# Patient Record
Sex: Female | Born: 1940
Health system: Southern US, Community
[De-identification: ages and names within clinical notes are randomized; demographics above are authoritative.]

## PROBLEM LIST (undated history)

## (undated) ENCOUNTER — Ambulatory Visit (HOSPITAL_COMMUNITY): Admission: EM | Payer: Medicare Other | Source: Home / Self Care

## (undated) DIAGNOSIS — I519 Heart disease, unspecified: Secondary | ICD-10-CM

## (undated) DIAGNOSIS — K219 Gastro-esophageal reflux disease without esophagitis: Secondary | ICD-10-CM

## (undated) DIAGNOSIS — E7800 Pure hypercholesterolemia, unspecified: Secondary | ICD-10-CM

## (undated) DIAGNOSIS — I7090 Unspecified atherosclerosis: Secondary | ICD-10-CM

## (undated) DIAGNOSIS — M1990 Unspecified osteoarthritis, unspecified site: Secondary | ICD-10-CM

## (undated) DIAGNOSIS — I10 Essential (primary) hypertension: Secondary | ICD-10-CM

## (undated) DIAGNOSIS — R748 Abnormal levels of other serum enzymes: Secondary | ICD-10-CM

## (undated) DIAGNOSIS — I4891 Unspecified atrial fibrillation: Secondary | ICD-10-CM

## (undated) DIAGNOSIS — F419 Anxiety disorder, unspecified: Secondary | ICD-10-CM

## (undated) DIAGNOSIS — I499 Cardiac arrhythmia, unspecified: Secondary | ICD-10-CM

## (undated) DIAGNOSIS — I219 Acute myocardial infarction, unspecified: Secondary | ICD-10-CM

## (undated) DIAGNOSIS — I2510 Atherosclerotic heart disease of native coronary artery without angina pectoris: Secondary | ICD-10-CM

## (undated) HISTORY — PX: CARDIAC SURGERY: SHX584

## (undated) HISTORY — DX: Unspecified osteoarthritis, unspecified site: M19.90

## (undated) HISTORY — PX: BACK SURGERY: SHX140

## (undated) HISTORY — DX: Anxiety disorder, unspecified: F41.9

## (undated) HISTORY — PX: ANGIOPLASTY: SHX39

## (undated) HISTORY — PX: NECK SURGERY: SHX720

## (undated) HISTORY — DX: Heart disease, unspecified: I51.9

## (undated) HISTORY — DX: Unspecified atherosclerosis: I70.90

---
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
## 1981-12-06 HISTORY — PX: ABDOMINAL HYSTERECTOMY: SHX81

## 1987-12-07 DIAGNOSIS — I209 Angina pectoris, unspecified: Secondary | ICD-10-CM

## 1987-12-07 HISTORY — DX: Angina pectoris, unspecified: I20.9

## 1998-04-01 ENCOUNTER — Ambulatory Visit (HOSPITAL_COMMUNITY): Admission: RE | Admit: 1998-04-01 | Discharge: 1998-04-01 | Payer: Self-pay | Admitting: Internal Medicine

## 1998-10-31 ENCOUNTER — Emergency Department (HOSPITAL_COMMUNITY): Admission: EM | Admit: 1998-10-31 | Discharge: 1998-10-31 | Payer: Self-pay | Admitting: Emergency Medicine

## 1998-10-31 ENCOUNTER — Encounter: Payer: Self-pay | Admitting: Emergency Medicine

## 1999-11-12 ENCOUNTER — Encounter: Payer: Self-pay | Admitting: Internal Medicine

## 1999-11-12 ENCOUNTER — Ambulatory Visit (HOSPITAL_COMMUNITY): Admission: RE | Admit: 1999-11-12 | Discharge: 1999-11-12 | Payer: Self-pay | Admitting: Internal Medicine

## 2001-04-04 ENCOUNTER — Encounter: Payer: Self-pay | Admitting: Obstetrics and Gynecology

## 2001-04-04 ENCOUNTER — Encounter: Admission: RE | Admit: 2001-04-04 | Discharge: 2001-04-04 | Payer: Self-pay | Admitting: Obstetrics and Gynecology

## 2001-07-10 ENCOUNTER — Ambulatory Visit (HOSPITAL_COMMUNITY): Admission: EM | Admit: 2001-07-10 | Discharge: 2001-07-10 | Payer: Self-pay | Admitting: Emergency Medicine

## 2001-07-10 ENCOUNTER — Encounter: Payer: Self-pay | Admitting: Emergency Medicine

## 2002-04-05 ENCOUNTER — Encounter: Admission: RE | Admit: 2002-04-05 | Discharge: 2002-04-05 | Payer: Self-pay | Admitting: Obstetrics and Gynecology

## 2002-04-05 ENCOUNTER — Encounter: Payer: Self-pay | Admitting: Obstetrics and Gynecology

## 2002-08-07 ENCOUNTER — Other Ambulatory Visit: Admission: RE | Admit: 2002-08-07 | Discharge: 2002-08-07 | Payer: Self-pay | Admitting: Obstetrics and Gynecology

## 2003-08-22 ENCOUNTER — Other Ambulatory Visit: Admission: RE | Admit: 2003-08-22 | Discharge: 2003-08-22 | Payer: Self-pay | Admitting: Obstetrics and Gynecology

## 2003-09-30 ENCOUNTER — Encounter: Admission: RE | Admit: 2003-09-30 | Discharge: 2003-09-30 | Payer: Self-pay | Admitting: Obstetrics and Gynecology

## 2003-09-30 ENCOUNTER — Encounter: Payer: Self-pay | Admitting: Obstetrics and Gynecology

## 2004-09-01 ENCOUNTER — Other Ambulatory Visit: Admission: RE | Admit: 2004-09-01 | Discharge: 2004-09-01 | Payer: Self-pay | Admitting: Obstetrics and Gynecology

## 2005-09-03 ENCOUNTER — Other Ambulatory Visit: Admission: RE | Admit: 2005-09-03 | Discharge: 2005-09-03 | Payer: Self-pay | Admitting: Obstetrics and Gynecology

## 2005-10-25 ENCOUNTER — Encounter: Admission: RE | Admit: 2005-10-25 | Discharge: 2005-10-25 | Payer: Self-pay | Admitting: Obstetrics and Gynecology

## 2006-09-29 ENCOUNTER — Ambulatory Visit (HOSPITAL_COMMUNITY): Admission: RE | Admit: 2006-09-29 | Discharge: 2006-09-29 | Payer: Self-pay | Admitting: Internal Medicine

## 2006-11-07 ENCOUNTER — Encounter: Admission: RE | Admit: 2006-11-07 | Discharge: 2006-11-07 | Payer: Self-pay | Admitting: Internal Medicine

## 2007-10-27 ENCOUNTER — Ambulatory Visit: Payer: Self-pay | Admitting: Psychology

## 2007-11-09 ENCOUNTER — Encounter: Admission: RE | Admit: 2007-11-09 | Discharge: 2007-11-09 | Payer: Self-pay | Admitting: Internal Medicine

## 2007-11-15 ENCOUNTER — Ambulatory Visit: Payer: Self-pay | Admitting: Psychology

## 2008-02-20 ENCOUNTER — Emergency Department (HOSPITAL_COMMUNITY): Admission: EM | Admit: 2008-02-20 | Discharge: 2008-02-20 | Payer: Self-pay | Admitting: Family Medicine

## 2008-09-30 ENCOUNTER — Ambulatory Visit: Payer: Self-pay | Admitting: Obstetrics and Gynecology

## 2008-09-30 ENCOUNTER — Other Ambulatory Visit: Admission: RE | Admit: 2008-09-30 | Discharge: 2008-09-30 | Payer: Self-pay | Admitting: Obstetrics and Gynecology

## 2008-09-30 ENCOUNTER — Encounter: Payer: Self-pay | Admitting: Obstetrics and Gynecology

## 2008-11-26 ENCOUNTER — Encounter: Admission: RE | Admit: 2008-11-26 | Discharge: 2008-11-26 | Payer: Self-pay | Admitting: Obstetrics and Gynecology

## 2009-10-13 ENCOUNTER — Ambulatory Visit: Payer: Self-pay | Admitting: Obstetrics and Gynecology

## 2009-11-21 DIAGNOSIS — K219 Gastro-esophageal reflux disease without esophagitis: Secondary | ICD-10-CM | POA: Insufficient documentation

## 2009-11-21 DIAGNOSIS — F430 Acute stress reaction: Secondary | ICD-10-CM | POA: Insufficient documentation

## 2009-11-24 DIAGNOSIS — G4700 Insomnia, unspecified: Secondary | ICD-10-CM | POA: Insufficient documentation

## 2010-12-27 ENCOUNTER — Encounter: Payer: Self-pay | Admitting: Internal Medicine

## 2011-04-23 NOTE — H&P (Signed)
[place]. [facility]  Patient:    Erica Frank, Erica Frank                         MRN: [id number] Adm. Date:  [id number] Attending:  Osvaldo Human CC:         Erica Frank., M.D.   History and Physical  CHIEF COMPLAINT: Erica Frank is a 70 year old female, with a history of coronary artery disease and hyperlipidemia.  She presents to the emergency room with an episode of chest pain.  HISTORY OF PRESENT ILLNESS: The patient has a history of coronary artery disease, status post PTCA approximately 12 years ago.  She has done fairly well over the years.  This evening around 10 p.m. she developed an episode of chest pain which was relieved with sublingual nitroglycerin.  She awoke again at 1 a.m. and had recurrent chest pain.  This time it took two nitroglycerin and produced only partial relief.  She presented to the emergency room for further evaluation.  The patient ate some hot peppers tonight, otherwise her diet has been unremarkable.  Here in the ER the patient was started on IV heparin and nitroglycerin with fairly good relief of her symptoms.  She denies any PND, orthopnea, or pleuritic chest pain.  CURRENT MEDICATIONS:  1. Lipitor 10 mg q.d.  2. Aspirin q.d.  3. Nitroglycerin as needed.  4. Estrace q.d.  5. Prilosec q.d.  ALLERGIES: No known drug allergies.  PAST MEDICAL HISTORY:  1. Coronary artery disease.  2. Hypercholesterolemia.  3. History of neck surgery due to degenerative joint disease.  4. History of gastroesophageal reflux.  5. Hiatal hernia.  SOCIAL HISTORY: The patient is a nonsmoker.  She drinks alcohol only rarely.  FAMILY HISTORY: Noncontributory.  PHYSICAL EXAMINATION:  GENERAL: She is a middle-aged female in no acute distress.  She is alert and oriented x 3 and her mood and affect at normal.  VITAL SIGNS: Blood pressure 131/77, heart rate 88.  HEENT/NECK: No JVD, 2+ carotids without bruits.  No  thyromegaly.  LUNGS: Clear to auscultation.  HEART: Regular rate, S1 and S2.  No gallops.  No chest wall tenderness.  ABDOMEN: Benign.  Good bowel sounds.  Nontender.  No hepatosplenomegaly.  She is only very minimally tender in her upper epigastrium.  EXTREMITIES: No calf tenderness.  No swelling.  No clubbing, cyanosis, or edema.  NEUROLOGIC: Cranial nerves 2-12 intact.  Motor and sensory function intact.  LABORATORY DATA: EKG reveals normal sinus rhythm, some T wave flattening in lead aVF; otherwise EKG normal.  Cardiac enzymes, CPK 145, MB 2.2.  Troponin less than 0.01.  IMPRESSION: Erica Frank presents with some rather atypical episodes of chest pain.  Her initial episode was relieved with sublingual nitroglycerin.  She currently is stable and does not have any acute electrocardiogram changes.  PLAN: We will admit her for observation.  We will continue the IV nitroglycerin and IV heparin.  We will collect cardiac enzymes.  Dr. Donnie Aho will see her in the morning.  All of her other medical problems are stable. DD:  07/10/01 TD:  07/10/01 Job: [id number]

## 2011-04-23 NOTE — Cardiovascular Report (Signed)
[place]. [facility]  Patient:    Erica Frank, Erica Frank                         MRN: [id number] Proc. Date: 07/10/01 Adm. Date:  [id number] Disc. Date: [id number] Attending:  Norman Clay CC:         Marinus Maw, M.D.   Cardiac Catheterization  HISTORY:  The patient is a 70 year old female with a history of known coronary disease, who presents with a chest pain syndrome consistent with unstable angina.  COMMENTS ABOUT PROCEDURE:  The patient tolerated the procedure well without complications and had good hemostasis and pedal pulses following the procedure.  Please see the attached catheterization log for the remainder of the details.  HEMODYNAMIC DATA:  Aorta post contrast 148/74, LV post contrast 148/13.  ANGIOGRAPHIC DATA:  LEFT VENTRICULOGRAM:  The left ventriculogram performed in the 30 degree RAO projection.  There was some catheter-induced ventricular ectopy and ventricular tachycardia.  The estimated EF was around 50% but was difficult. Coronary arteries arise and distribute normally.  Calcification is noted in the proximal left coronary artery.  The left main coronary artery is normal.  The left anterior descending is calcified proximally.  There is a mild 20-30% proximal stenosis but no severe focal obstructive stenoses are noted. The left anterior descending is a large vessel.  Circumflex coronary artery supplies a large intermediate or first marginal branch and a second marginal branch which is free of disease.  The right coronary artery has an enlarged posterior descending artery and a smaller continuation branch supplying two posterolateral branches which is free of disease.  IMPRESSION: 1. Mild coronary artery disease predominately involving the proximal    left anterior descending. 2. Normal left ventricular function. DD:  07/10/01 TD:  07/11/01 Job: [id number]

## 2011-09-11 ENCOUNTER — Emergency Department (HOSPITAL_COMMUNITY): Payer: Medicare Other

## 2011-09-11 ENCOUNTER — Emergency Department (HOSPITAL_COMMUNITY)
Admission: EM | Admit: 2011-09-11 | Discharge: 2011-09-11 | Disposition: A | Discharge status: Home / Self Care | Payer: Medicare Other | Attending: Emergency Medicine | Admitting: Emergency Medicine

## 2011-09-11 DIAGNOSIS — I2510 Atherosclerotic heart disease of native coronary artery without angina pectoris: Secondary | ICD-10-CM | POA: Insufficient documentation

## 2011-09-11 DIAGNOSIS — R0602 Shortness of breath: Secondary | ICD-10-CM | POA: Insufficient documentation

## 2011-09-11 DIAGNOSIS — R5383 Other fatigue: Secondary | ICD-10-CM | POA: Insufficient documentation

## 2011-09-11 DIAGNOSIS — R42 Dizziness and giddiness: Secondary | ICD-10-CM | POA: Insufficient documentation

## 2011-09-11 DIAGNOSIS — R5381 Other malaise: Secondary | ICD-10-CM | POA: Insufficient documentation

## 2011-09-11 DIAGNOSIS — I498 Other specified cardiac arrhythmias: Secondary | ICD-10-CM | POA: Insufficient documentation

## 2011-09-11 DIAGNOSIS — E860 Dehydration: Secondary | ICD-10-CM | POA: Insufficient documentation

## 2011-09-11 LAB — COMPREHENSIVE METABOLIC PANEL
ALT: 18 U/L (ref 0–35)
AST: 19 U/L (ref 0–37)
Albumin: 3.3 g/dL — ABNORMAL LOW (ref 3.5–5.2)
Alkaline Phosphatase: 52 U/L (ref 39–117)
Potassium: 3.9 mEq/L (ref 3.5–5.1)
Sodium: 139 mEq/L (ref 135–145)
Total Protein: 6.1 g/dL (ref 6.0–8.3)

## 2011-09-11 LAB — DIFFERENTIAL
Basophils Absolute: 0.1 10*3/uL (ref 0.0–0.1)
Lymphocytes Relative: 41 % (ref 12–46)
Monocytes Absolute: 0.4 10*3/uL (ref 0.1–1.0)
Neutro Abs: 3 10*3/uL (ref 1.7–7.7)

## 2011-09-11 LAB — PRO B NATRIURETIC PEPTIDE: Pro B Natriuretic peptide (BNP): 49.3 pg/mL (ref 0–125)

## 2011-09-11 LAB — CBC
HCT: 38.5 % (ref 36.0–46.0)
Hemoglobin: 13.2 g/dL (ref 12.0–15.0)
WBC: 6.3 10*3/uL (ref 4.0–10.5)

## 2011-09-11 LAB — POCT I-STAT TROPONIN I: Troponin i, poc: 0 ng/mL (ref 0.00–0.08)

## 2011-09-11 LAB — URINALYSIS, ROUTINE W REFLEX MICROSCOPIC
Glucose, UA: NEGATIVE mg/dL
Hgb urine dipstick: NEGATIVE
Specific Gravity, Urine: 1.01 (ref 1.005–1.030)
Urobilinogen, UA: 0.2 mg/dL (ref 0.0–1.0)

## 2011-09-11 LAB — CK TOTAL AND CKMB (NOT AT ARMC)
CK, MB: 1.7 ng/mL (ref 0.3–4.0)
Relative Index: INVALID (ref 0.0–2.5)

## 2011-09-13 LAB — GLUCOSE, CAPILLARY: Glucose-Capillary: 157 mg/dL — ABNORMAL HIGH (ref 70–99)

## 2013-09-13 ENCOUNTER — Other Ambulatory Visit: Payer: Self-pay

## 2013-09-13 DIAGNOSIS — Z1231 Encounter for screening mammogram for malignant neoplasm of breast: Secondary | ICD-10-CM

## 2013-10-10 ENCOUNTER — Ambulatory Visit
Admission: RE | Admit: 2013-10-10 | Discharge: 2013-10-10 | Disposition: A | Discharge status: Home / Self Care | Payer: Medicare Other | Source: Ambulatory Visit

## 2013-10-10 DIAGNOSIS — Z1231 Encounter for screening mammogram for malignant neoplasm of breast: Secondary | ICD-10-CM

## 2014-03-08 ENCOUNTER — Emergency Department (INDEPENDENT_AMBULATORY_CARE_PROVIDER_SITE_OTHER)
Admission: EM | Admit: 2014-03-08 | Discharge: 2014-03-08 | Disposition: A | Discharge status: Other Acute Inpatient Hospital | Payer: Medicare Other | Source: Home / Self Care

## 2014-03-08 ENCOUNTER — Emergency Department (HOSPITAL_COMMUNITY)
Admission: EM | Admit: 2014-03-08 | Discharge: 2014-03-08 | Disposition: A | Discharge status: Home / Self Care | Payer: Medicare Other | Attending: Emergency Medicine | Admitting: Emergency Medicine

## 2014-03-08 ENCOUNTER — Encounter (HOSPITAL_COMMUNITY): Payer: Self-pay | Admitting: Emergency Medicine

## 2014-03-08 DIAGNOSIS — I498 Other specified cardiac arrhythmias: Secondary | ICD-10-CM

## 2014-03-08 DIAGNOSIS — R5383 Other fatigue: Secondary | ICD-10-CM

## 2014-03-08 DIAGNOSIS — Z79899 Other long term (current) drug therapy: Secondary | ICD-10-CM | POA: Insufficient documentation

## 2014-03-08 DIAGNOSIS — M6281 Muscle weakness (generalized): Secondary | ICD-10-CM | POA: Insufficient documentation

## 2014-03-08 DIAGNOSIS — R55 Syncope and collapse: Secondary | ICD-10-CM

## 2014-03-08 DIAGNOSIS — Z7982 Long term (current) use of aspirin: Secondary | ICD-10-CM | POA: Insufficient documentation

## 2014-03-08 DIAGNOSIS — R531 Weakness: Secondary | ICD-10-CM

## 2014-03-08 DIAGNOSIS — R5381 Other malaise: Secondary | ICD-10-CM

## 2014-03-08 DIAGNOSIS — R001 Bradycardia, unspecified: Secondary | ICD-10-CM

## 2014-03-08 LAB — CBC WITH DIFFERENTIAL/PLATELET
BASOS ABS: 0.1 10*3/uL (ref 0.0–0.1)
Basophils Relative: 1 % (ref 0–1)
EOS ABS: 0.2 10*3/uL (ref 0.0–0.7)
Eosinophils Relative: 2 % (ref 0–5)
HCT: 41.9 % (ref 36.0–46.0)
Hemoglobin: 14.5 g/dL (ref 12.0–15.0)
LYMPHS ABS: 1.6 10*3/uL (ref 0.7–4.0)
Lymphocytes Relative: 25 % (ref 12–46)
MCH: 32.2 pg (ref 26.0–34.0)
MCHC: 34.6 g/dL (ref 30.0–36.0)
MCV: 93.1 fL (ref 78.0–100.0)
Monocytes Absolute: 0.7 10*3/uL (ref 0.1–1.0)
Monocytes Relative: 11 % (ref 3–12)
Neutro Abs: 4 10*3/uL (ref 1.7–7.7)
Neutrophils Relative %: 61 % (ref 43–77)
PLATELETS: 245 10*3/uL (ref 150–400)
RBC: 4.5 MIL/uL (ref 3.87–5.11)
RDW: 14.5 % (ref 11.5–15.5)
WBC: 6.6 10*3/uL (ref 4.0–10.5)

## 2014-03-08 LAB — BASIC METABOLIC PANEL
BUN: 20 mg/dL (ref 6–23)
CO2: 25 mEq/L (ref 19–32)
Calcium: 9.6 mg/dL (ref 8.4–10.5)
Chloride: 103 mEq/L (ref 96–112)
Creatinine, Ser: 0.89 mg/dL (ref 0.50–1.10)
GFR, EST AFRICAN AMERICAN: 73 mL/min — AB (ref >90)
GFR, EST NON AFRICAN AMERICAN: 63 mL/min — AB (ref >90)
GLUCOSE: 93 mg/dL (ref 70–99)
POTASSIUM: 4.6 meq/L (ref 3.7–5.3)
Sodium: 141 mEq/L (ref 137–147)

## 2014-03-08 LAB — URINALYSIS, ROUTINE W REFLEX MICROSCOPIC
Bilirubin Urine: NEGATIVE
Glucose, UA: NEGATIVE mg/dL
HGB URINE DIPSTICK: NEGATIVE
Ketones, ur: NEGATIVE mg/dL
Leukocytes, UA: NEGATIVE
NITRITE: NEGATIVE
Protein, ur: NEGATIVE mg/dL
Specific Gravity, Urine: 1.014 (ref 1.005–1.030)
UROBILINOGEN UA: 0.2 mg/dL (ref 0.0–1.0)
pH: 5 (ref 5.0–8.0)

## 2014-03-08 MED ORDER — SODIUM CHLORIDE 0.9 % IV BOLUS (SEPSIS)
500.0000 mL | Freq: Once | INTRAVENOUS | Status: AC
Start: 2014-03-08 — End: 2014-03-08
  Administered 2014-03-08: 500 mL via INTRAVENOUS

## 2014-03-08 MED ORDER — SODIUM CHLORIDE 0.9 % IV SOLN
INTRAVENOUS | Status: DC
Start: 2014-03-08 — End: 2014-03-08

## 2014-03-08 NOTE — Discharge Instructions (Signed)
There was no clear cause, for your weakness. Get plenty of rest, drink a lot of fluids, and be careful when you stand up.     Weakness Weakness is a lack of strength. It may be felt all over the body (generalized) or in one specific part of the body (focal). Some causes of weakness can be serious. You may need further medical evaluation, especially if you are elderly or you have a history of immunosuppression (such as chemotherapy or HIV), kidney disease, heart disease, or diabetes. CAUSES  Weakness can be caused by many different things, including:  Infection.  Physical exhaustion.  Internal bleeding or other blood loss that results in a lack of red blood cells (anemia).  Dehydration. This cause is more common in elderly people.  Side effects or electrolyte abnormalities from medicines, such as pain medicines or sedatives.  Emotional distress, anxiety, or depression.  Circulation problems, especially severe peripheral arterial disease.  Heart disease, such as rapid atrial fibrillation, bradycardia, or heart failure.  Nervous system disorders, such as Guillain-Barr syndrome, multiple sclerosis, or stroke. DIAGNOSIS  To find the cause of your weakness, your caregiver will take your history and perform a physical exam. Lab tests or X-rays may also be ordered, if needed. TREATMENT  Treatment of weakness depends on the cause of your symptoms and can vary greatly. HOME CARE INSTRUCTIONS   Rest as needed.  Eat a well-balanced diet.  Try to get some exercise every day.  Only take over-the-counter or prescription medicines as directed by your caregiver. SEEK MEDICAL CARE IF:   Your weakness seems to be getting worse or spreads to other parts of your body.  You develop new aches or pains. SEEK IMMEDIATE MEDICAL CARE IF:   You cannot perform your normal daily activities, such as getting dressed and feeding yourself.  You cannot walk up and down stairs, or you feel exhausted  when you do so.  You have shortness of breath or chest pain.  You have difficulty moving parts of your body.  You have weakness in only one area of the body or on only one side of the body.  You have a fever.  You have trouble speaking or swallowing.  You cannot control your bladder or bowel movements.  You have black or bloody vomit or stools. MAKE SURE YOU:  Understand these instructions.  Will watch your condition.  Will get help right away if you are not doing well or get worse. Document Released: 11/22/2005 Document Revised: 05/23/2012 Document Reviewed: 01/21/2012 [facility] Patient Information 2014 [place], [place].

## 2014-03-08 NOTE — ED Notes (Addendum)
Pt c/o weakness that has been going on for about a week and this morning around 0900 stated that she felt like she was going to pass out. Stated that she was at [facility] a couple of years ago for dehydration and felt similar to what she did this morning. Denies any pain, or sudden change in vision.

## 2014-03-08 NOTE — ED Provider Notes (Signed)
CSN: [id number]     Arrival date & time 03/08/14  0940 History   First MD Initiated Contact with Patient 03/08/14 1024     Chief Complaint  Patient presents with  . Weakness   (Consider location/radiation/quality/duration/timing/severity/associated sxs/prior Treatment) HPI Comments: 73 year old female presents to the urgent care with a complaint of progressive weakness over the past week. She states that she feels that her legs are weak and are going to give out on her. She now feels that the weakness is ascending upper body. This morning she felt as though she were going to pass out. That lasted for several minutes however spontaneously resolved. She has had no chest discomfort or dyspnea since the last week's episode. Denies shortness of breath, nausea, vomiting, diaphoresis.   Past Medical History  Diagnosis Date  . Heart attack    Past Surgical History  Procedure Laterality Date  . Angioplasty     History reviewed. No pertinent family history. History  Substance Use Topics  . Smoking status: Never Smoker   . Smokeless tobacco: Not on file  . Alcohol Use: No   OB History   Grav Para Term Preterm Abortions TAB SAB Ect Mult Living                 Review of Systems  Constitutional: Positive for activity change and fatigue. Negative for fever.  HENT: Negative.   Eyes: Positive for visual disturbance.  Respiratory: Negative.   Cardiovascular: Negative for chest pain, palpitations and leg swelling.  Gastrointestinal: Negative.   Genitourinary: Negative.   Skin: Negative.   Neurological: Positive for weakness and light-headedness.  Psychiatric/Behavioral: The patient is nervous/anxious.     Allergies  Review of patient's allergies indicates no known allergies.  Home Medications   Current Outpatient Rx  Name  Route  Sig  Dispense  Refill  . aspirin 81 MG tablet   Oral   Take 81 mg by mouth daily.         Marland Kitchen. ezetimibe-simvastatin (VYTORIN) 10-20 MG per tablet    Oral   Take 1 tablet by mouth daily.          BP 174/89  Temp(Src) 97.9 F (36.6 C) (Oral)  Resp 16  SpO2 97% Physical Exam  Nursing note and vitals reviewed. Constitutional: She is oriented to person, place, and time. She appears well-developed and well-nourished. No distress.  St feels generally well. Appears well, no distress, relaxed posturing, no current sx's while supine. Nl speech and content.  Eyes: Conjunctivae and EOM are normal. Pupils are equal, round, and reactive to light.  Neck: Normal range of motion. Neck supple.  Cardiovascular: Normal heart sounds.   Bradycardia at 55  Pulmonary/Chest: Effort normal and breath sounds normal. No respiratory distress. She has no wheezes. She has no rales.  Abdominal: Soft. There is no tenderness.  Musculoskeletal: Normal range of motion. She exhibits no edema and no tenderness.  No calf tenderness, pain or swelling. Nl color and warmth.  Pedal pulses 2+.  Lymphadenopathy:    She has no cervical adenopathy.  Neurological: She is alert and oriented to person, place, and time.  Skin: Skin is warm and dry. She is not diaphoretic.  Psychiatric: She has a normal mood and affect.    ED Course  Procedures (including critical care time) Labs Review Labs Reviewed - No data to display Imaging Review No results found. EKG: sinus brady 55. T wave inversion V3, unchanged form 09/11/11. No ectopy.   MDM  1. General weakness   2. Near syncope   3. Sinus bradycardia     Transfer for evaluation of progressive weakness and feelings of impending syncope over the past week, worse today. No chest pain today.  Had a short episode of chest pain 1 week ago while working in garden, none since   Hayden Rasmussen, NP 03/08/14 1048  Hayden Rasmussen, NP 03/08/14 1049

## 2014-03-08 NOTE — ED Provider Notes (Signed)
Medical screening examination/treatment/procedure(s) were performed by resident physician or non-physician practitioner and as supervising physician I was immediately available for consultation/collaboration.   Kashius Dominic DOUGLAS MD.   Hollyn Stucky D Mirta Mally, MD 03/08/14 1059 

## 2014-03-08 NOTE — ED Notes (Signed)
C/o she has not felt well x 1 week, "weak". Denies pain at present, but had brief episode of pain in chest last week while in garden, but resolved w/o medication. Reported prior hist of angioplasty

## 2014-03-08 NOTE — ED Provider Notes (Signed)
CSN: [id number]     Arrival date & time 03/08/14  1059 History   First MD Initiated Contact with Patient 03/08/14 1132     Chief Complaint  Patient presents with  . Weakness     (Consider location/radiation/quality/duration/timing/severity/associated sxs/prior Treatment) Patient is a 73 y.o. female presenting with weakness. The history is provided by the patient.  Weakness   She presents for evaluation of a weak feeling in both legs. Symptoms started one week ago and are persistent. She notices the weakness after she is standing for a few minutes, when, her legs began to "buckle." Today, the symptom worsened and was accompanied by a sensation of dizziness. Because of this she came to the urgent care for assessment. After getting an EKG there, that was reassuring, she was transferred here for further evaluation. The patient denies headache, fever, chills, nausea, vomiting, dysuria, urinary frequency, constipation, stool, incontinence, or urinary incontinence. She does not have or abdominal pain. She's taking her usual medications. There are no other known modifying factors.  History reviewed. No pertinent past medical history. Past Surgical History  Procedure Laterality Date  . Angioplasty    . Abdominal hysterectomy    . Back surgery     No family history on file. History  Substance Use Topics  . Smoking status: Never Smoker   . Smokeless tobacco: Not on file  . Alcohol Use: Yes     Comment: "glass of wine every now and then"   OB History   Grav Para Term Preterm Abortions TAB SAB Ect Mult Living                 Review of Systems  Neurological: Positive for weakness.  All other systems reviewed and are negative.      Allergies  Review of patient's allergies indicates no known allergies.  Home Medications   Current Outpatient Rx  Name  Route  Sig  Dispense  Refill  . aspirin EC 325 MG tablet   Oral   Take 162.5 mg by mouth at bedtime.         Marland Kitchen.  ezetimibe-simvastatin (VYTORIN) 10-20 MG per tablet   Oral   Take 1 tablet by mouth.          . Naproxen Sodium (ALEVE PO)   Oral   Take 2 tablets by mouth daily as needed (pain).          BP 163/75  Pulse 50  Temp(Src) 97.4 F (36.3 C) (Oral)  Resp 18  Ht 5\' 2"  (1.575 m)  Wt 145 lb (65.772 kg)  BMI 26.51 kg/m2  SpO2 100% Physical Exam  Nursing note and vitals reviewed. Constitutional: She is oriented to person, place, and time. She appears well-developed.  Elderly, vigorous  HENT:  Head: Normocephalic and atraumatic.  Eyes: Conjunctivae and EOM are normal. Pupils are equal, round, and reactive to light.  Neck: Normal range of motion and phonation normal. Neck supple.  Cardiovascular: Normal rate, regular rhythm and intact distal pulses.   Pulmonary/Chest: Effort normal and breath sounds normal. She exhibits no tenderness.  Abdominal: Soft. She exhibits no distension. There is no tenderness. There is no guarding.  Musculoskeletal: Normal range of motion. She exhibits no edema and no tenderness.  No calf tenderness. No asymmetry.  Neurological: She is alert and oriented to person, place, and time. She exhibits normal muscle tone.  Skin: Skin is warm and dry.  Psychiatric: She has a normal mood and affect. Her behavior is normal. Judgment and  thought content normal.    ED Course  Procedures (including critical care time)  Medications  0.9 %  sodium chloride infusion ( Intravenous Not Given 03/08/14 1453)  sodium chloride 0.9 % bolus 500 mL (0 mLs Intravenous Stopped 03/08/14 1453)    Patient Vitals for the past 24 hrs:  BP Temp Temp src Pulse Resp SpO2 Height Weight  03/08/14 1454 163/75 mmHg - - - 18 100 % - -  03/08/14 1430 155/80 mmHg - - 50 17 100 % - -  03/08/14 1400 143/85 mmHg - - 54 25 97 % - -  03/08/14 1347 157/76 mmHg - - 57 - - - -  03/08/14 1345 151/77 mmHg - - 50 - - - -  03/08/14 1343 144/73 mmHg - - 51 - - - -  03/08/14 1330 160/77 mmHg - - 54 22 100  % - -  03/08/14 1300 144/74 mmHg - - 46 17 99 % - -  03/08/14 1246 146/86 mmHg 97.4 F (36.3 C) Oral 50 18 98 % - -  03/08/14 1230 135/80 mmHg - - 56 18 97 % - -  03/08/14 1228 - - - 60 - - - -  03/08/14 1200 146/78 mmHg - - 50 16 97 % - -  03/08/14 1130 149/76 mmHg - - 50 16 96 % - -  03/08/14 1111 151/87 mmHg 97.5 F (36.4 C) Oral 52 18 99 % 5\' 2"  (1.575 m) 145 lb (65.772 kg)    3:02 PM Reevaluation with update and discussion. After initial assessment and treatment, an updated evaluation reveals she is comfortable, she did not get weak or dizzy when standing. She has tolerated oral fluids.Mancel Bale L   Labs Review Labs Reviewed  BASIC METABOLIC PANEL - Abnormal; Notable for the following:    GFR calc non Af Amer 63 (*)    GFR calc Af Amer 73 (*)    All other components within normal limits  URINE CULTURE  CBC WITH DIFFERENTIAL  URINALYSIS, ROUTINE W REFLEX MICROSCOPIC   Imaging Review No results found.   EKG Interpretation None      MDM   Final diagnoses:  Weakness    Nonspecific weakness, with negative ED evaluation. Dpubt metabolic instability, serious bacterial infection or impending vascular collapse.  Nursing Notes Reviewed/ Care Coordinated Applicable Imaging Reviewed Interpretation of Laboratory Data incorporated into ED treatment  The patient appears reasonably screened and/or stabilized for discharge and I doubt any other medical condition or other [facility] requiring further screening, evaluation, or treatment in the ED at this time prior to discharge.  Plan: Home Medications- usual; Home Treatments- rest, increase oral intake ; return here if the recommended treatment, does not improve the symptoms; Recommended follow up- PCP 1 week    Flint Melter, MD 03/08/14 1504

## 2014-03-09 LAB — URINE CULTURE
Colony Count: NO GROWTH
Culture: NO GROWTH

## 2014-12-02 ENCOUNTER — Other Ambulatory Visit: Payer: Self-pay

## 2014-12-02 DIAGNOSIS — Z1231 Encounter for screening mammogram for malignant neoplasm of breast: Secondary | ICD-10-CM

## 2014-12-12 ENCOUNTER — Ambulatory Visit: Payer: Medicare Other

## 2014-12-31 ENCOUNTER — Ambulatory Visit
Admission: RE | Admit: 2014-12-31 | Discharge: 2014-12-31 | Disposition: A | Discharge status: Home / Self Care | Payer: Medicare Other | Source: Ambulatory Visit

## 2014-12-31 DIAGNOSIS — Z1231 Encounter for screening mammogram for malignant neoplasm of breast: Secondary | ICD-10-CM

## 2015-01-14 ENCOUNTER — Encounter: Payer: Self-pay | Admitting: Gynecology

## 2015-01-14 ENCOUNTER — Ambulatory Visit (INDEPENDENT_AMBULATORY_CARE_PROVIDER_SITE_OTHER): Payer: Medicare Other | Admitting: Gynecology

## 2015-01-14 VITALS — BP 120/78 | Ht 62.0 in | Wt 152.0 lb

## 2015-01-14 DIAGNOSIS — N318 Other neuromuscular dysfunction of bladder: Secondary | ICD-10-CM

## 2015-01-14 DIAGNOSIS — N952 Postmenopausal atrophic vaginitis: Secondary | ICD-10-CM

## 2015-01-14 DIAGNOSIS — Z01419 Encounter for gynecological examination (general) (routine) without abnormal findings: Secondary | ICD-10-CM

## 2015-01-14 DIAGNOSIS — Z780 Asymptomatic menopausal state: Secondary | ICD-10-CM

## 2015-01-14 DIAGNOSIS — N3281 Overactive bladder: Secondary | ICD-10-CM

## 2015-01-14 NOTE — Progress Notes (Signed)
Erica Frank [birth year] [id number]        74 y.o.  G1P1 for breast and pelvic exam. Has not been in the office for a number of years. Former patient of Dr. Eda Paschal. Several issues noted below.  Past medical history,surgical history, problem list, medications, allergies, family history and social history were all reviewed and documented as reviewed in the EPIC chart.  ROS:  Performed with pertinent positives and negatives included in the history, assessment and plan.   Additional significant findings :  none   Exam: [name] Vitals:   01/14/15 1506  BP: 120/78  Height: 5\' 2"  (1.575 m)  Weight: 152 lb (68.947 kg)   General appearance:  Normal affect, orientation and appearance. Skin: Grossly normal HEENT: Without gross lesions.  No cervical or supraclavicular adenopathy. Thyroid normal.  Lungs:  Clear without wheezing, rales or rhonchi Cardiac: RR, without RMG Abdominal:  Soft, nontender, without masses, guarding, rebound, organomegaly or hernia Breasts:  Examined lying and sitting without masses, retractions, discharge or axillary adenopathy. Pelvic:  Ext/BUS/vagina with generalized atrophic changes.  Adnexa  Without masses or tenderness    Anus and perineum  Normal   Rectovaginal  Normal sphincter tone without palpated masses or tenderness.    Assessment/Plan:  74 y.o. G1P1 female for breast and pelvic exam.   1. Postmenopausal/atrophic genital changes. Status post TAH Burch procedure for endometriosis and urinary incontinence.  Without significant hot flushes, night sweats, vaginal dryness or dyspareunia. Continue to monitor report any issues. 2. Detrusor instability. Patient has a multiple year history of detrusor instability with urgency type symptoms. No significant incontinence. No significant stress symptoms. Options to include behavior modification as well as education options reviewed. Side effect profile with the medications discussed. Patient's going to try  OTC Oxytrol patch. Will call if she wants to move onto oral medications. 3. DEXA 2006 normal. Check DEXA now patient will schedule. Increased calcium vitamin D reviewed. 4. Mammography 12/2014. Continue with annual mammography. SBE monthly reviewed. 5. Pap smear 2009. No Pap smear done today. No history of abnormal Pap smears. Status post hysterectomy for benign indications. We both agreed to stop screening per current screening guidelines and she is comfortable with this. 6. Colonoscopy within 10 years. Planned repeat interval 10 years per her history. 7. Health maintenance. No routine blood work done as she reports this done at her primary physician's office. Follow up in one year, sooner as needed.     Dara Lords MD, 3:36 PM 01/14/2015

## 2015-01-14 NOTE — Patient Instructions (Signed)
Try the over the counter Oxytrol patch for your bladder.  You may obtain a copy of any labs that were done today by logging onto MyChart as outlined in the instructions provided with your AVS (after visit summary). The office will not call with normal lab results but certainly if there are any significant abnormalities then we will contact you.   Health Maintenance, Female A healthy lifestyle and preventative care can promote health and wellness.  Maintain regular health, dental, and eye exams.  Eat a healthy diet. Foods like vegetables, fruits, whole grains, low-fat dairy products, and lean protein foods contain the nutrients you need without too many calories. Decrease your intake of foods high in solid fats, added sugars, and salt. Get information about a proper diet from your caregiver, if necessary.  Regular physical exercise is one of the most important things you can do for your health. Most adults should get at least 150 minutes of moderate-intensity exercise (any activity that increases your heart rate and causes you to sweat) each week. In addition, most adults need muscle-strengthening exercises on 2 or more days a week.   Maintain a healthy weight. The body mass index (BMI) is a screening tool to identify possible weight problems. It provides an estimate of body fat based on height and weight. Your caregiver can help determine your BMI, and can help you achieve or maintain a healthy weight. For adults 20 years and older:  A BMI below 18.5 is considered underweight.  A BMI of 18.5 to 24.9 is normal.  A BMI of 25 to 29.9 is considered overweight.  A BMI of 30 and above is considered obese.  Maintain normal blood lipids and cholesterol by exercising and minimizing your intake of saturated fat. Eat a balanced diet with plenty of fruits and vegetables. Blood tests for lipids and cholesterol should begin at age 69 and be repeated every 5 years. If your lipid or cholesterol levels are  high, you are over 50, or you are a high risk for heart disease, you may need your cholesterol levels checked more frequently.Ongoing high lipid and cholesterol levels should be treated with medicines if diet and exercise are not effective.  If you smoke, find out from your caregiver how to quit. If you do not use tobacco, do not start.  Lung cancer screening is recommended for adults aged 86 80 years who are at high risk for developing lung cancer because of a history of smoking. Yearly low-dose computed tomography (CT) is recommended for people who have at least a 30-pack-year history of smoking and are a current smoker or have quit within the past 15 years. A pack year of smoking is smoking an average of 1 pack of cigarettes a day for 1 year (for example: 1 pack a day for 30 years or 2 packs a day for 15 years). Yearly screening should continue until the smoker has stopped smoking for at least 15 years. Yearly screening should also be stopped for people who develop a health problem that would prevent them from having lung cancer treatment.  If you are pregnant, do not drink alcohol. If you are breastfeeding, be very cautious about drinking alcohol. If you are not pregnant and choose to drink alcohol, do not exceed 1 drink per day. One drink is considered to be 12 ounces (355 mL) of beer, 5 ounces (148 mL) of wine, or 1.5 ounces (44 mL) of liquor.  Avoid use of street drugs. Do not share needles with anyone.  Ask for help if you need support or instructions about stopping the use of drugs.  High blood pressure causes heart disease and increases the risk of stroke. Blood pressure should be checked at least every 1 to 2 years. Ongoing high blood pressure should be treated with medicines, if weight loss and exercise are not effective.  If you are 88 to 74 years old, ask your caregiver if you should take aspirin to prevent strokes.  Diabetes screening involves taking a blood sample to check your fasting  blood sugar level. This should be done once every 3 years, after age 77, if you are within normal weight and without risk factors for diabetes. Testing should be considered at a younger age or be carried out more frequently if you are overweight and have at least 1 risk factor for diabetes.  Breast cancer screening is essential preventative care for women. You should practice "breast self-awareness." This means understanding the normal appearance and feel of your breasts and may include breast self-examination. Any changes detected, no matter how small, should be reported to a caregiver. Women in their 26s and 30s should have a clinical breast exam (CBE) by a caregiver as part of a regular health exam every 1 to 3 years. After age 32, women should have a CBE every year. Starting at age 34, women should consider having a mammogram (breast X-ray) every year. Women who have a family history of breast cancer should talk to their caregiver about genetic screening. Women at a high risk of breast cancer should talk to their caregiver about having an MRI and a mammogram every year.  Breast cancer gene (BRCA)-related cancer risk assessment is recommended for women who have family members with BRCA-related cancers. BRCA-related cancers include breast, ovarian, tubal, and peritoneal cancers. Having family members with these cancers may be associated with an increased risk for harmful changes (mutations) in the breast cancer genes BRCA1 and BRCA2. Results of the assessment will determine the need for genetic counseling and BRCA1 and BRCA2 testing.  The Pap test is a screening test for cervical cancer. Women should have a Pap test starting at age 28. Between ages 48 and 67, Pap tests should be repeated every 2 years. Beginning at age 20, you should have a Pap test every 3 years as long as the past 3 Pap tests have been normal. If you had a hysterectomy for a problem that was not cancer or a condition that could lead to  cancer, then you no longer need Pap tests. If you are between ages 55 and 63, and you have had normal Pap tests going back 10 years, you no longer need Pap tests. If you have had past treatment for cervical cancer or a condition that could lead to cancer, you need Pap tests and screening for cancer for at least 20 years after your treatment. If Pap tests have been discontinued, risk factors (such as a new sexual partner) need to be reassessed to determine if screening should be resumed. Some women have medical problems that increase the chance of getting cervical cancer. In these cases, your caregiver may recommend more frequent screening and Pap tests.  The human papillomavirus (HPV) test is an additional test that may be used for cervical cancer screening. The HPV test looks for the virus that can cause the cell changes on the cervix. The cells collected during the Pap test can be tested for HPV. The HPV test could be used to screen women aged 28 years  and older, and should be used in women of any age who have unclear Pap test results. After the age of 85, women should have HPV testing at the same frequency as a Pap test.  Colorectal cancer can be detected and often prevented. Most routine colorectal cancer screening begins at the age of 61 and continues through age 55. However, your caregiver may recommend screening at an earlier age if you have risk factors for colon cancer. On a yearly basis, your caregiver may provide home test kits to check for hidden blood in the stool. Use of a small camera at the end of a tube, to directly examine the colon (sigmoidoscopy or colonoscopy), can detect the earliest forms of colorectal cancer. Talk to your caregiver about this at age 56, when routine screening begins. Direct examination of the colon should be repeated every 5 to 10 years through age 20, unless early forms of pre-cancerous polyps or small growths are found.  Hepatitis C blood testing is recommended for  all people born from [birth year] through 1965 and any individual with known risks for hepatitis C.  Practice safe sex. Use condoms and avoid high-risk sexual practices to reduce the spread of sexually transmitted infections (STIs). Sexually active women aged 68 and younger should be checked for Chlamydia, which is a common sexually transmitted infection. Older women with new or multiple partners should also be tested for Chlamydia. Testing for other STIs is recommended if you are sexually active and at increased risk.  Osteoporosis is a disease in which the bones lose minerals and strength with aging. This can result in serious bone fractures. The risk of osteoporosis can be identified using a bone density scan. Women ages 42 and over and women at risk for fractures or osteoporosis should discuss screening with their caregivers. Ask your caregiver whether you should be taking a calcium supplement or vitamin D to reduce the rate of osteoporosis.  Menopause can be associated with physical symptoms and risks. Hormone replacement therapy is available to decrease symptoms and risks. You should talk to your caregiver about whether hormone replacement therapy is right for you.  Use sunscreen. Apply sunscreen liberally and repeatedly throughout the day. You should seek shade when your shadow is shorter than you. Protect yourself by wearing long sleeves, pants, a wide-brimmed hat, and sunglasses year round, whenever you are outdoors.  Notify your caregiver of new moles or changes in moles, especially if there is a change in shape or color. Also notify your caregiver if a mole is larger than the size of a pencil eraser.  Stay current with your immunizations. Document Released: 06/07/2011 Document Revised: 03/19/2013 Document Reviewed: 06/07/2011 [facility] Patient Information 2014 [place].

## 2015-01-15 LAB — URINALYSIS W MICROSCOPIC + REFLEX CULTURE
Bacteria, UA: NONE SEEN
Bilirubin Urine: NEGATIVE
Casts: NONE SEEN
Crystals: NONE SEEN
GLUCOSE, UA: NEGATIVE mg/dL
HGB URINE DIPSTICK: NEGATIVE
Ketones, ur: NEGATIVE mg/dL
Leukocytes, UA: NEGATIVE
Nitrite: NEGATIVE
Protein, ur: NEGATIVE mg/dL
SPECIFIC GRAVITY, URINE: 1.007 (ref 1.005–1.030)
SQUAMOUS EPITHELIAL / LPF: NONE SEEN
Urobilinogen, UA: 0.2 mg/dL (ref 0.0–1.0)
pH: 6 (ref 5.0–8.0)

## 2015-01-24 ENCOUNTER — Emergency Department (HOSPITAL_COMMUNITY)
Admission: EM | Admit: 2015-01-24 | Discharge: 2015-01-24 | Disposition: A | Discharge status: Home / Self Care | Payer: Medicare Other | Attending: Emergency Medicine | Admitting: Emergency Medicine

## 2015-01-24 ENCOUNTER — Encounter (HOSPITAL_COMMUNITY): Payer: Self-pay | Admitting: Emergency Medicine

## 2015-01-24 DIAGNOSIS — Z7982 Long term (current) use of aspirin: Secondary | ICD-10-CM | POA: Diagnosis not present

## 2015-01-24 DIAGNOSIS — R55 Syncope and collapse: Secondary | ICD-10-CM | POA: Insufficient documentation

## 2015-01-24 DIAGNOSIS — R42 Dizziness and giddiness: Secondary | ICD-10-CM | POA: Diagnosis not present

## 2015-01-24 DIAGNOSIS — I2510 Atherosclerotic heart disease of native coronary artery without angina pectoris: Secondary | ICD-10-CM | POA: Diagnosis not present

## 2015-01-24 DIAGNOSIS — R002 Palpitations: Secondary | ICD-10-CM | POA: Diagnosis not present

## 2015-01-24 DIAGNOSIS — Z9889 Other specified postprocedural states: Secondary | ICD-10-CM | POA: Diagnosis not present

## 2015-01-24 DIAGNOSIS — Z79899 Other long term (current) drug therapy: Secondary | ICD-10-CM | POA: Insufficient documentation

## 2015-01-24 DIAGNOSIS — R531 Weakness: Secondary | ICD-10-CM | POA: Diagnosis present

## 2015-01-24 DIAGNOSIS — R000 Tachycardia, unspecified: Secondary | ICD-10-CM

## 2015-01-24 LAB — COMPREHENSIVE METABOLIC PANEL
ALT: 32 U/L (ref 0–35)
AST: 29 U/L (ref 0–37)
Albumin: 4 g/dL (ref 3.5–5.2)
Alkaline Phosphatase: 60 U/L (ref 39–117)
Anion gap: 9 (ref 5–15)
BILIRUBIN TOTAL: 0.8 mg/dL (ref 0.3–1.2)
BUN: 19 mg/dL (ref 6–23)
CALCIUM: 9.4 mg/dL (ref 8.4–10.5)
CHLORIDE: 105 mmol/L (ref 96–112)
CO2: 24 mmol/L (ref 19–32)
CREATININE: 1.07 mg/dL (ref 0.50–1.10)
GFR calc Af Amer: 58 mL/min — ABNORMAL LOW (ref >90)
GFR, EST NON AFRICAN AMERICAN: 50 mL/min — AB (ref >90)
Glucose, Bld: 107 mg/dL — ABNORMAL HIGH (ref 70–99)
Potassium: 3.7 mmol/L (ref 3.5–5.1)
Sodium: 138 mmol/L (ref 135–145)
Total Protein: 6.7 g/dL (ref 6.0–8.3)

## 2015-01-24 LAB — I-STAT TROPONIN, ED
Troponin i, poc: 0 ng/mL (ref 0.00–0.08)
Troponin i, poc: 0 ng/mL (ref 0.00–0.08)

## 2015-01-24 LAB — CBC WITH DIFFERENTIAL/PLATELET
BASOS PCT: 0 % (ref 0–1)
Basophils Absolute: 0 10*3/uL (ref 0.0–0.1)
EOS ABS: 0.3 10*3/uL (ref 0.0–0.7)
Eosinophils Relative: 4 % (ref 0–5)
HCT: 43.3 % (ref 36.0–46.0)
Hemoglobin: 14.8 g/dL (ref 12.0–15.0)
Lymphocytes Relative: 51 % — ABNORMAL HIGH (ref 12–46)
Lymphs Abs: 3.9 10*3/uL (ref 0.7–4.0)
MCH: 31.1 pg (ref 26.0–34.0)
MCHC: 34.2 g/dL (ref 30.0–36.0)
MCV: 91 fL (ref 78.0–100.0)
MONO ABS: 0.6 10*3/uL (ref 0.1–1.0)
Monocytes Relative: 8 % (ref 3–12)
Neutro Abs: 2.8 10*3/uL (ref 1.7–7.7)
Neutrophils Relative %: 37 % — ABNORMAL LOW (ref 43–77)
Platelets: 231 10*3/uL (ref 150–400)
RBC: 4.76 MIL/uL (ref 3.87–5.11)
RDW: 13.8 % (ref 11.5–15.5)
WBC: 7.6 10*3/uL (ref 4.0–10.5)

## 2015-01-24 LAB — URINALYSIS, ROUTINE W REFLEX MICROSCOPIC
Bilirubin Urine: NEGATIVE
GLUCOSE, UA: NEGATIVE mg/dL
Hgb urine dipstick: NEGATIVE
KETONES UR: NEGATIVE mg/dL
Nitrite: NEGATIVE
PH: 5.5 (ref 5.0–8.0)
Protein, ur: NEGATIVE mg/dL
Specific Gravity, Urine: 1.012 (ref 1.005–1.030)
Urobilinogen, UA: 0.2 mg/dL (ref 0.0–1.0)

## 2015-01-24 LAB — URINE MICROSCOPIC-ADD ON

## 2015-01-24 NOTE — ED Provider Notes (Signed)
CSN: [id number]     Arrival date & time 01/24/15  0302 History   First MD Initiated Contact with Patient 01/24/15 [phone]     Chief Complaint  Patient presents with  . Near Syncope  . Weakness     (Consider location/radiation/quality/duration/timing/severity/associated sxs/prior Treatment) HPI   This is a 74 year old female with a past medical history of known coronary artery disease, status post angioplasty 2 with a strong family history of myocardial infarction who presents emergency Department with chief complaint of presyncope. The patient states that she awoke from sleep this morning around 4 AM. She states noticed severe pain in her occipital region, which she described as feeling like "I was hit in the back of my head." The patient got up to use the bathroom and immediately felt as if she was going to pass out. She states that the sensation continued throughout the entire time she used the restroom. She returned instead to a recliner to see if she could shake the sensation, however . Her presyncopal symptoms continued to get worse. She felt racing in her heart and had associated nausea. Patient called out to her husband to bring her here to the emergency department. She denies chest pain, shortness of breath, vomiting or diaphoresis.  Denies fevers, chills, myalgias, arthralgias. Denies DOE,  chest tightness or pressure, radiation to left arm, jaw or back. Denies dysuria, flank pain, suprapubic pain, frequency, urgency, or hematuria. Denies headaches,weakness, visual disturbances, dystaxia , disequilibrium. Denies abdominal pain, nausea, vomiting, diarrhea or constipation.   Past Medical History  Diagnosis Date  . Heart disease    Past Surgical History  Procedure Laterality Date  . Angioplasty    . Back surgery    . Neck surgery    . Abdominal hysterectomy  1983    for endometriosis with Burch   Family History  Problem Relation Age of Onset  . Uterine cancer Mother   .  Hypertension Brother   . Heart disease Brother    History  Substance Use Topics  . Smoking status: Never Smoker   . Smokeless tobacco: Not on file  . Alcohol Use: 0.0 oz/week    0 Standard drinks or equivalent per week     Comment: "glass of wine every now and then"   OB History    Gravida Para Term Preterm AB TAB SAB Ectopic Multiple Living   Review of Systems  Ten systems reviewed and are negative for acute change, except as noted in the HPI.    Allergies  Review of patient's allergies indicates no known allergies.  Home Medications   Prior to Admission medications   Medication Sig Start Date End Date Taking? Authorizing Provider  aspirin EC 81 MG tablet Take 81 mg by mouth daily.   Yes Historical Provider, MD  ezetimibe-simvastatin (VYTORIN) 10-40 MG per tablet Take 1 tablet by mouth daily.   Yes Historical Provider, MD  Naproxen Sodium (ALEVE PO) Take 2 tablets by mouth daily as needed (pain).   Yes Historical Provider, MD  ezetimibe-simvastatin (VYTORIN) 10-20 MG per tablet Take 1 tablet by mouth.     Historical Provider, MD   BP 125/70 mmHg  Pulse 52  Temp(Src) 98.2 F (36.8 C) (Oral)  Resp 18  Ht  (1.575 m)  Wt 145 lb (65.772 kg)  BMI 26.51 kg/m2  SpO2 96% Physical Exam  Constitutional: She is oriented to person, place, and time. She  appears well-developed and well-nourished. No distress.  HENT:  Head: Normocephalic and atraumatic.  Eyes: Conjunctivae are normal. No scleral icterus.  Neck: Normal range of motion.  Cardiovascular: Normal rate, regular rhythm and normal heart sounds.  Exam reveals no gallop and no friction rub.   No murmur heard. Pulmonary/Chest: Effort normal and breath sounds normal. No respiratory distress.  Abdominal: Soft. Bowel sounds are normal. She exhibits no distension and no mass. There is no tenderness. There is no guarding.  Neurological: She is alert and oriented to person, place, and time.  Skin: Skin is  warm and dry. She is not diaphoretic.  Nursing note and vitals reviewed.   ED Course  Procedures (including critical care time) Labs Review Labs Reviewed  CBC WITH DIFFERENTIAL/PLATELET - Abnormal; Notable for the following:    Neutrophils Relative % 37 (*)    Lymphocytes Relative 51 (*)    All other components within normal limits  COMPREHENSIVE METABOLIC PANEL - Abnormal; Notable for the following:    Glucose, Bld 107 (*)    GFR calc non Af Amer 50 (*)    GFR calc Af Amer 58 (*)    All other components within normal limits  URINALYSIS, ROUTINE W REFLEX MICROSCOPIC    Imaging Review No results found.   EKG Interpretation   Date/Time:  Friday January 24 2015 03:08:37 EST Ventricular Rate:  82 PR Interval:  140 QRS Duration: 70 QT Interval:  374 QTC Calculation: 436 R Axis:   10 Text Interpretation:  Normal sinus rhythm Nonspecific ST and T wave  abnormality Abnormal ECG baseline artifact Confirmed by OTTER  MD, OLGA  ([id number]) on 01/24/2015 5:27:38 AM      MDM   Final diagnoses:  Lightheadedness  Racing heart beat    8:41 AM Patient with out continuation of her symptoms throughout her ED visit. She has an initial troponin which is negative, no leukocytosis. Slight elevation in her glucose is likely secondary to acute phase reaction due to stress. Her urine appears uninfected. I spoken with her cardiologist, Dr. Donnie Aho. We will repeat 1 troponin. If that is negative, she will follow-up at his office today to pick up a Holter monitor. I've explained the patient's lab results to her and discussed the plan of care. She is in agreement.   Second troponin negative.will discharge as planned   Arthor Captain, PA-C 01/25/15 2046  Arthor Captain, PA-C 01/25/15 2054  Juliet Rude. Rubin Payor, MD 01/28/15 [phone]

## 2015-01-24 NOTE — ED Notes (Signed)
Pt. reports near syncopal episode this morning with generalized weakness , neck pain and palpitations , denies chest pain / no SOB , mild nausea.

## 2015-01-24 NOTE — Discharge Instructions (Signed)
Go immediately to  Dr. York Spaniel office to pick up your Holter monitor.  Holter Monitoring A Holter monitor is a small device with electrodes (small sticky patches) that attach to your chest. It records the electrical activity of your heart and is worn continuously for 24-48 hours.  A HOLTER MONITOR IS USED TO  Detect heart problems such as:  Heart arrhythmia. Is an abnormal or irregular heartbeat. With some heart arrhythmias, you may not feel or know that you have an irregular heart rhythm.  Palpitations, such as feeling your heart racing or fluttering. It is possible to have heart palpitations and not have a heart arrhythmia.  A heart rhythm that is too slow or too fast.  If you have problems fainting, near fainting or feeling light-headed, a Holter monitor may be worn to see if your heart is the cause. HOLTER MONITOR PREPARATION   Electrodes will be attached to the skin on your chest.  If you have hair on your chest, small areas may have to be shaved. This is done to help the patches stick better and make the recording more accurate.  The electrodes are attached by wires to the Holter monitor. The Holter monitor clips to your clothing. You will wear the monitor at all times, even while exercising and sleeping. HOME CARE INSTRUCTIONS   Wear your monitor at all times.  The wires and the monitor must stay dry. Do not get the monitor wet.  Do not bathe, swim or use a hot tub with it on.  You may do a "sponge" bath while you have the monitor on.  Keep your skin clean, do not put body lotion or moisturizer on your chest.  It's possible that your skin under the electrodes could become irritated. To keep this from happening, you may put the electrodes in slightly different places on your chest.  Your caregiver will also ask you to keep a diary of your activities, such as walking or doing chores. Be sure to note what you are doing if you experience heart symptoms such as palpitations.  This will help your caregiver determine what might be contributing to your symptoms. The information stored in your monitor will be reviewed by your caregiver alongside your diary entries.  Make sure the monitor is safely clipped to your clothing or in a location close to your body that your caregiver recommends.  The monitor and electrodes are removed when the test is over. Return the monitor as directed.  Be sure to follow up with your caregiver and discuss your Holter monitor results. SEEK IMMEDIATE MEDICAL CARE IF:  You faint or feel lightheaded.  You have trouble breathing.  You get pain in your chest, upper arm or jaw.  You feel sick to your stomach and your skin is pale, cool, or damp.  You think something is wrong with the way your heart is beating. MAKE SURE YOU:   Understand these instructions.  Will watch your condition.  Will get help right away if you are not doing well or get worse. Document Released: 08/20/2004 Document Revised: 02/14/2012 Document Reviewed: 01/02/2009 [facility] Patient Information 2015 [place], [place]. This information is not intended to replace advice given to you by your health care provider. Make sure you discuss any questions you have with your health care provider.  Near-Syncope Near-syncope (commonly known as near fainting) is sudden weakness, dizziness, or feeling like you might pass out. During an episode of near-syncope, you may also develop pale skin, have tunnel vision, or  feel sick to your stomach (nauseous). Near-syncope may occur when getting up after sitting or while standing for a long time. It is caused by a sudden decrease in blood flow to the brain. This decrease can result from various causes or triggers, most of which are not serious. However, because near-syncope can sometimes be a sign of something serious, a medical evaluation is required. The specific cause is often not determined. HOME CARE INSTRUCTIONS  Monitor your condition  for any changes. The following actions may help to alleviate any discomfort you are experiencing:  Have someone stay with you until you feel stable.  Lie down right away and prop your feet up if you start feeling like you might faint. Breathe deeply and steadily. Wait until all the symptoms have passed. Most of these episodes last only a few minutes. You may feel tired for several hours.   Drink enough fluids to keep your urine clear or pale yellow.   If you are taking blood pressure or heart medicine, get up slowly when seated or lying down. Take several minutes to sit and then stand. This can reduce dizziness.  Follow up with your health care provider as directed. SEEK IMMEDIATE MEDICAL CARE IF:   You have a severe headache.   You have unusual pain in the chest, abdomen, or back.   You are bleeding from the mouth or rectum, or you have black or tarry stool.   You have an irregular or very fast heartbeat.   You have repeated fainting or have seizure-like jerking during an episode.   You faint when sitting or lying down.   You have confusion.   You have difficulty walking.   You have severe weakness.   You have vision problems.  MAKE SURE YOU:   Understand these instructions.  Will watch your condition.  Will get help right away if you are not doing well or get worse. Document Released: 11/22/2005 Document Revised: 11/27/2013 Document Reviewed: 04/27/2013 [facility] Patient Information 2015 [place], [place]. This information is not intended to replace advice given to you by your health care provider. Make sure you discuss any questions you have with your health care provider.

## 2015-05-28 DIAGNOSIS — M4120 Other idiopathic scoliosis, site unspecified: Secondary | ICD-10-CM | POA: Insufficient documentation

## 2015-05-28 DIAGNOSIS — M545 Low back pain, unspecified: Secondary | ICD-10-CM | POA: Insufficient documentation

## 2015-09-03 DIAGNOSIS — Z0000 Encounter for general adult medical examination without abnormal findings: Secondary | ICD-10-CM | POA: Insufficient documentation

## 2015-09-09 DIAGNOSIS — N3281 Overactive bladder: Secondary | ICD-10-CM | POA: Insufficient documentation

## 2015-11-27 ENCOUNTER — Other Ambulatory Visit: Payer: Self-pay

## 2015-11-27 DIAGNOSIS — Z1231 Encounter for screening mammogram for malignant neoplasm of breast: Secondary | ICD-10-CM

## 2016-01-05 ENCOUNTER — Ambulatory Visit
Admission: RE | Admit: 2016-01-05 | Discharge: 2016-01-05 | Disposition: A | Discharge status: Home / Self Care | Payer: Medicare Other | Source: Ambulatory Visit

## 2016-01-05 DIAGNOSIS — Z1231 Encounter for screening mammogram for malignant neoplasm of breast: Secondary | ICD-10-CM

## 2016-01-20 ENCOUNTER — Encounter: Payer: Self-pay | Admitting: Gynecology

## 2016-01-20 ENCOUNTER — Ambulatory Visit (INDEPENDENT_AMBULATORY_CARE_PROVIDER_SITE_OTHER): Payer: Medicare Other | Admitting: Gynecology

## 2016-01-20 VITALS — BP 120/76 | Ht 61.0 in | Wt 147.0 lb

## 2016-01-20 DIAGNOSIS — Z01419 Encounter for gynecological examination (general) (routine) without abnormal findings: Secondary | ICD-10-CM | POA: Diagnosis not present

## 2016-01-20 DIAGNOSIS — N3941 Urge incontinence: Secondary | ICD-10-CM | POA: Diagnosis not present

## 2016-01-20 DIAGNOSIS — N952 Postmenopausal atrophic vaginitis: Secondary | ICD-10-CM | POA: Diagnosis not present

## 2016-01-20 MED ORDER — MIRABEGRON ER 25 MG PO TB24: 25.0000 mg | ORAL_TABLET | Freq: Every day | ORAL | Status: DC

## 2016-01-20 NOTE — Patient Instructions (Signed)
Follow up for bone density as scheduled.  Try the Myrbetriq and let me know if you have any issues with this. Monitor blood pressure after starting this.  You may obtain a copy of any labs that were done today by logging onto MyChart as outlined in the instructions provided with your AVS (after visit summary). The office will not call with normal lab results but certainly if there are any significant abnormalities then we will contact you.   Health Maintenance Adopting a healthy lifestyle and getting preventive care can go a long way to promote health and wellness. Talk with your health care provider about what schedule of regular examinations is right for you. This is a good chance for you to check in with your provider about disease prevention and staying healthy. In between checkups, there are plenty of things you can do on your own. Experts have done a lot of research about which lifestyle changes and preventive measures are most likely to keep you healthy. Ask your health care provider for more information. WEIGHT AND DIET  Eat a healthy diet  Be sure to include plenty of vegetables, fruits, low-fat dairy products, and lean protein.  Do not eat a lot of foods high in solid fats, added sugars, or salt.  Get regular exercise. This is one of the most important things you can do for your health.  Most adults should exercise for at least 150 minutes each week. The exercise should increase your heart rate and make you sweat (moderate-intensity exercise).  Most adults should also do strengthening exercises at least twice a week. This is in addition to the moderate-intensity exercise.  Maintain a healthy weight  Body mass index (BMI) is a measurement that can be used to identify possible weight problems. It estimates body fat based on height and weight. Your health care provider can help determine your BMI and help you achieve or maintain a healthy weight.  For females 71 years of age and  older:   A BMI below 18.5 is considered underweight.  A BMI of 18.5 to 24.9 is normal.  A BMI of 25 to 29.9 is considered overweight.  A BMI of 30 and above is considered obese.  Watch levels of cholesterol and blood lipids  You should start having your blood tested for lipids and cholesterol at 75 years of age, then have this test every 5 years.  You may need to have your cholesterol levels checked more often if:  Your lipid or cholesterol levels are high.  You are older than 75 years of age.  You are at high risk for heart disease.  CANCER SCREENING   Lung Cancer  Lung cancer screening is recommended for adults 42-22 years old who are at high risk for lung cancer because of a history of smoking.  A yearly low-dose CT scan of the lungs is recommended for people who:  Currently smoke.  Have quit within the past 15 years.  Have at least a 30-pack-year history of smoking. A pack year is smoking an average of one pack of cigarettes a day for 1 year.  Yearly screening should continue until it has been 15 years since you quit.  Yearly screening should stop if you develop a health problem that would prevent you from having lung cancer treatment.  Breast Cancer  Practice breast self-awareness. This means understanding how your breasts normally appear and feel.  It also means doing regular breast self-exams. Let your health care provider know about any  changes, no matter how small.  If you are in your 20s or 30s, you should have a clinical breast exam (CBE) by a health care provider every 1-3 years as part of a regular health exam.  If you are 52 or older, have a CBE every year. Also consider having a breast X-ray (mammogram) every year.  If you have a family history of breast cancer, talk to your health care provider about genetic screening.  If you are at high risk for breast cancer, talk to your health care provider about having an MRI and a mammogram every  year.  Breast cancer gene (BRCA) assessment is recommended for women who have family members with BRCA-related cancers. BRCA-related cancers include:  Breast.  Ovarian.  Tubal.  Peritoneal cancers.  Results of the assessment will determine the need for genetic counseling and BRCA1 and BRCA2 testing. Cervical Cancer Routine pelvic examinations to screen for cervical cancer are no longer recommended for nonpregnant women who are considered low risk for cancer of the pelvic organs (ovaries, uterus, and vagina) and who do not have symptoms. A pelvic examination may be necessary if you have symptoms including those associated with pelvic infections. Ask your health care provider if a screening pelvic exam is right for you.   The Pap test is the screening test for cervical cancer for women who are considered at risk.  If you had a hysterectomy for a problem that was not cancer or a condition that could lead to cancer, then you no longer need Pap tests.  If you are older than 65 years, and you have had normal Pap tests for the past 10 years, you no longer need to have Pap tests.  If you have had past treatment for cervical cancer or a condition that could lead to cancer, you need Pap tests and screening for cancer for at least 20 years after your treatment.  If you no longer get a Pap test, assess your risk factors if they change (such as having a new sexual partner). This can affect whether you should start being screened again.  Some women have medical problems that increase their chance of getting cervical cancer. If this is the case for you, your health care provider may recommend more frequent screening and Pap tests.  The human papillomavirus (HPV) test is another test that may be used for cervical cancer screening. The HPV test looks for the virus that can cause cell changes in the cervix. The cells collected during the Pap test can be tested for HPV.  The HPV test can be used to screen  women 7 years of age and older. Getting tested for HPV can extend the interval between normal Pap tests from three to five years.  An HPV test also should be used to screen women of any age who have unclear Pap test results.  After 75 years of age, women should have HPV testing as often as Pap tests.  Colorectal Cancer  This type of cancer can be detected and often prevented.  Routine colorectal cancer screening usually begins at 75 years of age and continues through 75 years of age.  Your health care provider may recommend screening at an earlier age if you have risk factors for colon cancer.  Your health care provider may also recommend using home test kits to check for hidden blood in the stool.  A small camera at the end of a tube can be used to examine your colon directly (sigmoidoscopy or colonoscopy).  This is done to check for the earliest forms of colorectal cancer.  Routine screening usually begins at age 89.  Direct examination of the colon should be repeated every 5-10 years through 75 years of age. However, you may need to be screened more often if early forms of precancerous polyps or small growths are found. Skin Cancer  Check your skin from head to toe regularly.  Tell your health care provider about any new moles or changes in moles, especially if there is a change in a mole's shape or color.  Also tell your health care provider if you have a mole that is larger than the size of a pencil eraser.  Always use sunscreen. Apply sunscreen liberally and repeatedly throughout the day.  Protect yourself by wearing long sleeves, pants, a wide-brimmed hat, and sunglasses whenever you are outside. HEART DISEASE, DIABETES, AND HIGH BLOOD PRESSURE   Have your blood pressure checked at least every 1-2 years. High blood pressure causes heart disease and increases the risk of stroke.  If you are between 19 years and 33 years old, ask your health care provider if you should take  aspirin to prevent strokes.  Have regular diabetes screenings. This involves taking a blood sample to check your fasting blood sugar level.  If you are at a normal weight and have a low risk for diabetes, have this test once every three years after 75 years of age.  If you are overweight and have a high risk for diabetes, consider being tested at a younger age or more often. PREVENTING INFECTION  Hepatitis B  If you have a higher risk for hepatitis B, you should be screened for this virus. You are considered at high risk for hepatitis B if:  You were born in a country where hepatitis B is common. Ask your health care provider which countries are considered high risk.  Your parents were born in a high-risk country, and you have not been immunized against hepatitis B (hepatitis B vaccine).  You have HIV or AIDS.  You use needles to inject street drugs.  You live with someone who has hepatitis B.  You have had sex with someone who has hepatitis B.  You get hemodialysis treatment.  You take certain medicines for conditions, including cancer, organ transplantation, and autoimmune conditions. Hepatitis C  Blood testing is recommended for:  Everyone born from [birth year] through 1965.  Anyone with known risk factors for hepatitis C. Sexually transmitted infections (STIs)  You should be screened for sexually transmitted infections (STIs) including gonorrhea and chlamydia if:  You are sexually active and are younger than 75 years of age.  You are older than 75 years of age and your health care provider tells you that you are at risk for this type of infection.  Your sexual activity has changed since you were last screened and you are at an increased risk for chlamydia or gonorrhea. Ask your health care provider if you are at risk.  If you do not have HIV, but are at risk, it may be recommended that you take a prescription medicine daily to prevent HIV infection. This is called  pre-exposure prophylaxis (PrEP). You are considered at risk if:  You are sexually active and do not regularly use condoms or know the HIV status of your partner(s).  You take drugs by injection.  You are sexually active with a partner who has HIV. Talk with your health care provider about whether you are at high risk of  being infected with HIV. If you choose to begin PrEP, you should first be tested for HIV. You should then be tested every 3 months for as long as you are taking PrEP.  PREGNANCY   If you are premenopausal and you may become pregnant, ask your health care provider about preconception counseling.  If you may become pregnant, take 400 to 800 micrograms (mcg) of folic acid every day.  If you want to prevent pregnancy, talk to your health care provider about birth control (contraception). OSTEOPOROSIS AND MENOPAUSE   Osteoporosis is a disease in which the bones lose minerals and strength with aging. This can result in serious bone fractures. Your risk for osteoporosis can be identified using a bone density scan.  If you are 7 years of age or older, or if you are at risk for osteoporosis and fractures, ask your health care provider if you should be screened.  Ask your health care provider whether you should take a calcium or vitamin D supplement to lower your risk for osteoporosis.  Menopause may have certain physical symptoms and risks.  Hormone replacement therapy may reduce some of these symptoms and risks. Talk to your health care provider about whether hormone replacement therapy is right for you.  HOME CARE INSTRUCTIONS   Schedule regular health, dental, and eye exams.  Stay current with your immunizations.   Do not use any tobacco products including cigarettes, chewing tobacco, or electronic cigarettes.  If you are pregnant, do not drink alcohol.  If you are breastfeeding, limit how much and how often you drink alcohol.  Limit alcohol intake to no more than 1  drink per day for nonpregnant women. One drink equals 12 ounces of beer, 5 ounces of wine, or 1 ounces of hard liquor.  Do not use street drugs.  Do not share needles.  Ask your health care provider for help if you need support or information about quitting drugs.  Tell your health care provider if you often feel depressed.  Tell your health care provider if you have ever been abused or do not feel safe at home. Document Released: 06/07/2011 Document Revised: 04/08/2014 Document Reviewed: 10/24/2013 [facility] The Patient Information 2015 [place], [place]. This information is not intended to replace advice given to you by your health care provider. Make sure you discuss any questions you have with your health care provider.

## 2016-01-20 NOTE — Progress Notes (Signed)
Erica Frank [birth year] [id number]        75 y.o.  G1P1  for breast and pelvic exam. Several issues noted below  Past medical history,surgical history, problem list, medications, allergies, family history and social history were all reviewed and documented as reviewed in the EPIC chart.  ROS:  Performed with pertinent positives and negatives included in the history, assessment and plan.   Additional significant findings :  none   Exam: Kennon Portela assistant Filed Vitals:   01/20/16 1451  BP: 120/76  Height: 5\' 1"  (1.549 m)  Weight: 147 lb (66.679 kg)   General appearance:  Normal affect, orientation and appearance. Skin: Grossly normal HEENT: Without gross lesions.  No cervical or supraclavicular adenopathy. Thyroid normal.  Lungs:  Clear without wheezing, rales or rhonchi Cardiac: RR, without RMG Abdominal:  Soft, nontender, without masses, guarding, rebound, organomegaly or hernia Breasts:  Examined lying and sitting without masses, retractions, discharge or axillary adenopathy. Pelvic:  Ext/BUS/vagina with atrophic changes  Adnexa without masses or tenderness    Anus and perineum normal   Rectovaginal normal sphincter tone without palpated masses or tenderness.    Assessment/Plan:  75 y.o. G1P1 female for breast and pelvic exam.   1. Postmenopausal/atrophic genital changes. Status post TAH Burch procedure for endometriosis and urinary incontinence. Without significant hot flushes, night sweats, vaginal dryness. Report any issues. 2. Detrusor instability. Never started on the Oxytrol patch that we discussed last year. She brought an advertisement for Myrbetriq and wants to try this.  I reviewed the side effect profile with the medication. 25 mg tab 1 daily #30 with 12 refills. Need to check her blood pressure after starting encouraged and stressed.  She will call me if she has any issues with this.  Check urinalysis today. 3. Mammography 12/2015.  Continue with annual mammography  when due. SBE monthly reviewed. 4. DEXA 2006. Never followed up last year is recommended. Patient agrees to schedule this year and order placed. 5. Pap smear 2009. No Pap smear done today. No history of significant abnormal Pap smears. We both agreed to stop screening per current screening guidelines based on age and hysterectomy history. 6. Colonoscopy 5 years ago. Reported repeat interval 10 years. 7. Health maintenance. No routine blood work done as patient reports this done at her primary physician's office. Follow up in one year, sooner if any issues with her Myrbetriq.   Dara Lords MD, 3:29 PM 01/20/2016

## 2016-01-21 LAB — URINALYSIS W MICROSCOPIC + REFLEX CULTURE
Bacteria, UA: NONE SEEN [HPF]
Bilirubin Urine: NEGATIVE
CASTS: NONE SEEN [LPF]
CRYSTALS: NONE SEEN [HPF]
Glucose, UA: NEGATIVE
Hgb urine dipstick: NEGATIVE
Ketones, ur: NEGATIVE
Leukocytes, UA: NEGATIVE
NITRITE: NEGATIVE
Protein, ur: NEGATIVE
SPECIFIC GRAVITY, URINE: 1.026 (ref 1.001–1.035)
Yeast: NONE SEEN [HPF]
pH: 5.5 (ref 5.0–8.0)

## 2016-01-22 LAB — URINE CULTURE

## 2016-02-12 ENCOUNTER — Ambulatory Visit (INDEPENDENT_AMBULATORY_CARE_PROVIDER_SITE_OTHER): Payer: Medicare Other

## 2016-02-12 ENCOUNTER — Other Ambulatory Visit: Payer: Self-pay | Admitting: Gynecology

## 2016-02-12 DIAGNOSIS — Z780 Asymptomatic menopausal state: Secondary | ICD-10-CM

## 2016-02-12 DIAGNOSIS — Z01419 Encounter for gynecological examination (general) (routine) without abnormal findings: Secondary | ICD-10-CM

## 2016-03-01 DIAGNOSIS — I7090 Unspecified atherosclerosis: Secondary | ICD-10-CM | POA: Diagnosis not present

## 2016-03-01 DIAGNOSIS — E784 Other hyperlipidemia: Secondary | ICD-10-CM | POA: Diagnosis not present

## 2016-03-01 DIAGNOSIS — I2510 Atherosclerotic heart disease of native coronary artery without angina pectoris: Secondary | ICD-10-CM | POA: Diagnosis not present

## 2016-05-20 DIAGNOSIS — R030 Elevated blood-pressure reading, without diagnosis of hypertension: Secondary | ICD-10-CM | POA: Diagnosis not present

## 2016-05-20 DIAGNOSIS — Z6827 Body mass index (BMI) 27.0-27.9, adult: Secondary | ICD-10-CM | POA: Diagnosis not present

## 2016-05-20 DIAGNOSIS — M4120 Other idiopathic scoliosis, site unspecified: Secondary | ICD-10-CM | POA: Diagnosis not present

## 2016-06-23 DIAGNOSIS — H5203 Hypermetropia, bilateral: Secondary | ICD-10-CM | POA: Diagnosis not present

## 2016-08-06 ENCOUNTER — Emergency Department (HOSPITAL_COMMUNITY)
Admission: EM | Admit: 2016-08-06 | Discharge: 2016-08-07 | Disposition: A | Discharge status: Home / Self Care | Payer: Medicare Other | Attending: Emergency Medicine | Admitting: Emergency Medicine

## 2016-08-06 ENCOUNTER — Encounter: Payer: Self-pay | Admitting: Internal Medicine

## 2016-08-06 ENCOUNTER — Encounter (HOSPITAL_COMMUNITY): Payer: Self-pay | Admitting: Emergency Medicine

## 2016-08-06 ENCOUNTER — Emergency Department (HOSPITAL_COMMUNITY): Payer: Medicare Other

## 2016-08-06 ENCOUNTER — Telehealth: Payer: Self-pay | Admitting: Internal Medicine

## 2016-08-06 DIAGNOSIS — I2510 Atherosclerotic heart disease of native coronary artery without angina pectoris: Secondary | ICD-10-CM | POA: Insufficient documentation

## 2016-08-06 DIAGNOSIS — R002 Palpitations: Secondary | ICD-10-CM

## 2016-08-06 DIAGNOSIS — Z7982 Long term (current) use of aspirin: Secondary | ICD-10-CM | POA: Insufficient documentation

## 2016-08-06 DIAGNOSIS — Z79899 Other long term (current) drug therapy: Secondary | ICD-10-CM | POA: Insufficient documentation

## 2016-08-06 DIAGNOSIS — R0789 Other chest pain: Secondary | ICD-10-CM | POA: Diagnosis not present

## 2016-08-06 HISTORY — DX: Atherosclerotic heart disease of native coronary artery without angina pectoris: I25.10

## 2016-08-06 LAB — CBC
HCT: 44.4 % (ref 36.0–46.0)
HEMOGLOBIN: 14.7 g/dL (ref 12.0–15.0)
MCH: 30.9 pg (ref 26.0–34.0)
MCHC: 33.1 g/dL (ref 30.0–36.0)
MCV: 93.3 fL (ref 78.0–100.0)
Platelets: 281 10*3/uL (ref 150–400)
RBC: 4.76 MIL/uL (ref 3.87–5.11)
RDW: 13.5 % (ref 11.5–15.5)
WBC: 8.9 10*3/uL (ref 4.0–10.5)

## 2016-08-06 LAB — BASIC METABOLIC PANEL
ANION GAP: 6 (ref 5–15)
BUN: 27 mg/dL — ABNORMAL HIGH (ref 6–20)
CALCIUM: 9.1 mg/dL (ref 8.9–10.3)
CO2: 23 mmol/L (ref 22–32)
Chloride: 108 mmol/L (ref 101–111)
Creatinine, Ser: 0.88 mg/dL (ref 0.44–1.00)
GLUCOSE: 152 mg/dL — AB (ref 65–99)
Potassium: 3.4 mmol/L — ABNORMAL LOW (ref 3.5–5.1)
Sodium: 137 mmol/L (ref 135–145)

## 2016-08-06 LAB — I-STAT TROPONIN, ED: TROPONIN I, POC: 0 ng/mL (ref 0.00–0.08)

## 2016-08-06 NOTE — ED Provider Notes (Signed)
[facility]-EMERGENCY DEPT Provider Note   CSN: [id number] Arrival date & time: 08/06/16  2237  By signing my name below, I, Suzan Slick. Elon Spanner, attest that this documentation has been prepared under the direction and in the presence of Geoffery Lyons, MD.  Electronically Signed: Suzan Slick. Elon Spanner, ED Scribe. 08/07/16. 12:20 AM.    History   Chief Complaint Chief Complaint  Patient presents with  . Chest Pain  . Palpitations   The history is provided by the patient. No language interpreter was used.    HPI Comments: Erica Frank is a 75 y.o. female with a PMHx of CAD who presents to the Emergency Department complaining of intermittent, unchanged chest pain x 2-3 months; most recent episode this evening. Pain is described as pressure and "discomfort". She also reports intermittent rapid heart beat, fatigue, and sharp-like back pain. No aggravating or alleviating factors reported. No recent fever, chills, shortness of breath, or diaphoresis. PSHx includes angioplasty x 2 several years ago.  PCP: Hoyle Sauer, MD    Past Medical History:  Diagnosis Date  . Arthritis   . Coronary artery disease   . Heart disease     There are no active problems to display for this patient.   Past Surgical History:  Procedure Laterality Date  . ABDOMINAL HYSTERECTOMY  1983   for endometriosis with Burch  . ANGIOPLASTY    . BACK SURGERY    . NECK SURGERY      OB History    Gravida Para Term Preterm AB Living   1 1       1    SAB TAB Ectopic Multiple Live Births                   Home Medications    Prior to Admission medications   Medication Sig Start Date End Date Taking? Authorizing Provider  aspirin EC 81 MG tablet Take 81 mg by mouth daily.    Historical Provider, MD  BIOTIN PO Take by mouth.    Historical Provider, MD  ezetimibe-simvastatin (VYTORIN) 10-20 MG per tablet Take 1 tablet by mouth.     Historical Provider, MD  mirabegron ER (MYRBETRIQ) 25 MG TB24 tablet Take 1 tablet (25 mg  total) by mouth daily. 01/20/16   Dara Lords, MD  Naproxen Sodium (ALEVE PO) Take 2 tablets by mouth daily as needed (pain).    Historical Provider, MD    Family History Family History  Problem Relation Age of Onset  . Uterine cancer Mother   . Hypertension Brother   . Heart disease Brother     Social History Social History  Substance Use Topics  . Smoking status: Never Smoker  . Smokeless tobacco: Not on file  . Alcohol use 0.0 oz/week     Comment: "glass of wine every now and then"     Allergies   Review of patient's allergies indicates no known allergies.   Review of Systems Review of Systems  Constitutional: Positive for fatigue. Negative for chills.  Respiratory: Negative for shortness of breath.   Cardiovascular: Positive for chest pain and palpitations.  Gastrointestinal: Negative for nausea and vomiting.  Musculoskeletal: Positive for back pain.  All other systems reviewed and are negative.    Physical Exam Updated Vital Signs BP (!) 151/117   Pulse 108   Temp 97.9 F (36.6 C) (Oral)   Resp 16   SpO2 96%   Physical Exam  Constitutional: She is oriented to person, place, and time. She  appears well-developed and well-nourished. No distress.  HENT:  Head: Normocephalic and atraumatic.  Eyes: EOM are normal.  Neck: Normal range of motion.  Cardiovascular: Normal rate, regular rhythm and normal heart sounds.   Pulmonary/Chest: Effort normal and breath sounds normal.  Abdominal: Soft. She exhibits no distension. There is no tenderness.  Musculoskeletal: Normal range of motion.  Neurological: She is alert and oriented to person, place, and time.  Skin: Skin is warm and dry.  Psychiatric: She has a normal mood and affect. Judgment normal.  Nursing note and vitals reviewed.    ED Treatments / Results   DIAGNOSTIC STUDIES: Oxygen Saturation is 96% on RA, adequate by my interpretation.    COORDINATION OF CARE: 12:19 AM- Will order blood work,  CXR, and EKG. Discussed treatment plan with pt at bedside and pt agreed to plan.     Labs (all labs ordered are listed, but only abnormal results are displayed) Labs Reviewed  BASIC METABOLIC PANEL - Abnormal; Notable for the following:       Result Value   Potassium 3.4 (*)    Glucose, Bld 152 (*)    BUN 27 (*)    All other components within normal limits  CBC  I-STAT TROPOININ, ED    EKG  EKG Interpretation  Date/Time:  Friday August 06 2016 22:40:00 EDT Ventricular Rate:  108 PR Interval:    QRS Duration: 100 QT Interval:  350 QTC Calculation: 469 R Axis:   -7 Text Interpretation:  Accelerated Junctional rhythm Nonspecific ST abnormality Abnormal ECG Confirmed by Takiya Belmares  MD, Randall Colden ([id number]) on 08/06/2016 11:58:12 PM       Radiology Dg Chest 2 View  Result Date: 08/06/2016 CLINICAL DATA:  Acute onset of central chest discomfort and palpitations. Initial encounter. EXAM: CHEST  2 VIEW COMPARISON:  Chest radiograph performed 09/11/2011 FINDINGS: The lungs are well-aerated. Minimal bibasilar atelectasis or scarring is noted. There is no evidence of pleural effusion or pneumothorax. The heart is normal in size; the mediastinal contour is within normal limits. No acute osseous abnormalities are seen. IMPRESSION: Minimal bibasilar atelectasis or scarring noted. Lungs otherwise clear. Electronically Signed   By: Roanna Raider M.D.   On: 08/06/2016 23:18    Procedures Procedures (including critical care time)  Medications Ordered in ED Medications - No data to display   Initial Impression / Assessment and Plan / ED Course  I have reviewed the triage vital signs and the nursing notes.  Pertinent labs & imaging results that were available during my care of the patient were reviewed by me and considered in my medical decision making (see chart for details).  Clinical Course    Patient is a 75 year old female with history of coronary artery disease. She reports an angioplasty  in 1989 and a repeat in 1990. Since that time she's been doing well. She presents today with complaints of palpitations. She states that she developed a rapid heartbeat along with pressure in her chest. This lasted for several minutes, then subsided. Her physical examination is unremarkable, EKG is unchanged, and troponin 2 is negative. She has experienced no further symptoms while in the emergency department. I've discussed the disposition with her and we have decided to allow her to go home. She will touch base with her cardiologist once the weekend is over to discuss a follow-up appointment. She understands to return if her symptoms worsen or change.  Final Clinical Impressions(s) / ED Diagnoses   Final diagnoses:  None    New  Prescriptions New Prescriptions   No medications on file   I personally performed the services described in this documentation, which was scribed in my presence. The recorded information has been reviewed and is accurate.        Geoffery Lyonsouglas Kline Bulthuis, MD 08/07/16 [phone]

## 2016-08-06 NOTE — Telephone Encounter (Signed)
Patient called our after hours line regarding complaints of chest pain and a feeling that her heart was racing.  She reported that she has a history of heart disease with two prior angioplasties.  Informed patient that her chest pain cannot be evaluated over the phone and she will need to go to her local emergency department to be evaluated.  Patient expressed that she understand and her husband will drive her to the emergency department to be evaluated.

## 2016-08-06 NOTE — ED Triage Notes (Signed)
Pt. reports central chest " discomfort" with palpitations onset this evening , denies SOB , no emesis nor diaphoresis , her cardiologist is Dr. Donnie Aho , history of CAD/angioplasty .

## 2016-08-07 LAB — I-STAT TROPONIN, ED: TROPONIN I, POC: 0 ng/mL (ref 0.00–0.08)

## 2016-08-07 NOTE — ED Notes (Signed)
Patient left at this time with all belongings, refused wheelchair. 

## 2016-08-07 NOTE — Discharge Instructions (Signed)
Call your cardiologist Tuesday morning to arrange a follow-up appointment, and return to the ER if your symptoms worsen or change in the meantime.

## 2016-08-31 DIAGNOSIS — R002 Palpitations: Secondary | ICD-10-CM | POA: Diagnosis not present

## 2016-08-31 DIAGNOSIS — E784 Other hyperlipidemia: Secondary | ICD-10-CM | POA: Diagnosis not present

## 2016-08-31 DIAGNOSIS — I2510 Atherosclerotic heart disease of native coronary artery without angina pectoris: Secondary | ICD-10-CM | POA: Diagnosis not present

## 2016-08-31 DIAGNOSIS — I7090 Unspecified atherosclerosis: Secondary | ICD-10-CM | POA: Diagnosis not present

## 2016-09-06 DIAGNOSIS — M25571 Pain in right ankle and joints of right foot: Secondary | ICD-10-CM | POA: Diagnosis not present

## 2016-09-06 DIAGNOSIS — M1711 Unilateral primary osteoarthritis, right knee: Secondary | ICD-10-CM | POA: Diagnosis not present

## 2016-09-06 DIAGNOSIS — M25561 Pain in right knee: Secondary | ICD-10-CM | POA: Diagnosis not present

## 2016-09-06 DIAGNOSIS — M25552 Pain in left hip: Secondary | ICD-10-CM | POA: Diagnosis not present

## 2016-09-13 DIAGNOSIS — E784 Other hyperlipidemia: Secondary | ICD-10-CM | POA: Diagnosis not present

## 2016-09-13 DIAGNOSIS — R8299 Other abnormal findings in urine: Secondary | ICD-10-CM | POA: Diagnosis not present

## 2016-09-20 DIAGNOSIS — D126 Benign neoplasm of colon, unspecified: Secondary | ICD-10-CM | POA: Diagnosis not present

## 2016-09-20 DIAGNOSIS — Z0000 Encounter for general adult medical examination without abnormal findings: Secondary | ICD-10-CM | POA: Diagnosis not present

## 2016-09-20 DIAGNOSIS — G4700 Insomnia, unspecified: Secondary | ICD-10-CM | POA: Diagnosis not present

## 2016-09-20 DIAGNOSIS — E784 Other hyperlipidemia: Secondary | ICD-10-CM | POA: Diagnosis not present

## 2016-09-20 DIAGNOSIS — M5489 Other dorsalgia: Secondary | ICD-10-CM | POA: Diagnosis not present

## 2016-09-20 DIAGNOSIS — Z6827 Body mass index (BMI) 27.0-27.9, adult: Secondary | ICD-10-CM | POA: Diagnosis not present

## 2016-09-20 DIAGNOSIS — I2510 Atherosclerotic heart disease of native coronary artery without angina pectoris: Secondary | ICD-10-CM | POA: Diagnosis not present

## 2016-09-20 DIAGNOSIS — Z23 Encounter for immunization: Secondary | ICD-10-CM | POA: Diagnosis not present

## 2016-09-20 DIAGNOSIS — F39 Unspecified mood [affective] disorder: Secondary | ICD-10-CM | POA: Insufficient documentation

## 2016-09-20 DIAGNOSIS — N3281 Overactive bladder: Secondary | ICD-10-CM | POA: Diagnosis not present

## 2016-11-02 DIAGNOSIS — I10 Essential (primary) hypertension: Secondary | ICD-10-CM | POA: Diagnosis not present

## 2016-11-02 DIAGNOSIS — S60211A Contusion of right wrist, initial encounter: Secondary | ICD-10-CM | POA: Diagnosis not present

## 2016-11-02 DIAGNOSIS — Z6828 Body mass index (BMI) 28.0-28.9, adult: Secondary | ICD-10-CM | POA: Diagnosis not present

## 2016-11-02 DIAGNOSIS — M79641 Pain in right hand: Secondary | ICD-10-CM | POA: Diagnosis not present

## 2016-11-03 DIAGNOSIS — I10 Essential (primary) hypertension: Secondary | ICD-10-CM | POA: Diagnosis not present

## 2016-11-05 DIAGNOSIS — R002 Palpitations: Secondary | ICD-10-CM | POA: Diagnosis not present

## 2016-11-05 DIAGNOSIS — E784 Other hyperlipidemia: Secondary | ICD-10-CM | POA: Diagnosis not present

## 2016-11-05 DIAGNOSIS — I2510 Atherosclerotic heart disease of native coronary artery without angina pectoris: Secondary | ICD-10-CM | POA: Diagnosis not present

## 2016-11-05 DIAGNOSIS — I10 Essential (primary) hypertension: Secondary | ICD-10-CM | POA: Diagnosis not present

## 2016-11-05 DIAGNOSIS — I7090 Unspecified atherosclerosis: Secondary | ICD-10-CM | POA: Diagnosis not present

## 2016-12-09 DIAGNOSIS — K5730 Diverticulosis of large intestine without perforation or abscess without bleeding: Secondary | ICD-10-CM | POA: Diagnosis not present

## 2016-12-09 DIAGNOSIS — Z86010 Personal history of colonic polyps: Secondary | ICD-10-CM | POA: Diagnosis not present

## 2016-12-17 ENCOUNTER — Other Ambulatory Visit: Payer: Self-pay | Admitting: Internal Medicine

## 2016-12-17 DIAGNOSIS — Z1231 Encounter for screening mammogram for malignant neoplasm of breast: Secondary | ICD-10-CM

## 2016-12-20 DIAGNOSIS — E784 Other hyperlipidemia: Secondary | ICD-10-CM | POA: Diagnosis not present

## 2016-12-20 DIAGNOSIS — I2510 Atherosclerotic heart disease of native coronary artery without angina pectoris: Secondary | ICD-10-CM | POA: Diagnosis not present

## 2016-12-20 DIAGNOSIS — I7090 Unspecified atherosclerosis: Secondary | ICD-10-CM | POA: Diagnosis not present

## 2016-12-20 DIAGNOSIS — I10 Essential (primary) hypertension: Secondary | ICD-10-CM | POA: Diagnosis not present

## 2016-12-29 DIAGNOSIS — I10 Essential (primary) hypertension: Secondary | ICD-10-CM | POA: Diagnosis not present

## 2016-12-29 DIAGNOSIS — R030 Elevated blood-pressure reading, without diagnosis of hypertension: Secondary | ICD-10-CM | POA: Diagnosis not present

## 2016-12-29 DIAGNOSIS — R8299 Other abnormal findings in urine: Secondary | ICD-10-CM | POA: Diagnosis not present

## 2016-12-29 DIAGNOSIS — Z6828 Body mass index (BMI) 28.0-28.9, adult: Secondary | ICD-10-CM | POA: Diagnosis not present

## 2016-12-29 DIAGNOSIS — F39 Unspecified mood [affective] disorder: Secondary | ICD-10-CM | POA: Diagnosis not present

## 2016-12-29 DIAGNOSIS — G4700 Insomnia, unspecified: Secondary | ICD-10-CM | POA: Diagnosis not present

## 2017-01-10 ENCOUNTER — Inpatient Hospital Stay: Admission: RE | Admit: 2017-01-10 | Payer: Medicare Other | Source: Ambulatory Visit

## 2017-02-08 ENCOUNTER — Ambulatory Visit: Payer: Medicare Other

## 2017-03-01 ENCOUNTER — Ambulatory Visit
Admission: RE | Admit: 2017-03-01 | Discharge: 2017-03-01 | Disposition: A | Discharge status: Home / Self Care | Payer: Medicare Other | Source: Ambulatory Visit | Attending: Internal Medicine | Admitting: Internal Medicine

## 2017-03-01 DIAGNOSIS — Z1231 Encounter for screening mammogram for malignant neoplasm of breast: Secondary | ICD-10-CM

## 2017-03-21 DIAGNOSIS — E119 Type 2 diabetes mellitus without complications: Secondary | ICD-10-CM | POA: Diagnosis not present

## 2017-03-21 DIAGNOSIS — I2510 Atherosclerotic heart disease of native coronary artery without angina pectoris: Secondary | ICD-10-CM | POA: Diagnosis not present

## 2017-03-21 DIAGNOSIS — E668 Other obesity: Secondary | ICD-10-CM | POA: Diagnosis not present

## 2017-03-21 DIAGNOSIS — E784 Other hyperlipidemia: Secondary | ICD-10-CM | POA: Diagnosis not present

## 2017-03-31 ENCOUNTER — Other Ambulatory Visit: Payer: Self-pay | Admitting: Gynecology

## 2017-07-12 DIAGNOSIS — H5203 Hypermetropia, bilateral: Secondary | ICD-10-CM | POA: Diagnosis not present

## 2017-07-13 DIAGNOSIS — M4120 Other idiopathic scoliosis, site unspecified: Secondary | ICD-10-CM | POA: Diagnosis not present

## 2017-07-13 DIAGNOSIS — Z6827 Body mass index (BMI) 27.0-27.9, adult: Secondary | ICD-10-CM | POA: Diagnosis not present

## 2017-07-13 DIAGNOSIS — I10 Essential (primary) hypertension: Secondary | ICD-10-CM | POA: Diagnosis not present

## 2017-09-12 DIAGNOSIS — M1711 Unilateral primary osteoarthritis, right knee: Secondary | ICD-10-CM | POA: Diagnosis not present

## 2017-09-12 DIAGNOSIS — M1712 Unilateral primary osteoarthritis, left knee: Secondary | ICD-10-CM | POA: Diagnosis not present

## 2017-09-12 DIAGNOSIS — M170 Bilateral primary osteoarthritis of knee: Secondary | ICD-10-CM | POA: Diagnosis not present

## 2017-09-13 DIAGNOSIS — I10 Essential (primary) hypertension: Secondary | ICD-10-CM | POA: Diagnosis not present

## 2017-09-13 DIAGNOSIS — E668 Other obesity: Secondary | ICD-10-CM | POA: Diagnosis not present

## 2017-09-13 DIAGNOSIS — I7090 Unspecified atherosclerosis: Secondary | ICD-10-CM | POA: Diagnosis not present

## 2017-09-13 DIAGNOSIS — E7849 Other hyperlipidemia: Secondary | ICD-10-CM | POA: Diagnosis not present

## 2017-09-13 DIAGNOSIS — I2510 Atherosclerotic heart disease of native coronary artery without angina pectoris: Secondary | ICD-10-CM | POA: Diagnosis not present

## 2017-09-30 DIAGNOSIS — E7849 Other hyperlipidemia: Secondary | ICD-10-CM | POA: Diagnosis not present

## 2017-09-30 DIAGNOSIS — I10 Essential (primary) hypertension: Secondary | ICD-10-CM | POA: Diagnosis not present

## 2017-10-05 DIAGNOSIS — I2510 Atherosclerotic heart disease of native coronary artery without angina pectoris: Secondary | ICD-10-CM | POA: Diagnosis not present

## 2017-10-05 DIAGNOSIS — Z0000 Encounter for general adult medical examination without abnormal findings: Secondary | ICD-10-CM | POA: Diagnosis not present

## 2017-10-05 DIAGNOSIS — I10 Essential (primary) hypertension: Secondary | ICD-10-CM | POA: Diagnosis not present

## 2017-10-05 DIAGNOSIS — E7849 Other hyperlipidemia: Secondary | ICD-10-CM | POA: Diagnosis not present

## 2017-10-10 DIAGNOSIS — I2510 Atherosclerotic heart disease of native coronary artery without angina pectoris: Secondary | ICD-10-CM | POA: Diagnosis not present

## 2017-10-10 DIAGNOSIS — I10 Essential (primary) hypertension: Secondary | ICD-10-CM | POA: Diagnosis not present

## 2017-10-10 DIAGNOSIS — E7849 Other hyperlipidemia: Secondary | ICD-10-CM | POA: Diagnosis not present

## 2017-10-10 DIAGNOSIS — I7090 Unspecified atherosclerosis: Secondary | ICD-10-CM | POA: Diagnosis not present

## 2017-10-10 DIAGNOSIS — R079 Chest pain, unspecified: Secondary | ICD-10-CM | POA: Diagnosis not present

## 2017-10-10 DIAGNOSIS — Z1212 Encounter for screening for malignant neoplasm of rectum: Secondary | ICD-10-CM | POA: Diagnosis not present

## 2017-10-20 DIAGNOSIS — I2510 Atherosclerotic heart disease of native coronary artery without angina pectoris: Secondary | ICD-10-CM | POA: Diagnosis not present

## 2017-10-20 DIAGNOSIS — E7849 Other hyperlipidemia: Secondary | ICD-10-CM | POA: Diagnosis not present

## 2017-10-20 DIAGNOSIS — I10 Essential (primary) hypertension: Secondary | ICD-10-CM | POA: Diagnosis not present

## 2017-10-20 DIAGNOSIS — I7090 Unspecified atherosclerosis: Secondary | ICD-10-CM | POA: Diagnosis not present

## 2018-01-19 ENCOUNTER — Other Ambulatory Visit: Payer: Self-pay | Admitting: Internal Medicine

## 2018-01-19 DIAGNOSIS — Z1231 Encounter for screening mammogram for malignant neoplasm of breast: Secondary | ICD-10-CM

## 2018-02-01 DIAGNOSIS — M170 Bilateral primary osteoarthritis of knee: Secondary | ICD-10-CM | POA: Diagnosis not present

## 2018-02-01 DIAGNOSIS — M25561 Pain in right knee: Secondary | ICD-10-CM | POA: Diagnosis not present

## 2018-02-01 DIAGNOSIS — M1711 Unilateral primary osteoarthritis, right knee: Secondary | ICD-10-CM | POA: Diagnosis not present

## 2018-02-01 DIAGNOSIS — M25562 Pain in left knee: Secondary | ICD-10-CM | POA: Diagnosis not present

## 2018-02-02 ENCOUNTER — Telehealth: Payer: Self-pay | Admitting: Cardiology

## 2018-02-02 NOTE — Telephone Encounter (Signed)
Received page from patient.  She stated that her blood pressure is running in the 170s and she is feeling somewhat "jittery."  She denied any sensations of chest pain or shortness of breath.  She is taking her 10 mg of lisinopril this evening.  I advised her to call Dr. York Spaniel office in the morning if her blood pressure remains elevated to seek advice about possible up titration of lisinopril or addition of other agents.  The patient agreed to seek emergency department evaluation to experience chest pain or shortness of breath or any other concerning symptoms this evening.

## 2018-02-27 DIAGNOSIS — H2513 Age-related nuclear cataract, bilateral: Secondary | ICD-10-CM | POA: Diagnosis not present

## 2018-03-03 ENCOUNTER — Ambulatory Visit: Payer: Medicare Other

## 2018-03-14 DIAGNOSIS — I10 Essential (primary) hypertension: Secondary | ICD-10-CM | POA: Diagnosis not present

## 2018-03-14 DIAGNOSIS — I2510 Atherosclerotic heart disease of native coronary artery without angina pectoris: Secondary | ICD-10-CM | POA: Diagnosis not present

## 2018-03-14 DIAGNOSIS — I7090 Unspecified atherosclerosis: Secondary | ICD-10-CM | POA: Diagnosis not present

## 2018-03-14 DIAGNOSIS — E785 Hyperlipidemia, unspecified: Secondary | ICD-10-CM | POA: Diagnosis not present

## 2018-04-05 DIAGNOSIS — F39 Unspecified mood [affective] disorder: Secondary | ICD-10-CM | POA: Diagnosis not present

## 2018-04-05 DIAGNOSIS — I2510 Atherosclerotic heart disease of native coronary artery without angina pectoris: Secondary | ICD-10-CM | POA: Diagnosis not present

## 2018-04-05 DIAGNOSIS — E7849 Other hyperlipidemia: Secondary | ICD-10-CM | POA: Diagnosis not present

## 2018-04-05 DIAGNOSIS — I10 Essential (primary) hypertension: Secondary | ICD-10-CM | POA: Diagnosis not present

## 2018-04-06 DIAGNOSIS — H25811 Combined forms of age-related cataract, right eye: Secondary | ICD-10-CM | POA: Diagnosis not present

## 2018-04-06 DIAGNOSIS — H2511 Age-related nuclear cataract, right eye: Secondary | ICD-10-CM | POA: Diagnosis not present

## 2018-04-13 DIAGNOSIS — M25551 Pain in right hip: Secondary | ICD-10-CM | POA: Diagnosis not present

## 2018-04-20 DIAGNOSIS — H25812 Combined forms of age-related cataract, left eye: Secondary | ICD-10-CM | POA: Diagnosis not present

## 2018-04-20 DIAGNOSIS — H2512 Age-related nuclear cataract, left eye: Secondary | ICD-10-CM | POA: Diagnosis not present

## 2018-04-27 DIAGNOSIS — M4120 Other idiopathic scoliosis, site unspecified: Secondary | ICD-10-CM | POA: Diagnosis not present

## 2018-04-28 ENCOUNTER — Other Ambulatory Visit: Payer: Self-pay | Admitting: Internal Medicine

## 2018-04-28 DIAGNOSIS — R222 Localized swelling, mass and lump, trunk: Secondary | ICD-10-CM

## 2018-05-02 ENCOUNTER — Ambulatory Visit
Admission: RE | Admit: 2018-05-02 | Discharge: 2018-05-02 | Disposition: A | Discharge status: Home / Self Care | Payer: Medicare Other | Source: Ambulatory Visit | Attending: Internal Medicine | Admitting: Internal Medicine

## 2018-05-02 ENCOUNTER — Ambulatory Visit: Payer: Medicare Other

## 2018-05-02 DIAGNOSIS — R222 Localized swelling, mass and lump, trunk: Secondary | ICD-10-CM

## 2018-05-02 DIAGNOSIS — N6489 Other specified disorders of breast: Secondary | ICD-10-CM | POA: Diagnosis not present

## 2018-05-02 DIAGNOSIS — R928 Other abnormal and inconclusive findings on diagnostic imaging of breast: Secondary | ICD-10-CM | POA: Diagnosis not present

## 2018-05-05 ENCOUNTER — Ambulatory Visit: Payer: Medicare Other

## 2018-05-22 DIAGNOSIS — M5412 Radiculopathy, cervical region: Secondary | ICD-10-CM | POA: Diagnosis not present

## 2018-06-07 DIAGNOSIS — M1711 Unilateral primary osteoarthritis, right knee: Secondary | ICD-10-CM | POA: Diagnosis not present

## 2018-06-07 DIAGNOSIS — Z961 Presence of intraocular lens: Secondary | ICD-10-CM | POA: Diagnosis not present

## 2018-06-07 DIAGNOSIS — M25561 Pain in right knee: Secondary | ICD-10-CM | POA: Diagnosis not present

## 2018-09-26 ENCOUNTER — Other Ambulatory Visit: Payer: Self-pay | Admitting: Cardiology

## 2018-09-26 NOTE — Telephone Encounter (Signed)
° ° ° °  1. Which medications need to be refilled? (please list name of each medication and dose if known) lisinopril 20mg  tablet  2. Which pharmacy/location (including street and city if local pharmacy) is medication to be sent to? [facility] on [address]  3. Do they need a 30 day or 90 day supply? 90

## 2018-09-27 MED ORDER — LISINOPRIL 20 MG PO TABS: 20.0000 mg | ORAL_TABLET | Freq: Every day | ORAL | 0 refills | Status: DC

## 2018-09-27 NOTE — Addendum Note (Signed)
Addended by: Arville Care on: 09/27/2018 10:19 AM   Modules accepted: Orders

## 2018-09-27 NOTE — Telephone Encounter (Signed)
Refill on Lisinopril requested from Dr. Donnie Aho patient. Refill has been okayed by Dr. Bing Matter

## 2018-09-29 ENCOUNTER — Telehealth: Payer: Self-pay | Admitting: Cardiology

## 2018-09-29 DIAGNOSIS — Z23 Encounter for immunization: Secondary | ICD-10-CM | POA: Diagnosis not present

## 2018-09-29 MED ORDER — LISINOPRIL 10 MG PO TABS: 10.0000 mg | ORAL_TABLET | Freq: Every day | ORAL | 2 refills | Status: DC

## 2018-09-29 NOTE — Telephone Encounter (Signed)
° ° ° °  1. Which medications need to be refilled? (please list name of each medication and dose if known) Lisinopril 10mg  tabs  2. Which pharmacy/location (including street and city if local pharmacy) is medication to be sent to? [facility] on [address] [place]  3. Do they need a 30 day or 90 day supply? 90

## 2018-09-29 NOTE — Addendum Note (Signed)
Addended by: Lita Mains on: 09/29/2018 02:32 PM   Modules accepted: Orders

## 2018-09-29 NOTE — Telephone Encounter (Signed)
Confirmed with patient proper dosage of Lisinopril for 10 mg daily. Refilled this per Dr. Bing Matter.

## 2018-10-03 ENCOUNTER — Telehealth: Payer: Self-pay | Admitting: Cardiology

## 2018-10-03 NOTE — Telephone Encounter (Signed)
° ° ° °  1. Which medications need to be refilled? (please list name of each medication and dose if known) Ezetimibe/simvastatin 10-40mg   2. Which pharmacy/location (including street and city if local pharmacy) is medication to be sent to? [facility] on [address] [place]  3. Do they need a 30 day or 90 day supply? 90

## 2018-10-03 NOTE — Telephone Encounter (Signed)
Awaitng Dr. Krasowski's review. 

## 2018-10-04 MED ORDER — EZETIMIBE-SIMVASTATIN 10-40 MG PO TABS: 1.0000 | ORAL_TABLET | Freq: Every day | ORAL | 0 refills | Status: DC

## 2018-10-04 NOTE — Telephone Encounter (Signed)
Refilled Vytorin 10-40 mg daily refilled per Dr. Bing Matter.

## 2018-10-04 NOTE — Telephone Encounter (Signed)
Please fill once K reviews. 

## 2018-10-17 DIAGNOSIS — H53483 Generalized contraction of visual field, bilateral: Secondary | ICD-10-CM | POA: Diagnosis not present

## 2018-10-17 DIAGNOSIS — H02834 Dermatochalasis of left upper eyelid: Secondary | ICD-10-CM | POA: Diagnosis not present

## 2018-10-17 DIAGNOSIS — H02831 Dermatochalasis of right upper eyelid: Secondary | ICD-10-CM | POA: Diagnosis not present

## 2018-10-17 DIAGNOSIS — H02413 Mechanical ptosis of bilateral eyelids: Secondary | ICD-10-CM | POA: Diagnosis not present

## 2018-10-18 DIAGNOSIS — H53483 Generalized contraction of visual field, bilateral: Secondary | ICD-10-CM | POA: Diagnosis not present

## 2018-10-19 ENCOUNTER — Telehealth: Payer: Self-pay | Admitting: Cardiology

## 2018-10-19 NOTE — Telephone Encounter (Signed)
Patient was previously given 30 days per [facility], patient will need an appointment for further refills.

## 2018-10-19 NOTE — Telephone Encounter (Signed)
° ° °  1. Which medications need to be refilled? (please list name of each medication and dose if known) Vytorin 10mg -40mg  tablet 1QD  2. Which pharmacy/location (including street and city if local pharmacy) is medication to be sent to?[facility] [phone] on [address] [place]  3. Do they need a 30 day or 90 day supply? 90

## 2018-10-19 NOTE — Telephone Encounter (Signed)
LAM on home phone for patient to call back and schedule visit, one refill already sent in, must see dr to continue with refills.

## 2018-11-03 DIAGNOSIS — E7849 Other hyperlipidemia: Secondary | ICD-10-CM | POA: Diagnosis not present

## 2018-11-03 DIAGNOSIS — I10 Essential (primary) hypertension: Secondary | ICD-10-CM | POA: Diagnosis not present

## 2018-11-03 DIAGNOSIS — R82998 Other abnormal findings in urine: Secondary | ICD-10-CM | POA: Diagnosis not present

## 2018-11-09 DIAGNOSIS — I2510 Atherosclerotic heart disease of native coronary artery without angina pectoris: Secondary | ICD-10-CM | POA: Diagnosis not present

## 2018-11-09 DIAGNOSIS — I10 Essential (primary) hypertension: Secondary | ICD-10-CM | POA: Diagnosis not present

## 2018-11-09 DIAGNOSIS — Z0000 Encounter for general adult medical examination without abnormal findings: Secondary | ICD-10-CM | POA: Diagnosis not present

## 2018-11-09 DIAGNOSIS — E7849 Other hyperlipidemia: Secondary | ICD-10-CM | POA: Diagnosis not present

## 2018-11-17 DIAGNOSIS — D23112 Other benign neoplasm of skin of right lower eyelid, including canthus: Secondary | ICD-10-CM | POA: Diagnosis not present

## 2018-11-17 DIAGNOSIS — H02423 Myogenic ptosis of bilateral eyelids: Secondary | ICD-10-CM | POA: Diagnosis not present

## 2018-11-17 DIAGNOSIS — D485 Neoplasm of uncertain behavior of skin: Secondary | ICD-10-CM | POA: Diagnosis not present

## 2018-11-24 DIAGNOSIS — Z09 Encounter for follow-up examination after completed treatment for conditions other than malignant neoplasm: Secondary | ICD-10-CM | POA: Diagnosis not present

## 2018-12-01 ENCOUNTER — Other Ambulatory Visit: Payer: Self-pay | Admitting: Cardiology

## 2018-12-04 ENCOUNTER — Telehealth: Payer: Self-pay | Admitting: Cardiology

## 2018-12-04 MED ORDER — EZETIMIBE-SIMVASTATIN 10-40 MG PO TABS: 1.0000 | ORAL_TABLET | Freq: Every day | ORAL | 0 refills | Status: DC

## 2018-12-04 NOTE — Telephone Encounter (Signed)
Vytorin 10-40 mg tablet once daily  refilled per Dr. Bing Matter

## 2018-12-04 NOTE — Telephone Encounter (Signed)
Has been given to Dr. Bing Matter for her to review

## 2018-12-06 DIAGNOSIS — I219 Acute myocardial infarction, unspecified: Secondary | ICD-10-CM

## 2018-12-06 HISTORY — DX: Acute myocardial infarction, unspecified: I21.9

## 2018-12-20 ENCOUNTER — Ambulatory Visit (INDEPENDENT_AMBULATORY_CARE_PROVIDER_SITE_OTHER): Payer: Medicare Other | Admitting: Cardiology

## 2018-12-20 ENCOUNTER — Encounter: Payer: Self-pay | Admitting: Cardiology

## 2018-12-20 DIAGNOSIS — E785 Hyperlipidemia, unspecified: Secondary | ICD-10-CM | POA: Diagnosis not present

## 2018-12-20 DIAGNOSIS — I25709 Atherosclerosis of coronary artery bypass graft(s), unspecified, with unspecified angina pectoris: Secondary | ICD-10-CM

## 2018-12-20 DIAGNOSIS — I10 Essential (primary) hypertension: Secondary | ICD-10-CM

## 2018-12-20 DIAGNOSIS — I2510 Atherosclerotic heart disease of native coronary artery without angina pectoris: Secondary | ICD-10-CM

## 2018-12-20 DIAGNOSIS — I25110 Atherosclerotic heart disease of native coronary artery with unstable angina pectoris: Secondary | ICD-10-CM | POA: Insufficient documentation

## 2018-12-20 MED ORDER — EZETIMIBE-SIMVASTATIN 10-40 MG PO TABS: 1.0000 | ORAL_TABLET | Freq: Every day | ORAL | 1 refills | Status: DC

## 2018-12-20 MED ORDER — NITROGLYCERIN 0.4 MG SL SUBL: 0.4000 mg | SUBLINGUAL_TABLET | SUBLINGUAL | 11 refills | Status: DC | PRN

## 2018-12-20 MED ORDER — LISINOPRIL 10 MG PO TABS: 10.0000 mg | ORAL_TABLET | Freq: Every day | ORAL | 1 refills | Status: DC

## 2018-12-20 NOTE — Progress Notes (Signed)
Cardiology Office Note:    Date:  12/20/2018   ID:  Erica Frank, DOB 04-Mar-1941, MRN [id number]  PCP:  Chilton Greathouse, MD  Cardiologist:  Gypsy Balsam, MD    Referring MD: Chilton Greathouse, MD   Chief Complaint  Patient presents with  . Follow-up  Am doing well  History of Present Illness:    Erica Frank is a 78 y.o. female who is a patient of Dr. Donnie Aho she does have remote history of coronary artery disease with PCI today and stenting of LAD in 1989 and then 1990 since that time she is doing well she did have a last stress test done at the end of 2018 which was negative.  Overall she is doing well described to have some chest pain which is atypical not related to exercise she takes proton pump inhibitor which seems to be helping with the pain she did not try nitroglycerin she said when she had the pain pain is only mild and last for hours straight.  She is very active however does not exercise on a regular basis she used to swim a lot but now because of neck problems she had difficulty doing it.  She is getting ready to take a trip to [place].    Past Medical History:  Diagnosis Date  . Arthritis   . Coronary artery disease   . Heart disease     Past Surgical History:  Procedure Laterality Date  . ABDOMINAL HYSTERECTOMY  1983   for endometriosis with Burch  . ANGIOPLASTY    . BACK SURGERY    . NECK SURGERY      Current Medications: Current Meds  Medication Sig  . aspirin EC 81 MG tablet Take 81 mg by mouth daily.  Marland Kitchen BIOTIN PO Take 1 tablet by mouth daily.   Marland Kitchen ezetimibe-simvastatin (VYTORIN) 10-40 MG tablet Take 1 tablet by mouth daily.  Marland Kitchen lisinopril (PRINIVIL,ZESTRIL) 10 MG tablet Take 1 tablet (10 mg total) by mouth daily.  . Naproxen Sodium (ALEVE PO) Take 2 tablets by mouth daily as needed (pain).  . nitroGLYCERIN (NITROSTAT) 0.4 MG SL tablet Place 0.4 mg under the tongue every 5 (five) minutes as needed for chest pain.     Allergies:   Patient has no  known allergies.   Social History   Socioeconomic History  . Marital status: Married    Spouse name: Not on file  . Number of children: Not on file  . Years of education: Not on file  . Highest education level: Not on file  Occupational History  . Not on file  Social Needs  . Financial resource strain: Not on file  . Food insecurity:    Worry: Not on file    Inability: Not on file  . Transportation needs:    Medical: Not on file    Non-medical: Not on file  Tobacco Use  . Smoking status: Never Smoker  . Smokeless tobacco: Never Used  Substance and Sexual Activity  . Alcohol use: Yes    Alcohol/week: 0.0 standard drinks    Comment: "glass of wine every now and then"  . Drug use: No  . Sexual activity: Yes    Birth control/protection: Post-menopausal    Comment: 1st intercourse 4 yo-1 partners  Lifestyle  . Physical activity:    Days per week: Not on file    Minutes per session: Not on file  . Stress: Not on file  Relationships  . Social connections:  Talks on phone: Not on file    Gets together: Not on file    Attends religious service: Not on file    Active member of club or organization: Not on file    Attends meetings of clubs or organizations: Not on file    Relationship status: Not on file  Other Topics Concern  . Not on file  Social History Narrative  . Not on file     Family History: The patient's family history includes Breast cancer in her cousin; Heart disease in her brother; Hypertension in her brother; Uterine cancer in her mother. ROS:   Please see the history of present illness.    All 14 point review of systems negative except as described per history of present illness  EKGs/Labs/Other Studies Reviewed:      Recent Labs: No results found for requested labs within last 8760 hours.  Recent Lipid Panel No results found for: CHOL, TRIG, HDL, CHOLHDL, VLDL, LDLCALC, LDLDIRECT  Physical Exam:    VS:  BP 140/70   Pulse (!) 56   Ht 5'  2.5" (1.588 m)   Wt 145 lb 6.4 oz (66 kg)   SpO2 95%   BMI 26.17 kg/m     Wt Readings from Last 3 Encounters:  12/20/18 145 lb 6.4 oz (66 kg)  01/20/16 147 lb (66.7 kg)  01/24/15 145 lb (65.8 kg)     GEN:  Well nourished, well developed in no acute distress HEENT: Normal NECK: No JVD; No carotid bruits LYMPHATICS: No lymphadenopathy CARDIAC: RRR, no murmurs, no rubs, no gallops RESPIRATORY:  Clear to auscultation without rales, wheezing or rhonchi  ABDOMEN: Soft, non-tender, non-distended MUSCULOSKELETAL:  No edema; No deformity  SKIN: Warm and dry LOWER EXTREMITIES: no swelling NEUROLOGIC:  Alert and oriented x 3 PSYCHIATRIC:  Normal affect   ASSESSMENT:    1. Coronary artery disease involving coronary bypass graft of native heart with angina pectoris (HCC)   2. Dyslipidemia   3. Essential hypertension    PLAN:    In order of problems listed above:  1. Coronary artery disease stable does have some atypical chest pain offer her stress test she does not want to do it before her trip to [place] therefore she will call us after returning from a trip and then we will schedule him to have the test done. 2. Dyslipidemia will call primary care physician to get her fasting lipid profile. 3. Essential hypertension blood pressure well controlled continue present management. 4. Will do electrocardiogram I will give her a copy of her test.  See her back in 6 months sooner she have a problem   Medication Adjustments/Labs and Tests Ordered: Current medicines are reviewed at length with the patient today.  Concerns regarding medicines are outlined above.  No orders of the defined types were placed in this encounter.  Medication changes: No orders of the defined types were placed in this encounter.   Signed, Georgeanna Lea, MD, [facility] 12/20/2018 1:35 PM    [facility]

## 2018-12-20 NOTE — Patient Instructions (Signed)
Medication Instructions:  Your physician recommends that you continue on your current medications as directed. Please refer to the Current Medication list given to you today.  If you need a refill on your cardiac medications before your next appointment, please call your pharmacy.   Lab work: None.  If you have labs (blood work) drawn today and your tests are completely normal, you will receive your results only by: Marland Kitchen MyChart Message (if you have MyChart) OR . A paper copy in the mail If you have any lab test that is abnormal or we need to change your treatment, we will call you to review the results.  Testing/Procedures: None.   Follow-Up: At [facility], you and your health needs are our priority.  As part of our continuing mission to provide you with exceptional heart care, we have created designated Provider Care Teams.  These Care Teams include your primary Cardiologist (physician) and Advanced Practice Providers (APPs -  Physician Assistants and Nurse Practitioners) who all work together to provide you with the care you need, when you need it. You will need a follow up appointment in 6 months.  Please call our office 2 months in advance to schedule this appointment.  You may see W Viann Fish, MD or another member of our [facility] Provider Team in [place]: Norman Herrlich, MD . Belva Crome, MD  Any Other Special Instructions Will Be Listed Below (If Applicable).

## 2019-01-09 ENCOUNTER — Telehealth: Payer: Self-pay | Admitting: Cardiology

## 2019-01-09 NOTE — Telephone Encounter (Signed)
  Patient came from Dr Donnie Aho  and she saw Dr Bing Matter in [place].  Patient states that is too far for her to drive and her mother and brother see Dr [place] so she is requesting to switch to Dr [place].

## 2019-01-09 NOTE — Telephone Encounter (Signed)
Fine with me

## 2019-01-09 NOTE — Telephone Encounter (Signed)
OK by me  Peter [place] MD, FACC   

## 2019-01-25 ENCOUNTER — Emergency Department (HOSPITAL_COMMUNITY): Payer: Medicare Other

## 2019-01-25 ENCOUNTER — Encounter (HOSPITAL_COMMUNITY): Payer: Self-pay | Admitting: Emergency Medicine

## 2019-01-25 ENCOUNTER — Emergency Department (HOSPITAL_COMMUNITY)
Admission: EM | Admit: 2019-01-25 | Discharge: 2019-01-26 | Disposition: A | Discharge status: Home / Self Care | Payer: Medicare Other | Attending: Emergency Medicine | Admitting: Emergency Medicine

## 2019-01-25 DIAGNOSIS — Z7982 Long term (current) use of aspirin: Secondary | ICD-10-CM | POA: Insufficient documentation

## 2019-01-25 DIAGNOSIS — R002 Palpitations: Secondary | ICD-10-CM | POA: Diagnosis not present

## 2019-01-25 DIAGNOSIS — I10 Essential (primary) hypertension: Secondary | ICD-10-CM | POA: Diagnosis not present

## 2019-01-25 DIAGNOSIS — I480 Paroxysmal atrial fibrillation: Secondary | ICD-10-CM | POA: Diagnosis not present

## 2019-01-25 DIAGNOSIS — R52 Pain, unspecified: Secondary | ICD-10-CM | POA: Diagnosis not present

## 2019-01-25 DIAGNOSIS — Z79899 Other long term (current) drug therapy: Secondary | ICD-10-CM | POA: Diagnosis not present

## 2019-01-25 DIAGNOSIS — I4891 Unspecified atrial fibrillation: Secondary | ICD-10-CM | POA: Diagnosis not present

## 2019-01-25 DIAGNOSIS — R0902 Hypoxemia: Secondary | ICD-10-CM | POA: Diagnosis not present

## 2019-01-25 DIAGNOSIS — I499 Cardiac arrhythmia, unspecified: Secondary | ICD-10-CM | POA: Diagnosis not present

## 2019-01-25 HISTORY — DX: Unspecified atrial fibrillation: I48.91

## 2019-01-25 HISTORY — DX: Essential (primary) hypertension: I10

## 2019-01-25 HISTORY — DX: Pure hypercholesterolemia, unspecified: E78.00

## 2019-01-25 MED ORDER — PROPOFOL 10 MG/ML IV BOLUS
0.5000 mg/kg | Freq: Once | INTRAVENOUS | Status: AC
  Administered 2019-01-26: 29.5 mg via INTRAVENOUS
  Filled 2019-01-25: qty 20

## 2019-01-25 MED ORDER — APIXABAN 5 MG PO TABS
5.0000 mg | ORAL_TABLET | Freq: Two times a day (BID) | ORAL | Status: DC
  Administered 2019-01-26: 5 mg via ORAL
  Filled 2019-01-25: qty 1

## 2019-01-25 NOTE — ED Notes (Signed)
Patient transported to X-ray 

## 2019-01-25 NOTE — ED Notes (Signed)
BIB EMS from home. Pt reports chest palpitations and feeling of her heart racing that woke her up from sleep. HR initially 140's-180's with EMS, was given 5mg  Metoprolol. Developed aching in her jaw en route. Given 324 ASA. HR now down to 110's.

## 2019-01-25 NOTE — ED Provider Notes (Signed)
[facility] EMERGENCY DEPARTMENT Provider Note   CSN: [id number] Arrival date & time: 01/25/19  2308    History   Chief Complaint Chief Complaint  Patient presents with  . Atrial Fibrillation    HPI Erica Frank is a 78 y.o. female.     The history is provided by the patient.  Atrial Fibrillation   She has history of hypertension, hyperlipidemia, coronary artery disease and comes in because of an episode of palpitations.  She was in bed when she suddenly felt her heart racing.  When she went down steps, she noted that she was somewhat short of breath.  She denies chest pain, heaviness, tightness, pressure.  She denies nausea, vomiting, diaphoresis.  EMS arrived and noted she was in atrial fibrillation with rapid ventricular response and gave her metoprolol and heart rate has decreased.  She says that she had one other episode that she thinks was atrial fibrillation, but was not placed on any anticoagulants.    Past Medical History:  Diagnosis Date  . Arthritis   . Atrial fibrillation (HCC)   . Coronary artery disease   . Heart disease   . High cholesterol   . Hypertension     Patient Active Problem List   Diagnosis Date Noted  . Coronary artery disease involving coronary bypass graft of native heart with angina pectoris (HCC) 12/20/2018  . Dyslipidemia 12/20/2018  . Essential hypertension 12/20/2018    Past Surgical History:  Procedure Laterality Date  . ABDOMINAL HYSTERECTOMY  1983   for endometriosis with Burch  . ANGIOPLASTY    . ANGIOPLASTY    . BACK SURGERY    . CARDIAC SURGERY     Catheterization  . NECK SURGERY       OB History    Gravida  1   Para  1   Term      Preterm      AB      Living  1     SAB      TAB      Ectopic      Multiple      Live Births               Home Medications    Prior to Admission medications   Medication Sig Start Date End Date Taking? Authorizing Provider  aspirin EC 81 MG  tablet Take 81 mg by mouth daily.    [provider]  BIOTIN PO Take 1 tablet by mouth daily.     [provider]  ezetimibe-simvastatin (VYTORIN) 10-40 MG tablet Take 1 tablet by mouth daily. 12/20/18   Georgeanna Lea, MD  lisinopril (PRINIVIL,ZESTRIL) 10 MG tablet Take 1 tablet (10 mg total) by mouth daily. 12/20/18 03/20/19  Georgeanna Lea, MD  Naproxen Sodium (ALEVE PO) Take 2 tablets by mouth daily as needed (pain).    [provider]  nitroGLYCERIN (NITROSTAT) 0.4 MG SL tablet Place 1 tablet (0.4 mg total) under the tongue every 5 (five) minutes as needed for chest pain. 12/20/18   Georgeanna Lea, MD    Family History Family History  Problem Relation Age of Onset  . Uterine cancer Mother   . Hypertension Brother   . Heart disease Brother   . Breast cancer Cousin     Social History Social History   Tobacco Use  . Smoking status: Never Smoker  . Smokeless tobacco: Never Used  Substance Use Topics  . Alcohol use: Yes  Alcohol/week: 0.0 standard drinks    Comment: "glass of wine every now and then"  . Drug use: No     Allergies   Patient has no known allergies.   Review of Systems Review of Systems  All other systems reviewed and are negative.    Physical Exam Updated Vital Signs BP (!) 132/93 (BP Location: Right Arm)   Pulse (!) 115   Temp 98.2 F (36.8 C) (Oral)   Resp 16   Ht 5\' 2"  (1.575 m)   Wt 59 kg   SpO2 96%   BMI 23.78 kg/m   Physical Exam Vitals signs and nursing note reviewed.    78 year old female, resting comfortably and in no acute distress. Vital signs are significant for rapid heart rate and elevated diastolic blood pressure. Oxygen saturation is 96%, which is normal. Head is normocephalic and atraumatic. PERRLA, EOMI. Oropharynx is clear. Neck is nontender and supple without adenopathy or JVD. Back is nontender and there is no CVA tenderness. Lungs are clear without rales, wheezes, or  rhonchi. Chest is nontender. Heart is tachycardic and irregular without murmur. Abdomen is soft, flat, nontender without masses or hepatosplenomegaly and peristalsis is normoactive. Extremities have trace edema, full range of motion is present. Skin is warm and dry without rash. Neurologic: Mental status is normal, cranial nerves are intact, there are no motor or sensory deficits.  ED Treatments / Results  Labs (all labs ordered are listed, but only abnormal results are displayed) Labs Reviewed  BASIC METABOLIC PANEL - Abnormal; Notable for the following components:      Result Value   Glucose, Bld 129 (*)    GFR calc non Af Amer 59 (*)    Anion gap 4 (*)    All other components within normal limits  CBC  MAGNESIUM    EKG EKG Interpretation  Date/Time:  Thursday January 25 2019 23:15:02 EST Ventricular Rate:  107 PR Interval:    QRS Duration: 72 QT Interval:  349 QTC Calculation: 457 R Axis:   4 Text Interpretation:  Atrial fibrillation Abnormal R-wave progression, early transition Borderline repolarization abnormality Baseline wander in lead(s) I II aVR aVF When compared with ECG of 08/06/2016, Atrial fibrillation has replaced Accelerated Junctional rhythm Confirmed by Dione Booze ([id number]) on 01/25/2019 11:20:44 PM   EKG Interpretation  Date/Time:  Friday January 26 2019 00:36:50 EST Ventricular Rate:  51 PR Interval:    QRS Duration: 90 QT Interval:  424 QTC Calculation: 391 R Axis:   4 Text Interpretation:  Sinus rhythm Probable anterior infarct, age indeterminate Baseline wander in lead(s) V2 When compared with ECG of 01/25/2019, Sinus rhythm has replaced Atrial fibrillation Confirmed by Dione Booze ([id number]) on 01/26/2019 12:57:13 AM       Radiology Dg Chest 2 View  Result Date: 01/26/2019 CLINICAL DATA:  Palpitations EXAM: CHEST - 2 VIEW COMPARISON:  08/06/2016 FINDINGS: Borderline to mild cardiomegaly. No acute consolidation or effusion. No pneumothorax.  IMPRESSION: No active cardiopulmonary disease.  Borderline cardiomegaly Electronically Signed   By: Jasmine Pang M.D.   On: 01/26/2019 00:07    Procedures .Cardioversion Date/Time: 01/26/2019 12:35 AM Performed by: Dione Booze, MD Authorized by: Dione Booze, MD   Consent:    Consent obtained:  Verbal   Consent given by:  Patient   Risks discussed:  Cutaneous burn, induced arrhythmia and death   Alternatives discussed:  Alternative treatment Pre-procedure details:    Cardioversion basis:  Elective   Rhythm:  Atrial fibrillation  Electrode placement:  Anterior-posterior Patient sedated: Yes. Refer to sedation procedure documentation for details of sedation.  Attempt one:    Cardioversion mode:  Synchronous   Waveform:  Biphasic   Shock (Joules):  120   Shock outcome:  Conversion to normal sinus rhythm Post-procedure details:    Patient status:  Awake   Patient tolerance of procedure:  Tolerated well, no immediate complications .Sedation Date/Time: 01/26/2019 12:35 AM Performed by: Dione BoozeGlick, Sanaiyah Kirchhoff, MD Authorized by: Dione BoozeGlick, Zhanae Proffit, MD   Consent:    Consent obtained:  Verbal   Consent given by:  Patient   Risks discussed:  Allergic reaction, dysrhythmia, inadequate sedation, nausea, prolonged hypoxia resulting in organ damage, prolonged sedation necessitating reversal, respiratory compromise necessitating ventilatory assistance and intubation and vomiting   Alternatives discussed:  Analgesia without sedation, anxiolysis and regional anesthesia Universal protocol:    Procedure explained and questions answered to patient or proxy's satisfaction: yes     Relevant documents present and verified: yes     Test results available and properly labeled: yes     Imaging studies available: yes     Required blood products, implants, devices, and special equipment available: yes     Site/side marked: yes     Immediately prior to procedure a time out was called: yes     Patient identity  confirmation method:  Verbally with patient Indications:    Procedure performed:  Cardioversion   Procedure necessitating sedation performed by:  Physician performing sedation Pre-sedation assessment:    Time since last food or drink:  6 hours   ASA classification: class 2 - patient with mild systemic disease     Neck mobility: normal     Mouth opening:  3 or more finger widths   Thyromental distance:  4 finger widths   Mallampati score:  I - soft palate, uvula, fauces, pillars visible   Pre-sedation assessments completed and reviewed: airway patency, cardiovascular function, hydration status, mental status, nausea/vomiting, pain level, respiratory function and temperature     Pre-sedation assessment completed:  01/26/2019 12:15 AM Immediate pre-procedure details:    Reassessment: Patient reassessed immediately prior to procedure     Reviewed: vital signs, relevant labs/tests and NPO status     Verified: bag valve mask available, emergency equipment available, intubation equipment available, IV patency confirmed, oxygen available and suction available   Procedure details (see MAR for exact dosages):    Preoxygenation:  Nasal cannula   Sedation:  Propofol   Intra-procedure monitoring:  Blood pressure monitoring, cardiac monitor, continuous pulse oximetry, frequent LOC assessments, frequent vital sign checks and continuous capnometry   Intra-procedure events: none     Total Provider sedation time (minutes):  30 Post-procedure details:    Post-sedation assessment completed:  01/26/2019 12:45 AM   Attendance: Constant attendance by certified staff until patient recovered     Recovery: Patient returned to pre-procedure baseline     Post-sedation assessments completed and reviewed: airway patency, cardiovascular function, hydration status, mental status, nausea/vomiting, pain level, respiratory function and temperature     Patient is stable for discharge or admission: yes     Patient tolerance:   Tolerated well, no immediate complications   CRITICAL CARE Performed by: Dione Boozeavid Latressa Harries Total critical care time: 40 minutes Critical care time was exclusive of separately billable procedures and treating other patients. Critical care was necessary to treat or prevent imminent or life-threatening deterioration. Critical care was time spent personally by me on the following activities: development of treatment plan with patient  and/or surrogate as well as nursing, discussions with consultants, evaluation of patient's response to treatment, examination of patient, obtaining history from patient or surrogate, ordering and performing treatments and interventions, ordering and review of laboratory studies, ordering and review of radiographic studies, pulse oximetry and re-evaluation of patient's condition.  Medications Ordered in ED Medications - No data to display   Initial Impression / Assessment and Plan / ED Course  I have reviewed the triage vital signs and the nursing notes.  Pertinent labs & imaging results that were available during my care of the patient were reviewed by me and considered in my medical decision making (see chart for details).  Atrial fibrillation with rapid ventricular response.  ECG shows atrial fibrillation with rapid ventricular response.  Prior ECG had what appeared to be an accelerated junctional tachycardia, but could conceivably have been atrial flutter.  No clear history of atrial fibrillation in the hospital record, no prior ECGs clearly showing atrial fibrillation.  We will proceed with cardioversion and initiation of anticoagulation.  Of note, patient is reluctant to start anticoagulation because her husband had an intracranial bleed while on apixaban.  I have encouraged her to allow anticoagulation for at least the first 30 days and advised that she could then discuss continuation of anticoagulation with her cardiologist.  She was successfully cardioverted to sinus  rhythm.  She is observed in the ED and maintained a stable rhythm and was felt to be safe for discharge.  She is discharged with a prescription for apixaban, referred to the atrial fibrillation clinic for follow-up.  CHA2DS2/VAS Stroke Risk Points       >= 2 Points: High Risk  1 - 1.99 Points: Medium Risk  0 Points: Low Risk    Her score is 5 (2 points for age, 1 point each for hypertension, female sex, and coronary artery disease).:   Last Change: N/A     This score determines the patient's risk of having a stroke if the  patient has atrial fibrillation.      This score is not applicable to this patient. Components are not  calculated.      Final Clinical Impressions(s) / ED Diagnoses   Final diagnoses:  None    ED Discharge Orders    None       Dione Booze, MD 01/26/19 [phone]

## 2019-01-26 ENCOUNTER — Telehealth (HOSPITAL_COMMUNITY): Payer: Self-pay | Admitting: *Deleted

## 2019-01-26 DIAGNOSIS — R002 Palpitations: Secondary | ICD-10-CM | POA: Diagnosis not present

## 2019-01-26 LAB — BASIC METABOLIC PANEL
Anion gap: 4 — ABNORMAL LOW (ref 5–15)
BUN: 15 mg/dL (ref 8–23)
CO2: 28 mmol/L (ref 22–32)
Calcium: 9.2 mg/dL (ref 8.9–10.3)
Chloride: 110 mmol/L (ref 98–111)
Creatinine, Ser: 0.94 mg/dL (ref 0.44–1.00)
GFR calc Af Amer: >60 mL/min (ref >60)
GFR calc non Af Amer: 59 mL/min — ABNORMAL LOW (ref >60)
Glucose, Bld: 129 mg/dL — ABNORMAL HIGH (ref 70–99)
Potassium: 4.1 mmol/L (ref 3.5–5.1)
Sodium: 142 mmol/L (ref 135–145)

## 2019-01-26 LAB — CBC
HCT: 42.9 % (ref 36.0–46.0)
Hemoglobin: 14.1 g/dL (ref 12.0–15.0)
MCH: 31.1 pg (ref 26.0–34.0)
MCHC: 32.9 g/dL (ref 30.0–36.0)
MCV: 94.5 fL (ref 80.0–100.0)
Platelets: 294 10*3/uL (ref 150–400)
RBC: 4.54 MIL/uL (ref 3.87–5.11)
RDW: 13.3 % (ref 11.5–15.5)
WBC: 6.4 10*3/uL (ref 4.0–10.5)
nRBC: 0 % (ref 0.0–0.2)

## 2019-01-26 LAB — MAGNESIUM: Magnesium: 2.1 mg/dL (ref 1.7–2.4)

## 2019-01-26 MED ORDER — APIXABAN 5 MG PO TABS: 5.0000 mg | ORAL_TABLET | Freq: Two times a day (BID) | ORAL | 0 refills | Status: DC

## 2019-01-26 NOTE — ED Notes (Signed)
Patient verbalizes understanding of discharge instructions. Opportunity for questioning and answers were provided. Armband removed by staff, pt discharged from ED in wheelchair with daughter.   

## 2019-01-26 NOTE — Telephone Encounter (Signed)
LMOM for pt to call to schedule for follow up appt since dccv in ED

## 2019-01-26 NOTE — Progress Notes (Signed)
RT at bedside for cardioversion. Pt tolerated procedure, with ETCO2 (pre) 38cmH20, and 30cmH20 post procedure.

## 2019-01-29 DIAGNOSIS — I2510 Atherosclerotic heart disease of native coronary artery without angina pectoris: Secondary | ICD-10-CM | POA: Diagnosis not present

## 2019-01-29 DIAGNOSIS — E7849 Other hyperlipidemia: Secondary | ICD-10-CM | POA: Diagnosis not present

## 2019-01-29 DIAGNOSIS — I10 Essential (primary) hypertension: Secondary | ICD-10-CM | POA: Diagnosis not present

## 2019-01-29 DIAGNOSIS — I4891 Unspecified atrial fibrillation: Secondary | ICD-10-CM | POA: Diagnosis not present

## 2019-01-30 ENCOUNTER — Encounter (HOSPITAL_COMMUNITY): Payer: Self-pay | Admitting: Physician Assistant

## 2019-01-30 ENCOUNTER — Ambulatory Visit (HOSPITAL_COMMUNITY)
Admission: RE | Admit: 2019-01-30 | Discharge: 2019-01-30 | Disposition: A | Discharge status: Home / Self Care | Payer: Medicare Other | Source: Ambulatory Visit | Attending: Physician Assistant | Admitting: Physician Assistant

## 2019-01-30 VITALS — BP 92/56 | HR 60 | Ht 62.0 in | Wt 146.0 lb

## 2019-01-30 DIAGNOSIS — I2510 Atherosclerotic heart disease of native coronary artery without angina pectoris: Secondary | ICD-10-CM | POA: Diagnosis not present

## 2019-01-30 DIAGNOSIS — Z79899 Other long term (current) drug therapy: Secondary | ICD-10-CM | POA: Diagnosis not present

## 2019-01-30 DIAGNOSIS — I10 Essential (primary) hypertension: Secondary | ICD-10-CM | POA: Diagnosis not present

## 2019-01-30 DIAGNOSIS — Z7901 Long term (current) use of anticoagulants: Secondary | ICD-10-CM | POA: Insufficient documentation

## 2019-01-30 DIAGNOSIS — I480 Paroxysmal atrial fibrillation: Secondary | ICD-10-CM | POA: Diagnosis not present

## 2019-01-30 DIAGNOSIS — E7800 Pure hypercholesterolemia, unspecified: Secondary | ICD-10-CM | POA: Diagnosis not present

## 2019-01-30 DIAGNOSIS — E785 Hyperlipidemia, unspecified: Secondary | ICD-10-CM | POA: Diagnosis not present

## 2019-01-30 DIAGNOSIS — Z8249 Family history of ischemic heart disease and other diseases of the circulatory system: Secondary | ICD-10-CM | POA: Diagnosis not present

## 2019-01-30 LAB — TSH: TSH: 1.574 u[IU]/mL (ref 0.350–4.500)

## 2019-01-30 NOTE — Progress Notes (Signed)
Primary Care Physician: Chilton Greathouse, MD Primary Cardiologist: Dr Jacinto Halim Referring Physician: [facility]   Erica Frank is a 78 y.o. female with a history of paroxysmal atrial fibrillation, CAD, HTN, and HLD who presents for consultation in the [facility]. The patient was initially diagnosed with atrial fibrillation on 01/25/19 after presenting with symptoms of SOB and heart racing. There were no triggers that she could identify. She was taken by EMS to the ER where she was found to be in afib with RVR. She underwent successful DCCV. She has not had any further episodes. She is currently on Eliquis. She denies significant alcohol use or snoring. Of note, she has been taking her lisinopril BID and has had some dizziness.   Today, she denies symptoms of palpitations, chest pain, shortness of breath, orthopnea, PND, lower extremity edema, presyncope, syncope, snoring, daytime somnolence, bleeding, or neurologic sequela. The patient is tolerating medications without difficulties and is otherwise without complaint today.    Atrial Fibrillation Risk Factors:  she does not have symptoms or diagnosis of sleep apnea. she does not have a history of rheumatic fever. she does not have a history of alcohol use. The patient does not have a history of early familial atrial fibrillation or other arrhythmias.  she has a BMI of Body mass index is 26.7 kg/m.Marland Kitchen Filed Weights   01/30/19 1534  Weight: 66.2 kg    Family History  Problem Relation Age of Onset  . Uterine cancer Mother   . Hypertension Brother   . Heart disease Brother   . Breast cancer Cousin      Atrial Fibrillation Management history:  Previous antiarrhythmic drugs: none Previous cardioversions: 01/25/19 Previous ablations: none CHADS2VASC score: 5 (age, female, HTN, CAD) Anticoagulation history: Eliquis   Past Medical History:  Diagnosis Date  . Arthritis   . Atrial fibrillation (HCC)   .  Coronary artery disease   . Heart disease   . High cholesterol   . Hypertension    Past Surgical History:  Procedure Laterality Date  . ABDOMINAL HYSTERECTOMY  1983   for endometriosis with Burch  . ANGIOPLASTY    . ANGIOPLASTY    . BACK SURGERY    . CARDIAC SURGERY     Catheterization  . NECK SURGERY      Current Outpatient Medications  Medication Sig Dispense Refill  . apixaban (ELIQUIS) 5 MG TABS tablet Take 1 tablet (5 mg total) by mouth 2 (two) times daily. 60 tablet 0  . ezetimibe-simvastatin (VYTORIN) 10-40 MG tablet Take 1 tablet by mouth daily. 90 tablet 1  . lisinopril (PRINIVIL,ZESTRIL) 10 MG tablet Take 1 tablet (10 mg total) by mouth daily. (Patient taking differently: Take 10 mg by mouth 2 (two) times daily. ) 90 tablet 1  . nitroGLYCERIN (NITROSTAT) 0.4 MG SL tablet Place 1 tablet (0.4 mg total) under the tongue every 5 (five) minutes as needed for chest pain. (Patient not taking: Reported on 01/30/2019) 25 tablet 11   No current facility-administered medications for this encounter.     No Known Allergies  Social History   Socioeconomic History  . Marital status: Married    Spouse name: Not on file  . Number of children: Not on file  . Years of education: Not on file  . Highest education level: Not on file  Occupational History  . Not on file  Social Needs  . Financial resource strain: Not on file  . Food insecurity:  Worry: Not on file    Inability: Not on file  . Transportation needs:    Medical: Not on file    Non-medical: Not on file  Tobacco Use  . Smoking status: Never Smoker  . Smokeless tobacco: Never Used  Substance and Sexual Activity  . Alcohol use: Yes    Alcohol/week: 0.0 standard drinks    Comment: "glass of wine every now and then"  . Drug use: No  . Sexual activity: Yes    Birth control/protection: Post-menopausal    Comment: 1st intercourse 60 yo-1 partners  Lifestyle  . Physical activity:    Days per week: Not on file     Minutes per session: Not on file  . Stress: Not on file  Relationships  . Social connections:    Talks on phone: Not on file    Gets together: Not on file    Attends religious service: Not on file    Active member of club or organization: Not on file    Attends meetings of clubs or organizations: Not on file    Relationship status: Not on file  . Intimate partner violence:    Fear of current or ex partner: Not on file    Emotionally abused: Not on file    Physically abused: Not on file    Forced sexual activity: Not on file  Other Topics Concern  . Not on file  Social History Narrative  . Not on file     ROS- All systems are reviewed and negative except as per the HPI above.  Physical Exam: Vitals:   01/30/19 1534  BP: (!) 92/56  Pulse: 60  Weight: 66.2 kg  Height: 5\' 2"  (1.575 m)    GEN- The patient is well appearing elderly woman, alert and oriented x 3 today.   Head- normocephalic, atraumatic Eyes-  Sclera clear, conjunctiva pink Ears- hearing intact Oropharynx- clear Neck- supple  Lungs- Clear to ausculation bilaterally, normal work of breathing Heart- Regular rate and rhythm, no murmurs, rubs or gallops  GI- soft, NT, ND, + BS Extremities- no clubbing, cyanosis, or edema MS- no significant deformity or atrophy Skin- no rash or lesion Psych- euthymic mood, full affect Neuro- strength and sensation are intact  Wt Readings from Last 3 Encounters:  01/30/19 66.2 kg  01/25/19 59 kg  12/20/18 66 kg    EKG today demonstrates SR HR 60, ?old sept infarct, NST, PR 144, QRS 64, QTc 410  Epic records are reviewed at length today  Assessment and Plan:  1. Paroxysmal atrial fibrillation The patient has new onset paroxysmal atrial fibrillation.  S/p DCCV 01/25/19. Appropriately anticoagulated on Eliquis. Check echocardiogram Recent Bmet/mag at ER unremarkable.  Check TSH Continue Eliquis 5 mg BID  This patients CHA2DS2-VASc Score and unadjusted Ischemic Stroke  Rate (% per year) is equal to 7.2 % stroke rate/year from a score of 5  Above score calculated as 1 point each if present [CHF, HTN, DM, Vascular=MI/PAD/Aortic Plaque, Age if 65-74, or Female] Above score calculated as 2 points each if present [Age > 75, or Stroke/TIA/TE]  2. CAD Angioplasty of LAD in 1989 and 1990 10/2017 Lexiscan showed no ischemia. Gated EF 66%. No anginal symptoms.  3. HTN Patient appears hypotensive today. She has had some dizzy symptoms lately. Will decrease lisinopril to 10 mg daily.   Follow up with Dr Jacinto Halim as scheduled. Return to afib clinic in one month for labs.   Jorja Loa PA-C Afib Clinic [facility]  [address] [place], [place] [address] [phone] 01/30/2019 4:19 PM

## 2019-02-09 ENCOUNTER — Other Ambulatory Visit: Payer: Self-pay | Admitting: Cardiology

## 2019-02-15 ENCOUNTER — Ambulatory Visit: Payer: Medicare Other | Admitting: Cardiology

## 2019-02-15 ENCOUNTER — Encounter: Payer: Self-pay | Admitting: Cardiology

## 2019-02-15 ENCOUNTER — Other Ambulatory Visit: Payer: Self-pay

## 2019-02-15 VITALS — BP 113/66 | HR 57 | Ht 62.5 in | Wt 143.0 lb

## 2019-02-15 DIAGNOSIS — I480 Paroxysmal atrial fibrillation: Secondary | ICD-10-CM | POA: Insufficient documentation

## 2019-02-15 DIAGNOSIS — E782 Mixed hyperlipidemia: Secondary | ICD-10-CM | POA: Diagnosis not present

## 2019-02-15 DIAGNOSIS — I10 Essential (primary) hypertension: Secondary | ICD-10-CM | POA: Diagnosis not present

## 2019-02-15 DIAGNOSIS — I2510 Atherosclerotic heart disease of native coronary artery without angina pectoris: Secondary | ICD-10-CM | POA: Diagnosis not present

## 2019-02-15 DIAGNOSIS — R001 Bradycardia, unspecified: Secondary | ICD-10-CM

## 2019-02-15 NOTE — Progress Notes (Signed)
Patient referred by Avva, Ravisankar for A fib.  Subjective:   _0  ID: Erica Frank, female    DOB: Jun 24, 1941, 78 y.o.   MRN: [id number]  Chief Complaint  Patient presents with  . Atrial Fibrillation    NP Eval    HPI: Patient with hypertension, hyperlipidemia, and paroxysmal atrial fibrillation referred to [place] for management of A fib.   Patient reports that on 02/20 she had fallen asleep on the couch and was suddenly woken around 9:30 PM with heart racing. She had associated shortness of breath. She went upstairs and awoke her daughter who is a Marine scientist and she called EMS after she was unable to count her pulse as it was going so fast. Once in the ER she underwent DCCV x1 with 120J that converted to sinus rhythm. She is now on anticoagulation. She denies any previous history of A fib, but does state that she had an episode a few years ago that may have been A fib.  She denies any chest pain or shortness of breath. Does have some symptoms of GERD on occasion. Has not had any known reoccurrence of A fib.   She has hypertension that she states started a few years ago, hyperlipidemia that is controlled with statin and Zetia, no history of diabetes or thyroid disorder. She denies any chest pain or dyspnea on exertion. Has occasional tightness is epigastric region that she feels is related to GERD. No recent palpitations. No syncope. She does have occasional episodes of lightheadedness with bending over or looking downward.   Patient does not exercise, but is fairly active with maintaining her house. She was previously swimming until 1-2 years ago after her husband passed away.     Past Medical History:  Diagnosis Date  . Anxiety   . Arthritis   . Atherosclerosis   . Atrial fibrillation ([place])   . Coronary artery disease   . Heart disease   . High cholesterol   . Hypertension     Past Surgical History:  Procedure Laterality Date  . ABDOMINAL HYSTERECTOMY  1983   for  endometriosis with Burch  . ANGIOPLASTY    . ANGIOPLASTY    . BACK SURGERY    . CARDIAC SURGERY     Catheterization  . NECK SURGERY      Social History   Socioeconomic History  . Marital status: Married    Spouse name: Not on file  . Number of children: 1  . Years of education: Not on file  . Highest education level: Not on file  Occupational History  . Not on file  Social Needs  . Financial resource strain: Not on file  . Food insecurity:    Worry: Not on file    Inability: Not on file  . Transportation needs:    Medical: Not on file    Non-medical: Not on file  Tobacco Use  . Smoking status: Never Smoker  . Smokeless tobacco: Never Used  Substance and Sexual Activity  . Alcohol use: Yes    Comment: Occasional  . Drug use: No  . Sexual activity: Yes    Birth control/protection: Post-menopausal    Comment: 1st intercourse 58 yo-1 partners  Lifestyle  . Physical activity:    Days per week: Not on file    Minutes per session: Not on file  . Stress: Not on file  Relationships  . Social connections:    Talks on phone: Not on file    Gets together:  Not on file    Attends religious service: Not on file    Active member of club or organization: Not on file    Attends meetings of clubs or organizations: Not on file    Relationship status: Not on file  . Intimate partner violence:    Fear of current or ex partner: Not on file    Emotionally abused: Not on file    Physically abused: Not on file    Forced sexual activity: Not on file  Other Topics Concern  . Not on file  Social History Narrative  . Not on file    Current Outpatient Medications on File Prior to Visit  Medication Sig Dispense Refill  . apixaban (ELIQUIS) 5 MG TABS tablet Take 1 tablet (5 mg total) by mouth 2 (two) times daily. 60 tablet 0  . Calcium Carbonate-Vit D-Min (CALCIUM 1200 PO) Take 1 tablet by mouth daily.    Marland Kitchen co-enzyme Q-10 30 MG capsule Take 30 mg by mouth 3 (three) times daily.    .  Cyanocobalamin (VITAMIN B 12 PO) Take 1 tablet by mouth daily.    Marland Kitchen ezetimibe-simvastatin (VYTORIN) 10-40 MG tablet TAKE 1 TABLET BY MOUTH DAILY 90 tablet 1  . lisinopril (PRINIVIL,ZESTRIL) 10 MG tablet Take 1 tablet (10 mg total) by mouth daily. (Patient taking differently: Take 10 mg by mouth 2 (two) times daily. ) 90 tablet 1  . Multiple Vitamins-Minerals (MULTIVITAMIN ADULT PO) Take 1 tablet by mouth daily.    . nitroGLYCERIN (NITROSTAT) 0.4 MG SL tablet Place 1 tablet (0.4 mg total) under the tongue every 5 (five) minutes as needed for chest pain. 25 tablet 11  . TURMERIC PO Take 1 tablet by mouth daily.     No current facility-administered medications on file prior to visit.    EKG 02/15/2019: Sinus bradycardia at 51 bpm, normal axis, nonspecific T wave abnormality.   Angioplasty of LAD in 1989 and 1990  10/2017 Lexiscan showed no ischemia. Gated EF 66%. No anginal symptoms.  DCCV 01/25/2019: 120J x 1 to NSR.  CBC Latest Ref Rng & Units 01/25/2019  WBC 4.0 - 10.5 K/uL 6.4  Hemoglobin 12.0 - 15.0 g/dL 14.1  Hematocrit 36.0 - 46.0 % 42.9  Platelets 150 - 400 K/uL 294   CMP Latest Ref Rng & Units 01/25/2019  Glucose 70 - 99 mg/dL 129(H)  BUN 8 - 23 mg/dL 15  Creatinine 0.44 - 1.00 mg/dL 0.94  Sodium 135 - 145 mmol/L 142  Potassium 3.5 - 5.1 mmol/L 4.1  Chloride 98 - 111 mmol/L 110  CO2 22 - 32 mmol/L 28  Calcium 8.9 - 10.3 mg/dL 9.2  Total Protein 6.0 - 8.3 g/dL -  Total Bilirubin 0.3 - 1.2 mg/dL -  Alkaline Phos 39 - 117 U/L -  AST 0 - 37 U/L -  ALT 0 - 35 U/L -    11/03/2018:   Glucose 97, BUN/Cr 25/0.9, eGFR 60.9/73.7, Na/K 141/4.6, AST 22, ALT 19, Alk Phos 55, Bil Tot 0.6, Apo B 63. Rest of CMP normal.  Chol 155, Trig 63, HDL 71, LDL 71. TSH Normal.   Review of Systems  Constitutional: Negative for fatigue and unexpected weight change.  HENT: Negative for congestion.   Eyes: Negative for visual disturbance.  Respiratory: Negative for shortness of breath.    Cardiovascular: Negative for chest pain, palpitations and leg swelling.  Gastrointestinal: Negative for abdominal pain, blood in stool, nausea and vomiting.  Endocrine: Negative for cold intolerance.  Genitourinary: Negative  for dysuria.  Musculoskeletal: Positive for back pain. Negative for myalgias.  Skin: Negative for rash.  Allergic/Immunologic: Negative for immunocompromised state.  Neurological: Positive for dizziness (occasional with bending over). Negative for syncope.  Hematological: Does not bruise/bleed easily.  Psychiatric/Behavioral: The patient is not nervous/anxious.   All other systems reviewed and are negative.      Objective:   Physical Exam Vitals signs and nursing note reviewed.  Constitutional:      General: She is not in acute distress.    Appearance: Normal appearance. She is not toxic-appearing.  HENT:     Head: Normocephalic and atraumatic.     Nose: No congestion.     Mouth/Throat:     Mouth: Mucous membranes are dry.  Eyes:     Pupils: Pupils are equal, round, and reactive to light.  Neck:     Musculoskeletal: Neck supple. No muscular tenderness.  Cardiovascular:     Rate and Rhythm: Regular rhythm. Bradycardia present.     Pulses: Normal pulses.     Heart sounds: Normal heart sounds.  Pulmonary:     Effort: Pulmonary effort is normal. No respiratory distress.     Breath sounds: Normal breath sounds.  Abdominal:     General: Abdomen is flat. Bowel sounds are normal. There is no distension.     Palpations: Abdomen is soft.  Musculoskeletal:        General: No tenderness.  Lymphadenopathy:     Cervical: No cervical adenopathy.  Skin:    General: Skin is warm and dry.  Neurological:     General: No focal deficit present.     Mental Status: She is alert and oriented to person, place, and time. Mental status is at baseline.  Psychiatric:        Mood and Affect: Mood normal.     Today's Vitals   02/15/19 1432  BP: 113/66  Pulse: (!) 57   SpO2: 95%  Weight: 143 lb (64.9 kg)  Height: 5' 2.5" (1.588 m)   Body mass index is 25.74 kg/m.       Assessment & Recommendations:  Paroxysmal atrial fibrillation ([place]) - Plan: EKG 12-Lead, PCV ECHOCARDIOGRAM COMPLETE, PCV MYOCARDIAL PERFUSION WITH LEXISCAN  Atherosclerosis of native coronary artery of native heart without angina pectoris  Essential hypertension  Mixed hyperlipidemia  Bradycardia  Recommendations: Patient with history of CAD with PTCA in 1989 and again in 1990, hypertension, hyperlipidemia, and osteoarthritis referred to [place] for management of new onset atrial fibrillation and to establish care.  Since undergoing cardioversion in the emergency room, patient has been maintaining sinus rhythm.  Has not had any known recurrence of A. fib.  She is currently only on anticoagulation in which she is tolerating well without any bleeding diathesis.  She does use Aleve on occasion for her arthritis.  Advised her that occasional sporadic use of ibuprofen is okay to use with anticoagulation, would not recommend daily use.  Would rather her use Tylenol as needed.  In view of her CAD history and new onset A. fib, ischemic etiology should be excluded.  She is without symptoms of angina; however, over the last 2 years has not exercised, therefore assessment is limited.  Would recommend Lexiscan nuclear stress testing for further evaluation.  She is unable to walk on the treadmill due to osteoarthritis in her knees.  We will also obtain echocardiogram to exclude any structural abnormalities.  Blood pressure is well controlled with lisinopril.  She is on statin as well as Zetia  for hyperlipidemia that is being managed by her PCP.  She does have bradycardia noted on EKG that appears to be chronic and likely related to previous exercise.  She is asymptomatic in regards to this.  Physical exam is unremarkable.  I will see her back after stress testing and echocardiogram for reevaluation and  further recommendations.  Unless testing is abnormal, after next follow-up will consider following up on a six-month basis.   Thank you for referring the patient to [place]. Please feel free to contact with any questions.  Jeri Lager, MSN, APRN, FNP-C 02/15/2019, 2:49 PM [facility]. [place] Office: [phone]

## 2019-02-16 ENCOUNTER — Encounter: Payer: Self-pay | Admitting: Cardiology

## 2019-02-26 ENCOUNTER — Ambulatory Visit: Payer: Medicare Other

## 2019-02-26 ENCOUNTER — Other Ambulatory Visit: Payer: Self-pay

## 2019-02-26 DIAGNOSIS — I4891 Unspecified atrial fibrillation: Secondary | ICD-10-CM | POA: Diagnosis not present

## 2019-02-26 DIAGNOSIS — I480 Paroxysmal atrial fibrillation: Secondary | ICD-10-CM

## 2019-02-27 ENCOUNTER — Other Ambulatory Visit: Payer: Medicare Other

## 2019-03-01 ENCOUNTER — Other Ambulatory Visit (HOSPITAL_COMMUNITY): Payer: Medicare Other | Admitting: Physician Assistant

## 2019-03-15 ENCOUNTER — Other Ambulatory Visit: Payer: Self-pay | Admitting: Cardiology

## 2019-03-20 ENCOUNTER — Ambulatory Visit: Payer: Medicare Other | Admitting: Cardiology

## 2019-03-27 DIAGNOSIS — L821 Other seborrheic keratosis: Secondary | ICD-10-CM | POA: Diagnosis not present

## 2019-03-27 DIAGNOSIS — L570 Actinic keratosis: Secondary | ICD-10-CM | POA: Diagnosis not present

## 2019-04-02 ENCOUNTER — Other Ambulatory Visit: Payer: Medicare Other

## 2019-04-09 ENCOUNTER — Ambulatory Visit: Payer: Medicare Other | Admitting: Cardiology

## 2019-04-18 ENCOUNTER — Other Ambulatory Visit: Payer: Medicare Other

## 2019-04-19 ENCOUNTER — Other Ambulatory Visit: Payer: Medicare Other

## 2019-04-23 ENCOUNTER — Telehealth: Payer: Self-pay

## 2019-04-23 ENCOUNTER — Other Ambulatory Visit: Payer: Self-pay

## 2019-04-23 ENCOUNTER — Ambulatory Visit (INDEPENDENT_AMBULATORY_CARE_PROVIDER_SITE_OTHER): Payer: Medicare Other

## 2019-04-23 DIAGNOSIS — I480 Paroxysmal atrial fibrillation: Secondary | ICD-10-CM | POA: Diagnosis not present

## 2019-04-23 NOTE — Telephone Encounter (Signed)
Pt was in office having an echo and advised Erica Frank that her Eliquis was making her feel drained/no energy and she wanted to let you know.//ah

## 2019-04-23 NOTE — Telephone Encounter (Signed)
Pt is not having any bleeding. States that the tiredness started when she started taking the Eliquis.//ah

## 2019-04-23 NOTE — Telephone Encounter (Signed)
Not sure if it is related, we could switch to Xarelto. [place] if any bleeding

## 2019-04-26 ENCOUNTER — Other Ambulatory Visit: Payer: Self-pay

## 2019-04-26 ENCOUNTER — Encounter: Payer: Self-pay | Admitting: Cardiology

## 2019-04-26 ENCOUNTER — Ambulatory Visit (INDEPENDENT_AMBULATORY_CARE_PROVIDER_SITE_OTHER): Payer: Medicare Other | Admitting: Cardiology

## 2019-04-26 VITALS — BP 103/58 | HR 61 | Temp 98.8°F | Ht 62.0 in | Wt 143.0 lb

## 2019-04-26 DIAGNOSIS — I480 Paroxysmal atrial fibrillation: Secondary | ICD-10-CM | POA: Diagnosis not present

## 2019-04-26 DIAGNOSIS — I2510 Atherosclerotic heart disease of native coronary artery without angina pectoris: Secondary | ICD-10-CM

## 2019-04-26 DIAGNOSIS — I10 Essential (primary) hypertension: Secondary | ICD-10-CM

## 2019-04-26 NOTE — Progress Notes (Signed)
Primary Physician/Referring:  Prince Solian, MD  Patient ID: Erica Frank, female    DOB: [birth year], 78 y.o.   MRN: [id number]  Chief Complaint  Patient presents with  . Results    echo  . Follow-up    4wk    HPI: Erica Frank  is a 78 y.o. female  with CAD and balloon angioplasty in [place], hypertension, hyperlipidemia, and paroxysmal atrial fibrillation. Episode was on 01/25/2019 when she woke up with palpitations and was taken to the emergency room.  She underwent direct current cardioversion and was discharged home.  I seen her on 02/15/2019, she was continued on Eliquis, she now presents here for follow-up. Due to underlying known coronary disease, ischemic workup was performed including echocardiogram and stress test.  Since being on Eliquis she has noticed fatigue.  Lately she's been taking naps in the afternoon and this is not normal for her.  States there are brother also had fatigue with Eliquis.  But in general she has been noticing daytime somnolence.  No chest pain, no dyspnea, no leg edema.  She also states that her blood pressure is all over the place.  Patient does not exercise, but is fairly active with maintaining her house. She was previously swimming until 1-2 years ago after her husband passed away.   Past Medical History:  Diagnosis Date  . Anxiety   . Arthritis   . Atherosclerosis   . Atrial fibrillation ([place])   . Coronary artery disease   . Heart disease   . High cholesterol   . Hypertension     Past Surgical History:  Procedure Laterality Date  . ABDOMINAL HYSTERECTOMY  1983   for endometriosis with Burch  . ANGIOPLASTY    . ANGIOPLASTY    . BACK SURGERY    . CARDIAC SURGERY     Catheterization  . NECK SURGERY      Social History   Socioeconomic History  . Marital status: Widowed    Spouse name: Not on file  . Number of children: 1  . Years of education: Not on file  . Highest education level: Not on file  Occupational History   . Not on file  Social Needs  . Financial resource strain: Not on file  . Food insecurity:    Worry: Not on file    Inability: Not on file  . Transportation needs:    Medical: Not on file    Non-medical: Not on file  Tobacco Use  . Smoking status: Never Smoker  . Smokeless tobacco: Never Used  Substance and Sexual Activity  . Alcohol use: Yes    Comment: Occasional  . Drug use: No  . Sexual activity: Yes    Birth control/protection: Post-menopausal    Comment: 1st intercourse 37 yo-1 partners  Lifestyle  . Physical activity:    Days per week: Not on file    Minutes per session: Not on file  . Stress: Not on file  Relationships  . Social connections:    Talks on phone: Not on file    Gets together: Not on file    Attends religious service: Not on file    Active member of club or organization: Not on file    Attends meetings of clubs or organizations: Not on file    Relationship status: Not on file  . Intimate partner violence:    Fear of current or ex partner: Not on file    Emotionally abused: Not on file  Physically abused: Not on file    Forced sexual activity: Not on file  Other Topics Concern  . Not on file  Social History Narrative  . Not on file    Review of Systems  Constitution: Positive for malaise/fatigue. Negative for chills, decreased appetite and weight gain.  Cardiovascular: Negative for dyspnea on exertion, leg swelling and syncope.  Endocrine: Negative for cold intolerance.  Hematologic/Lymphatic: Does not bruise/bleed easily.  Musculoskeletal: Positive for back pain, joint pain and neck pain. Negative for joint swelling.  Gastrointestinal: Negative for abdominal pain, anorexia, change in bowel habit, hematochezia and melena.  Neurological: Negative for headaches and light-headedness.  Psychiatric/Behavioral: Negative for depression and substance abuse.  All other systems reviewed and are negative.     Objective  Blood pressure (!) 103/58,  pulse 61, temperature 98.8 F (37.1 C), height 5' 2" (1.575 m), weight 143 lb (64.9 kg), SpO2 93 %. Body mass index is 26.16 kg/m.    Physical Exam  Constitutional: She appears well-developed and well-nourished. No distress.  HENT:  Head: Atraumatic.  Eyes: Conjunctivae are normal.  Neck: Neck supple. No JVD present. No thyromegaly present.  Cardiovascular: Normal rate, regular rhythm, normal heart sounds and intact distal pulses. Exam reveals no gallop.  No murmur heard. Pulmonary/Chest: Effort normal and breath sounds normal.  Abdominal: Soft. Bowel sounds are normal.  Musculoskeletal: Normal range of motion.  Neurological: She is alert.  Skin: Skin is warm and dry.  Psychiatric: She has a normal mood and affect.   Radiology: No results found.  Laboratory examination:   11/03/2018:   Glucose 97, BUN/Cr 25/0.9, eGFR 60.9/73.7, Na/K 141/4.6, AST 22, ALT 19, Alk Phos 55, Bil Tot 0.6, Apo B 63. Rest of CMP normal.  Chol 155, Trig 63, HDL 71, LDL 71. TSH Normal.   CMP Latest Ref Rng & Units 01/25/2019 08/06/2016 01/24/2015  Glucose 70 - 99 mg/dL 129(H) 152(H) 107(H)  BUN 8 - 23 mg/dL 15 27(H) 19  Creatinine 0.44 - 1.00 mg/dL 0.94 0.88 1.07  Sodium 135 - 145 mmol/L 142 137 138  Potassium 3.5 - 5.1 mmol/L 4.1 3.4(L) 3.7  Chloride 98 - 111 mmol/L 110 108 105  CO2 22 - 32 mmol/L _0 Calcium 8.9 - 10.3 mg/dL 9.2 9.1 9.4  Total Protein 6.0 - 8.3 g/dL - - 6.7  Total Bilirubin 0.3 - 1.2 mg/dL - - 0.8  Alkaline Phos 39 - 117 U/L - - 60  AST 0 - 37 U/L - - 29  ALT 0 - 35 U/L - - 32   CBC Latest Ref Rng & Units 01/25/2019 08/06/2016 01/24/2015  WBC 4.0 - 10.5 K/uL 6.4 8.9 7.6  Hemoglobin 12.0 - 15.0 g/dL 14.1 14.7 14.8  Hematocrit 36.0 - 46.0 % 42.9 44.4 43.3  Platelets 150 - 400 K/uL 294 281 231   Lipid Panel  No results found for: CHOL, TRIG, HDL, CHOLHDL, VLDL, LDLCALC, LDLDIRECT HEMOGLOBIN A1C No results found for: HGBA1C, MPG TSH Recent Labs    01/30/19 1611  TSH 1.574     PRN Meds:. There are no discontinued medications. Current Meds  Medication Sig  . acetaminophen (TYLENOL) 500 MG tablet Take 500 mg by mouth every 6 (six) hours as needed.  . Calcium Carbonate-Vit D-Min (CALCIUM 1200 PO) Take 1 tablet by mouth daily as needed.   Marland Kitchen co-enzyme Q-10 30 MG capsule Take 30 mg by mouth daily as needed.   . Cyanocobalamin (VITAMIN B 12 PO) Take 1 tablet by  mouth daily as needed.   Marland Kitchen ELIQUIS 5 MG TABS tablet TAKE 1 TABLET(5 MG) BY MOUTH TWICE DAILY  . ezetimibe-simvastatin (VYTORIN) 10-40 MG tablet TAKE 1 TABLET BY MOUTH DAILY  . lisinopril (PRINIVIL,ZESTRIL) 10 MG tablet Take 1 tablet (10 mg total) by mouth daily.  . Multiple Vitamins-Minerals (MULTIVITAMIN ADULT PO) Take 1 tablet by mouth daily as needed.   . nitroGLYCERIN (NITROSTAT) 0.4 MG SL tablet Place 1 tablet (0.4 mg total) under the tongue every 5 (five) minutes as needed for chest pain.  Marland Kitchen tretinoin (RETIN-A) 0.1 % cream Apply topically daily as needed.  . TURMERIC PO Take 1 tablet by mouth daily.    Cardiac Studies:   Echocardiogram 04/23/2019: Normal LV systolic function with EF 59%. Left ventricle cavity is normal in size. Normal global wall motion. Calculated EF 59%. Moderate (Grade II) mitral regurgitation. E-wave dominant mitral inflow. Mild tricuspid regurgitation. No evidence of pulmonary hypertension.  Lexiscan myoview stress test 02/26/2019:  1. Lexiscan stress test was performed. Exercise capacity was not assessed. No stress symptoms reported. Resting blood pressure was 110/70 mmHg and peak effect blood pressure was 146/60 mmHg. The resting and stress electrocardiogram demonstrated normal sinus rhythm, normal  conduction, occasional PAC, and normal repolarization.   Stress EKG is non diagnostic for ischemia as it is a pharmacologic stress.  2. The overall quality of the study is good. There is no evidence of abnormal lung activity. Stress and rest SPECT images demonstrate homogeneous tracer  distribution throughout the myocardium. Gated SPECT imaging reveals normal myocardial thickening and wall motion. The left ventricular ejection fraction was normal (52%).   3. Low risk study.   Coronary Angiography & Balloon Angioplasty of LAD in 1989 and 1990  DCCV 01/25/2019: 120J x 1 to NSR.  Assessment   Paroxysmal atrial fibrillation (HCC) CHA2DS2-VASc Score is 5. Yearly risk of stroke: 6.7%  Atherosclerosis of native coronary artery of native heart without angina pectoris  Essential hypertension  EKG 02/15/2019: Sinus bradycardia at 51 bpm, normal axis, nonspecific T wave abnormality.  Recommendations:   Patient had called her several times about feeling fatigued since being on Eliquis.  I discussed with her that it is probably unlikely and we discussed regarding sleep apnea.  She seems to think that she has not been sleeping well and would like to wait before contemplating sleep study.  I also offered her to change Eliquis to Xarelto, again should wait for a few months to see how she does.  She has not had any further palpitations or rapid heart rate.  I reviewed the results of the stress test and echocardiogram, balloon angioplasty site must be patent with a normal nuclear stress test and normal LVEF.  Blood pressure is controlled but she has issues with blood pressure elevation and low blood pressure at times.  Advised her that if systolic blood pressure is 100 mmHg she could certainly avoid lisinopril and >140 mmHg, she would always take 1 extra dose.  She seems to be stable from cardiac standpoint otherwise, I will see her back in 6 months.  Adrian Prows, MD, [facility] 04/26/2019, 6:14 PM [facility] [place] Pager: [phone] Office: [phone] If no answer Cell [phone]

## 2019-04-27 ENCOUNTER — Ambulatory Visit: Payer: Medicare Other | Admitting: Cardiology

## 2019-05-08 ENCOUNTER — Other Ambulatory Visit: Payer: Self-pay | Admitting: Internal Medicine

## 2019-05-08 DIAGNOSIS — Z1231 Encounter for screening mammogram for malignant neoplasm of breast: Secondary | ICD-10-CM

## 2019-05-10 ENCOUNTER — Ambulatory Visit
Admission: RE | Admit: 2019-05-10 | Discharge: 2019-05-10 | Disposition: A | Discharge status: Home / Self Care | Payer: Medicare Other | Source: Ambulatory Visit | Attending: Internal Medicine | Admitting: Internal Medicine

## 2019-05-10 ENCOUNTER — Other Ambulatory Visit: Payer: Self-pay

## 2019-05-10 DIAGNOSIS — Z1231 Encounter for screening mammogram for malignant neoplasm of breast: Secondary | ICD-10-CM

## 2019-05-16 DIAGNOSIS — Z6825 Body mass index (BMI) 25.0-25.9, adult: Secondary | ICD-10-CM | POA: Diagnosis not present

## 2019-05-16 DIAGNOSIS — M4120 Other idiopathic scoliosis, site unspecified: Secondary | ICD-10-CM | POA: Diagnosis not present

## 2019-05-16 DIAGNOSIS — I10 Essential (primary) hypertension: Secondary | ICD-10-CM | POA: Diagnosis not present

## 2019-05-29 DIAGNOSIS — H5213 Myopia, bilateral: Secondary | ICD-10-CM | POA: Diagnosis not present

## 2019-06-21 DIAGNOSIS — R5383 Other fatigue: Secondary | ICD-10-CM | POA: Insufficient documentation

## 2019-06-21 DIAGNOSIS — I4891 Unspecified atrial fibrillation: Secondary | ICD-10-CM | POA: Diagnosis not present

## 2019-06-21 DIAGNOSIS — I10 Essential (primary) hypertension: Secondary | ICD-10-CM | POA: Diagnosis not present

## 2019-06-21 DIAGNOSIS — F39 Unspecified mood [affective] disorder: Secondary | ICD-10-CM | POA: Diagnosis not present

## 2019-06-21 DIAGNOSIS — I2510 Atherosclerotic heart disease of native coronary artery without angina pectoris: Secondary | ICD-10-CM | POA: Diagnosis not present

## 2019-07-07 ENCOUNTER — Other Ambulatory Visit: Payer: Self-pay | Admitting: Cardiology

## 2019-07-08 ENCOUNTER — Inpatient Hospital Stay (HOSPITAL_COMMUNITY)
Admission: EM | Admit: 2019-07-08 | Discharge: 2019-07-10 | DRG: 247 | Disposition: A | Discharge status: Home / Self Care | Payer: Medicare Other | Attending: Cardiology | Admitting: Cardiology

## 2019-07-08 ENCOUNTER — Emergency Department (HOSPITAL_COMMUNITY): Payer: Medicare Other

## 2019-07-08 DIAGNOSIS — I2584 Coronary atherosclerosis due to calcified coronary lesion: Secondary | ICD-10-CM | POA: Diagnosis not present

## 2019-07-08 DIAGNOSIS — I25110 Atherosclerotic heart disease of native coronary artery with unstable angina pectoris: Secondary | ICD-10-CM

## 2019-07-08 DIAGNOSIS — M1990 Unspecified osteoarthritis, unspecified site: Secondary | ICD-10-CM | POA: Diagnosis present

## 2019-07-08 DIAGNOSIS — Z8249 Family history of ischemic heart disease and other diseases of the circulatory system: Secondary | ICD-10-CM | POA: Diagnosis not present

## 2019-07-08 DIAGNOSIS — Z90710 Acquired absence of both cervix and uterus: Secondary | ICD-10-CM | POA: Diagnosis not present

## 2019-07-08 DIAGNOSIS — I081 Rheumatic disorders of both mitral and tricuspid valves: Secondary | ICD-10-CM | POA: Diagnosis present

## 2019-07-08 DIAGNOSIS — I214 Non-ST elevation (NSTEMI) myocardial infarction: Secondary | ICD-10-CM | POA: Diagnosis present

## 2019-07-08 DIAGNOSIS — R42 Dizziness and giddiness: Secondary | ICD-10-CM | POA: Diagnosis not present

## 2019-07-08 DIAGNOSIS — E7800 Pure hypercholesterolemia, unspecified: Secondary | ICD-10-CM | POA: Diagnosis not present

## 2019-07-08 DIAGNOSIS — R531 Weakness: Secondary | ICD-10-CM | POA: Diagnosis not present

## 2019-07-08 DIAGNOSIS — R001 Bradycardia, unspecified: Secondary | ICD-10-CM | POA: Diagnosis not present

## 2019-07-08 DIAGNOSIS — I10 Essential (primary) hypertension: Secondary | ICD-10-CM | POA: Diagnosis present

## 2019-07-08 DIAGNOSIS — R002 Palpitations: Secondary | ICD-10-CM | POA: Diagnosis not present

## 2019-07-08 DIAGNOSIS — E782 Mixed hyperlipidemia: Secondary | ICD-10-CM | POA: Diagnosis present

## 2019-07-08 DIAGNOSIS — Z803 Family history of malignant neoplasm of breast: Secondary | ICD-10-CM | POA: Diagnosis not present

## 2019-07-08 DIAGNOSIS — Z20828 Contact with and (suspected) exposure to other viral communicable diseases: Secondary | ICD-10-CM | POA: Diagnosis present

## 2019-07-08 DIAGNOSIS — Z8049 Family history of malignant neoplasm of other genital organs: Secondary | ICD-10-CM

## 2019-07-08 DIAGNOSIS — I480 Paroxysmal atrial fibrillation: Secondary | ICD-10-CM | POA: Diagnosis present

## 2019-07-08 DIAGNOSIS — Z79899 Other long term (current) drug therapy: Secondary | ICD-10-CM

## 2019-07-08 DIAGNOSIS — Z9861 Coronary angioplasty status: Secondary | ICD-10-CM | POA: Diagnosis not present

## 2019-07-08 DIAGNOSIS — R7989 Other specified abnormal findings of blood chemistry: Secondary | ICD-10-CM | POA: Diagnosis not present

## 2019-07-08 DIAGNOSIS — Z7901 Long term (current) use of anticoagulants: Secondary | ICD-10-CM

## 2019-07-08 DIAGNOSIS — Z955 Presence of coronary angioplasty implant and graft: Secondary | ICD-10-CM

## 2019-07-08 DIAGNOSIS — R778 Other specified abnormalities of plasma proteins: Secondary | ICD-10-CM | POA: Diagnosis present

## 2019-07-08 DIAGNOSIS — R000 Tachycardia, unspecified: Secondary | ICD-10-CM | POA: Diagnosis not present

## 2019-07-08 DIAGNOSIS — F419 Anxiety disorder, unspecified: Secondary | ICD-10-CM | POA: Diagnosis not present

## 2019-07-08 LAB — CBC
HCT: 40.2 % (ref 36.0–46.0)
Hemoglobin: 13.2 g/dL (ref 12.0–15.0)
MCH: 31.8 pg (ref 26.0–34.0)
MCHC: 32.8 g/dL (ref 30.0–36.0)
MCV: 96.9 fL (ref 80.0–100.0)
Platelets: 258 10*3/uL (ref 150–400)
RBC: 4.15 MIL/uL (ref 3.87–5.11)
RDW: 13.2 % (ref 11.5–15.5)
WBC: 5.5 10*3/uL (ref 4.0–10.5)
nRBC: 0 % (ref 0.0–0.2)

## 2019-07-08 LAB — URINALYSIS, ROUTINE W REFLEX MICROSCOPIC
Bilirubin Urine: NEGATIVE
Glucose, UA: NEGATIVE mg/dL
Hgb urine dipstick: NEGATIVE
Ketones, ur: NEGATIVE mg/dL
Leukocytes,Ua: NEGATIVE
Nitrite: NEGATIVE
Protein, ur: NEGATIVE mg/dL
Specific Gravity, Urine: 1.008 (ref 1.005–1.030)
pH: 7 (ref 5.0–8.0)

## 2019-07-08 LAB — TROPONIN I (HIGH SENSITIVITY)
Troponin I (High Sensitivity): 11 ng/L (ref <18)
Troponin I (High Sensitivity): 97 ng/L — ABNORMAL HIGH (ref <18)

## 2019-07-08 LAB — BASIC METABOLIC PANEL
Anion gap: 8 (ref 5–15)
BUN: 18 mg/dL (ref 8–23)
CO2: 24 mmol/L (ref 22–32)
Calcium: 8.9 mg/dL (ref 8.9–10.3)
Chloride: 105 mmol/L (ref 98–111)
Creatinine, Ser: 0.85 mg/dL (ref 0.44–1.00)
GFR calc Af Amer: >60 mL/min (ref >60)
GFR calc non Af Amer: >60 mL/min (ref >60)
Glucose, Bld: 137 mg/dL — ABNORMAL HIGH (ref 70–99)
Potassium: 4.3 mmol/L (ref 3.5–5.1)
Sodium: 137 mmol/L (ref 135–145)

## 2019-07-08 MED ORDER — SODIUM CHLORIDE 0.9% FLUSH: 3.0000 mL | Freq: Once | INTRAVENOUS | Status: DC

## 2019-07-08 NOTE — ED Notes (Signed)
ED TO INPATIENT HANDOFF REPORT  ED Nurse Name and Phone #: Lunette Stands Virgil Name/Age/Gender Pura Spice Valente 78 y.o. female Room/Bed: 028C/028C  Code Status   Code Status: Not on file  Home/SNF/Other Home` Patient oriented to: self, place, time and situation Is this baseline? Yes      Chief Complaint Dizziness/ run of SVT  Triage Note Report from EMS: Pt was was experiencing dizziness for about a week. Meclozine taken around 1500 with little to no relief. Felt palpitations; Hx of AFIB RVR. VS stable with EMS except for two occurrences of tachycardia (170 & 150). 18G in [place]. 113 CBG, 160/100 BP with EMS   Allergies No Known Allergies  Level of Care/Admitting Diagnosis ED Disposition    ED Disposition Condition Comment   Admit  The patient appears reasonably stabilized for admission considering the current resources, flow, and capabilities available in the ED at this time, and I doubt any other [facility] requiring further screening and/or treatment in the ED prior to admission is  present.       B Medical/Surgery History Past Medical History:  Diagnosis Date  . Anxiety   . Arthritis   . Atherosclerosis   . Atrial fibrillation ([place])   . Coronary artery disease   . Heart disease   . High cholesterol   . Hypertension    Past Surgical History:  Procedure Laterality Date  . ABDOMINAL HYSTERECTOMY  1983   for endometriosis with Burch  . ANGIOPLASTY    . ANGIOPLASTY    . BACK SURGERY    . CARDIAC SURGERY     Catheterization  . NECK SURGERY       A IV Location/Drains/Wounds Patient Lines/Drains/Airways Status   Active Line/Drains/Airways    Name:   Placement date:   Placement time:   Site:   Days:   Peripheral IV 01/25/19 Left Forearm   01/25/19    2311    Forearm   164          Intake/Output Last 24 hours No intake or output data in the 24 hours ending 07/08/19 2316  Labs/Imaging Results for orders placed or performed during the hospital encounter  of 07/08/19 (from the past 48 hour(s))  Basic metabolic panel     Status: Abnormal   Collection Time: 07/08/19  6:26 PM  Result Value Ref Range   Sodium 137 135 - 145 mmol/L   Potassium 4.3 3.5 - 5.1 mmol/L   Chloride 105 98 - 111 mmol/L   CO2 24 22 - 32 mmol/L   Glucose, Bld 137 (H) 70 - 99 mg/dL   BUN 18 8 - 23 mg/dL   Creatinine, Ser 0.85 0.44 - 1.00 mg/dL   Calcium 8.9 8.9 - 10.3 mg/dL   GFR calc non Af Amer >60 >60 mL/min   GFR calc Af Amer >60 >60 mL/min   Anion gap 8 5 - 15    Comment: Performed at [facility], [place] [address], [place] [address]  CBC     Status: None   Collection Time: 07/08/19  6:26 PM  Result Value Ref Range   WBC 5.5 4.0 - 10.5 K/uL   RBC 4.15 3.87 - 5.11 MIL/uL   Hemoglobin 13.2 12.0 - 15.0 g/dL   HCT 40.2 36.0 - 46.0 %   MCV 96.9 80.0 - 100.0 fL   MCH 31.8 26.0 - 34.0 pg   MCHC 32.8 30.0 - 36.0 g/dL   RDW 13.2 11.5 - 15.5 %  Platelets 258 150 - 400 K/uL   nRBC 0.0 0.0 - 0.2 %    Comment: Performed at [facility], [address], [place], [place] [address]  Troponin I (High Sensitivity)     Status: None   Collection Time: 07/08/19  6:26 PM  Result Value Ref Range   Troponin I (High Sensitivity) 11 <18 ng/L    Comment: (NOTE) Elevated high sensitivity troponin I (hsTnI) values and significant  changes across serial measurements may suggest ACS but many other  chronic and acute conditions are known to elevate hsTnI results.  Refer to the "Links" section for chest pain algorithms and additional  guidance. Performed at [facility], [address] [place], [place] [address]   Urinalysis, Routine w reflex microscopic     Status: Abnormal   Collection Time: 07/08/19  7:50 PM  Result Value Ref Range   Color, Urine STRAW (A) YELLOW   APPearance CLEAR CLEAR   Specific Gravity, Urine 1.008 1.005 - 1.030   pH 7.0 5.0 - 8.0   Glucose, UA NEGATIVE NEGATIVE mg/dL   Hgb urine dipstick NEGATIVE NEGATIVE   Bilirubin Urine  NEGATIVE NEGATIVE   Ketones, ur NEGATIVE NEGATIVE mg/dL   Protein, ur NEGATIVE NEGATIVE mg/dL   Nitrite NEGATIVE NEGATIVE   Leukocytes,Ua NEGATIVE NEGATIVE    Comment: Performed at [facility], [address] [place], [place] [address]  Troponin I (High Sensitivity)     Status: Abnormal   Collection Time: 07/08/19  9:51 PM  Result Value Ref Range   Troponin I (High Sensitivity) 97 (H) <18 ng/L    Comment: RESULT CALLED TO, READ BACK BY AND VERIFIED WITH: Jodi Geralds L,RN 07/08/19 2239 WAYK Performed at [facility], [address] [place], [place] [address]    Dg Chest Port 1 View  Result Date: 07/08/2019 CLINICAL DATA:  Tachycardia and dizziness EXAM: PORTABLE CHEST 1 VIEW COMPARISON:  01/25/2019 FINDINGS: The heart size and mediastinal contours are within normal limits. Both lungs are clear. The visualized skeletal structures are unremarkable. IMPRESSION: No active disease. Electronically Signed   By: Alcide Clever M.D.   On: 07/08/2019 20:20    Pending Labs Unresulted Labs (From admission, onward)    Start     Ordered   07/08/19 2247  SARS CORONAVIRUS 2 Nasal Swab Aptima Multi Swab  (Asymptomatic Patients Labs)  Once,   STAT    Question Answer Comment  Is this test for diagnosis or screening Screening   Symptomatic for COVID-19 as defined by CDC No   Hospitalized for COVID-19 No   Admitted to ICU for COVID-19 No   Previously tested for COVID-19 No   Resident in a congregate (group) care setting No   Employed in healthcare setting No   Pregnant No      07/08/19 2247          Vitals/Pain Today's Vitals   07/08/19 1801 07/08/19 1815 07/08/19 1900 07/08/19 2015  BP:  136/69 (!) 142/70 (!) 147/77  Pulse:   (!) 49 (!) 51  Resp:  19 18 15   Temp:      TempSrc:      SpO2:   96% 98%  Weight: 61.2 kg     Height: 5\' 2"  (1.575 m)     PainSc: 0-No pain       Isolation Precautions No active isolations  Medications Medications  sodium chloride flush (NS) 0.9 %  injection 3 mL (0 mLs Intravenous Hold 07/08/19  [id number])    Mobility walks Low fall risk   Focused Assessments Cardiac Assessment Handoff:    Lab Results  Component Value Date   CKTOTAL 82 09/11/2011   CKMB 1.7 09/11/2011   No results found for: DDIMER Does the Patient currently have chest pain? No     R Recommendations: See Admitting Provider Note  Report given to:   Additional Notes: Note elevated Troponin level of 97 from previous level of 11.

## 2019-07-08 NOTE — ED Provider Notes (Addendum)
[facility] EMERGENCY DEPARTMENT Provider Note   CSN: [id number] Arrival date & time: 07/08/19  1750     History   Chief Complaint Chief Complaint  Patient presents with  . Near Syncope    HPI Erica Frank is a 78 y.o. female.     Patient followed by Dr. Einar Gip from cardiology.  And Dr. Elsworth Soho as her primary care doctor.  Patient brought in by EMS.  Patient was experiencing some lightheadedness may be some brief intermittent dizziness but no real room spinning.  For about a week but she has had fatigue has not felt well today seems seem to be worse after church.  Patient did take a anti-vert at around 3 PM but really no relief.  Felt some palpitations has a history of atrial fib RVR but her heart rate was fast but on cardiac monitoring with [facility] they did notice 2 occurrences of some tachycardia they reported 1 7150 but cardiac monitoring here has showed none.  Blood sugar was 113 blood pressure was 160/100 with EMS.  Upon arrival here blood pressure was 151/77 heart rate was 53 she was afebrile.  Oxygen saturations were 96% on room air denied any chest pain or shortness of breath.  Patient has a known history of coronary artery disease.  Known history paroxysmal atrial fibrillation.  Patient has had 2 angioplasties.  Also history of hypertension.  Patient states that in the past when she has had cardiac problems and she has not had a abnormal EKGs and did not really have chest pain.     Past Medical History:  Diagnosis Date  . Anxiety   . Arthritis   . Atherosclerosis   . Atrial fibrillation ([place])   . Coronary artery disease   . Heart disease   . High cholesterol   . Hypertension     Patient Active Problem List   Diagnosis Date Noted  . Paroxysmal atrial fibrillation ([place]) 02/15/2019  . Mixed hyperlipidemia 02/15/2019  . Bradycardia 02/15/2019  . Atherosclerosis of native coronary artery of native heart 12/20/2018  . Dyslipidemia 12/20/2018  . Essential  hypertension 12/20/2018    Past Surgical History:  Procedure Laterality Date  . ABDOMINAL HYSTERECTOMY  1983   for endometriosis with [facility]  . ANGIOPLASTY    . ANGIOPLASTY    . BACK SURGERY    . CARDIAC SURGERY     Catheterization  . NECK SURGERY       OB History    Gravida  1   Para  1   Term      Preterm      AB      Living  1     SAB      TAB      Ectopic      Multiple      Live Births               Home Medications    Prior to Admission medications   Medication Sig Start Date End Date Taking? Authorizing Provider  acetaminophen (TYLENOL) 500 MG tablet Take 500 mg by mouth every 6 (six) hours as needed.    [provider]  Calcium Carbonate-Vit D-Min (CALCIUM 1200 PO) Take 1 tablet by mouth daily as needed.     [provider]  co-enzyme Q-10 30 MG capsule Take 30 mg by mouth daily as needed.     [provider]  Cyanocobalamin (VITAMIN B 12 PO) Take 1 tablet by mouth  daily as needed.     [provider]  ELIQUIS 5 MG TABS tablet TAKE 1 TABLET(5 MG) BY MOUTH TWICE DAILY 03/15/19   Yates DecampGanji, Jay, MD  ezetimibe-simvastatin (VYTORIN) 10-40 MG tablet TAKE 1 TABLET BY MOUTH DAILY 02/09/19   Georgeanna LeaKrasowski, Robert J, MD  lisinopril (PRINIVIL,ZESTRIL) 10 MG tablet Take 1 tablet (10 mg total) by mouth daily. 12/20/18 04/26/19  Georgeanna LeaKrasowski, Robert J, MD  Multiple Vitamins-Minerals (MULTIVITAMIN ADULT PO) Take 1 tablet by mouth daily as needed.     [provider]  nitroGLYCERIN (NITROSTAT) 0.4 MG SL tablet Place 1 tablet (0.4 mg total) under the tongue every 5 (five) minutes as needed for chest pain. 12/20/18   Georgeanna LeaKrasowski, Robert J, MD  tretinoin (RETIN-A) 0.1 % cream Apply topically daily as needed.    [provider]  TURMERIC PO Take 1 tablet by mouth daily.    [provider]    Family History Family History  Problem Relation Age of Onset  . Uterine cancer Mother   . Hypertension Brother   . Heart disease  Brother   . Breast cancer Cousin     Social History Social History   Tobacco Use  . Smoking status: Never Smoker  . Smokeless tobacco: Never Used  Substance Use Topics  . Alcohol use: Yes    Comment: Occasional  . Drug use: No     Allergies   Patient has no known allergies.   Review of Systems Review of Systems  Constitutional: Positive for fatigue. Negative for chills and fever.  HENT: Negative for congestion, rhinorrhea and sore throat.   Eyes: Negative for visual disturbance.  Respiratory: Negative for cough and shortness of breath.   Cardiovascular: Positive for palpitations. Negative for chest pain and leg swelling.  Gastrointestinal: Negative for abdominal pain, diarrhea, nausea and vomiting.  Genitourinary: Negative for dysuria.  Musculoskeletal: Negative for back pain and neck pain.  Skin: Negative for rash.  Neurological: Positive for dizziness, weakness and light-headedness. Negative for headaches.  Hematological: Does not bruise/bleed easily.  Psychiatric/Behavioral: Negative for confusion.     Physical Exam Updated Vital Signs BP (!) 147/77   Pulse (!) 51   Temp 98.5 F (36.9 C) (Oral)   Resp 15   Ht 1.575 m (5\' 2" )   Wt 61.2 kg   SpO2 98%   BMI 24.69 kg/m   Physical Exam Vitals signs and nursing note reviewed.  Constitutional:      General: She is not in acute distress.    Appearance: She is well-developed.  HENT:     Head: Normocephalic and atraumatic.  Eyes:     Extraocular Movements: Extraocular movements intact.     Conjunctiva/sclera: Conjunctivae normal.     Pupils: Pupils are equal, round, and reactive to light.  Neck:     Musculoskeletal: Normal range of motion and neck supple.  Cardiovascular:     Rate and Rhythm: Normal rate and regular rhythm.     Heart sounds: No murmur.  Pulmonary:     Effort: Pulmonary effort is normal. No respiratory distress.     Breath sounds: Normal breath sounds.  Abdominal:     Palpations: Abdomen  is soft.     Tenderness: There is no abdominal tenderness.  Musculoskeletal:        General: No swelling.  Skin:    General: Skin is warm and dry.  Neurological:     Mental Status: She is alert.      ED Treatments / Results  Labs (all labs ordered are listed, but only abnormal results are displayed) Labs Reviewed  BASIC METABOLIC PANEL - Abnormal; Notable for the following components:      Result Value   Glucose, Bld 137 (*)    All other components within normal limits  URINALYSIS, ROUTINE W REFLEX MICROSCOPIC - Abnormal; Notable for the following components:   Color, Urine STRAW (*)    All other components within normal limits  TROPONIN I (HIGH SENSITIVITY) - Abnormal; Notable for the following components:   Troponin I (High Sensitivity) 97 (*)    All other components within normal limits  CBC  CBG MONITORING, ED  TROPONIN I (HIGH SENSITIVITY)    EKG EKG Interpretation  Date/Time:  Sunday July 08 2019 17:56:23 EDT Ventricular Rate:  60 PR Interval:    QRS Duration: 81 QT Interval:  451 QTC Calculation: 451 R Axis:   -5 Text Interpretation:  Sinus rhythm Borderline T abnormalities, anterior leads Confirmed by Vanetta MuldersZackowski, Gaylene Moylan [phone]([id number]) on 07/08/2019 7:37:38 PM   Radiology Dg Chest Port 1 View  Result Date: 07/08/2019 CLINICAL DATA:  Tachycardia and dizziness EXAM: PORTABLE CHEST 1 VIEW COMPARISON:  01/25/2019 FINDINGS: The heart size and mediastinal contours are within normal limits. Both lungs are clear. The visualized skeletal structures are unremarkable. IMPRESSION: No active disease. Electronically Signed   By: Alcide CleverMark  Lukens M.D.   On: 07/08/2019 20:20    Procedures Procedures (including critical care time)  CRITICAL CARE Performed by: Vanetta MuldersScott Ingeborg Fite Total critical care time: 30 minutes Critical care time was exclusive of separately billable procedures and treating other patients. Critical care was necessary to treat or prevent imminent or life-threatening  deterioration. Critical care was time spent personally by me on the following activities: development of treatment plan with patient and/or surrogate as well as nursing, discussions with consultants, evaluation of patient's response to treatment, examination of patient, obtaining history from patient or surrogate, ordering and performing treatments and interventions, ordering and review of laboratory studies, ordering and review of radiographic studies, pulse oximetry and re-evaluation of patient's condition.   Medications Ordered in ED Medications  sodium chloride flush (NS) 0.9 % injection 3 mL (0 mLs Intravenous Hold 07/08/19 1828)     Initial Impression / Assessment and Plan / ED Course  I have reviewed the triage vital signs and the nursing notes.  Pertinent labs & imaging results that were available during my care of the patient were reviewed by me and considered in my medical decision making (see chart for details).       Patient without any real chest pain.  Troponin was done because of her concerns that in the past she has had minimal or no symptoms and has had angioplasties x2.  And she felt a little bit that way today.  Initial troponin was 11 but repeat was in the 90s.  Significant delta change.  Contacted Dr. Jacinto HalimGanji who wants her admitted to telemetry wanted heparin initiated but she is on Eliquis.  She did not take her dose in the morning like she normally does she did take it at about 3 PM this afternoon.  I contacted pharmacy.  They will go ahead and and monitor and dose the heparin.  They state they usually wait.  Have ordered a PT and PTT is baseline for them.  Admitting orders placed.  Patient's work-up otherwise without any significant abnormalities.  COVID-19 testing is pending.  In addition I have ordered additional troponins tube to be done.  Dr. Jacinto HalimGanji did  want to start heparin.  But since she is on the Eliquis pharmacy is recommending delay and they will they will initiate the  heparin when appropriate.   Final Clinical Impressions(s) / ED Diagnoses   Final diagnoses:  Elevated troponin    ED Discharge Orders    None       Vanetta Mulders, MD 07/09/19 0040    Vanetta Mulders, MD 07/09/19 [phone]

## 2019-07-08 NOTE — ED Triage Notes (Signed)
Report from EMS: Pt was was experiencing dizziness for about a week. Meclozine taken around 1500 with little to no relief. Felt palpitations; Hx of AFIB RVR. VS stable with EMS except for two occurrences of tachycardia (170 & 150). 18G in [place]. 113 CBG, 160/100 BP with EMS

## 2019-07-08 NOTE — ED Notes (Signed)
Date and time results received: 07/08/19 2300 (use smartphrase ".now" to insert current time)  Test: Troponin Critical Value: 36  Name of Provider Notified: Dr. Rogene Houston  Orders Received? Or Actions Taken?: Actions Taken: Notified Dr. Rogene Houston

## 2019-07-09 ENCOUNTER — Encounter (HOSPITAL_COMMUNITY)
Admission: EM | Disposition: A | Discharge status: Home / Self Care | Payer: Self-pay | Source: Home / Self Care | Attending: Cardiology

## 2019-07-09 ENCOUNTER — Other Ambulatory Visit: Payer: Self-pay

## 2019-07-09 ENCOUNTER — Encounter (HOSPITAL_COMMUNITY): Payer: Self-pay

## 2019-07-09 DIAGNOSIS — Z8249 Family history of ischemic heart disease and other diseases of the circulatory system: Secondary | ICD-10-CM | POA: Diagnosis not present

## 2019-07-09 DIAGNOSIS — I2584 Coronary atherosclerosis due to calcified coronary lesion: Secondary | ICD-10-CM | POA: Diagnosis present

## 2019-07-09 DIAGNOSIS — Z20828 Contact with and (suspected) exposure to other viral communicable diseases: Secondary | ICD-10-CM | POA: Diagnosis present

## 2019-07-09 DIAGNOSIS — I25110 Atherosclerotic heart disease of native coronary artery with unstable angina pectoris: Secondary | ICD-10-CM

## 2019-07-09 DIAGNOSIS — Z79899 Other long term (current) drug therapy: Secondary | ICD-10-CM | POA: Diagnosis not present

## 2019-07-09 DIAGNOSIS — E782 Mixed hyperlipidemia: Secondary | ICD-10-CM | POA: Diagnosis present

## 2019-07-09 DIAGNOSIS — I10 Essential (primary) hypertension: Secondary | ICD-10-CM

## 2019-07-09 DIAGNOSIS — I214 Non-ST elevation (NSTEMI) myocardial infarction: Secondary | ICD-10-CM | POA: Diagnosis present

## 2019-07-09 DIAGNOSIS — M1990 Unspecified osteoarthritis, unspecified site: Secondary | ICD-10-CM | POA: Diagnosis present

## 2019-07-09 DIAGNOSIS — F419 Anxiety disorder, unspecified: Secondary | ICD-10-CM | POA: Diagnosis present

## 2019-07-09 DIAGNOSIS — E7800 Pure hypercholesterolemia, unspecified: Secondary | ICD-10-CM | POA: Diagnosis present

## 2019-07-09 DIAGNOSIS — Z9861 Coronary angioplasty status: Secondary | ICD-10-CM | POA: Diagnosis not present

## 2019-07-09 DIAGNOSIS — R7989 Other specified abnormal findings of blood chemistry: Secondary | ICD-10-CM | POA: Diagnosis present

## 2019-07-09 DIAGNOSIS — Z7901 Long term (current) use of anticoagulants: Secondary | ICD-10-CM | POA: Diagnosis not present

## 2019-07-09 DIAGNOSIS — R002 Palpitations: Secondary | ICD-10-CM

## 2019-07-09 DIAGNOSIS — Z803 Family history of malignant neoplasm of breast: Secondary | ICD-10-CM | POA: Diagnosis not present

## 2019-07-09 DIAGNOSIS — R778 Other specified abnormalities of plasma proteins: Secondary | ICD-10-CM | POA: Diagnosis present

## 2019-07-09 DIAGNOSIS — R001 Bradycardia, unspecified: Secondary | ICD-10-CM | POA: Diagnosis present

## 2019-07-09 DIAGNOSIS — Z8049 Family history of malignant neoplasm of other genital organs: Secondary | ICD-10-CM | POA: Diagnosis not present

## 2019-07-09 DIAGNOSIS — I081 Rheumatic disorders of both mitral and tricuspid valves: Secondary | ICD-10-CM | POA: Diagnosis present

## 2019-07-09 DIAGNOSIS — Z90710 Acquired absence of both cervix and uterus: Secondary | ICD-10-CM | POA: Diagnosis not present

## 2019-07-09 DIAGNOSIS — I480 Paroxysmal atrial fibrillation: Secondary | ICD-10-CM | POA: Diagnosis present

## 2019-07-09 HISTORY — PX: LEFT HEART CATH AND CORONARY ANGIOGRAPHY: CATH118249

## 2019-07-09 HISTORY — PX: CORONARY STENT INTERVENTION: CATH118234

## 2019-07-09 HISTORY — DX: Non-ST elevation (NSTEMI) myocardial infarction: I21.4

## 2019-07-09 LAB — APTT
aPTT: 32 seconds (ref 24–36)
aPTT: 70 seconds — ABNORMAL HIGH (ref 24–36)

## 2019-07-09 LAB — TROPONIN I (HIGH SENSITIVITY)
Troponin I (High Sensitivity): 109 ng/L (ref <18)
Troponin I (High Sensitivity): 116 ng/L (ref <18)

## 2019-07-09 LAB — PROTIME-INR
INR: 1.2 (ref 0.8–1.2)
Prothrombin Time: 14.8 seconds (ref 11.4–15.2)

## 2019-07-09 LAB — POCT ACTIVATED CLOTTING TIME
Activated Clotting Time: 329 seconds
Activated Clotting Time: 246 seconds

## 2019-07-09 LAB — HEPARIN LEVEL (UNFRACTIONATED): Heparin Unfractionated: 0.92 IU/mL — ABNORMAL HIGH (ref 0.30–0.70)

## 2019-07-09 LAB — SARS CORONAVIRUS 2 (TAT 6-24 HRS): SARS Coronavirus 2: NEGATIVE

## 2019-07-09 SURGERY — LEFT HEART CATH AND CORONARY ANGIOGRAPHY
Anesthesia: LOCAL

## 2019-07-09 MED ORDER — ASPIRIN 81 MG PO CHEW
81.0000 mg | CHEWABLE_TABLET | Freq: Every day | ORAL | Status: DC
  Administered 2019-07-09 – 2019-07-10 (×2): 81 mg via ORAL
  Filled 2019-07-09 (×2): qty 1

## 2019-07-09 MED ORDER — SODIUM CHLORIDE 0.9 % IV SOLN
INTRAVENOUS | Status: AC | PRN
  Administered 2019-07-09: 250 mL via INTRAVENOUS

## 2019-07-09 MED ORDER — FENTANYL CITRATE (PF) 100 MCG/2ML IJ SOLN
INTRAMUSCULAR | Status: DC | PRN
  Administered 2019-07-09: 25 ug via INTRAVENOUS
  Administered 2019-07-09: 50 ug via INTRAVENOUS

## 2019-07-09 MED ORDER — SODIUM CHLORIDE 0.9% FLUSH: 3.0000 mL | Freq: Two times a day (BID) | INTRAVENOUS | Status: DC

## 2019-07-09 MED ORDER — MIDAZOLAM HCL 2 MG/2ML IJ SOLN
INTRAMUSCULAR | Status: AC
  Filled 2019-07-09: qty 2

## 2019-07-09 MED ORDER — NITROGLYCERIN 1 MG/10 ML FOR IR/CATH LAB
INTRA_ARTERIAL | Status: DC | PRN
  Administered 2019-07-09 (×2): 200 ug via INTRACORONARY

## 2019-07-09 MED ORDER — HEPARIN (PORCINE) IN NACL 1000-0.9 UT/500ML-% IV SOLN
INTRAVENOUS | Status: DC | PRN
  Administered 2019-07-09 (×2): 500 mL

## 2019-07-09 MED ORDER — SODIUM CHLORIDE 0.9 % IV SOLN: 250.0000 mL | INTRAVENOUS | Status: DC | PRN

## 2019-07-09 MED ORDER — SODIUM CHLORIDE 0.9% FLUSH
3.0000 mL | Freq: Two times a day (BID) | INTRAVENOUS | Status: DC
  Administered 2019-07-10: 3 mL via INTRAVENOUS

## 2019-07-09 MED ORDER — TICAGRELOR 90 MG PO TABS: 90.0000 mg | ORAL_TABLET | Freq: Two times a day (BID) | ORAL | Status: DC

## 2019-07-09 MED ORDER — EZETIMIBE-SIMVASTATIN 10-40 MG PO TABS
1.0000 | ORAL_TABLET | Freq: Every day | ORAL | Status: DC
  Filled 2019-07-09 (×2): qty 1

## 2019-07-09 MED ORDER — ZOLPIDEM TARTRATE 5 MG PO TABS
5.0000 mg | ORAL_TABLET | Freq: Every evening | ORAL | Status: DC | PRN
  Filled 2019-07-09: qty 1

## 2019-07-09 MED ORDER — HEPARIN (PORCINE) 25000 UT/250ML-% IV SOLN
850.0000 [IU]/h | INTRAVENOUS | Status: DC
  Administered 2019-07-09: 850 [IU]/h via INTRAVENOUS

## 2019-07-09 MED ORDER — LIDOCAINE HCL (PF) 1 % IJ SOLN
INTRAMUSCULAR | Status: DC | PRN
  Administered 2019-07-09: 5 mL

## 2019-07-09 MED ORDER — HEART ATTACK BOUNCING BOOK
Freq: Once | Status: AC
  Administered 2019-07-10: 1
  Filled 2019-07-09: qty 1

## 2019-07-09 MED ORDER — HYDRALAZINE HCL 20 MG/ML IJ SOLN: 5.0000 mg | INTRAMUSCULAR | Status: AC | PRN

## 2019-07-09 MED ORDER — VERAPAMIL HCL 2.5 MG/ML IV SOLN
INTRAVENOUS | Status: DC | PRN
  Administered 2019-07-09: 10 mL via INTRA_ARTERIAL

## 2019-07-09 MED ORDER — MIDAZOLAM HCL 2 MG/2ML IJ SOLN
INTRAMUSCULAR | Status: DC | PRN
  Administered 2019-07-09: 1 mg via INTRAVENOUS

## 2019-07-09 MED ORDER — FENTANYL CITRATE (PF) 100 MCG/2ML IJ SOLN
INTRAMUSCULAR | Status: AC
  Filled 2019-07-09: qty 2

## 2019-07-09 MED ORDER — SODIUM CHLORIDE 0.9 % WEIGHT BASED INFUSION
1.0000 mL/kg/h | INTRAVENOUS | Status: AC
  Administered 2019-07-09: 18:00:00 1 mL/kg/h via INTRAVENOUS

## 2019-07-09 MED ORDER — METOPROLOL TARTRATE 25 MG PO TABS
25.0000 mg | ORAL_TABLET | Freq: Two times a day (BID) | ORAL | Status: DC
  Administered 2019-07-10: 25 mg via ORAL
  Filled 2019-07-09 (×2): qty 1

## 2019-07-09 MED ORDER — LISINOPRIL 10 MG PO TABS
10.0000 mg | ORAL_TABLET | Freq: Every day | ORAL | Status: DC
  Administered 2019-07-10: 09:00:00 10 mg via ORAL
  Filled 2019-07-09 (×2): qty 1

## 2019-07-09 MED ORDER — SODIUM CHLORIDE 0.9% FLUSH: 3.0000 mL | INTRAVENOUS | Status: DC | PRN

## 2019-07-09 MED ORDER — EZETIMIBE-SIMVASTATIN 10-40 MG PO TABS
1.0000 | ORAL_TABLET | Freq: Once | ORAL | Status: AC
  Administered 2019-07-09: 03:00:00 1 via ORAL
  Filled 2019-07-09: qty 1

## 2019-07-09 MED ORDER — HEPARIN SODIUM (PORCINE) 1000 UNIT/ML IJ SOLN
INTRAMUSCULAR | Status: DC | PRN
  Administered 2019-07-09: 2000 [IU] via INTRAVENOUS
  Administered 2019-07-09: 6000 [IU] via INTRAVENOUS

## 2019-07-09 MED ORDER — VERAPAMIL HCL 2.5 MG/ML IV SOLN
INTRAVENOUS | Status: AC
  Filled 2019-07-09: qty 2

## 2019-07-09 MED ORDER — HEPARIN (PORCINE) IN NACL 1000-0.9 UT/500ML-% IV SOLN
INTRAVENOUS | Status: AC
  Filled 2019-07-09: qty 1000

## 2019-07-09 MED ORDER — NITROGLYCERIN 1 MG/10 ML FOR IR/CATH LAB
INTRA_ARTERIAL | Status: AC
  Filled 2019-07-09: qty 10

## 2019-07-09 MED ORDER — ONDANSETRON HCL 4 MG/2ML IJ SOLN: 4.0000 mg | Freq: Four times a day (QID) | INTRAMUSCULAR | Status: DC | PRN

## 2019-07-09 MED ORDER — SODIUM CHLORIDE 0.9 % WEIGHT BASED INFUSION: 3.0000 mL/kg/h | INTRAVENOUS | Status: DC

## 2019-07-09 MED ORDER — SODIUM CHLORIDE 0.9 % WEIGHT BASED INFUSION: 1.0000 mL/kg/h | INTRAVENOUS | Status: DC

## 2019-07-09 MED ORDER — LISINOPRIL 10 MG PO TABS
10.0000 mg | ORAL_TABLET | Freq: Once | ORAL | Status: AC
  Administered 2019-07-09: 03:00:00 10 mg via ORAL
  Filled 2019-07-09: qty 1

## 2019-07-09 MED ORDER — LIDOCAINE HCL (PF) 1 % IJ SOLN
INTRAMUSCULAR | Status: AC
  Filled 2019-07-09: qty 30

## 2019-07-09 MED ORDER — APIXABAN 5 MG PO TABS
5.0000 mg | ORAL_TABLET | Freq: Two times a day (BID) | ORAL | Status: DC
  Administered 2019-07-10: 09:00:00 5 mg via ORAL
  Filled 2019-07-09: qty 1

## 2019-07-09 MED ORDER — IOHEXOL 350 MG/ML SOLN
INTRAVENOUS | Status: DC | PRN
  Administered 2019-07-09: 120 mL via INTRAVENOUS

## 2019-07-09 MED ORDER — ONDANSETRON HCL 4 MG/2ML IJ SOLN
INTRAMUSCULAR | Status: AC
  Filled 2019-07-09: qty 2

## 2019-07-09 MED ORDER — ACETAMINOPHEN 325 MG PO TABS: 650.0000 mg | ORAL_TABLET | ORAL | Status: DC | PRN

## 2019-07-09 MED ORDER — ASPIRIN EC 81 MG PO TBEC: 81.0000 mg | DELAYED_RELEASE_TABLET | Freq: Every day | ORAL | Status: DC

## 2019-07-09 MED ORDER — CLOPIDOGREL BISULFATE 75 MG PO TABS
300.0000 mg | ORAL_TABLET | Freq: Once | ORAL | Status: AC
  Administered 2019-07-10: 300 mg via ORAL
  Filled 2019-07-09: qty 4

## 2019-07-09 MED ORDER — TICAGRELOR 90 MG PO TABS
ORAL_TABLET | ORAL | Status: DC | PRN
  Administered 2019-07-09: 180 mg via ORAL

## 2019-07-09 MED ORDER — ACETAMINOPHEN 500 MG PO TABS
500.0000 mg | ORAL_TABLET | Freq: Four times a day (QID) | ORAL | Status: DC | PRN
  Administered 2019-07-09: 500 mg via ORAL
  Filled 2019-07-09: qty 1

## 2019-07-09 MED ORDER — VERAPAMIL HCL 2.5 MG/ML IV SOLN
INTRA_ARTERIAL | Status: DC | PRN
  Administered 2019-07-09: 7.5 mL via INTRA_ARTERIAL

## 2019-07-09 MED ORDER — ONDANSETRON HCL 4 MG/2ML IJ SOLN
INTRAMUSCULAR | Status: DC | PRN
  Administered 2019-07-09: 4 mg via INTRAVENOUS

## 2019-07-09 MED ORDER — ANGIOPLASTY BOOK
Freq: Once | Status: AC
  Administered 2019-07-10: 1
  Filled 2019-07-09: qty 1

## 2019-07-09 MED ORDER — NITROGLYCERIN 0.4 MG SL SUBL
0.4000 mg | SUBLINGUAL_TABLET | SUBLINGUAL | Status: DC | PRN
  Administered 2019-07-09 (×3): 0.4 mg via SUBLINGUAL
  Filled 2019-07-09: qty 1

## 2019-07-09 MED ORDER — HEPARIN SODIUM (PORCINE) 1000 UNIT/ML IJ SOLN
INTRAMUSCULAR | Status: AC
  Filled 2019-07-09: qty 1

## 2019-07-09 MED ORDER — ASPIRIN 81 MG PO CHEW
324.0000 mg | CHEWABLE_TABLET | Freq: Once | ORAL | Status: AC
  Administered 2019-07-09: 02:00:00 324 mg via ORAL
  Filled 2019-07-09: qty 4

## 2019-07-09 MED ORDER — TICAGRELOR 90 MG PO TABS
ORAL_TABLET | ORAL | Status: AC
  Filled 2019-07-09: qty 2

## 2019-07-09 SURGICAL SUPPLY — 17 items
BAG SNAP BAND KOVER 36X36 (MISCELLANEOUS) ×1 IMPLANT
BALLN WOLVERINE 3.00X10 (BALLOONS) ×2
BALLOON WOLVERINE 3.00X10 (BALLOONS) IMPLANT
CATH OPTITORQUE TIG 4.0 5F (CATHETERS) ×1 IMPLANT
CATH VISTA GUIDE 6FR XB3 (CATHETERS) ×1 IMPLANT
DEVICE RAD COMP TR BAND LRG (VASCULAR PRODUCTS) ×1 IMPLANT
GLIDESHEATH SLEND A-KIT 6F 22G (SHEATH) ×1 IMPLANT
GUIDEWIRE ANGLED .035X150CM (WIRE) ×1 IMPLANT
GUIDEWIRE INQWIRE 1.5J.035X260 (WIRE) IMPLANT
INQWIRE 1.5J .035X260CM (WIRE) ×2
KIT ENCORE 26 ADVANTAGE (KITS) ×1 IMPLANT
KIT HEART LEFT (KITS) ×2 IMPLANT
PACK CARDIAC CATHETERIZATION (CUSTOM PROCEDURE TRAY) ×2 IMPLANT
STENT SYNERGY DES 3X24 (Permanent Stent) ×1 IMPLANT
TRANSDUCER W/STOPCOCK (MISCELLANEOUS) ×2 IMPLANT
TUBING CIL FLEX 10 FLL-RA (TUBING) ×2 IMPLANT
WIRE COUGAR XT STRL 190CM (WIRE) ×1 IMPLANT

## 2019-07-09 NOTE — Progress Notes (Signed)
Patient complains of sharp left arm pain radiating downward. EKG complete.  Paged Dr. Einar Gip.  Instructed to give SL nitro.  SL nitro x3 given.  Patient asleep after 3rd nitro.

## 2019-07-09 NOTE — Progress Notes (Signed)
   07/09/19 1057  Clinical Encounter Type  Visited With Patient  Visit Type Initial;Other (Comment) (AD)  Referral From Physician  Consult/Referral To Chaplain  This chaplain responded to spiritual care consult for Pt. AD. The chaplain understands from conversation with the Pt., the Pt. has a Living Will at home.  The chaplain briefly summarized the AD educational document.  The chaplain understands the Pt. daughter will be healthcare decision maker by [employer].  The chaplain heard the Pt. desire not to complete an AD at this time.  The chaplain left the document in the Pt. Room for review.  The Pt will call spiritual care if needed for AD F/U.  The chaplain offered personal prayer for the Pt. surgery today.  This chaplain is available for F/U spiritual care as needed.

## 2019-07-09 NOTE — Progress Notes (Signed)
ANTICOAGULATION CONSULT NOTE - Follow-Up Consult  Pharmacy Consult for Heparin Indication: atrial fibrillation  No Known Allergies  Patient Measurements: Height: 5' 2.5" (158.8 cm) Weight: 136 lb 11.2 oz (62 kg) IBW/kg (Calculated) : 51.25  Vital Signs: Temp: 97.9 F (36.6 C) (08/03 1225) Temp Source: Oral (08/03 1225) BP: 122/70 (08/03 1225) Pulse Rate: 48 (08/03 1225)  Labs: Recent Labs    07/08/19 1826 07/08/19 2151 07/08/19 2352 07/09/19 0235 07/09/19 1204  HGB 13.2  --   --   --   --   HCT 40.2  --   --   --   --   PLT 258  --   --   --   --   APTT  --   --   --  32 70*  LABPROT  --   --   --  14.8  --   INR  --   --   --  1.2  --   HEPARINUNFRC  --   --   --  0.92*  --   CREATININE 0.85  --   --   --   --   TROPONINIHS 11 97* 116* 109*  --     Estimated Creatinine Clearance: 48.7 mL/min (by C-G formula based on SCr of 0.85 mg/dL).   Medical History: Past Medical History:  Diagnosis Date  . Anxiety   . Arthritis   . Atherosclerosis   . Atrial fibrillation ([facility])   . Coronary artery disease   . Heart disease   . High cholesterol   . Hypertension     Medications:  No current facility-administered medications on file prior to encounter.    Current Outpatient Medications on File Prior to Encounter  Medication Sig Dispense Refill  . acetaminophen (TYLENOL) 500 MG tablet Take 500 mg by mouth every 6 (six) hours as needed for mild pain.     Marland Kitchen BIOTIN PO Take 1 tablet by mouth at bedtime.    . Calcium Carbonate-Vit D-Min (CALCIUM 1200 PO) Take 1 tablet by mouth daily as needed (when thinks about it).     Marland Kitchen co-enzyme Q-10 30 MG capsule Take 30 mg by mouth once a week.     . Cyanocobalamin (VITAMIN B 12 PO) Take 1 tablet by mouth once a week.     Marland Kitchen ELIQUIS 5 MG TABS tablet TAKE 1 TABLET(5 MG) BY MOUTH TWICE DAILY (Patient taking differently: Take 5 mg by mouth 2 (two) times daily. ) 180 tablet 1  . ezetimibe-simvastatin (VYTORIN) 10-40 MG tablet TAKE 1 TABLET  BY MOUTH DAILY (Patient taking differently: Take 1 tablet by mouth every evening. ) 90 tablet 1  . lisinopril (PRINIVIL,ZESTRIL) 10 MG tablet Take 1 tablet (10 mg total) by mouth daily. 90 tablet 1  . Multiple Vitamins-Minerals (MULTIVITAMIN ADULT PO) Take 1 tablet by mouth once a week.     . nitroGLYCERIN (NITROSTAT) 0.4 MG SL tablet Place 1 tablet (0.4 mg total) under the tongue every 5 (five) minutes as needed for chest pain. 25 tablet 11  . TURMERIC PO Take 1 tablet by mouth once a week.     . zolpidem (AMBIEN) 5 MG tablet Take 5 mg by mouth at bedtime.       Assessment: 78 y.o. female admitted with NSTEMI, h/o Afib and Eliquis PTA.  Eliquis last taken at 3 pm 8/2.  Heparin therapeutic on 850 units/hr.  Currently using aPTT to adjust dosing given recent Eliquis use.  Noted plans for cath today.  Goal of Therapy:  aPTT 66-102 Heparin level 0.3-0.7 units/ml Monitor platelets by anticoagulation protocol: Yes   Plan:  Continue heparin 850 units/hr Recheck aPTT in 6 hours for confirmation Follow-up after cath.  [employer], Pharm.D., BCPS Clinical Pharmacist Pager: [phone] Clinical phone for 07/09/2019 from 8:30-4:00 is [phone].  **Pharmacist phone directory can now be found on [email or web address] ([email or web address]).  Listed under [place].  07/09/2019 1:14 PM

## 2019-07-09 NOTE — H&P (Signed)
CARDIOLOGY ADMIT NOTE   Erica Frank is an 78 y.o. female.    Chief Complaint  Patient presents with  . Near Syncope      HPI: Erica Frank  is a 78 y.o. female  with coronary artery disease and remote angioplasty to the LAD in 1989 and 1990, paroxysmal atrial fibrillation S/P cardioversion on 01/25/2019 and on Eliquis, hypertension, was last seen by me on 04/26/2019, lately she been complaining of worsening fatigue.  He felt that her symptoms are similar to anginal equivalent pain and hence presented to the ED, where EKG revealed lost weight changes, however high since to serum troponin was severely positive for non-STEMI hence needing admission.  Denies chest pain or shortness of breath.  No bleeding diathesis on Eliquis. On further questioning, patient states that for the past 1 week she has had heartburn on and off.  This morning she also had an episode of left arm pain without chest pain that got relieved with 3 sublingual nitroglycerin.   Past Medical History:  Diagnosis Date  . Anxiety   . Arthritis   . Atherosclerosis   . Atrial fibrillation ([place])   . Coronary artery disease   . Heart disease   . High cholesterol   . Hypertension     Past Surgical History:  Procedure Laterality Date  . ABDOMINAL HYSTERECTOMY  1983   for endometriosis with [place]  . ANGIOPLASTY    . ANGIOPLASTY    . BACK SURGERY    . CARDIAC SURGERY     Catheterization  . NECK SURGERY      Social History   Socioeconomic History  . Marital status: Widowed    Spouse name: Not on file  . Number of children: 1  . Years of education: Not on file  . Highest education level: Not on file  Occupational History  . Not on file  Social Needs  . Financial resource strain: Not on file  . Food insecurity    Worry: Not on file    Inability: Not on file  . Transportation needs    Medical: Not on file    Non-medical: Not on file  Tobacco Use  . Smoking status: Never Smoker  . Smokeless  tobacco: Never Used  Substance and Sexual Activity  . Alcohol use: Yes    Comment: Occasional  . Drug use: No  . Sexual activity: Yes    Birth control/protection: Post-menopausal    Comment: 1st intercourse 55 yo-1 partners  Lifestyle  . Physical activity    Days per week: Not on file    Minutes per session: Not on file  . Stress: Not on file  Relationships  . Social Herbalist on phone: Not on file    Gets together: Not on file    Attends religious service: Not on file    Active member of club or organization: Not on file    Attends meetings of clubs or organizations: Not on file    Relationship status: Not on file  . Intimate partner violence    Fear of current or ex partner: Not on file    Emotionally abused: Not on file    Physically abused: Not on file    Forced sexual activity: Not on file  Other Topics Concern  . Not on file  Social History Narrative  . Not on file    No current facility-administered medications on file prior to encounter.    Current Outpatient Medications on  File Prior to Encounter  Medication Sig Dispense Refill  . acetaminophen (TYLENOL) 500 MG tablet Take 500 mg by mouth every 6 (six) hours as needed for mild pain.     Marland Kitchen BIOTIN PO Take 1 tablet by mouth at bedtime.    . Calcium Carbonate-Vit D-Min (CALCIUM 1200 PO) Take 1 tablet by mouth daily as needed (when thinks about it).     Marland Kitchen co-enzyme Q-10 30 MG capsule Take 30 mg by mouth once a week.     . Cyanocobalamin (VITAMIN B 12 PO) Take 1 tablet by mouth once a week.     Marland Kitchen ELIQUIS 5 MG TABS tablet TAKE 1 TABLET(5 MG) BY MOUTH TWICE DAILY (Patient taking differently: Take 5 mg by mouth 2 (two) times daily. ) 180 tablet 1  . ezetimibe-simvastatin (VYTORIN) 10-40 MG tablet TAKE 1 TABLET BY MOUTH DAILY (Patient taking differently: Take 1 tablet by mouth every evening. ) 90 tablet 1  . lisinopril (PRINIVIL,ZESTRIL) 10 MG tablet Take 1 tablet (10 mg total) by mouth daily. 90 tablet 1  .  Multiple Vitamins-Minerals (MULTIVITAMIN ADULT PO) Take 1 tablet by mouth once a week.     . nitroGLYCERIN (NITROSTAT) 0.4 MG SL tablet Place 1 tablet (0.4 mg total) under the tongue every 5 (five) minutes as needed for chest pain. 25 tablet 11  . TURMERIC PO Take 1 tablet by mouth once a week.     . zolpidem (AMBIEN) 5 MG tablet Take 5 mg by mouth at bedtime.     Review of Systems  Constitution: Positive for malaise/fatigue. Negative for chills, decreased appetite and weight gain.  Cardiovascular: Negative for dyspnea on exertion, leg swelling and syncope.  Endocrine: Negative for cold intolerance.  Hematologic/Lymphatic: Does not bruise/bleed easily.  Musculoskeletal: Positive for back pain, joint pain and neck pain. Negative for joint swelling.  Gastrointestinal: Positive for heartburn. Negative for abdominal pain, anorexia, change in bowel habit, hematochezia and melena.  Neurological: Negative for headaches and light-headedness.  Psychiatric/Behavioral: Negative for depression and substance abuse.  All other systems reviewed and are negative.      Objective:  Blood pressure (!) 156/77, pulse (!) 50, temperature 98.2 F (36.8 C), temperature source Oral, resp. rate 16, height 5' 2.5" (1.588 m), weight 62 kg, SpO2 97 %. Body mass index is 24.6 kg/m.  Physical Exam  Constitutional: She appears well-developed and well-nourished. No distress.  HENT:  Head: Atraumatic.  Eyes: Conjunctivae are normal.  Neck: Neck supple. No JVD present. No thyromegaly present.  Cardiovascular: Normal rate, regular rhythm, normal heart sounds and intact distal pulses. Exam reveals no gallop.  No murmur heard. Pulmonary/Chest: Effort normal and breath sounds normal.  Abdominal: Soft. Bowel sounds are normal.  Musculoskeletal: Normal range of motion.  Neurological: She is alert.  Skin: Skin is warm and dry.  Psychiatric: She has a normal mood and affect.   Radiology: Dg Chest Port 1 View  Result  Date: 07/08/2019 CLINICAL DATA:  Tachycardia and dizziness EXAM: PORTABLE CHEST 1 VIEW COMPARISON:  01/25/2019 FINDINGS: The heart size and mediastinal contours are within normal limits. Both lungs are clear. The visualized skeletal structures are unremarkable. IMPRESSION: No active disease. Electronically Signed   By: Inez Catalina M.D.   On: 07/08/2019 20:20   Laboratory Examination:  CMP Latest Ref Rng & Units 07/08/2019 01/25/2019 08/06/2016  Glucose 70 - 99 mg/dL 137(H) 129(H) 152(H)  BUN 8 - 23 mg/dL 18 15 27(H)  Creatinine 0.44 - 1.00 mg/dL 0.85 0.94 0.88  Sodium 135 - 145 mmol/L 137 142 137  Potassium 3.5 - 5.1 mmol/L 4.3 4.1 3.4(L)  Chloride 98 - 111 mmol/L 105 110 108  CO2 22 - 32 mmol/L _0 Calcium 8.9 - 10.3 mg/dL 8.9 9.2 9.1  Total Protein 6.0 - 8.3 g/dL - - -  Total Bilirubin 0.3 - 1.2 mg/dL - - -  Alkaline Phos 39 - 117 U/L - - -  AST 0 - 37 U/L - - -  ALT 0 - 35 U/L - - -   CBC Latest Ref Rng & Units 07/08/2019 01/25/2019 08/06/2016  WBC 4.0 - 10.5 K/uL 5.5 6.4 8.9  Hemoglobin 12.0 - 15.0 g/dL 13.2 14.1 14.7  Hematocrit 36.0 - 46.0 % 40.2 42.9 44.4  Platelets 150 - 400 K/uL 258 294 281   Lipid Panel  No results found for: CHOL, TRIG, HDL, CHOLHDL, VLDL, LDLCALC, LDLDIRECT HEMOGLOBIN A1C No results found for: HGBA1C, MPG TSH Recent Labs    01/30/19 1611  TSH 1.574   Cardiac Panel (last 3 results)  Troponin I (High Sensitivity) 1d ago (07/08/19) 1d ago (07/08/19) 1d ago (07/08/19)    Troponin I (High Sensitivity) <18 ng/L 116High Panic   97High  CM  11 CM    11/03/2018: Glucose 97, BUN/Cr 25/0.9, eGFR 60.9/73.7, Na/K 141/4.6, AST 22, ALT 19, Alk Phos 55, Bil Tot 0.6, Apo B 63. Rest of CMP normal.  Chol 155, Trig 63, HDL 71, LDL 71. TSH Normal.   Cardiac studies:   Echocardiogram 04/23/2019: Normal LV systolic function with EF 59%. Left ventricle cavity is normal in size. Normal global wall motion. Calculated EF 59%. Moderate (Grade II) mitral regurgitation. E-wave  dominant mitral inflow. Mild tricuspid regurgitation. No evidence of pulmonary hypertension.  Lexiscan myoview stress test 02/26/2019:  1. Lexiscan stress test was performed. Exercise capacity was not assessed. No stress symptoms reported. Resting blood pressure was 110/70 mmHg and peak effect blood pressure was 146/60 mmHg. The resting and stress electrocardiogram demonstrated normal sinus rhythm, normal  conduction, occasional PAC, and normal repolarization.   Stress EKG is non diagnostic for ischemia as it is a pharmacologic stress.  2. The overall quality of the study is good. There is no evidence of abnormal lung activity. Stress and rest SPECT images demonstrate homogeneous tracer distribution throughout the myocardium. Gated SPECT imaging reveals normal myocardial thickening and wall motion. The left ventricular ejection fraction was normal (52%).   3. Low risk study.   Coronary Angiography & Balloon Angioplastyof LAD in 1989 and 1990  Assessment:   1. NSTEMI  EKG 07/08/2019: Normal sinus rhythm at rate of 60 bpm, normal axis, nonspecific T-wave flattening in anteroseptal lead.  No significant change from prior EKG 02/15/2019. 2. Paroxysmal atrial fibrillation (HCC) CHA2DS2-VASc Score is 5. Yearly risk of stroke: 6.7% 3. Essential hypertension. 4. Hyperlipidemia. Controlled on Vytorin.   Plan:  Patient clearly has progressively increasing serum troponin, although no chest pain, she has anginal equivalent fatigue.  She has been fairly active and swimming until 2 years ago when she has slowed down since her husband's death.  Given her symptoms, we'll proceed with cardiac catheterization. No clinical e/o CHF or uncontrolled hypertension to explain her elevated S. Troponin. Stress cardiomyopathy cannot be excluded in the absence of EKG abnormality.  On further questioning, she has had episodes of heartburn over the past 1 week associated with marked fatigue, dizziness, and has had  occasional episodes of breakthrough palpitations.  Suspect this is her anginal equivalent  and also had left arm pain this morning that got relieved with 3 sublingual nitroglycerin.  Discussed risks, benefits and alternatives of angiogram including but not limited to <1% risk of death, stroke, MI, need for urgent surgical revascularization, renal failure, but not limited to thest. patient is willing to proceed.   Adrian Prows, MD, [facility] 07/09/2019, 1:58 AM [facility]. [place] Pager: [phone] Office: [phone] If no answer Cell [phone]

## 2019-07-09 NOTE — Interval H&P Note (Signed)
History and Physical Interval Note:  07/09/2019 4:32 PM  Erica Frank  has presented today for surgery, with the diagnosis of NSTEMI.  The various methods of treatment have been discussed with the patient and family. After consideration of risks, benefits and other options for treatment, the patient has consented to  Procedure(s) with comments: LEFT HEART CATH AND CORONARY ANGIOGRAPHY and possible intervention (N/A) - 4: or 4:30 PM today as a surgical intervention.  The patient's history has been reviewed, patient examined, no change in status, stable for surgery.  I have reviewed the patient's chart and labs.  Questions were answered to the patient's satisfaction.   Cath Lab Visit (complete for each Cath Lab visit)  Clinical Evaluation Leading to the Procedure:   ACS: Yes.    Non-ACS:    Anginal Classification: CCS IV  Anti-ischemic medical therapy: Minimal Therapy (1 class of medications)  Non-Invasive Test Results: No non-invasive testing performed  Prior CABG: No previous CABG        Adrian Prows

## 2019-07-09 NOTE — Progress Notes (Signed)
ANTICOAGULATION CONSULT NOTE - Initial Consult  Pharmacy Consult for Heparin Indication: atrial fibrillation  No Known Allergies  Patient Measurements: Height: 5\' 2"  (157.5 cm) Weight: 135 lb (61.2 kg) IBW/kg (Calculated) : 50.1  Vital Signs: Temp: 98.5 F (36.9 C) (08/02 1800) Temp Source: Oral (08/02 1800) BP: 159/74 (08/02 2230) Pulse Rate: 51 (08/02 2230)  Labs: Recent Labs    07/08/19 1826 07/08/19 2151  HGB 13.2  --   HCT 40.2  --   PLT 258  --   CREATININE 0.85  --   TROPONINIHS 11 97*    Estimated Creatinine Clearance: 47.7 mL/min (by C-G formula based on SCr of 0.85 mg/dL).   Medical History: Past Medical History:  Diagnosis Date  . Anxiety   . Arthritis   . Atherosclerosis   . Atrial fibrillation ([place])   . Coronary artery disease   . Heart disease   . High cholesterol   . Hypertension     Medications:  No current facility-administered medications on file prior to encounter.    Current Outpatient Medications on File Prior to Encounter  Medication Sig Dispense Refill  . acetaminophen (TYLENOL) 500 MG tablet Take 500 mg by mouth every 6 (six) hours as needed for mild pain.     Marland Kitchen BIOTIN PO Take 1 tablet by mouth at bedtime.    . Calcium Carbonate-Vit D-Min (CALCIUM 1200 PO) Take 1 tablet by mouth daily as needed (when thinks about it).     Marland Kitchen co-enzyme Q-10 30 MG capsule Take 30 mg by mouth once a week.     . Cyanocobalamin (VITAMIN B 12 PO) Take 1 tablet by mouth once a week.     Marland Kitchen ELIQUIS 5 MG TABS tablet TAKE 1 TABLET(5 MG) BY MOUTH TWICE DAILY (Patient taking differently: Take 5 mg by mouth 2 (two) times daily. ) 180 tablet 1  . ezetimibe-simvastatin (VYTORIN) 10-40 MG tablet TAKE 1 TABLET BY MOUTH DAILY (Patient taking differently: Take 1 tablet by mouth every evening. ) 90 tablet 1  . lisinopril (PRINIVIL,ZESTRIL) 10 MG tablet Take 1 tablet (10 mg total) by mouth daily. 90 tablet 1  . Multiple Vitamins-Minerals (MULTIVITAMIN ADULT PO) Take 1  tablet by mouth once a week.     . nitroGLYCERIN (NITROSTAT) 0.4 MG SL tablet Place 1 tablet (0.4 mg total) under the tongue every 5 (five) minutes as needed for chest pain. 25 tablet 11  . TURMERIC PO Take 1 tablet by mouth once a week.     . zolpidem (AMBIEN) 5 MG tablet Take 5 mg by mouth at bedtime.       Assessment: 78 y.o. female admitted with NSTEMI, h/o Afib and Eliquis on hold, for heparin.  Eliquis last taken at 3 pm 8/2  Goal of Therapy:  APTT 66-102 Heparin level 0.3-0.7 units/ml Monitor platelets by anticoagulation protocol: Yes   Plan:  Start heparin 850 units/hr APTT in 8 hours  Caryl Pina 07/09/2019,12:36 AM

## 2019-07-09 NOTE — ED Notes (Signed)
Date and time results received: 07/09/19 1250 (use smartphrase ".now" to insert current time)  Test: Troponin Critical Value: 116  Name of Provider Notified: Dr. Rogene Houston & Dr. Einar Gip  Orders Received? Or Actions Taken?: Actions Taken: Notified Dr. Einar Gip; will remain NPO after midnight and plan for Heparin

## 2019-07-09 NOTE — Progress Notes (Signed)
Patient arrived to unit.  Cardiology notified.  Received report from ED about possible cath but no orders.  Paged for clarification.

## 2019-07-10 ENCOUNTER — Encounter (HOSPITAL_COMMUNITY): Payer: Self-pay | Admitting: Cardiology

## 2019-07-10 DIAGNOSIS — R001 Bradycardia, unspecified: Secondary | ICD-10-CM

## 2019-07-10 LAB — CBC
HCT: 40.1 % (ref 36.0–46.0)
Hemoglobin: 13.3 g/dL (ref 12.0–15.0)
MCH: 31.4 pg (ref 26.0–34.0)
MCHC: 33.2 g/dL (ref 30.0–36.0)
MCV: 94.8 fL (ref 80.0–100.0)
Platelets: 266 K/uL (ref 150–400)
RBC: 4.23 MIL/uL (ref 3.87–5.11)
RDW: 13.3 % (ref 11.5–15.5)
WBC: 7.3 K/uL (ref 4.0–10.5)
nRBC: 0 % (ref 0.0–0.2)

## 2019-07-10 LAB — BASIC METABOLIC PANEL WITH GFR
Anion gap: 10 (ref 5–15)
BUN: 14 mg/dL (ref 8–23)
CO2: 22 mmol/L (ref 22–32)
Calcium: 8.9 mg/dL (ref 8.9–10.3)
Chloride: 108 mmol/L (ref 98–111)
Creatinine, Ser: 0.93 mg/dL (ref 0.44–1.00)
GFR calc Af Amer: >60 mL/min
GFR calc non Af Amer: 59 mL/min — ABNORMAL LOW
Glucose, Bld: 109 mg/dL — ABNORMAL HIGH (ref 70–99)
Potassium: 3.7 mmol/L (ref 3.5–5.1)
Sodium: 140 mmol/L (ref 135–145)

## 2019-07-10 MED ORDER — CLOPIDOGREL BISULFATE 75 MG PO TABS: 75.0000 mg | ORAL_TABLET | Freq: Every day | ORAL | 2 refills | Status: DC

## 2019-07-10 MED ORDER — METOPROLOL SUCCINATE ER 25 MG PO TB24: 12.5000 mg | ORAL_TABLET | Freq: Every day | ORAL | Status: DC

## 2019-07-10 MED ORDER — ASPIRIN 81 MG PO CHEW: 81.0000 mg | CHEWABLE_TABLET | Freq: Every day | ORAL | 0 refills | Status: AC

## 2019-07-10 MED ORDER — METOPROLOL SUCCINATE ER 25 MG PO TB24: 12.5000 mg | ORAL_TABLET | Freq: Every day | ORAL | 1 refills | Status: DC

## 2019-07-10 MED FILL — METOPROLOL SUCCINATE ER 25: 25 | 60 days supply | Qty: 30 | Fill #0

## 2019-07-10 MED FILL — CLOPIDOGREL 75 MG TABLET: 75 | 30 days supply | Qty: 30 | Fill #0

## 2019-07-10 NOTE — Discharge Summary (Signed)
Physician Discharge Summary  Patient ID: Erica Frank MRN: [id number] 78 y.o. DOB/AGE: 05/07/1941 78 y.o.  Admit date: 07/08/2019 Discharge date: 07/10/2019  Primary Discharge Diagnosis NSTEMI CAD of the native vessels with Unstable angina Essential hypertension Hypercholesterolemia Bradycardia by EKG Paroxysmal atrial fibrillation (HCC) CHA2DS2-VASc Score is 5. Yearly risk of stroke: 6.7%  Significant Diagnostic Studies:  Coronary Angiography 07/09/2019: Moderate amount of diffuse coronary calcification noted in all coronary vessels including left main.  Right coronary artery and ramus intermediate showed mid 30% stenosis.  Mid LAD had a focal moderately calcified high-grade 95% stenosis S/P  STENT SYNERGY DES 3X24. Maximum pressure: 16 atm. Inflation time: 75 sec. Stent strut is well apposed.  There was residual 0% stenosis with maintenance of TIMI-3 TIMI-3 flow  EKG 07/10/2019: Marked S. Bradycardia @ 56/min, otherwise normal EKG.  EKG 07/08/2019: Normal sinus rhythm at rate of 60 bpm, normal axis, nonspecific T-wave flattening in anteroseptal lead.  No significant change from prior EKG 02/15/2019.  Clinic studies: Echocardiogram 04/23/2019: Normal LV systolic function with EF 59%. Left ventricle cavity is normal in size. Normal global wall motion. Calculated EF 59%. Moderate (Grade II) mitral regurgitation. E-wave dominant mitral inflow. Mild tricuspid regurgitation. No evidence of pulmonary hypertension.  Lexiscan myoview stress test 02/26/2019: 1. Lexiscan stress test was performed. Exercise capacity was not assessed. No stress symptoms reported. Resting blood pressure was 110/70 mmHg and peak effect blood pressure was 146/60 mmHg. The resting and stress electrocardiogram demonstrated normal sinus rhythm, normal conduction, occasional PAC, and normal repolarization. Stress EKG is non diagnostic for ischemia as it is a pharmacologic stress.  2. The overall quality of the study is  good. There is no evidence of abnormal lung activity. Stress and rest SPECT images demonstrate homogeneous tracer distribution throughout the myocardium. Gated SPECT imaging reveals normal myocardial thickening and wall motion. The left ventricular ejection fraction was normal (52%).  3. Low risk study.  Hospital Course:  Erica Frank  is a 78 y.o. female  with coronary artery disease and remote angioplasty to the LAD in 1989 and 1990, paroxysmal atrial fibrillation S/P cardioversion on 01/25/2019 and on Eliquis, hypertension, was last seen by me on 04/26/2019, lately she been complaining of worsening fatigue.  He felt that her symptoms are similar to anginal equivalent pain and hence presented to the ED, where EKG revealed lost weight changes, however high since to serum troponin was severely positive for non-STEMI hence needing admission.  Denies chest pain or shortness of breath.  No bleeding diathesis on Eliquis. On further questioning, patient states that for the past 1 week she has had heartburn on and off.  This morning she also had an episode of left arm pain without chest pain that got relieved with 3 sublingual nitroglycerin. In view of abnormal S. Troponin underwent coronary angiogram and found to have high grades stenosis of the Mid LAD and underwent successful angioplasty.  Did well next day and felt stable for discharge.  Recommendations on discharge: Patient will be discharged home.  ASA and plavix for 4 weeks then Plavix only along with Eliquis. In view of bradycardia, I have only started her on very low dose Metoprolol XL.  Discharge Exam: Blood pressure 117/69, pulse 60, temperature 97.8 F (36.6 C), temperature source Oral, resp. rate 18, height 5' 2.5" (1.588 m), weight 60.9 kg, SpO2 94 %.  Physical Exam  Constitutional: She appears well-developed and well-nourished. No distress.  HENT:  Head: Atraumatic.  Eyes: Conjunctivae are normal.  Neck: Neck supple.  No JVD present.  No thyromegaly present.  Cardiovascular: Normal rate, regular rhythm, normal heart sounds and intact distal pulses. Exam reveals no gallop.  No murmur heard. Pulmonary/Chest: Effort normal and breath sounds normal.  Abdominal: Soft. Bowel sounds are normal.  Musculoskeletal: Normal range of motion.  Neurological: She is alert.  Skin: Skin is warm and dry.  Psychiatric: She has a normal mood and affect.  Labs:   Lab Results  Component Value Date   WBC 7.3 07/10/2019   HGB 13.3 07/10/2019   HCT 40.1 07/10/2019   MCV 94.8 07/10/2019   PLT 266 07/10/2019    Recent Labs  Lab 07/10/19 0603  NA 140  K 3.7  CL 108  CO2 22  BUN 14  CREATININE 0.93  CALCIUM 8.9  GLUCOSE 109*    Lipid Panel  No results found for: CHOL, TRIG, HDL, CHOLHDL, VLDL, LDLCALC 11/03/2018: Glucose 97, BUN/Cr 25/0.9, eGFR 60.9/73.7, Na/K 141/4.6, AST 22, ALT 19, Alk Phos 55, Bil Tot 0.6, Apo B 63. Rest of CMP normal. Chol 155, Trig 63, HDL 71, LDL 71. TSH Normal.    Troponin I (High Sensitivity) 1d ago (07/08/19) 1d ago (07/08/19) 1d ago (07/08/19)    Troponin I (High Sensitivity) <18 ng/L 116High Panic   97High  CM  11 CM    BNP (last 3 results) No results for input(s): BNP in the last 8760 hours.  HEMOGLOBIN A1C No results found for: HGBA1C, MPG  Cardiac Panel (last 3 results) No results for input(s): CKTOTAL, CKMB, TROPONINI, RELINDX in the last 8760 hours.  Lab Results  Component Value Date   CKTOTAL 82 09/11/2011   CKMB 1.7 09/11/2011     TSH Recent Labs    01/30/19 1611  TSH 1.574   Radiology: Dg Chest Port 1 View  Result Date: 07/08/2019 CLINICAL DATA:  Tachycardia and dizziness EXAM: PORTABLE CHEST 1 VIEW COMPARISON:  01/25/2019 FINDINGS: The heart size and mediastinal contours are within normal limits. Both lungs are clear. The visualized skeletal structures are unremarkable. IMPRESSION: No active disease. Electronically Signed   By: Inez Catalina M.D.   On: 07/08/2019 20:20       FOLLOW UP PLANS AND APPOINTMENTS Discharge Instructions    Amb Referral to Cardiac Rehabilitation   Complete by: As directed    Diagnosis:  Coronary Stents PTCA NSTEMI     After initial evaluation and assessments completed: Virtual Based Care may be provided alone or in conjunction with Phase 2 Cardiac Rehab based on patient barriers.: Yes     Allergies as of 07/10/2019   No Known Allergies     Medication List    TAKE these medications   acetaminophen 500 MG tablet Commonly known as: TYLENOL Take 500 mg by mouth every 6 (six) hours as needed for mild pain.   aspirin 81 MG chewable tablet Chew 1 tablet (81 mg total) by mouth daily. For 30 days only Start taking on: July 11, 2019   BIOTIN PO Take 1 tablet by mouth at bedtime.   CALCIUM 1200 PO Take 1 tablet by mouth daily as needed (when thinks about it).   clopidogrel 75 MG tablet Commonly known as: Plavix Take 1 tablet (75 mg total) by mouth daily.   co-enzyme Q-10 30 MG capsule Take 30 mg by mouth once a week.   Eliquis 5 MG Tabs tablet Generic drug: apixaban TAKE 1 TABLET(5 MG) BY MOUTH TWICE DAILY What changed: See the new instructions.   ezetimibe-simvastatin 10-40 MG tablet Commonly  known as: VYTORIN TAKE 1 TABLET BY MOUTH DAILY What changed: when to take this   lisinopril 10 MG tablet Commonly known as: ZESTRIL Take 1 tablet (10 mg total) by mouth daily.   metoprolol succinate 25 MG 24 hr tablet Commonly known as: TOPROL-XL Take 0.5 tablets (12.5 mg total) by mouth daily. Start taking on: July 11, 2019   MULTIVITAMIN ADULT PO Take 1 tablet by mouth once a week.   nitroGLYCERIN 0.4 MG SL tablet Commonly known as: NITROSTAT Place 1 tablet (0.4 mg total) under the tongue every 5 (five) minutes as needed for chest pain.   TURMERIC PO Take 1 tablet by mouth once a week.   VITAMIN B 12 PO Take 1 tablet by mouth once a week.   zolpidem 5 MG tablet Commonly known as: AMBIEN Take 5 mg by  mouth at bedtime.      Follow-up Information    Adrian Prows, MD. Call.   Specialty: Cardiology Why: To be seen in 10 days. Our office will also call you Contact information: [place] [address] [phone]          Adrian Prows, MD 07/10/2019, 2:39 PM  Pager: [phone] Office: [phone] If no answer: [phone]

## 2019-07-10 NOTE — Discharge Instructions (Signed)

## 2019-07-10 NOTE — Progress Notes (Signed)
TR  BAND REMOVAL  LOCATION:    right radial  DEFLATED PER PROTOCOL:    Yes.    TIME BAND OFF / DRESSING APPLIED:    22:15   SITE UPON ARRIVAL:    Level 0  SITE AFTER BAND REMOVAL:    Level 1([place])  CIRCULATION SENSATION AND MOVEMENT:    Within Normal Limits   Yes.    COMMENTS:   Post TR band instructions given. Small palpable hematoma resolved. Pt tolerated well.

## 2019-07-10 NOTE — Progress Notes (Signed)
CARDIAC REHAB PHASE I   PRE:  Rate/Rhythm: 51 SB with PVC    BP: sitting 121/66    SaO2: 96 RA  MODE:  Ambulation: 470 ft   POST:  Rate/Rhythm: 70 SR    BP: sitting 111/58     SaO2:   Tolerated well, sts she feels better. Discussed MI, stent, Plavix/ASA, restrictions, diet, exercise, NTG, and CRPII. Will refer to [place] although pt prefers to exercise on her own. Pt is interested in participating in Virtual Cardiac Rehab. Pt advised that Virtual Cardiac Rehab is provided at no cost to the patient. Checklist:  1. Pt has smart device  ie smartphone and/or ipad for downloading an app  Yes 2. Reliable internet/wifi service    Yes 3. Understands how to use their smartphone and navigate within an app.  Yes Reviewed with pt the scheduling process for virtual cardiac rehab.  Pt verbalized understanding.  [place], ACSM 07/10/2019 9:15 AM

## 2019-07-12 ENCOUNTER — Telehealth (HOSPITAL_COMMUNITY): Payer: Self-pay

## 2019-07-12 ENCOUNTER — Encounter (HOSPITAL_COMMUNITY): Payer: Self-pay

## 2019-07-12 ENCOUNTER — Other Ambulatory Visit: Payer: Self-pay | Admitting: Cardiology

## 2019-07-12 ENCOUNTER — Other Ambulatory Visit: Payer: Self-pay

## 2019-07-12 DIAGNOSIS — I10 Essential (primary) hypertension: Secondary | ICD-10-CM

## 2019-07-12 MED ORDER — LISINOPRIL 10 MG PO TABS: 10.0000 mg | ORAL_TABLET | Freq: Every day | ORAL | 1 refills | Status: DC

## 2019-07-12 NOTE — Telephone Encounter (Signed)
Pt insurance is active and benefits verified through [place] $0, DED 0/0 met, out of pocket $4,200/$926.95 met, co-insurance 20%. no pre-authorization required, REF# [id number]  Will contact patient to see if she is interested in the Cardiac Rehab Program. If interested, patient will need to complete follow up appt. Once completed, patient will be contacted for scheduling upon review by the RN Navigator.

## 2019-07-16 ENCOUNTER — Encounter (HOSPITAL_COMMUNITY): Payer: Self-pay | Admitting: Emergency Medicine

## 2019-07-16 ENCOUNTER — Ambulatory Visit: Payer: Medicare Other | Admitting: Cardiology

## 2019-07-16 ENCOUNTER — Other Ambulatory Visit: Payer: Self-pay

## 2019-07-16 ENCOUNTER — Emergency Department (HOSPITAL_COMMUNITY)
Admission: EM | Admit: 2019-07-16 | Discharge: 2019-07-16 | Disposition: A | Discharge status: Home / Self Care | Payer: Medicare Other | Attending: Emergency Medicine | Admitting: Emergency Medicine

## 2019-07-16 ENCOUNTER — Encounter: Payer: Self-pay | Admitting: Cardiology

## 2019-07-16 ENCOUNTER — Emergency Department (HOSPITAL_COMMUNITY): Payer: Medicare Other

## 2019-07-16 VITALS — BP 118/73 | HR 53 | Ht 62.0 in | Wt 135.0 lb

## 2019-07-16 DIAGNOSIS — Z7901 Long term (current) use of anticoagulants: Secondary | ICD-10-CM | POA: Insufficient documentation

## 2019-07-16 DIAGNOSIS — E782 Mixed hyperlipidemia: Secondary | ICD-10-CM | POA: Diagnosis not present

## 2019-07-16 DIAGNOSIS — R0789 Other chest pain: Secondary | ICD-10-CM | POA: Insufficient documentation

## 2019-07-16 DIAGNOSIS — I10 Essential (primary) hypertension: Secondary | ICD-10-CM | POA: Diagnosis not present

## 2019-07-16 DIAGNOSIS — Z7982 Long term (current) use of aspirin: Secondary | ICD-10-CM | POA: Diagnosis not present

## 2019-07-16 DIAGNOSIS — R072 Precordial pain: Secondary | ICD-10-CM

## 2019-07-16 DIAGNOSIS — R079 Chest pain, unspecified: Secondary | ICD-10-CM | POA: Diagnosis not present

## 2019-07-16 DIAGNOSIS — Z79899 Other long term (current) drug therapy: Secondary | ICD-10-CM | POA: Insufficient documentation

## 2019-07-16 DIAGNOSIS — I480 Paroxysmal atrial fibrillation: Secondary | ICD-10-CM

## 2019-07-16 DIAGNOSIS — I2510 Atherosclerotic heart disease of native coronary artery without angina pectoris: Secondary | ICD-10-CM | POA: Insufficient documentation

## 2019-07-16 DIAGNOSIS — I25118 Atherosclerotic heart disease of native coronary artery with other forms of angina pectoris: Secondary | ICD-10-CM

## 2019-07-16 LAB — CBC
HCT: 40.9 % (ref 36.0–46.0)
Hemoglobin: 13.4 g/dL (ref 12.0–15.0)
MCH: 31.4 pg (ref 26.0–34.0)
MCHC: 32.8 g/dL (ref 30.0–36.0)
MCV: 95.8 fL (ref 80.0–100.0)
Platelets: 271 10*3/uL (ref 150–400)
RBC: 4.27 MIL/uL (ref 3.87–5.11)
RDW: 13 % (ref 11.5–15.5)
WBC: 7.8 10*3/uL (ref 4.0–10.5)
nRBC: 0 % (ref 0.0–0.2)

## 2019-07-16 LAB — COMPREHENSIVE METABOLIC PANEL
ALT: 22 U/L (ref 0–44)
AST: 25 U/L (ref 15–41)
Albumin: 3.9 g/dL (ref 3.5–5.0)
Alkaline Phosphatase: 58 U/L (ref 38–126)
Anion gap: 12 (ref 5–15)
BUN: 21 mg/dL (ref 8–23)
CO2: 23 mmol/L (ref 22–32)
Calcium: 9.3 mg/dL (ref 8.9–10.3)
Chloride: 104 mmol/L (ref 98–111)
Creatinine, Ser: 1.13 mg/dL — ABNORMAL HIGH (ref 0.44–1.00)
GFR calc Af Amer: 54 mL/min — ABNORMAL LOW (ref >60)
GFR calc non Af Amer: 47 mL/min — ABNORMAL LOW (ref >60)
Glucose, Bld: 114 mg/dL — ABNORMAL HIGH (ref 70–99)
Potassium: 4.5 mmol/L (ref 3.5–5.1)
Sodium: 139 mmol/L (ref 135–145)
Total Bilirubin: 0.7 mg/dL (ref 0.3–1.2)
Total Protein: 6.3 g/dL — ABNORMAL LOW (ref 6.5–8.1)

## 2019-07-16 LAB — TROPONIN I (HIGH SENSITIVITY)
Troponin I (High Sensitivity): 4 ng/L (ref <18)
Troponin I (High Sensitivity): 6 ng/L (ref <18)

## 2019-07-16 MED ORDER — ISOSORBIDE MONONITRATE ER 60 MG PO TB24: 60.0000 mg | ORAL_TABLET | Freq: Every day | ORAL | 3 refills | Status: DC

## 2019-07-16 NOTE — ED Triage Notes (Signed)
EMS transport from Home. Pt had chest pressure frinm 1200-0100 took =3 nitro at home. Chest pressure relieved on arrival. Pt had cardiac stent placed 07/09/2019

## 2019-07-16 NOTE — ED Provider Notes (Signed)
[facility]  CSN: [id number] Arrival date & time: 07/16/19 0236  Chief Complaint(s) Chest Pain  HPI Erica Frank is a 78 y.o. female with a past medical history listed below including CAD with recent NSTEMI s/p DES to the mid LAD on 07/09/19 who presents to the emergency department with substernal chest pressure that radiated to the left shoulder similar to recent admission for an MI.  Pain started while at rest.  Completely resolved after 3 nitroglycerin.  Endorsed some nausea without emesis.  No shortness of breath.  No recent fevers or infections.  No abdominal pain.  No other physical complaints.  Currently chest pain-free.  HPI  Past Medical History Past Medical History:  Diagnosis Date   Anxiety    Arthritis    Atherosclerosis    Atrial fibrillation (HCC)    Coronary artery disease    Heart disease    High cholesterol    Hypertension    Patient Active Problem List   Diagnosis Date Noted   Elevated troponin 07/09/2019   NSTEMI (non-ST elevated myocardial infarction) (HCC) 07/09/2019   Paroxysmal atrial fibrillation (HCC) 02/15/2019   Mixed hyperlipidemia 02/15/2019   Bradycardia 02/15/2019   Coronary artery disease involving native coronary artery of native heart with unstable angina pectoris (HCC) 12/20/2018   Dyslipidemia 12/20/2018   Essential hypertension 12/20/2018   Home Medication(s) Prior to Admission medications   Medication Sig Start Date End Date Taking? Authorizing Provider  acetaminophen (TYLENOL) 500 MG tablet Take 500 mg by mouth every 6 (six) hours as needed for mild pain.    Yes [provider]  aspirin 81 MG chewable tablet Chew 1 tablet (81 mg total) by mouth daily. For 30 days only 07/11/19 08/10/19 Yes Yates DecampGanji, Jay, MD  BIOTIN PO Take 1 tablet by mouth at bedtime.   Yes [provider]  Calcium Carbonate-Vit D-Min (CALCIUM 1200 PO) Take 1 tablet by mouth daily as  needed (when thinks about it).    Yes [provider]  clopidogrel (PLAVIX) 75 MG tablet Take 1 tablet (75 mg total) by mouth daily. 07/10/19 10/08/19 Yes Yates DecampGanji, Jay, MD  co-enzyme Q-10 30 MG capsule Take 30 mg by mouth once a week.    Yes [provider]  Cyanocobalamin (VITAMIN B 12 PO) Take 1 tablet by mouth once a week.    Yes [provider]  ELIQUIS 5 MG TABS tablet TAKE 1 TABLET(5 MG) BY MOUTH TWICE DAILY Patient taking differently: Take 5 mg by mouth 2 (two) times daily.  03/15/19  Yes Yates DecampGanji, Jay, MD  ezetimibe-simvastatin (VYTORIN) 10-40 MG tablet TAKE 1 TABLET BY MOUTH DAILY 02/09/19  Yes Georgeanna LeaKrasowski, Robert J, MD  lisinopril (ZESTRIL) 10 MG tablet Take 1 tablet (10 mg total) by mouth daily. 07/12/19 10/10/19 Yes Yates DecampGanji, Jay, MD  metoprolol succinate (TOPROL-XL) 25 MG 24 hr tablet Take 0.5 tablets (12.5 mg total) by mouth daily. 07/11/19  Yes Yates DecampGanji, Jay, MD  Multiple Vitamins-Minerals (MULTIVITAMIN ADULT PO) Take 1 tablet by mouth once a week.    Yes [provider]  nitroGLYCERIN (NITROSTAT) 0.4 MG SL tablet Place 1 tablet (0.4 mg total) under the tongue every 5 (five) minutes as needed for chest pain. 12/20/18  Yes Georgeanna LeaKrasowski, Robert J, MD  TURMERIC PO Take 1 tablet by mouth once a week.    Yes [provider]  zolpidem (AMBIEN) 5 MG tablet Take 5 mg by mouth at bedtime. 06/21/19  Yes [provider]  Past Surgical History Past Surgical History:  Procedure Laterality Date   ABDOMINAL HYSTERECTOMY  1983   for endometriosis with Burch   ANGIOPLASTY     ANGIOPLASTY     BACK SURGERY     CARDIAC SURGERY     Catheterization   CORONARY STENT INTERVENTION N/A 07/09/2019   Procedure: CORONARY STENT INTERVENTION;  Surgeon: Yates Decamp, MD;  Location: [facility] IN[facility]IVE [facility];  Service: Cardiovascular;  Laterality: N/A;   LEFT  HEART CATH AND CORONARY ANGIOGRAPHY N/A 07/09/2019   Procedure: LEFT HEART CATH AND CORONARY ANGIOGRAPHY and possible intervention;  Surgeon: Yates Decamp, MD;  Location: [facility] INVASIVE CV LAB;  Service: Cardiovascular;  Laterality: N/A;  4: or 4:30 PM today   NECK SURGERY     Family History Family History  Problem Relation Age of Onset   Uterine cancer Mother    Hypertension Brother    Heart disease Brother    Breast cancer Cousin     Social History Social History   Tobacco Use   Smoking status: Never Smoker   Smokeless tobacco: Never Used  Substance Use Topics   Alcohol use: Yes    Comment: Occasional   Drug use: No   Allergies Patient has no known allergies.  Review of Systems Review of Systems All other systems are reviewed and are negative for acute change except as noted in the HPI  Physical Exam Vital Signs  I have reviewed the triage vital signs BP (!) 151/80 (BP Location: Right Arm)    Pulse (!) 55    Temp 98.1 F (36.7 C) (Oral)    Resp (!) 21    Ht 5' 2.5" (1.588 m)    Wt 60.8 kg    SpO2 95%    BMI 24.12 kg/m   Physical Exam Vitals signs reviewed.  Constitutional:      General: She is not in acute distress.    Appearance: She is well-developed. She is not diaphoretic.  HENT:     Head: Normocephalic and atraumatic.     Nose: Nose normal.  Eyes:     General: No scleral icterus.       Right eye: No discharge.        Left eye: No discharge.     Conjunctiva/sclera: Conjunctivae normal.     Pupils: Pupils are equal, round, and reactive to light.  Neck:     Musculoskeletal: Normal range of motion and neck supple.  Cardiovascular:     Rate and Rhythm: Regular rhythm. Bradycardia present.     Heart sounds: No murmur. No friction rub. No gallop.      Comments: Right wrist cath site with ecchymosis. 2+pulse  Pulmonary:     Effort: Pulmonary effort is normal. No respiratory distress.     Breath sounds: Normal breath sounds. No stridor. No rales.    Abdominal:     General: There is no distension.     Palpations: Abdomen is soft.     Tenderness: There is no abdominal tenderness.  Musculoskeletal:        General: No tenderness.  Skin:    General: Skin is warm and dry.     Findings: No erythema or rash.  Neurological:     Mental Status: She is alert and oriented to person, place, and time.     ED Results and Treatments Labs (all labs ordered are listed, but only abnormal results are displayed) Labs Reviewed  COMPREHENSIVE METABOLIC PANEL - Abnormal; Notable for the following components:  Result Value   Glucose, Bld 114 (*)    Creatinine, Ser 1.13 (*)    Total Protein 6.3 (*)    GFR calc non Af Amer 47 (*)    GFR calc Af Amer 54 (*)    All other components within normal limits  CBC  TROPONIN I (HIGH SENSITIVITY)  TROPONIN I (HIGH SENSITIVITY)                                                                                                                         EKG  EKG Interpretation  Date/Time:  Monday July 16 2019 02:46:32 EDT Ventricular Rate:  55 PR Interval:    QRS Duration: 101 QT Interval:  444 QTC Calculation: 425 R Axis:   -12 Text Interpretation:  Sinus rhythm Nonspecific T abnormalities, diffuse leads No significant change since last tracing Confirmed by Drema Pryardama, Twain Stenseth [phone]([id number]) on 07/16/2019 2:56:52 AM      Radiology Dg Chest 2 View  Result Date: 07/16/2019 CLINICAL DATA:  78 year old female with chest pain. EXAM: CHEST - 2 VIEW COMPARISON:  Chest radiograph dated 07/08/2019 FINDINGS: Minimal left lung base atelectatic changes. No focal consolidation, pleural effusion, or pneumothorax. The cardiac silhouette is within normal limits. Coronary vascular calcification or stent. Prominence of the right hilum stable since radiograph of 08/06/2016. No acute osseous pathology. IMPRESSION: No active cardiopulmonary disease. Electronically Signed   By: Elgie CollardArash  Radparvar M.D.   On: 07/16/2019 03:51    Pertinent  labs & imaging results that were available during my care of the patient were reviewed by me and considered in my medical decision making (see chart for details).  Medications Ordered in ED Medications - No data to display                                                                                                                                  Procedures Procedures  (including critical care time)  Medical Decision Making / ED Course I have reviewed the nursing notes for this encounter and the patient's prior records (if available in EHR or on provided paperwork).   Erica Decemberancy Kinney Frank was evaluated in Emergency Department on 07/16/2019 for the symptoms described in the history of present illness. She was evaluated in the context of the global COVID-19 pandemic, which necessitated consideration that the patient might be at risk for infection with the SARS-CoV-2 virus that causes COVID-19. Institutional protocols and  algorithms that pertain to the evaluation of patients at risk for COVID-19 are in a state of rapid change based on information released by regulatory bodies including the CDC and federal and state organizations. These policies and algorithms were followed during the patient's care in the ED.  Patient presents with symptoms concerning for unstable angina.  EKG without acute ischemic changes or evidence of pericarditis.  Initial troponin negative.  Chest x-ray without evidence suggestive of pneumonia, pneumothorax, pneumomediastinum.  No abnormal contour of the mediastinum to suggest dissection. No evidence of acute injuries.  Low suspicion for pulmonary embolism, presentation not classic for aortic dissection or esophageal perforation.  Will discuss case with Dr. Einar Gip.  He believes this might be vasospasms due to recent stenting. Requested second trop. If negative, give Imdur and amlodipine here.  Second trop negative. BPs trended down to 110s. Patient wanted to hold off on  Imdur and amlodipine due to BPs until she saw Dr. Einar Gip this afternoon.   The patient appears reasonably screened and/or stabilized for discharge and I doubt any other medical condition or other [facility] requiring further screening, evaluation, or treatment in the ED at this time prior to discharge.  The patient is safe for discharge with strict return precautions.       Final Clinical Impression(s) / ED Diagnoses Final diagnoses:  Chest pain  Precordial chest pain    The patient appears reasonably screened and/or stabilized for discharge and I doubt any other medical condition or other [facility] requiring further screening, evaluation, or treatment in the ED at this time prior to discharge.  Disposition: Discharge  Condition: Good  I have discussed the results, Dx and Tx plan with the patient and daughter who expressed understanding and agree(s) with the plan. Discharge instructions discussed at great length. The patient and daughter was given strict return precautions who verbalized understanding of the instructions. No further questions at time of discharge.    ED Discharge Orders    None        Follow Up: Adrian Prows, MD [place] [id number] [phone]  Go to  as scheduled this afternoon      This chart was dictated using voice recognition software.  Despite best efforts to proofread,  errors can occur which can change the documentation meaning.   Fatima Blank, MD 07/16/19 [phone]

## 2019-07-16 NOTE — Discharge Instructions (Signed)

## 2019-07-16 NOTE — Progress Notes (Signed)
Primary Physician/Referring:  Prince Solian, MD  Patient ID: Erica Frank, female    DOB: May 08, 1941, 78 y.o.   MRN: [id number]  Chief Complaint  Patient presents with  . Coronary Artery Disease  . Follow-up    HPI: Erica Frank  is a 78 y.o. female  with CAD and balloon angioplasty in [place], hypertension, hyperlipidemia, and paroxysmal atrial fibrillation. Episode was on 01/25/2019 when she woke up with palpitations and was taken to the emergency room.  She underwent direct current cardioversion and was discharged home.  Patient presented on 07/09/2019 to the emergency room complaining of marked fatigue, heart burning sensation and also left arm discomfort, felt that these were the symptoms of her angina, mostly fatigue.  She was positive for non-STEMI, underwent coronary angiography and angioplasty and stenting to the mid LAD.  She presented this morning to the emergency room complaining of chest discomfort again, I felt that it was probably related to coronary spasm, cardiac markers were negative for myocardial injury and she was discharged home after EKG revealing normal sinus rhythm with nonspecific T abnormality unchanged from prior EKG.  She now presents to the office.  States that since angioplasty she is feeling the best she has in quite a few years and all her fatigue has completely resolved.  Past Medical History:  Diagnosis Date  . Anxiety   . Arthritis   . Atherosclerosis   . Atrial fibrillation ([place])   . Coronary artery disease   . Heart disease   . High cholesterol   . Hypertension     Past Surgical History:  Procedure Laterality Date  . ABDOMINAL HYSTERECTOMY  1983   for endometriosis with Burch  . ANGIOPLASTY    . ANGIOPLASTY    . BACK SURGERY    . CARDIAC SURGERY     Catheterization  . CORONARY STENT INTERVENTION N/A 07/09/2019   Procedure: CORONARY STENT INTERVENTION;  Surgeon: Adrian Prows, MD;  Location: [place] CV LAB;  Service:  Cardiovascular;  Laterality: N/A;  . LEFT HEART CATH AND CORONARY ANGIOGRAPHY N/A 07/09/2019   Procedure: LEFT HEART CATH AND CORONARY ANGIOGRAPHY and possible intervention;  Surgeon: Adrian Prows, MD;  Location: [place] CV LAB;  Service: Cardiovascular;  Laterality: N/A;  4: or 4:30 PM today  . NECK SURGERY      Social History   Socioeconomic History  . Marital status: Widowed    Spouse name: Not on file  . Number of children: 1  . Years of education: Not on file  . Highest education level: Not on file  Occupational History  . Not on file  Social Needs  . Financial resource strain: Not on file  . Food insecurity    Worry: Not on file    Inability: Not on file  . Transportation needs    Medical: Not on file    Non-medical: Not on file  Tobacco Use  . Smoking status: Never Smoker  . Smokeless tobacco: Never Used  Substance and Sexual Activity  . Alcohol use: Yes    Comment: Occasional  . Drug use: No  . Sexual activity: Yes    Birth control/protection: Post-menopausal    Comment: 1st intercourse 31 yo-1 partners  Lifestyle  . Physical activity    Days per week: Not on file    Minutes per session: Not on file  . Stress: Not on file  Relationships  . Social connections    Talks on phone: Not on file  Gets together: Not on file    Attends religious service: Not on file    Active member of club or organization: Not on file    Attends meetings of clubs or organizations: Not on file    Relationship status: Not on file  . Intimate partner violence    Fear of current or ex partner: Not on file    Emotionally abused: Not on file    Physically abused: Not on file    Forced sexual activity: Not on file  Other Topics Concern  . Not on file  Social History Narrative  . Not on file   Review of Systems  Constitution: Negative for chills, decreased appetite, malaise/fatigue and weight gain.  Cardiovascular: Positive for chest pain. Negative for dyspnea on exertion, leg  swelling and syncope.  Endocrine: Negative for cold intolerance.  Hematologic/Lymphatic: Does not bruise/bleed easily.  Musculoskeletal: Positive for joint pain. Negative for joint swelling.  Gastrointestinal: Negative for abdominal pain, anorexia, change in bowel habit, hematochezia and melena.  Neurological: Negative for headaches and light-headedness.  Psychiatric/Behavioral: Negative for depression and substance abuse.  All other systems reviewed and are negative.   Objective  Blood pressure 118/73, pulse (!) 53, height '5\' 2"'  (1.575 m), weight 135 lb (61.2 kg), SpO2 98 %. Body mass index is 24.69 kg/m.    Physical Exam  Constitutional: No distress.  Moderately built and nurished  HENT:  Head: Atraumatic.  Eyes: Conjunctivae are normal.  Neck: Neck supple. No JVD present. No thyromegaly present.  Cardiovascular: Normal rate, regular rhythm, normal heart sounds and intact distal pulses. Exam reveals no gallop.  No murmur heard. Pulmonary/Chest: Effort normal and breath sounds normal.  Abdominal: Soft. Bowel sounds are normal.  Musculoskeletal: Normal range of motion.  Neurological: She is alert.  Skin: Skin is warm and dry.  Psychiatric: She has a normal mood and affect.   Radiology: Dg Chest 2 View  Result Date: 07/16/2019 CLINICAL DATA:  78 year old female with chest pain. EXAM: CHEST - 2 VIEW COMPARISON:  Chest radiograph dated 07/08/2019 FINDINGS: Minimal left lung base atelectatic changes. No focal consolidation, pleural effusion, or pneumothorax. The cardiac silhouette is within normal limits. Coronary vascular calcification or stent. Prominence of the right hilum stable since radiograph of 08/06/2016. No acute osseous pathology. IMPRESSION: No active cardiopulmonary disease. Electronically Signed   By: Anner Crete M.D.   On: 07/16/2019 03:51    Laboratory examination:   11/03/2018:   Glucose 97, BUN/Cr 25/0.9, eGFR 60.9/73.7, Na/K 141/4.6, AST 22, ALT 19, Alk Phos  55, Bil Tot 0.6, Apo B 63. Rest of CMP normal.  Chol 155, Trig 63, HDL 71, LDL 71. TSH Normal.   CMP Latest Ref Rng & Units 07/16/2019 07/10/2019 07/08/2019  Glucose 70 - 99 mg/dL 114(H) 109(H) 137(H)  BUN 8 - 23 mg/dL '21 14 18  ' Creatinine 0.44 - 1.00 mg/dL 1.13(H) 0.93 0.85  Sodium 135 - 145 mmol/L 139 140 137  Potassium 3.5 - 5.1 mmol/L 4.5 3.7 4.3  Chloride 98 - 111 mmol/L 104 108 105  CO2 22 - 32 mmol/L '23 22 24  ' Calcium 8.9 - 10.3 mg/dL 9.3 8.9 8.9  Total Protein 6.5 - 8.1 g/dL 6.3(L) - -  Total Bilirubin 0.3 - 1.2 mg/dL 0.7 - -  Alkaline Phos 38 - 126 U/L 58 - -  AST 15 - 41 U/L 25 - -  ALT 0 - 44 U/L 22 - -   CBC Latest Ref Rng & Units 07/16/2019 07/10/2019 07/08/2019  WBC 4.0 - 10.5 K/uL 7.8 7.3 5.5  Hemoglobin 12.0 - 15.0 g/dL 13.4 13.3 13.2  Hematocrit 36.0 - 46.0 % 40.9 40.1 40.2  Platelets 150 - 400 K/uL 271 266 258   Lipid Panel  No results found for: CHOL, TRIG, HDL, CHOLHDL, VLDL, LDLCALC, LDLDIRECT HEMOGLOBIN A1C No results found for: HGBA1C, MPG TSH Recent Labs    01/30/19 1611  TSH 1.574    PRN Meds:. There are no discontinued medications. Current Meds  Medication Sig  . acetaminophen (TYLENOL) 500 MG tablet Take 500 mg by mouth every 6 (six) hours as needed for mild pain.   Marland Kitchen aspirin 81 MG chewable tablet Chew 1 tablet (81 mg total) by mouth daily. For 30 days only  . BIOTIN PO Take 1 tablet by mouth at bedtime.  . Calcium Carbonate-Vit D-Min (CALCIUM 1200 PO) Take 1 tablet by mouth daily as needed (when thinks about it).   . clopidogrel (PLAVIX) 75 MG tablet Take 1 tablet (75 mg total) by mouth daily.  Marland Kitchen co-enzyme Q-10 30 MG capsule Take 30 mg by mouth once a week.   . Cyanocobalamin (VITAMIN B 12 PO) Take 1 tablet by mouth once a week.   Marland Kitchen ELIQUIS 5 MG TABS tablet TAKE 1 TABLET(5 MG) BY MOUTH TWICE DAILY (Patient taking differently: Take 5 mg by mouth 2 (two) times daily. )  . ezetimibe-simvastatin (VYTORIN) 10-40 MG tablet TAKE 1 TABLET BY MOUTH DAILY  .  lisinopril (ZESTRIL) 10 MG tablet Take 1 tablet (10 mg total) by mouth daily.  . metoprolol succinate (TOPROL-XL) 25 MG 24 hr tablet Take 0.5 tablets (12.5 mg total) by mouth daily.  . Multiple Vitamins-Minerals (MULTIVITAMIN ADULT PO) Take 1 tablet by mouth once a week.   . nitroGLYCERIN (NITROSTAT) 0.4 MG SL tablet Place 1 tablet (0.4 mg total) under the tongue every 5 (five) minutes as needed for chest pain.  . TURMERIC PO Take 1 tablet by mouth once a week.   . zolpidem (AMBIEN) 5 MG tablet Take 5 mg by mouth at bedtime.    Cardiac Studies:   Echocardiogram 04/23/2019: Normal LV systolic function with EF 59%. Left ventricle cavity is normal in size. Normal global wall motion. Calculated EF 59%. Moderate (Grade II) mitral regurgitation. E-wave dominant mitral inflow. Mild tricuspid regurgitation. No evidence of pulmonary hypertension.  Lexiscan myoview stress test 02/26/2019:  1. Lexiscan stress test was performed. Exercise capacity was not assessed. No stress symptoms reported. Resting blood pressure was 110/70 mmHg and peak effect blood pressure was 146/60 mmHg. The resting and stress electrocardiogram demonstrated normal sinus rhythm, normal  conduction, occasional PAC, and normal repolarization.   Stress EKG is non diagnostic for ischemia as it is a pharmacologic stress.  2. The overall quality of the study is good. There is no evidence of abnormal lung activity. Stress and rest SPECT images demonstrate homogeneous tracer distribution throughout the myocardium. Gated SPECT imaging reveals normal myocardial thickening and wall motion. The left ventricular ejection fraction was normal (52%).   3. Low risk study.  Coronary Angiography 07/09/2019: Moderate amount of diffuse coronary calcification noted in all coronary vessels including left main. Right coronary artery and ramus intermediate showed mid 30% stenosis. Mid LAD had a focal moderately calcified high-grade 95% stenosis S/P STENT  SYNERGY DES 3X24. Maximum pressure: 16 atm. Inflation time: 75 sec. Stent strut is well apposed. There was residual 0% stenosis with maintenance of TIMI-3 TIMI-3 flow  Coronary Angiography & Balloon Angioplasty of LAD in 1989  and 1990   DCCV 01/25/2019: 120J x 1 to NSR.  Assessment     ICD-10-CM   1. Coronary artery disease of native artery of native heart with stable angina pectoris (HCC)  I25.118 EKG 12-Lead    isosorbide mononitrate (IMDUR) 60 MG 24 hr tablet    CBC    CMP14+EGFR   07/09/19: Mid LAD  SYNERGY DES 3X24  2. Essential hypertension  I10   3. Paroxysmal atrial fibrillation (HCC) CHA2DS2-VASc Score is 5. Yearly risk of stroke: 6.7%  I48.0   4. Mixed hyperlipidemia  E78.2 Lipid Panel With LDL/HDL Ratio     EKG 07/16/2019: Marked sinus bradycardia at the rate of 49 bpm, normal axis, nonspecific T abnormality, cannot exclude anterior ischemia. No significant change from  EKG 02/15/2019.  Recommendations:   Patient presents here for follow-up post-hospital  follow-up, unfortunately after the coronary angiogram and angioplasty was performed on 07/09/2019, she had excellent results but presented to the emergency room this morning with similar symptoms of angina, suspect she probably has coronary vasospasm.  She has had excellent results with angioplasty, do not suspect ACS.  She has had complete resolution of fatigue symptoms.  I have added isosorbide mononitrate 60 mg daily.  I reviewed her medications, she is on appropriate medical therapy, she'll continue with Eliquis along with ASA 81 mg and Plavix for now for CAD for 4 weeks, then she will be on Eliquis and Plavix for at least 6 months if not for a year followed by Eliquis indefinitely.  I'll like to see her back in 6 weeks for follow-up of atrial fibrillation and coronary artery disease and I'll repeat lipids, CMP and CBC.  Adrian Prows, MD, [facility] 07/16/2019, 5:37 PM [facility] [place] Pager: [phone] Office:  [phone] If no answer Cell [phone]

## 2019-07-19 ENCOUNTER — Telehealth: Payer: Self-pay

## 2019-07-19 NOTE — Telephone Encounter (Signed)
Pt called stating that  She has been having a dull headache x 3 days. Her bp today was 116/61, checked 1hr later 98/57, then 120/61. She's questioning if bp is dropping too low. Please review and advise.//ah

## 2019-07-20 NOTE — Telephone Encounter (Signed)
LMOM advising of JG instructions.//ah

## 2019-07-20 NOTE — Telephone Encounter (Signed)
The change is from the medication I put. Do not change anything, it will get better. Drink plenty of fluids

## 2019-07-23 DIAGNOSIS — E782 Mixed hyperlipidemia: Secondary | ICD-10-CM | POA: Diagnosis not present

## 2019-07-23 DIAGNOSIS — I25118 Atherosclerotic heart disease of native coronary artery with other forms of angina pectoris: Secondary | ICD-10-CM | POA: Diagnosis not present

## 2019-07-24 LAB — CMP14+EGFR
ALT: 19 IU/L (ref 0–32)
AST: 18 IU/L (ref 0–40)
Albumin/Globulin Ratio: 1.9 (ref 1.2–2.2)
Albumin: 4.3 g/dL (ref 3.7–4.7)
Alkaline Phosphatase: 63 IU/L (ref 39–117)
BUN/Creatinine Ratio: 18 (ref 12–28)
BUN: 18 mg/dL (ref 8–27)
Bilirubin Total: 0.3 mg/dL (ref 0.0–1.2)
CO2: 24 mmol/L (ref 20–29)
Calcium: 9.7 mg/dL (ref 8.7–10.3)
Chloride: 102 mmol/L (ref 96–106)
Creatinine, Ser: 1 mg/dL (ref 0.57–1.00)
GFR calc Af Amer: 63 mL/min/{1.73_m2} (ref >59)
GFR calc non Af Amer: 54 mL/min/{1.73_m2} — ABNORMAL LOW (ref >59)
Globulin, Total: 2.3 g/dL (ref 1.5–4.5)
Glucose: 88 mg/dL (ref 65–99)
Potassium: 4.7 mmol/L (ref 3.5–5.2)
Sodium: 139 mmol/L (ref 134–144)
Total Protein: 6.6 g/dL (ref 6.0–8.5)

## 2019-07-24 LAB — CBC
Hematocrit: 41.2 % (ref 34.0–46.6)
Hemoglobin: 13.7 g/dL (ref 11.1–15.9)
MCH: 31.2 pg (ref 26.6–33.0)
MCHC: 33.3 g/dL (ref 31.5–35.7)
MCV: 94 fL (ref 79–97)
Platelets: 278 10*3/uL (ref 150–450)
RBC: 4.39 x10E6/uL (ref 3.77–5.28)
RDW: 12.7 % (ref 11.7–15.4)
WBC: 6.9 10*3/uL (ref 3.4–10.8)

## 2019-07-24 LAB — LIPID PANEL WITH LDL/HDL RATIO
Cholesterol, Total: 167 mg/dL (ref 100–199)
HDL: 57 mg/dL (ref >39)
LDL Calculated: 86 mg/dL (ref 0–99)
LDl/HDL Ratio: 1.5 ratio (ref 0.0–3.2)
Triglycerides: 121 mg/dL (ref 0–149)
VLDL Cholesterol Cal: 24 mg/dL (ref 5–40)

## 2019-07-24 NOTE — Progress Notes (Signed)
LMOM with details

## 2019-08-01 DIAGNOSIS — M79672 Pain in left foot: Secondary | ICD-10-CM | POA: Diagnosis not present

## 2019-08-02 DIAGNOSIS — I119 Hypertensive heart disease without heart failure: Secondary | ICD-10-CM | POA: Insufficient documentation

## 2019-08-02 DIAGNOSIS — K644 Residual hemorrhoidal skin tags: Secondary | ICD-10-CM | POA: Insufficient documentation

## 2019-08-02 DIAGNOSIS — R5383 Other fatigue: Secondary | ICD-10-CM | POA: Diagnosis not present

## 2019-08-02 DIAGNOSIS — K625 Hemorrhage of anus and rectum: Secondary | ICD-10-CM | POA: Diagnosis not present

## 2019-08-02 DIAGNOSIS — I2510 Atherosclerotic heart disease of native coronary artery without angina pectoris: Secondary | ICD-10-CM | POA: Diagnosis not present

## 2019-08-06 ENCOUNTER — Other Ambulatory Visit: Payer: Self-pay

## 2019-08-06 MED ORDER — CLOPIDOGREL BISULFATE 75 MG PO TABS: 75.0000 mg | ORAL_TABLET | Freq: Every day | ORAL | 2 refills | Status: DC

## 2019-08-06 MED ORDER — METOPROLOL SUCCINATE ER 25 MG PO TB24: 12.5000 mg | ORAL_TABLET | Freq: Every day | ORAL | 1 refills | Status: DC

## 2019-08-09 DIAGNOSIS — M19072 Primary osteoarthritis, left ankle and foot: Secondary | ICD-10-CM | POA: Diagnosis not present

## 2019-08-15 ENCOUNTER — Other Ambulatory Visit: Payer: Self-pay | Admitting: Cardiology

## 2019-08-20 DIAGNOSIS — M25561 Pain in right knee: Secondary | ICD-10-CM | POA: Diagnosis not present

## 2019-08-29 DIAGNOSIS — F430 Acute stress reaction: Secondary | ICD-10-CM | POA: Diagnosis not present

## 2019-08-29 DIAGNOSIS — I119 Hypertensive heart disease without heart failure: Secondary | ICD-10-CM | POA: Diagnosis not present

## 2019-09-03 ENCOUNTER — Ambulatory Visit: Payer: Medicare Other | Admitting: Cardiology

## 2019-09-11 DIAGNOSIS — L218 Other seborrheic dermatitis: Secondary | ICD-10-CM | POA: Diagnosis not present

## 2019-09-11 DIAGNOSIS — L578 Other skin changes due to chronic exposure to nonionizing radiation: Secondary | ICD-10-CM | POA: Diagnosis not present

## 2019-09-11 DIAGNOSIS — L821 Other seborrheic keratosis: Secondary | ICD-10-CM | POA: Diagnosis not present

## 2019-09-13 ENCOUNTER — Encounter: Payer: Self-pay | Admitting: Gynecology

## 2019-09-21 ENCOUNTER — Other Ambulatory Visit: Payer: Self-pay

## 2019-09-21 MED ORDER — APIXABAN 5 MG PO TABS: ORAL_TABLET | ORAL | 1 refills | Status: DC

## 2019-10-22 ENCOUNTER — Other Ambulatory Visit: Payer: Self-pay | Admitting: Cardiology

## 2019-10-22 ENCOUNTER — Encounter: Payer: Self-pay | Admitting: Cardiology

## 2019-10-22 ENCOUNTER — Ambulatory Visit (INDEPENDENT_AMBULATORY_CARE_PROVIDER_SITE_OTHER): Payer: Medicare Other | Admitting: Cardiology

## 2019-10-22 ENCOUNTER — Other Ambulatory Visit: Payer: Self-pay

## 2019-10-22 VITALS — BP 150/84 | HR 55 | Temp 98.7°F | Ht 62.0 in | Wt 138.0 lb

## 2019-10-22 DIAGNOSIS — E782 Mixed hyperlipidemia: Secondary | ICD-10-CM | POA: Diagnosis not present

## 2019-10-22 DIAGNOSIS — I10 Essential (primary) hypertension: Secondary | ICD-10-CM | POA: Diagnosis not present

## 2019-10-22 DIAGNOSIS — I480 Paroxysmal atrial fibrillation: Secondary | ICD-10-CM | POA: Diagnosis not present

## 2019-10-22 DIAGNOSIS — I25118 Atherosclerotic heart disease of native coronary artery with other forms of angina pectoris: Secondary | ICD-10-CM | POA: Diagnosis not present

## 2019-10-22 MED ORDER — REPATHA 140 MG/ML ~~LOC~~ SOSY: 1.0000 | PREFILLED_SYRINGE | SUBCUTANEOUS | 3 refills | Status: DC

## 2019-10-22 MED ORDER — LISINOPRIL-HYDROCHLOROTHIAZIDE 20-12.5 MG PO TABS: 1.0000 | ORAL_TABLET | Freq: Every morning | ORAL | 1 refills | Status: DC

## 2019-10-22 MED ORDER — METOPROLOL SUCCINATE ER 25 MG PO TB24: 12.5000 mg | ORAL_TABLET | Freq: Every day | ORAL | 2 refills | Status: DC

## 2019-10-22 MED ORDER — EVOLOCUMAB 140 MG/ML ~~LOC~~ SOSY
140.0000 mg | PREFILLED_SYRINGE | Freq: Once | SUBCUTANEOUS | Status: AC
  Administered 2019-10-22: 12:00:00 140 mg via SUBCUTANEOUS

## 2019-10-22 MED ORDER — EVOLOCUMAB 140 MG/ML ~~LOC~~ SOAJ: 140.0000 mg | Freq: Once | SUBCUTANEOUS | Status: DC

## 2019-10-22 NOTE — Progress Notes (Signed)
Primary Physician/Referring:  Prince Solian, MD  Patient ID: Erica Frank, female    DOB: 1940-12-19, 78 y.o.   MRN: [id number]  Chief Complaint  Patient presents with  . Atrial Fibrillation  . Hypertension  . Follow-up    68mo . Coronary Artery Disease    HPI: NBrynnlie Frank is a 78y.o. female  with CAD and balloon angioplasty in [place] hypertension, hyperlipidemia, and paroxysmal atrial fibrillation on 01/25/2019 S/P cardioversion in the ED, NSTEMI on 07/09/2019, underwent coronary angiography and angioplasty and stenting to the mid LAD.  She was also seen one day postdischarge in the ED for chest pain, discharged home.  She now presents here for follow-up of coronary artery disease, hyperlipidemia and hypertension.  On her last office visit was suspicion for coronary spasm, had started her on Imdur which she never started.  States that she is doing well.  She has not had any recurrence of chest pain.   Past Medical History:  Diagnosis Date  . Anxiety   . Arthritis   . Atherosclerosis   . Atrial fibrillation ([facility]   . Coronary artery disease   . Heart disease   . High cholesterol   . Hypertension     Past Surgical History:  Procedure Laterality Date  . ABDOMINAL HYSTERECTOMY  1983   for endometriosis with [place]  . ANGIOPLASTY    . ANGIOPLASTY    . BACK SURGERY    . CARDIAC SURGERY     Catheterization  . CORONARY STENT INTERVENTION N/A 07/09/2019   Procedure: CORONARY STENT INTERVENTION;  Surgeon: GAdrian Prows MD;  Location: [facility];  Service: Cardiovascular;  Laterality: N/A;  . LEFT HEART CATH AND CORONARY ANGIOGRAPHY N/A 07/09/2019   Procedure: LEFT HEART CATH AND CORONARY ANGIOGRAPHY and possible intervention;  Surgeon: GAdrian Prows MD;  [facility];  Service: Cardiovascular;  Laterality: N/A;  4: or 4:30 PM today  . NECK SURGERY      Social History   Socioeconomic History  . Marital status: Widowed    Spouse name:  Not on file  . Number of children: 1  . Years of education: Not on file  . Highest education level: Not on file  Occupational History  . Not on file  Social Needs  . Financial resource strain: Not on file  . Food insecurity    Worry: Not on file    Inability: Not on file  . Transportation needs    Medical: Not on file    Non-medical: Not on file  Tobacco Use  . Smoking status: Never Smoker  . Smokeless tobacco: Never Used  Substance and Sexual Activity  . Alcohol use: Yes    Comment: Occasional  . Drug use: No  . Sexual activity: Yes    Birth control/protection: Post-menopausal    Comment: 1st intercourse [id number] partners  Lifestyle  . Physical activity    Days per week: Not on file    Minutes per session: Not on file  . Stress: Not on file  Relationships  . Social [place] phone: Not on file    Gets together: Not on file    Attends religious service: Not on file    Active member of club or organization: Not on file    Attends meetings of clubs or organizations: Not on file    Relationship status: Not on file  . Intimate partner violence    Fear  of current or ex partner: Not on file    Emotionally abused: Not on file    Physically abused: Not on file    Forced sexual activity: Not on file  Other Topics Concern  . Not on file  Social History Narrative  . Not on file   Review of Systems  Constitution: Negative for chills, decreased appetite, malaise/fatigue and weight gain.  Cardiovascular: Positive for chest pain. Negative for dyspnea on exertion, leg swelling and syncope.  Endocrine: Negative for cold intolerance.  Hematologic/Lymphatic: Does not bruise/bleed easily.  Musculoskeletal: Positive for joint pain. Negative for joint swelling.  Gastrointestinal: Negative for abdominal pain, anorexia, change in bowel habit, hematochezia and melena.  Neurological: Negative for headaches and light-headedness.  Psychiatric/Behavioral: Negative for  depression and substance abuse.  All other systems reviewed and are negative.   Objective  Blood pressure (!) 150/84, pulse (!) 55, temperature 98.7 F (37.1 C), height _0  (1.575 m), weight 138 lb (62.6 kg), SpO2 97 %. Body mass index is 25.24 kg/m.    Physical Exam  Constitutional: No distress.  Moderately built and nurished  HENT:  Head: Atraumatic.  Eyes: Conjunctivae are normal.  Neck: Neck supple. No JVD present. No thyromegaly present.  Cardiovascular: Normal rate, regular rhythm, normal heart sounds and intact distal pulses. Exam reveals no gallop.  No murmur heard. Pulmonary/Chest: Effort normal and breath sounds normal.  Abdominal: Soft. Bowel sounds are normal.  Musculoskeletal: Normal range of motion.  Neurological: She is alert.  Skin: Skin is warm and dry.  Psychiatric: She has a normal mood and affect.   Radiology: No results found.  Laboratory examination:   11/03/2018:   Glucose 97, BUN/Cr 25/0.9, eGFR 60.9/73.7, Na/K 141/4.6, AST 22, ALT 19, Alk Phos 55, Bil Tot 0.6, Apo B 63. Rest of CMP normal.  Chol 155, Trig 63, HDL 71, LDL 71. TSH Normal.   CMP Latest Ref Rng & Units 07/23/2019 07/16/2019 07/10/2019  Glucose 65 - 99 mg/dL 88 114(H) 109(H)  BUN 8 - 27 mg/dL _1 Creatinine 0.57 - 1.00 mg/dL 1.00 1.13(H) 0.93  Sodium 134 - 144 mmol/L 139 139 140  Potassium 3.5 - 5.2 mmol/L 4.7 4.5 3.7  Chloride 96 - 106 mmol/L 102 104 108  CO2 20 - 29 mmol/L _2 Calcium 8.7 - 10.3 mg/dL 9.7 9.3 8.9  Total Protein 6.0 - 8.5 g/dL 6.6 6.3(L) -  Total Bilirubin 0.0 - 1.2 mg/dL 0.3 0.7 -  Alkaline Phos 39 - 117 IU/L 63 58 -  AST 0 - 40 IU/L 18 25 -  ALT 0 - 32 IU/L 19 22 -   CBC Latest Ref Rng & Units 07/23/2019 07/16/2019 07/10/2019  WBC 3.4 - 10.8 x10E3/uL 6.9 7.8 7.3  Hemoglobin 11.1 - 15.9 g/dL 13.7 13.4 13.3  Hematocrit 34.0 - 46.6 % 41.2 40.9 40.1  Platelets 150 - 450 x10E3/uL 278 271 266   Lipid Panel     Component Value Date/Time   CHOL 167  07/23/2019 0942   TRIG 121 07/23/2019 0942   HDL 57 07/23/2019 0942   LDLCALC 86 07/23/2019 0942   HEMOGLOBIN A1C No results found for: HGBA1C, MPG TSH Recent Labs    01/30/19 1611  TSH 1.574    PRN Meds:. Medications Discontinued During This Encounter  Medication Reason  . lisinopril (ZESTRIL) 10 MG tablet Change in therapy  . metoprolol succinate (TOPROL-XL) 25 MG 24 hr tablet Reorder  . Evolocumab SOAJ 140 mg  Current Meds  Medication Sig  . acetaminophen (TYLENOL) 500 MG tablet Take 500 mg by mouth. Up to 4 a day  . apixaban (ELIQUIS) 5 MG TABS tablet TAKE 1 TABLET(5 MG) BY MOUTH TWICE DAILY  . BIOTIN PO Take 1 tablet by mouth at bedtime.  . Calcium Carbonate-Vit D-Min (CALCIUM 1200 PO) Take 1 tablet by mouth daily as needed (when thinks about it).   . clopidogrel (PLAVIX) 75 MG tablet Take 1 tablet (75 mg total) by mouth daily.  Marland Kitchen co-enzyme Q-10 30 MG capsule Take 30 mg by mouth once a week.   . Cyanocobalamin (VITAMIN B 12 PO) Take 1 tablet by mouth once a week.   . ezetimibe-simvastatin (VYTORIN) 10-40 MG tablet TAKE 1 TABLET BY MOUTH DAILY  . metoprolol succinate (TOPROL-XL) 25 MG 24 hr tablet Take 0.5 tablets (12.5 mg total) by mouth daily.  . Multiple Vitamins-Minerals (MULTIVITAMIN ADULT PO) Take 1 tablet by mouth once a week.   . nitroGLYCERIN (NITROSTAT) 0.4 MG SL tablet Place 1 tablet (0.4 mg total) under the tongue every 5 (five) minutes as needed for chest pain.  . TURMERIC PO Take 1 tablet by mouth once a week.   . zolpidem (AMBIEN) 5 MG tablet Take 5 mg by mouth as needed.   . [DISCONTINUED] lisinopril (ZESTRIL) 10 MG tablet Take 1 tablet (10 mg total) by mouth daily.  . [DISCONTINUED] metoprolol succinate (TOPROL-XL) 25 MG 24 hr tablet Take 0.5 tablets (12.5 mg total) by mouth daily.    Cardiac Studies:   Echocardiogram 04/23/2019: Normal LV systolic function with EF 59%. Left ventricle cavity is normal in size. Normal global wall motion. Calculated EF  59%. Moderate (Grade II) mitral regurgitation. E-wave dominant mitral inflow. Mild tricuspid regurgitation. No evidence of pulmonary hypertension.  Lexiscan myoview stress test 02/26/2019:  1. Lexiscan stress test was performed. Exercise capacity was not assessed. No stress symptoms reported. Resting blood pressure was 110/70 mmHg and peak effect blood pressure was 146/60 mmHg. The resting and stress electrocardiogram demonstrated normal sinus rhythm, normal  conduction, occasional PAC, and normal repolarization.   Stress EKG is non diagnostic for ischemia as it is a pharmacologic stress.  2. The overall quality of the study is good. There is no evidence of abnormal lung activity. Stress and rest SPECT images demonstrate homogeneous tracer distribution throughout the myocardium. Gated SPECT imaging reveals normal myocardial thickening and wall motion. The left ventricular ejection fraction was normal (52%).   3. Low risk study.  Coronary Angiography 07/09/2019: Moderate amount of diffuse coronary calcification noted in all coronary vessels including left main. Right coronary artery and ramus intermediate showed mid 30% stenosis. Mid LAD had a focal moderately calcified high-grade 95% stenosis S/P STENT SYNERGY DES 3X24. Maximum pressure: 16 atm. Inflation time: 75 sec. Stent strut is well apposed. There was residual 0% stenosis with maintenance of TIMI-3 TIMI-3 flow  Coronary Angiography & Balloon Angioplasty of LAD in 1989 and 1990   DCCV 01/25/2019: 120J x 1 to NSR.  Assessment     ICD-10-CM   1. Paroxysmal atrial fibrillation (HCC)  I48.0 EKG 12-Lead  2. Coronary artery disease of native artery of native heart with stable angina pectoris (HCC)  I25.118 metoprolol succinate (TOPROL-XL) 25 MG 24 hr tablet    Evolocumab (REPATHA) 140 MG/ML SOSY  3. Mixed hyperlipidemia  E78.2 Lipid Panel With LDL/HDL Ratio    DISCONTINUED: Evolocumab SOAJ 140 mg  4. Essential hypertension  I10 Evolocumab  SOSY 140 mg  Basic Metabolic Panel (BMET)    DISCONTINUED: lisinopril-hydrochlorothiazide (ZESTORETIC) 20-12.5 MG tablet    EKG 10/22/2019: Normal sinus rhythm at the rate of 57 bpm, leftward axis, nonspecific T abnormality.  PAC.  No significant change from EKG 07/16/2019, EKG 02/15/2019.  Recommendations:   Ms. Shirleen Mcfaul  is a 78 y.o.  Caucasian female  with CAD and balloon angioplasty in 1989 and 1990, hypertension, hyperlipidemia, and paroxysmal atrial fibrillation on 01/25/2019 S/P cardioversion in the ED, NSTEMI on 07/09/2019, underwent coronary angiography and angioplasty and stenting to the mid LAD.  She was also seen one day postdischarge in the ED for chest pain, discharged home. Her last office visit that started her on Imdur for episode of angina pectoris, which she has not started.  States that she has not had any recurrence of chest pain.  I reviewed her labs, lipids still are not at goal.  I would like to start her on Repatha 140 mg subcutaneous every 14 days, patient has history of gout and hence I cannot use Bempidoic acid and she has not been able to tolerate any other statins due to severe myalgias.  With regard to hypertension, will discontinue lisinopril and switching to lisinopril HCT, lower using greater than 12.5 mg of HCTZ in view of history of gout.  His blood pressure is not well controlled, she may benefit from being on amlodipine.  I'd like to see him back in 6 weeks, I will repeat lipids in [redacted] weeks along with BMP.  For now continue Eliquis along with Plavix, after 6 months after angioplasty on 07/09/2019, she can be on Eliquis along to decrease risk of bleeding.  Adrian Prows, MD, [facility] 10/22/2019, 9:55 PM [facility]. [place] Pager: [phone] Office: [phone] If no answer Cell [phone]

## 2019-10-24 ENCOUNTER — Telehealth: Payer: Self-pay | Admitting: *Deleted

## 2019-10-24 NOTE — Telephone Encounter (Signed)
Patient called and left message asking how often pap smear are needed. I left detailed message on cell and asked patient to call if questions.

## 2019-10-25 ENCOUNTER — Other Ambulatory Visit: Payer: Self-pay

## 2019-10-25 DIAGNOSIS — I25118 Atherosclerotic heart disease of native coronary artery with other forms of angina pectoris: Secondary | ICD-10-CM

## 2019-10-25 MED ORDER — METOPROLOL SUCCINATE ER 25 MG PO TB24: 12.5000 mg | ORAL_TABLET | Freq: Every day | ORAL | 2 refills | Status: DC

## 2019-11-01 ENCOUNTER — Other Ambulatory Visit: Payer: Self-pay | Admitting: Cardiology

## 2019-11-06 NOTE — Telephone Encounter (Signed)
patient has not been seen in 3 years, called asking if she needs to continue annual visits will be 78 next month, I explained yes to continue annual visit. Transferred to appointment's to schedule.

## 2019-11-08 ENCOUNTER — Telehealth: Payer: Self-pay

## 2019-11-08 NOTE — Telephone Encounter (Signed)
Called pt awaitng a call back

## 2019-11-08 NOTE — Telephone Encounter (Signed)
Pt aware.

## 2019-11-08 NOTE — Telephone Encounter (Signed)
This is a Erica Frank patient, Im asking you because she wants a response quickly shes worried; Pt wants to let you know with new bp medication her bp was 102/62 this morning and she wants to know when should she be concerned when its too low?

## 2019-11-08 NOTE — Telephone Encounter (Signed)
That blood pressure looks great to me, especially if she is feeling okay. I would get concerned in the mid to low 34'Z systolic (top number) especially if dizzy or lightheaded. If she has a reading of 95 and is feeling okay, would not be to concerned. Would just be sure she is staying well hydrated. If she does have a low reading and is a little lightheaded, would push fluids with water and pedialyte and lay down for a little while.

## 2019-11-09 ENCOUNTER — Other Ambulatory Visit: Payer: Self-pay | Admitting: Internal Medicine

## 2019-11-09 ENCOUNTER — Other Ambulatory Visit: Payer: Self-pay

## 2019-11-09 ENCOUNTER — Ambulatory Visit
Admission: RE | Admit: 2019-11-09 | Discharge: 2019-11-09 | Disposition: A | Discharge status: Home / Self Care | Payer: Medicare Other | Source: Ambulatory Visit | Attending: Internal Medicine | Admitting: Internal Medicine

## 2019-11-09 DIAGNOSIS — I119 Hypertensive heart disease without heart failure: Secondary | ICD-10-CM | POA: Diagnosis not present

## 2019-11-09 DIAGNOSIS — M542 Cervicalgia: Secondary | ICD-10-CM | POA: Insufficient documentation

## 2019-11-09 DIAGNOSIS — R519 Headache, unspecified: Secondary | ICD-10-CM

## 2019-11-15 DIAGNOSIS — M170 Bilateral primary osteoarthritis of knee: Secondary | ICD-10-CM | POA: Diagnosis not present

## 2019-11-15 DIAGNOSIS — M1712 Unilateral primary osteoarthritis, left knee: Secondary | ICD-10-CM | POA: Diagnosis not present

## 2019-11-15 DIAGNOSIS — M1711 Unilateral primary osteoarthritis, right knee: Secondary | ICD-10-CM | POA: Diagnosis not present

## 2019-11-19 ENCOUNTER — Other Ambulatory Visit: Payer: Self-pay

## 2019-11-19 DIAGNOSIS — I10 Essential (primary) hypertension: Secondary | ICD-10-CM

## 2019-11-19 MED ORDER — LISINOPRIL-HYDROCHLOROTHIAZIDE 20-12.5 MG PO TABS: 1.0000 | ORAL_TABLET | Freq: Every morning | ORAL | 1 refills | Status: DC

## 2019-11-20 DIAGNOSIS — E782 Mixed hyperlipidemia: Secondary | ICD-10-CM | POA: Diagnosis not present

## 2019-11-20 DIAGNOSIS — I10 Essential (primary) hypertension: Secondary | ICD-10-CM | POA: Diagnosis not present

## 2019-11-21 LAB — BASIC METABOLIC PANEL
BUN/Creatinine Ratio: 20 (ref 12–28)
BUN: 20 mg/dL (ref 8–27)
CO2: 25 mmol/L (ref 20–29)
Calcium: 9.4 mg/dL (ref 8.7–10.3)
Chloride: 101 mmol/L (ref 96–106)
Creatinine, Ser: 1.01 mg/dL — ABNORMAL HIGH (ref 0.57–1.00)
GFR calc Af Amer: 62 mL/min/{1.73_m2} (ref >59)
GFR calc non Af Amer: 53 mL/min/{1.73_m2} — ABNORMAL LOW (ref >59)
Glucose: 90 mg/dL (ref 65–99)
Potassium: 4.1 mmol/L (ref 3.5–5.2)
Sodium: 140 mmol/L (ref 134–144)

## 2019-11-21 LAB — LIPID PANEL WITH LDL/HDL RATIO
Cholesterol, Total: 119 mg/dL (ref 100–199)
HDL: 61 mg/dL (ref >39)
LDL Chol Calc (NIH): 31 mg/dL (ref 0–99)
LDL/HDL Ratio: 0.5 ratio (ref 0.0–3.2)
Triglycerides: 169 mg/dL — ABNORMAL HIGH (ref 0–149)
VLDL Cholesterol Cal: 27 mg/dL (ref 5–40)

## 2019-11-21 NOTE — Progress Notes (Signed)
Primary Physician/Referring:  Prince Solian, MD  Patient ID: Erica Frank, female    DOB: 1941/01/16, 78 y.o.   MRN: [id number]  Chief Complaint  Patient presents with  . Coronary Artery Disease  . Hypertension  . Hyperlipidemia  . Follow-up    stent placement    HPI: Erica Frank  is a 78 y.o. female  with CAD and balloon angioplasty in [place], hypertension, hyperlipidemia, and paroxysmal atrial fibrillation on 01/25/2019 S/P cardioversion in the ED, NSTEMI on 07/09/2019, underwent coronary angiography and angioplasty and stenting to the mid LAD.  Last seen 6 weeks ago, lisinopril changed to lisinopril HCT for hypertension. She now presents for HTN follow up.  States that she is doing well.  Blood pressure has been ranging from 29-476 systolic.  She is tolerating lisinopril hydrochlorothiazide well. She has not had any recurrence of chest pain.  She does mention episodes of jitteriness midday that improves throughout the day.  No dizziness or near syncope.  She was also recently started on Repatha, states that the cost may be too much for her.  She has had recent lipid panel and is here to discuss results.  Past Medical History:  Diagnosis Date  . Anxiety   . Arthritis   . Atherosclerosis   . Atrial fibrillation ([place])   . Coronary artery disease   . Heart disease   . High cholesterol   . Hypertension     Past Surgical History:  Procedure Laterality Date  . ABDOMINAL HYSTERECTOMY  1983   for endometriosis with [place]  . ANGIOPLASTY    . ANGIOPLASTY    . BACK SURGERY    . CARDIAC SURGERY     Catheterization  . CORONARY STENT INTERVENTION N/A 07/09/2019   Procedure: CORONARY STENT INTERVENTION;  Surgeon: Adrian Prows, MD;  Location: [facility];  Service: Cardiovascular;  Laterality: N/A;  . LEFT HEART CATH AND CORONARY ANGIOGRAPHY N/A 07/09/2019   Procedure: LEFT HEART CATH AND CORONARY ANGIOGRAPHY and possible intervention;  Surgeon: Adrian Prows, MD;   Location: [facility];  Service: Cardiovascular;  Laterality: N/A;  4: or 4:30 PM today  . NECK SURGERY      Social History   Socioeconomic History  . Marital status: Widowed    Spouse name: Not on file  . Number of children: 1  . Years of education: Not on file  . Highest education level: Not on file  Occupational History  . Not on file  Tobacco Use  . Smoking status: Never Smoker  . Smokeless tobacco: Never Used  Substance and Sexual Activity  . Alcohol use: Yes    Comment: Occasional  . Drug use: No  . Sexual activity: Yes    Birth control/protection: Post-menopausal    Comment: 1st intercourse 56 yo-1 partners  Other Topics Concern  . Not on file  Social History Narrative  . Not on file   Social Determinants of Health   Financial Resource Strain:   . Difficulty of Paying Living Expenses: Not on file  Food Insecurity:   . Worried About Charity fundraiser in the Last Year: Not on file  . Ran Out of Food in the Last Year: Not on file  Transportation Needs:   . Lack of Transportation (Medical): Not on file  . Lack of Transportation (Non-Medical): Not on file  Physical Activity:   . Days of Exercise per Week: Not on file  . Minutes of Exercise per Session: Not  on file  Stress:   . Feeling of Stress : Not on file  Social Connections:   . Frequency of Communication with Friends and Family: Not on file  . Frequency of Social Gatherings with Friends and Family: Not on file  . Attends Religious Services: Not on file  . Active Member of Clubs or Organizations: Not on file  . Attends Archivist Meetings: Not on file  . Marital Status: Not on file  Intimate Partner Violence:   . Fear of Current or Ex-Partner: Not on file  . Emotionally Abused: Not on file  . Physically Abused: Not on file  . Sexually Abused: Not on file   Review of Systems  Constitution: Negative for chills, decreased appetite, malaise/fatigue and weight gain.  Cardiovascular:  Positive for chest pain. Negative for dyspnea on exertion, leg swelling and syncope.  Endocrine: Negative for cold intolerance.  Hematologic/Lymphatic: Does not bruise/bleed easily.  Musculoskeletal: Positive for joint pain. Negative for joint swelling.  Gastrointestinal: Negative for abdominal pain, anorexia, change in bowel habit, hematochezia and melena.  Neurological: Negative for headaches and light-headedness.  Psychiatric/Behavioral: Negative for depression and substance abuse.  All other systems reviewed and are negative.   Objective  Blood pressure 134/68, pulse (!) 49, temperature 98.2 F (36.8 C), height _0  (1.575 m), weight 135 lb (61.2 kg), SpO2 97 %. Body mass index is 24.69 kg/m.    Physical Exam  Constitutional: No distress.  Moderately built and nurished  HENT:  Head: Atraumatic.  Eyes: Conjunctivae are normal.  Neck: Neck supple. No JVD present. No thyromegaly present.  Cardiovascular: Normal rate, regular rhythm, normal heart sounds and intact distal pulses. Exam reveals no gallop.  No murmur heard. Pulmonary/Chest: Effort normal and breath sounds normal.  Abdominal: Soft. Bowel sounds are normal.  Musculoskeletal: Normal range of motion.  Neurological: She is alert.  Skin: Skin is warm and dry.  Psychiatric: She has a normal mood and affect.   Radiology: No results found.  Laboratory examination:   11/03/2018:   Glucose 97, BUN/Cr 25/0.9, eGFR 60.9/73.7, Na/K 141/4.6, AST 22, ALT 19, Alk Phos 55, Bil Tot 0.6, Apo B 63. Rest of CMP normal.  Chol 155, Trig 63, HDL 71, LDL 71. TSH Normal.   CMP Latest Ref Rng & Units 11/20/2019 07/23/2019 07/16/2019  Glucose 65 - 99 mg/dL 90 88 114(H)  BUN 8 - 27 mg/dL _1 Creatinine 0.57 - 1.00 mg/dL 1.01(H) 1.00 1.13(H)  Sodium 134 - 144 mmol/L 140 139 139  Potassium 3.5 - 5.2 mmol/L 4.1 4.7 4.5  Chloride 96 - 106 mmol/L 101 102 104  CO2 20 - 29 mmol/L _2 Calcium 8.7 - 10.3 mg/dL 9.4 9.7 9.3  Total  Protein 6.0 - 8.5 g/dL - 6.6 6.3(L)  Total Bilirubin 0.0 - 1.2 mg/dL - 0.3 0.7  Alkaline Phos 39 - 117 IU/L - 63 58  AST 0 - 40 IU/L - 18 25  ALT 0 - 32 IU/L - 19 22   CBC Latest Ref Rng & Units 07/23/2019 07/16/2019 07/10/2019  WBC 3.4 - 10.8 x10E3/uL 6.9 7.8 7.3  Hemoglobin 11.1 - 15.9 g/dL 13.7 13.4 13.3  Hematocrit 34.0 - 46.6 % 41.2 40.9 40.1  Platelets 150 - 450 x10E3/uL 278 271 266   Lipid Panel     Component Value Date/Time   CHOL 119 11/20/2019 0920   TRIG 169 (H) 11/20/2019 0920   HDL 61 11/20/2019 0920   LDLCALC 31 11/20/2019  0920   HEMOGLOBIN A1C No results found for: HGBA1C, MPG TSH Recent Labs    01/30/19 1611  TSH 1.574    PRN Meds:. Medications Discontinued During This Encounter  Medication Reason  . isosorbide mononitrate (IMDUR) 60 MG 24 hr tablet Error   Current Meds  Medication Sig  . acetaminophen (TYLENOL) 500 MG tablet Take 500 mg by mouth. Up to 4 a day  . apixaban (ELIQUIS) 5 MG TABS tablet TAKE 1 TABLET(5 MG) BY MOUTH TWICE DAILY  . BIOTIN PO Take 1 tablet by mouth at bedtime.  . Calcium Carbonate-Vit D-Min (CALCIUM 1200 PO) Take 1 tablet by mouth daily as needed (when thinks about it).   . clopidogrel (PLAVIX) 75 MG tablet TAKE 1 TABLET(75 MG) BY MOUTH DAILY  . co-enzyme Q-10 30 MG capsule Take 30 mg by mouth once a week.   . Cyanocobalamin (VITAMIN B 12 PO) Take 1 tablet by mouth once a week.   . Evolocumab (REPATHA) 140 MG/ML SOSY Inject 1 Syringe into the skin every 14 (fourteen) days.  Marland Kitchen ezetimibe-simvastatin (VYTORIN) 10-40 MG tablet TAKE 1 TABLET BY MOUTH DAILY  . lisinopril-hydrochlorothiazide (ZESTORETIC) 20-12.5 MG tablet Take 1 tablet by mouth every morning.  . metoprolol succinate (TOPROL-XL) 25 MG 24 hr tablet Take 0.5 tablets (12.5 mg total) by mouth daily.  . Multiple Vitamins-Minerals (MULTIVITAMIN ADULT PO) Take 1 tablet by mouth once a week.   . nitroGLYCERIN (NITROSTAT) 0.4 MG SL tablet Place 1 tablet (0.4 mg total) under the  tongue every 5 (five) minutes as needed for chest pain.  . TURMERIC PO Take 1 tablet by mouth once a week.   . zolpidem (AMBIEN) 5 MG tablet Take 5 mg by mouth as needed.     Cardiac Studies:   Echocardiogram 04/23/2019: Normal LV systolic function with EF 59%. Left ventricle cavity is normal in size. Normal global wall motion. Calculated EF 59%. Moderate (Grade II) mitral regurgitation. E-wave dominant mitral inflow. Mild tricuspid regurgitation. No evidence of pulmonary hypertension.  Lexiscan myoview stress test 02/26/2019:  1. Lexiscan stress test was performed. Exercise capacity was not assessed. No stress symptoms reported. Resting blood pressure was 110/70 mmHg and peak effect blood pressure was 146/60 mmHg. The resting and stress electrocardiogram demonstrated normal sinus rhythm, normal  conduction, occasional PAC, and normal repolarization.   Stress EKG is non diagnostic for ischemia as it is a pharmacologic stress.  2. The overall quality of the study is good. There is no evidence of abnormal lung activity. Stress and rest SPECT images demonstrate homogeneous tracer distribution throughout the myocardium. Gated SPECT imaging reveals normal myocardial thickening and wall motion. The left ventricular ejection fraction was normal (52%).   3. Low risk study.  Coronary Angiography 07/09/2019: Moderate amount of diffuse coronary calcification noted in all coronary vessels including left main. Right coronary artery and ramus intermediate showed mid 30% stenosis. Mid LAD had a focal moderately calcified high-grade 95% stenosis S/P STENT SYNERGY DES 3X24. Maximum pressure: 16 atm. Inflation time: 75 sec. Stent strut is well apposed. There was residual 0% stenosis with maintenance of TIMI-3 TIMI-3 flow  Coronary Angiography & Balloon Angioplasty of LAD in 1989 and 1990   DCCV 01/25/2019: 120J x 1 to NSR.  Assessment     ICD-10-CM   1. Essential hypertension  I10 EKG 12-Lead  2.  Coronary artery disease of native artery of native heart with stable angina pectoris ([place])  I25.118   3. Paroxysmal atrial fibrillation ([facility])  I48.0  4. Mixed hyperlipidemia  E78.2     EKG 10/22/2019: Normal sinus rhythm at the rate of 57 bpm, leftward axis, nonspecific T abnormality.  PAC.  No significant change from EKG 07/16/2019, EKG 02/15/2019.  EKG 11/22/2019: Marked sinus bradycardia at 47 bpm, normal axis axis, nonspecific T abnormality.    Recommendations:   Ms. Britta Louth  is a 78 y.o.  Caucasian female  with CAD and balloon angioplasty in 1989 and 1990, hypertension, hyperlipidemia, and paroxysmal atrial fibrillation on 01/25/2019 S/P cardioversion in the ED, NSTEMI on 07/09/2019, underwent coronary angiography and angioplasty and stenting to the mid LAD.  She is doing well since last seen by [place] 6 weeks ago, no symptoms of angina.  Her blood pressure has significantly improved with lisinopril hydrochlorothiazide.  No recurrence of gout with hydrochlorothiazide.  Would recommend continued low-dose in view of her gout history.  Unsure of etiology for her jitteriness, will continue to follow.  She is noted to be markedly bradycardic, but is asymptomatic in regards to this and in view of her CAD history, will continue with 12.5 mg of Toprol.  As she has been approximately 6 months since angioplasty, we will discontinue Plavix and continue with Eliquis alone to decrease her risk of bleeding.  She has not had any known recurrence of atrial fibrillation.  I discussed recently obtained lipid panel, lipids have significantly improved with the addition of Repatha and she is tolerating well.  She is concerned about cost, hopefully this will improve after the first of the year.  Could consider patient assistance programs as well.  She will notify me of the cost changes after the first of the year.  Continue with simvastatin and Zetia combination as well.  We have discussed diet modifications to  also help with lipid control.  We will see her back in 6 months, but encouraged her to contact us sooner if needed.   *I have discussed this case with Dr. Einar Gip and he personally examined the patient and participated in formulating the plan.*   Miquel Dunn, MSN, APRN, FNP-C [facility]. [place] Office: [phone] Fax: [phone]

## 2019-11-22 ENCOUNTER — Encounter: Payer: Self-pay | Admitting: Cardiology

## 2019-11-22 ENCOUNTER — Ambulatory Visit (INDEPENDENT_AMBULATORY_CARE_PROVIDER_SITE_OTHER): Payer: Medicare Other | Admitting: Cardiology

## 2019-11-22 ENCOUNTER — Other Ambulatory Visit: Payer: Self-pay

## 2019-11-22 VITALS — BP 134/68 | HR 49 | Temp 98.2°F | Ht 62.0 in | Wt 135.0 lb

## 2019-11-22 DIAGNOSIS — I25118 Atherosclerotic heart disease of native coronary artery with other forms of angina pectoris: Secondary | ICD-10-CM | POA: Diagnosis not present

## 2019-11-22 DIAGNOSIS — I10 Essential (primary) hypertension: Secondary | ICD-10-CM

## 2019-11-22 DIAGNOSIS — I480 Paroxysmal atrial fibrillation: Secondary | ICD-10-CM | POA: Diagnosis not present

## 2019-11-22 DIAGNOSIS — E782 Mixed hyperlipidemia: Secondary | ICD-10-CM | POA: Diagnosis not present

## 2019-11-23 DIAGNOSIS — G959 Disease of spinal cord, unspecified: Secondary | ICD-10-CM | POA: Diagnosis not present

## 2019-11-23 DIAGNOSIS — E7849 Other hyperlipidemia: Secondary | ICD-10-CM | POA: Diagnosis not present

## 2019-11-23 DIAGNOSIS — R519 Headache, unspecified: Secondary | ICD-10-CM | POA: Diagnosis not present

## 2019-11-26 DIAGNOSIS — R82998 Other abnormal findings in urine: Secondary | ICD-10-CM | POA: Diagnosis not present

## 2019-11-26 DIAGNOSIS — I10 Essential (primary) hypertension: Secondary | ICD-10-CM | POA: Diagnosis not present

## 2019-11-28 DIAGNOSIS — M109 Gout, unspecified: Secondary | ICD-10-CM | POA: Insufficient documentation

## 2019-11-28 DIAGNOSIS — I4891 Unspecified atrial fibrillation: Secondary | ICD-10-CM | POA: Diagnosis not present

## 2019-11-28 DIAGNOSIS — I252 Old myocardial infarction: Secondary | ICD-10-CM | POA: Diagnosis not present

## 2019-11-28 DIAGNOSIS — Z1389 Encounter for screening for other disorder: Secondary | ICD-10-CM | POA: Diagnosis not present

## 2019-11-28 DIAGNOSIS — Z0000 Encounter for general adult medical examination without abnormal findings: Secondary | ICD-10-CM | POA: Diagnosis not present

## 2019-11-28 DIAGNOSIS — K921 Melena: Secondary | ICD-10-CM | POA: Diagnosis not present

## 2019-11-28 DIAGNOSIS — I119 Hypertensive heart disease without heart failure: Secondary | ICD-10-CM | POA: Diagnosis not present

## 2019-12-03 ENCOUNTER — Ambulatory Visit: Payer: Medicare Other | Admitting: Cardiology

## 2019-12-04 ENCOUNTER — Ambulatory Visit: Payer: Medicare Other | Admitting: Gynecology

## 2019-12-04 DIAGNOSIS — M542 Cervicalgia: Secondary | ICD-10-CM | POA: Diagnosis not present

## 2019-12-04 DIAGNOSIS — R195 Other fecal abnormalities: Secondary | ICD-10-CM | POA: Insufficient documentation

## 2019-12-04 DIAGNOSIS — G959 Disease of spinal cord, unspecified: Secondary | ICD-10-CM | POA: Diagnosis not present

## 2019-12-19 DIAGNOSIS — G959 Disease of spinal cord, unspecified: Secondary | ICD-10-CM | POA: Diagnosis not present

## 2019-12-27 ENCOUNTER — Ambulatory Visit: Payer: Medicare Other | Attending: Internal Medicine

## 2019-12-27 DIAGNOSIS — Z23 Encounter for immunization: Secondary | ICD-10-CM

## 2019-12-27 NOTE — Progress Notes (Signed)
   Covid-19 Vaccination Clinic  Name:  Erica Frank    MRN: [id number] DOB: 1941-09-19  12/27/2019  [name] was observed post Covid-19 immunization for 15 minutes without incidence. She was provided with Vaccine Information Sheet and instruction to access the V-Safe system.   [name] was instructed to call 911 with any severe reactions post vaccine: Marland Kitchen Difficulty breathing  . Swelling of your face and throat  . A fast heartbeat  . A bad rash all over your body  . Dizziness and weakness    Immunizations Administered    Name Date Dose VIS Date Route   Pfizer COVID-19 Vaccine 12/27/2019  3:19 PM 0.3 mL 11/16/2019 Intramuscular   Manufacturer: [employer], [employer]   Lot: [id number]   NDC: [address]-6

## 2020-01-03 ENCOUNTER — Telehealth: Payer: Self-pay

## 2020-01-03 NOTE — Telephone Encounter (Signed)
It could be. We could try Praluent, but similar molecules. Or we can try Nexletol 600 mg daily and continue simvastatina nd zetia combination

## 2020-01-03 NOTE — Telephone Encounter (Signed)
Pt aware but wants to think about it. Not sure if she wants to make changes right away. She will take 1 or 2 more doses and see if symptoms continue to worsen/so she will monitor and let us know in a couple of weeks.//ah

## 2020-01-03 NOTE — Telephone Encounter (Signed)
LMOM to return call//ah

## 2020-01-03 NOTE — Telephone Encounter (Signed)
Pt called stating that ever since starting Repatha, her nails and hair has been thinning. She read that it was a side effect from the med. Please advise.//ah

## 2020-01-04 DIAGNOSIS — R195 Other fecal abnormalities: Secondary | ICD-10-CM | POA: Diagnosis not present

## 2020-01-09 DIAGNOSIS — R195 Other fecal abnormalities: Secondary | ICD-10-CM | POA: Diagnosis not present

## 2020-01-14 ENCOUNTER — Ambulatory Visit: Payer: Medicare Other | Admitting: Obstetrics and Gynecology

## 2020-01-14 ENCOUNTER — Encounter: Payer: Self-pay | Admitting: Obstetrics and Gynecology

## 2020-01-14 ENCOUNTER — Other Ambulatory Visit: Payer: Self-pay

## 2020-01-14 VITALS — BP 104/60 | Ht 60.0 in | Wt 135.0 lb

## 2020-01-14 DIAGNOSIS — Z01419 Encounter for gynecological examination (general) (routine) without abnormal findings: Secondary | ICD-10-CM

## 2020-01-14 NOTE — Progress Notes (Signed)
Erica Frank [facility] July 05, 1941 [id number]  SUBJECTIVE:  79 y.o. G1P1 female for annual routine gynecologic exam.  It had been about 3 years since her last gyn exam. She has no gynecologic concerns.  She did have an MI this past year and had an angioplasty and stenting done.  Current Outpatient Medications  Medication Sig Dispense Refill  . acetaminophen (TYLENOL) 500 MG tablet Take 500 mg by mouth. Up to 4 a day    . apixaban (ELIQUIS) 5 MG TABS tablet TAKE 1 TABLET(5 MG) BY MOUTH TWICE DAILY 180 tablet 1  . BIOTIN PO Take 1 tablet by mouth at bedtime.    . Calcium Carbonate-Vit D-Min (CALCIUM 1200 PO) Take 1 tablet by mouth daily as needed (when thinks about it).     Marland Kitchen co-enzyme Q-10 30 MG capsule Take 30 mg by mouth once a week.     . Cyanocobalamin (VITAMIN B 12 PO) Take 1 tablet by mouth once a week.     . Evolocumab (REPATHA) 140 MG/ML SOSY Inject 1 Syringe into the skin every 14 (fourteen) days. 6 mL 3  . ezetimibe-simvastatin (VYTORIN) 10-40 MG tablet TAKE 1 TABLET BY MOUTH DAILY 90 tablet 1  . lisinopril-hydrochlorothiazide (ZESTORETIC) 20-12.5 MG tablet Take 1 tablet by mouth every morning. 90 tablet 1  . metoprolol succinate (TOPROL-XL) 25 MG 24 hr tablet Take 0.5 tablets (12.5 mg total) by mouth daily. 90 tablet 2  . Multiple Vitamins-Minerals (MULTIVITAMIN ADULT PO) Take 1 tablet by mouth once a week.     . nitroGLYCERIN (NITROSTAT) 0.4 MG SL tablet Place 1 tablet (0.4 mg total) under the tongue every 5 (five) minutes as needed for chest pain. 25 tablet 11  . TURMERIC PO Take 1 tablet by mouth once a week.      No current facility-administered medications for this visit.   Allergies: Patient has no known allergies.  No LMP recorded. Patient has had a hysterectomy.  Past medical history,surgical history, problem list, medications, allergies, family history and social history were all reviewed and documented as reviewed in the EPIC chart.  ROS:  Feeling well. No dyspnea or  chest pain on exertion.  No abdominal pain, change in bowel habits, black or bloody stools.  No urinary tract symptoms. GYN ROS: no abnormal bleeding, pelvic pain or discharge, no breast pain or new or enlarging lumps on self exam. No neurological complaints.   OBJECTIVE:  BP 104/60   Ht 5' (1.524 m)   Wt 135 lb (61.2 kg)   BMI 26.37 kg/m  The patient appears well, alert, oriented x 3, in no distress. ENT normal.  Neck supple. No cervical or supraclavicular adenopathy or thyromegaly.  Lungs are clear, good air entry, no wheezes, rhonchi or rales. S1 and S2 normal, no murmurs, regular rate and rhythm.  Abdomen soft without tenderness, guarding, mass or organomegaly.  Neurological is normal, no focal findings.  BREAST EXAM: breasts appear normal, no suspicious masses, no skin or nipple changes or axillary nodes  PELVIC EXAM: VULVA: normal appearing vulva with no masses, tenderness or lesions, atrophic changes noted, VAGINA: normal appearing vagina with normal color and discharge, no lesions, CERVIX: normal appearing cervix without discharge or lesions, UTERUS: uterus is normal size, shape, consistency and nontender, ADNEXA: normal adnexa in size, nontender and no masses, RECTAL: normal rectal, no masses  Chaperone: Kennon Portela present during the examination  ASSESSMENT:  79 y.o. G1P1 here for annual gynecologic exam  PLAN:   1. Postmenopausal.  No vasomotor  symptoms nor concerns. 2. Pap smear last in 2009. Not repeated today. She indicates no prior history of abnormal Pap smears. Discussed Pap smear screening past age 22 is not required with her normal Pap smear history, and she is comfortable with not having further screening at this time. 3. Mammogram 05/2019. Will continue with annual mammography. Breast exam normal today. 4. Colonoscopy 2018. Recommended that she continue per the prescribed interval.   5. DEXA 02/2016 showed normal bone density with recommended 5 year follow up in 2022.   She is supplementing with calcium and vitamin D. 6. Health maintenance.  No lab work as she has this completed with her primary care provider who also manages the patient's other medical conditions.  Return annually or sooner, prn.  Joseph Pierini MD  01/14/20

## 2020-01-14 NOTE — Patient Instructions (Signed)
We will plan to see you for your routine annual exam, continue to get mammograms each year

## 2020-01-16 ENCOUNTER — Emergency Department (HOSPITAL_COMMUNITY): Payer: Medicare Other

## 2020-01-16 ENCOUNTER — Encounter (HOSPITAL_COMMUNITY): Payer: Self-pay

## 2020-01-16 ENCOUNTER — Observation Stay (HOSPITAL_COMMUNITY)
Admission: EM | Admit: 2020-01-16 | Discharge: 2020-01-17 | Disposition: A | Discharge status: Home / Self Care | Payer: Medicare Other | Attending: Internal Medicine | Admitting: Internal Medicine

## 2020-01-16 ENCOUNTER — Other Ambulatory Visit: Payer: Self-pay

## 2020-01-16 DIAGNOSIS — Z8639 Personal history of other endocrine, nutritional and metabolic disease: Secondary | ICD-10-CM | POA: Diagnosis not present

## 2020-01-16 DIAGNOSIS — Z79899 Other long term (current) drug therapy: Secondary | ICD-10-CM | POA: Insufficient documentation

## 2020-01-16 DIAGNOSIS — D649 Anemia, unspecified: Secondary | ICD-10-CM

## 2020-01-16 DIAGNOSIS — I2510 Atherosclerotic heart disease of native coronary artery without angina pectoris: Secondary | ICD-10-CM | POA: Diagnosis not present

## 2020-01-16 DIAGNOSIS — M1990 Unspecified osteoarthritis, unspecified site: Secondary | ICD-10-CM | POA: Diagnosis not present

## 2020-01-16 DIAGNOSIS — D696 Thrombocytopenia, unspecified: Secondary | ICD-10-CM | POA: Diagnosis not present

## 2020-01-16 DIAGNOSIS — Z8249 Family history of ischemic heart disease and other diseases of the circulatory system: Secondary | ICD-10-CM | POA: Diagnosis not present

## 2020-01-16 DIAGNOSIS — Z955 Presence of coronary angioplasty implant and graft: Secondary | ICD-10-CM | POA: Diagnosis not present

## 2020-01-16 DIAGNOSIS — I10 Essential (primary) hypertension: Secondary | ICD-10-CM | POA: Diagnosis not present

## 2020-01-16 DIAGNOSIS — I25110 Atherosclerotic heart disease of native coronary artery with unstable angina pectoris: Secondary | ICD-10-CM | POA: Diagnosis not present

## 2020-01-16 DIAGNOSIS — Z803 Family history of malignant neoplasm of breast: Secondary | ICD-10-CM | POA: Insufficient documentation

## 2020-01-16 DIAGNOSIS — Z8049 Family history of malignant neoplasm of other genital organs: Secondary | ICD-10-CM | POA: Diagnosis not present

## 2020-01-16 DIAGNOSIS — I252 Old myocardial infarction: Secondary | ICD-10-CM | POA: Diagnosis not present

## 2020-01-16 DIAGNOSIS — I7090 Unspecified atherosclerosis: Secondary | ICD-10-CM | POA: Insufficient documentation

## 2020-01-16 DIAGNOSIS — E782 Mixed hyperlipidemia: Secondary | ICD-10-CM | POA: Diagnosis not present

## 2020-01-16 DIAGNOSIS — Z90710 Acquired absence of both cervix and uterus: Secondary | ICD-10-CM | POA: Insufficient documentation

## 2020-01-16 DIAGNOSIS — R001 Bradycardia, unspecified: Secondary | ICD-10-CM | POA: Diagnosis not present

## 2020-01-16 DIAGNOSIS — R4020 Unspecified coma: Secondary | ICD-10-CM | POA: Diagnosis not present

## 2020-01-16 DIAGNOSIS — I480 Paroxysmal atrial fibrillation: Secondary | ICD-10-CM | POA: Diagnosis present

## 2020-01-16 DIAGNOSIS — Z7901 Long term (current) use of anticoagulants: Secondary | ICD-10-CM | POA: Insufficient documentation

## 2020-01-16 DIAGNOSIS — I959 Hypotension, unspecified: Secondary | ICD-10-CM | POA: Diagnosis not present

## 2020-01-16 DIAGNOSIS — R55 Syncope and collapse: Secondary | ICD-10-CM | POA: Diagnosis not present

## 2020-01-16 DIAGNOSIS — F419 Anxiety disorder, unspecified: Secondary | ICD-10-CM | POA: Diagnosis not present

## 2020-01-16 DIAGNOSIS — Z20822 Contact with and (suspected) exposure to covid-19: Secondary | ICD-10-CM | POA: Insufficient documentation

## 2020-01-16 DIAGNOSIS — R42 Dizziness and giddiness: Secondary | ICD-10-CM | POA: Diagnosis not present

## 2020-01-16 DIAGNOSIS — E7800 Pure hypercholesterolemia, unspecified: Secondary | ICD-10-CM | POA: Diagnosis not present

## 2020-01-16 DIAGNOSIS — R1110 Vomiting, unspecified: Secondary | ICD-10-CM | POA: Diagnosis not present

## 2020-01-16 LAB — CBC WITH DIFFERENTIAL/PLATELET
Abs Immature Granulocytes: 0.03 10*3/uL (ref 0.00–0.07)
Basophils Absolute: 0 10*3/uL (ref 0.0–0.1)
Basophils Relative: 0 %
Eosinophils Absolute: 0.1 10*3/uL (ref 0.0–0.5)
Eosinophils Relative: 1 %
HCT: 28 % — ABNORMAL LOW (ref 36.0–46.0)
Hemoglobin: 8.5 g/dL — ABNORMAL LOW (ref 12.0–15.0)
Immature Granulocytes: 0 %
Lymphocytes Relative: 17 %
Lymphs Abs: 1.2 10*3/uL (ref 0.7–4.0)
MCH: 31.8 pg (ref 26.0–34.0)
MCHC: 30.4 g/dL (ref 30.0–36.0)
MCV: 104.9 fL — ABNORMAL HIGH (ref 80.0–100.0)
Monocytes Absolute: 0.6 10*3/uL (ref 0.1–1.0)
Monocytes Relative: 8 %
Neutro Abs: 5.1 10*3/uL (ref 1.7–7.7)
Neutrophils Relative %: 74 %
Platelets: 146 10*3/uL — ABNORMAL LOW (ref 150–400)
RBC: 2.67 MIL/uL — ABNORMAL LOW (ref 3.87–5.11)
RDW: 14.2 % (ref 11.5–15.5)
WBC: 7 10*3/uL (ref 4.0–10.5)
nRBC: 0 % (ref 0.0–0.2)

## 2020-01-16 LAB — URINALYSIS, ROUTINE W REFLEX MICROSCOPIC
Bilirubin Urine: NEGATIVE
Glucose, UA: NEGATIVE mg/dL
Hgb urine dipstick: NEGATIVE
Ketones, ur: 5 mg/dL — AB
Leukocytes,Ua: NEGATIVE
Nitrite: NEGATIVE
Protein, ur: NEGATIVE mg/dL
Specific Gravity, Urine: 1.014 (ref 1.005–1.030)
pH: 6 (ref 5.0–8.0)

## 2020-01-16 LAB — COMPREHENSIVE METABOLIC PANEL
ALT: 19 U/L (ref 0–44)
AST: 24 U/L (ref 15–41)
Albumin: 3.4 g/dL — ABNORMAL LOW (ref 3.5–5.0)
Alkaline Phosphatase: 47 U/L (ref 38–126)
Anion gap: 9 (ref 5–15)
BUN: 21 mg/dL (ref 8–23)
CO2: 20 mmol/L — ABNORMAL LOW (ref 22–32)
Calcium: 8.2 mg/dL — ABNORMAL LOW (ref 8.9–10.3)
Chloride: 110 mmol/L (ref 98–111)
Creatinine, Ser: 0.88 mg/dL (ref 0.44–1.00)
GFR calc Af Amer: >60 mL/min (ref >60)
GFR calc non Af Amer: >60 mL/min (ref >60)
Glucose, Bld: 96 mg/dL (ref 70–99)
Potassium: 4.8 mmol/L (ref 3.5–5.1)
Sodium: 139 mmol/L (ref 135–145)
Total Bilirubin: 0.9 mg/dL (ref 0.3–1.2)
Total Protein: 6 g/dL — ABNORMAL LOW (ref 6.5–8.1)

## 2020-01-16 LAB — IRON AND TIBC
Iron: 65 ug/dL (ref 28–170)
Saturation Ratios: 20 % (ref 10.4–31.8)
TIBC: 324 ug/dL (ref 250–450)
UIBC: 259 ug/dL

## 2020-01-16 LAB — TROPONIN I (HIGH SENSITIVITY)
Troponin I (High Sensitivity): 2 ng/L
Troponin I (High Sensitivity): 2 ng/L (ref <18)

## 2020-01-16 LAB — RESPIRATORY PANEL BY RT PCR (FLU A&B, COVID)
Influenza A by PCR: NEGATIVE
Influenza B by PCR: NEGATIVE
SARS Coronavirus 2 by RT PCR: NEGATIVE

## 2020-01-16 LAB — RETICULOCYTES
Immature Retic Fract: 11.5 % (ref 2.3–15.9)
RBC.: 2.72 MIL/uL — ABNORMAL LOW (ref 3.87–5.11)
Retic Count, Absolute: 44.6 10*3/uL (ref 19.0–186.0)
Retic Ct Pct: 1.6 % (ref 0.4–3.1)

## 2020-01-16 LAB — FERRITIN: Ferritin: 53 ng/mL (ref 11–307)

## 2020-01-16 LAB — FOLATE: Folate: 21.5 ng/mL

## 2020-01-16 LAB — VITAMIN B12: Vitamin B-12: 239 pg/mL (ref 180–914)

## 2020-01-16 MED ORDER — ONDANSETRON HCL 4 MG PO TABS: 4.0000 mg | ORAL_TABLET | Freq: Four times a day (QID) | ORAL | Status: DC | PRN

## 2020-01-16 MED ORDER — APIXABAN 5 MG PO TABS
5.0000 mg | ORAL_TABLET | Freq: Two times a day (BID) | ORAL | Status: DC
  Administered 2020-01-16 – 2020-01-17 (×2): 5 mg via ORAL
  Filled 2020-01-16 (×2): qty 1

## 2020-01-16 MED ORDER — SODIUM CHLORIDE 0.9 % IV BOLUS
1000.0000 mL | Freq: Once | INTRAVENOUS | Status: AC
  Administered 2020-01-16: 17:00:00 1000 mL via INTRAVENOUS

## 2020-01-16 MED ORDER — LISINOPRIL-HYDROCHLOROTHIAZIDE 20-12.5 MG PO TABS: 1.0000 | ORAL_TABLET | Freq: Every morning | ORAL | Status: DC

## 2020-01-16 MED ORDER — EZETIMIBE-SIMVASTATIN 10-40 MG PO TABS
1.0000 | ORAL_TABLET | Freq: Every day | ORAL | Status: DC
  Filled 2020-01-16 (×2): qty 1

## 2020-01-16 MED ORDER — SODIUM CHLORIDE 0.9% FLUSH
3.0000 mL | Freq: Two times a day (BID) | INTRAVENOUS | Status: DC
  Administered 2020-01-16: 3 mL via INTRAVENOUS

## 2020-01-16 MED ORDER — LISINOPRIL 20 MG PO TABS
20.0000 mg | ORAL_TABLET | Freq: Every day | ORAL | Status: DC
  Filled 2020-01-16: qty 1

## 2020-01-16 MED ORDER — HYDROCHLOROTHIAZIDE 12.5 MG PO CAPS
12.5000 mg | ORAL_CAPSULE | Freq: Every day | ORAL | Status: DC
  Filled 2020-01-16: qty 1

## 2020-01-16 MED ORDER — ONDANSETRON HCL 4 MG/2ML IJ SOLN: 4.0000 mg | Freq: Four times a day (QID) | INTRAMUSCULAR | Status: DC | PRN

## 2020-01-16 MED ORDER — ACETAMINOPHEN 500 MG PO TABS: 500.0000 mg | ORAL_TABLET | Freq: Four times a day (QID) | ORAL | Status: DC | PRN

## 2020-01-16 MED ORDER — METOPROLOL SUCCINATE ER 25 MG PO TB24
12.5000 mg | ORAL_TABLET | Freq: Every day | ORAL | Status: DC
  Filled 2020-01-16: qty 1

## 2020-01-16 NOTE — ED Notes (Signed)
ED TO INPATIENT HANDOFF REPORT  Name/Age/Gender Erica Frank 79 y.o. female  Code Status    Code Status Orders  (From admission, onward)         Start     Ordered   01/16/20 1914  Full code  Continuous     01/16/20 1915        Code Status History    Date Active Date Inactive Code Status Order ID Comments User Context   07/09/2019 0606 07/10/2019 1923 Full Code [id number]  Yates Decamp, MD Inpatient   Advance Care Planning Activity      Home/SNF/Other Home  Chief Complaint Syncope [R55]  Level of Care/Admitting Diagnosis ED Disposition    ED Disposition Condition Comment   Admit  Hospital Area: [facility] [[id number]]  Level of Care: Telemetry [5]  Admit to tele based on following criteria: Eval of Syncope  Covid Evaluation: Asymptomatic Screening Protocol (No Symptoms)  Diagnosis: Syncope [206001]  Admitting Physician: Anselm Jungling [[id number]]  Attending Physician: Anselm Jungling [[id number]]       Medical History Past Medical History:  Diagnosis Date  . Anxiety   . Arthritis   . Atherosclerosis   . Atrial fibrillation (HCC)   . Coronary artery disease   . Heart attack (HCC)   . Heart disease   . High cholesterol   . Hypertension     Allergies No Known Allergies  IV Location/Drains/Wounds Patient Lines/Drains/Airways Status   Active Line/Drains/Airways    Name:   Placement date:   Placement time:   Site:   Days:   Peripheral IV 01/16/20 Left Forearm   01/16/20    1648    Forearm   less than 1          Labs/Imaging Results for orders placed or performed during the hospital encounter of 01/16/20 (from the past 48 hour(s))  Urinalysis, Routine w reflex microscopic     Status: Abnormal   Collection Time: 01/16/20  3:53 PM  Result Value Ref Range   Color, Urine YELLOW YELLOW   APPearance CLEAR CLEAR   Specific Gravity, Urine 1.014 1.005 - 1.030   pH 6.0 5.0 - 8.0   Glucose, UA NEGATIVE NEGATIVE mg/dL   Hgb urine dipstick NEGATIVE  NEGATIVE   Bilirubin Urine NEGATIVE NEGATIVE   Ketones, ur 5 (A) NEGATIVE mg/dL   Protein, ur NEGATIVE NEGATIVE mg/dL   Nitrite NEGATIVE NEGATIVE   Leukocytes,Ua NEGATIVE NEGATIVE    Comment: Performed at [facility], [address] [place], [place] [address]  Respiratory Panel by RT PCR (Flu A&B, Covid) - Nasopharyngeal Swab     Status: None   Collection Time: 01/16/20  3:56 PM   Specimen: Nasopharyngeal Swab  Result Value Ref Range   SARS Coronavirus 2 by RT PCR NEGATIVE NEGATIVE    Comment: (NOTE) SARS-CoV-2 target nucleic acids are NOT DETECTED. The SARS-CoV-2 RNA is generally detectable in upper respiratoy specimens during the acute phase of infection. The lowest concentration of SARS-CoV-2 viral copies this assay can detect is 131 copies/mL. A negative result does not preclude SARS-Cov-2 infection and should not be used as the sole basis for treatment or other patient management decisions. A negative result may occur with  improper specimen collection/handling, submission of specimen other than nasopharyngeal swab, presence of viral mutation(s) within the areas targeted by this assay, and inadequate number of viral copies (<131 copies/mL). A negative result must be combined with clinical observations, patient history, and epidemiological information. The  expected result is Negative. Fact Sheet for Patients:  PinkCheek.be Fact Sheet for Healthcare Providers:  GravelBags.it This test is not yet ap proved or cleared by the [place] FDA and  has been authorized for detection and/or diagnosis of SARS-CoV-2 by FDA under an Emergency Use Authorization (EUA). This EUA will remain  in effect (meaning this test can be used) for the duration of the COVID-19 declaration under Section 564(b)(1) of the Act, 21 U.S.C. section 360bbb-3(b)(1), unless the authorization is terminated or revoked sooner.     Influenza A by PCR NEGATIVE NEGATIVE   Influenza B by PCR NEGATIVE NEGATIVE    Comment: (NOTE) The Xpert Xpress SARS-CoV-2/FLU/RSV assay is intended as an aid in  the diagnosis of influenza from Nasopharyngeal swab specimens and  should not be used as a sole basis for treatment. Nasal washings and  aspirates are unacceptable for Xpert Xpress SARS-CoV-2/FLU/RSV  testing. Fact Sheet for Patients: PinkCheek.be Fact Sheet for Healthcare Providers: GravelBags.it This test is not yet approved or cleared by the [place] FDA and  has been authorized for detection and/or diagnosis of SARS-CoV-2 by  FDA under an Emergency Use Authorization (EUA). This EUA will remain  in effect (meaning this test can be used) for the duration of the  Covid-19 declaration under Section 564(b)(1) of the Act, 21  U.S.C. section 360bbb-3(b)(1), unless the authorization is  terminated or revoked. Performed at [facility], [place] [address] [place], [place] [address]   CBC with Differential/Platelet     Status: Abnormal   Collection Time: 01/16/20  4:22 PM  Result Value Ref Range   WBC 7.0 4.0 - 10.5 K/uL   RBC 2.67 (L) 3.87 - 5.11 MIL/uL   Hemoglobin 8.5 (L) 12.0 - 15.0 g/dL   HCT 28.0 (L) 36.0 - 46.0 %   MCV 104.9 (H) 80.0 - 100.0 fL   MCH 31.8 26.0 - 34.0 pg   MCHC 30.4 30.0 - 36.0 g/dL   RDW 14.2 11.5 - 15.5 %   Platelets 146 (L) 150 - 400 K/uL   nRBC 0.0 0.0 - 0.2 %   Neutrophils Relative % 74 %   Neutro Abs 5.1 1.7 - 7.7 K/uL   Lymphocytes Relative 17 %   Lymphs Abs 1.2 0.7 - 4.0 K/uL   Monocytes Relative 8 %   Monocytes Absolute 0.6 0.1 - 1.0 K/uL   Eosinophils Relative 1 %   Eosinophils Absolute 0.1 0.0 - 0.5 K/uL   Basophils Relative 0 %   Basophils Absolute 0.0 0.0 - 0.1 K/uL   Immature Granulocytes 0 %   Abs Immature Granulocytes 0.03 0.00 - 0.07 K/uL    Comment: Performed at [facility], [place] [address] [place], [place] [address]  Comprehensive metabolic panel     Status: Abnormal   Collection Time: 01/16/20  4:22 PM  Result Value Ref Range   Sodium 139 135 - 145 mmol/L   Potassium 4.8 3.5 - 5.1 mmol/L   Chloride 110 98 - 111 mmol/L   CO2 20 (L) 22 - 32 mmol/L   Glucose, Bld 96 70 - 99 mg/dL   BUN 21 8 - 23 mg/dL   Creatinine, Ser 0.88 0.44 - 1.00 mg/dL   Calcium 8.2 (L) 8.9 - 10.3 mg/dL   Total Protein 6.0 (L) 6.5 - 8.1 g/dL   Albumin 3.4 (L) 3.5 - 5.0 g/dL   AST 24 15 - 41 U/L   ALT 19 0 - 44 U/L  Alkaline Phosphatase 47 38 - 126 U/L   Total Bilirubin 0.9 0.3 - 1.2 mg/dL   GFR calc non Af Amer >60 >60 mL/min   GFR calc Af Amer >60 >60 mL/min   Anion gap 9 5 - 15    Comment: Performed at [facility], [address], [place], [place] [address]  Troponin I (High Sensitivity)     Status: None   Collection Time: 01/16/20  4:22 PM  Result Value Ref Range   Troponin I (High Sensitivity) 2 <18 ng/L    Comment: (NOTE) Elevated high sensitivity troponin I (hsTnI) values and significant  changes across serial measurements may suggest ACS but many other  chronic and acute conditions are known to elevate hsTnI results.  Refer to the "Links" section for chest pain algorithms and additional  guidance. Performed at [facility], [address], [place], [place] [address]   Reticulocytes     Status: Abnormal   Collection Time: 01/16/20  4:22 PM  Result Value Ref Range   Retic Ct Pct 1.6 0.4 - 3.1 %   RBC. 2.72 (L) 3.87 - 5.11 MIL/uL   Retic Count, Absolute 44.6 19.0 - 186.0 K/uL   Immature Retic Fract 11.5 2.3 - 15.9 %    Comment: Performed at [facility], [address] [place], [place] [address]  Vitamin B12     Status: None   Collection Time: 01/16/20  5:08 PM  Result Value Ref Range   Vitamin B-12 239 180 - 914 pg/mL    Comment: (NOTE) This assay is not validated for testing neonatal or myeloproliferative  syndrome specimens for Vitamin B12 levels. Performed at [facility], [address], [place], [place] [address]   Folate     Status: None   Collection Time: 01/16/20  5:08 PM  Result Value Ref Range   Folate 21.5 >5.9 ng/mL    Comment: Performed at [facility], [address] [place], [place] [address]  Iron and TIBC     Status: None   Collection Time: 01/16/20  5:08 PM  Result Value Ref Range   Iron 65 28 - 170 ug/dL   TIBC 696 295 - 284 ug/dL   Saturation Ratios 20 10.4 - 31.8 %   UIBC 259 ug/dL    Comment: Performed at [facility], [address] [place], [place] [address]  Ferritin     Status: None   Collection Time: 01/16/20  5:08 PM  Result Value Ref Range   Ferritin 53 11 - 307 ng/mL    Comment: Performed at [facility], [address], [place], [place] [address]  Troponin I (High Sensitivity)     Status: None   Collection Time: 01/16/20  5:52 PM  Result Value Ref Range   Troponin I (High Sensitivity) 2 <18 ng/L    Comment: (NOTE) Elevated high sensitivity troponin I (hsTnI) values and significant  changes across serial measurements may suggest ACS but many other  chronic and acute conditions are known to elevate hsTnI results.  Refer to the "Links" section for chest pain algorithms and additional  guidance. Performed at [facility], [address] [place], [place] [address]    CT Head Wo Contrast  Result Date: 01/16/2020 CLINICAL DATA:  Recent syncopal episode EXAM: CT HEAD WITHOUT CONTRAST TECHNIQUE: Contiguous axial images were obtained from the base of the skull through the vertex without intravenous contrast. COMPARISON:  11/09/2019  FINDINGS: Brain: Bilateral basal ganglia calcifications are seen. No findings to suggest acute hemorrhage, acute infarct or space-occupying mass lesion are noted. Vascular: No hyperdense vessel or unexpected calcification. Skull: Normal.  Negative for fracture or focal lesion. Sinuses/Orbits: Mucosal thickening is noted within the right maxillary antrum new from the prior exam. Other: None. IMPRESSION: No acute intracranial abnormality noted. Diffuse mucosal thickening within the right maxillary antrum. Electronically Signed   By: Alcide Clever M.D.   On: 01/16/2020 16:50   DG Chest Port 1 View  Result Date: 01/16/2020 CLINICAL DATA:  Episode of unconsciousness, vomiting EXAM: PORTABLE CHEST 1 VIEW COMPARISON:  07/16/2019 FINDINGS: The heart size and mediastinal contours are within normal limits. Both lungs are clear. The visualized skeletal structures are unremarkable. IMPRESSION: No active disease. Electronically Signed   By: Sharlet Salina M.D.   On: 01/16/2020 16:22    Pending Labs Unresulted Labs (From admission, onward)    Start     Ordered   01/17/20 0500  TSH  Tomorrow morning,   R     01/16/20 1915   01/17/20 0500  Basic metabolic panel  Tomorrow morning,   R     01/16/20 1915   01/17/20 0500  CBC  Tomorrow morning,   R     01/16/20 1915   01/16/20 1709  Occult blood card to lab, stool RN will collect  Once,   STAT    Question:  Specimen to be collected by:  Answer:  RN will collect   01/16/20 1708          Vitals/Pain Today's Vitals   01/16/20 1800 01/16/20 1911 01/16/20 1930 01/16/20 2000  BP: 111/78 138/69 131/68 137/75  Pulse: 71 (!) 59 62 (!) 59  Resp: 20 13 19 14   Temp:      TempSrc:      SpO2: 99% 95% 90% 96%  PainSc:        Isolation Precautions No active isolations  Medications Medications  sodium chloride flush (NS) 0.9 % injection 3 mL (has no administration in time range)  ondansetron (ZOFRAN) tablet 4 mg (has no administration in time range)    Or  ondansetron (ZOFRAN) injection 4 mg (has no administration in time range)  sodium chloride 0.9 % bolus 1,000 mL (0 mLs Intravenous Stopped 01/16/20 1808)    Mobility walks

## 2020-01-16 NOTE — ED Notes (Signed)
Daughter updated and notified pt admit to 1512.

## 2020-01-16 NOTE — ED Provider Notes (Signed)
[facility] DEPT Provider Note   CSN: [id number] Arrival date & time: 01/16/20  1451     History Chief Complaint  Patient presents with   Emesis   Loss of Consciousness    Erica Frank is a 79 y.o. female.  Patient was at the table to have lunch became weak and passed out.  She vomited a few times after that.  She was out for about 3 minutes.  Patient states she has been cold and weak and pale lately.  She takes Eliquis for atrial fib and is on a beta-blocker  The history is provided by the patient. No language interpreter was used.  Loss of Consciousness Episode history:  Single Most recent episode:  Today Timing:  Intermittent Progression:  Resolved Chronicity:  New Context: not blood draw   Witnessed: no   Relieved by:  Nothing Associated symptoms: dizziness   Associated symptoms: no chest pain, no headaches and no seizures        Past Medical History:  Diagnosis Date   Anxiety    Arthritis    Atherosclerosis    Atrial fibrillation ([place])    Coronary artery disease    Heart attack ([place])    Heart disease    High cholesterol    Hypertension     Patient Active Problem List   Diagnosis Date Noted   Elevated troponin 07/09/2019   NSTEMI (non-ST elevated myocardial infarction) ([place]) 07/09/2019   Paroxysmal atrial fibrillation ([place]) 02/15/2019   Mixed hyperlipidemia 02/15/2019   Bradycardia 02/15/2019   Coronary artery disease involving native coronary artery of native heart with unstable angina pectoris ([place]) 12/20/2018   Dyslipidemia 12/20/2018   Essential hypertension 12/20/2018    Past Surgical History:  Procedure Laterality Date   ABDOMINAL HYSTERECTOMY  1983   for endometriosis with [place]   ANGIOPLASTY     ANGIOPLASTY     BACK SURGERY     CARDIAC SURGERY     Catheterization   CORONARY STENT INTERVENTION N/A 07/09/2019   Procedure: CORONARY STENT INTERVENTION;  Surgeon: Adrian Prows, MD;   Location: [facility];  Service: Cardiovascular;  Laterality: N/A;   LEFT HEART CATH AND CORONARY ANGIOGRAPHY N/A 07/09/2019   Procedure: LEFT HEART CATH AND CORONARY ANGIOGRAPHY and possible intervention;  Surgeon: Adrian Prows, MD;  Location: [facility];  Service: Cardiovascular;  Laterality: N/A;  4: or 4:30 PM today   NECK SURGERY       OB History    Gravida  1   Para  1   Term      Preterm      AB      Living  1     SAB      TAB      Ectopic      Multiple      Live Births              Family History  Problem Relation Age of Onset   Uterine cancer Mother    Hypertension Brother    Heart disease Brother    Breast cancer Cousin     Social History   Tobacco Use   Smoking status: Never Smoker   Smokeless tobacco: Never Used  Substance Use Topics   Alcohol use: Yes    Comment: Occasional   Drug use: No    Home Medications Prior to Admission medications   Medication Sig Start Date End Date Taking? Authorizing Provider  acetaminophen (TYLENOL) 500 MG  tablet Take 500 mg by mouth every 6 (six) hours as needed for mild pain. Up to 4 a day   Yes [provider]  apixaban (ELIQUIS) 5 MG TABS tablet TAKE 1 TABLET(5 MG) BY MOUTH TWICE DAILY Patient taking differently: Take 5 mg by mouth 2 (two) times daily.  09/21/19  Yes Yates Decamp, MD  BIOTIN PO Take 1 tablet by mouth at bedtime.   Yes [provider]  Calcium Carbonate-Vit D-Min (CALCIUM 1200 PO) Take 1 tablet by mouth daily as needed (when thinks about it).    Yes [provider]  co-enzyme Q-10 30 MG capsule Take 30 mg by mouth once a week.    Yes [provider]  Cyanocobalamin (VITAMIN B 12 PO) Take 1 tablet by mouth once a week.    Yes [provider]  Evolocumab (REPATHA) 140 MG/ML SOSY Inject 1 Syringe into the skin every 14 (fourteen) days. 10/22/19  Yes Yates Decamp, MD  ezetimibe-simvastatin (VYTORIN) 10-40 MG tablet TAKE 1 TABLET BY  MOUTH DAILY 02/09/19  Yes Georgeanna Lea, MD  lisinopril-hydrochlorothiazide (ZESTORETIC) 20-12.5 MG tablet Take 1 tablet by mouth every morning. 11/19/19  Yes Yates Decamp, MD  methocarbamol (ROBAXIN) 500 MG tablet Take 500 mg by mouth every 6 (six) hours as needed. 11/09/19  Yes [provider]  metoprolol succinate (TOPROL-XL) 25 MG 24 hr tablet Take 0.5 tablets (12.5 mg total) by mouth daily. 10/25/19  Yes Yates Decamp, MD  Multiple Vitamins-Minerals (MULTIVITAMIN ADULT PO) Take 1 tablet by mouth once a week.    Yes [provider]  nitroGLYCERIN (NITROSTAT) 0.4 MG SL tablet Place 1 tablet (0.4 mg total) under the tongue every 5 (five) minutes as needed for chest pain. 12/20/18  Yes Georgeanna Lea, MD  TURMERIC PO Take 1 tablet by mouth once a week.    Yes [provider]    Allergies    Patient has no known allergies.  Review of Systems   Review of Systems  Constitutional: Negative for appetite change and fatigue.  HENT: Negative for congestion, ear discharge and sinus pressure.   Eyes: Negative for discharge.  Respiratory: Negative for cough.   Cardiovascular: Positive for syncope. Negative for chest pain.  Gastrointestinal: Negative for abdominal pain and diarrhea.  Genitourinary: Negative for frequency and hematuria.  Musculoskeletal: Negative for back pain.  Skin: Negative for rash.  Neurological: Positive for dizziness. Negative for seizures and headaches.  Psychiatric/Behavioral: Negative for hallucinations.    Physical Exam Updated Vital Signs BP 111/78    Pulse 71    Temp 97.8 F (36.6 C) (Oral)    Resp 20    SpO2 99%   Physical Exam Vitals reviewed.  Constitutional:      Appearance: She is well-developed.  HENT:     Head: Normocephalic.     Nose: Nose normal.  Eyes:     General: No scleral icterus.    Conjunctiva/sclera: Conjunctivae normal.  Neck:     Thyroid: No thyromegaly.  Cardiovascular:     Rate and Rhythm: Normal rate and  regular rhythm.     Heart sounds: No murmur. No friction rub. No gallop.   Pulmonary:     Breath sounds: No stridor. No wheezing or rales.  Chest:     Chest wall: No tenderness.  Abdominal:     General: There is no distension.     Tenderness: There is no abdominal tenderness. There is no rebound.  Genitourinary:    Comments: Rectal  heme-negative Musculoskeletal:        General: Normal range of motion.     Cervical back: Neck supple.  Lymphadenopathy:     Cervical: No cervical adenopathy.  Skin:    Findings: No erythema or rash.  Neurological:     Mental Status: She is alert and oriented to person, place, and time.     Motor: No abnormal muscle tone.     Coordination: Coordination normal.  Psychiatric:        Behavior: Behavior normal.     ED Results / Procedures / Treatments   Labs (all labs ordered are listed, but only abnormal results are displayed) Labs Reviewed  CBC WITH DIFFERENTIAL/PLATELET - Abnormal; Notable for the following components:      Result Value   RBC 2.67 (*)    Hemoglobin 8.5 (*)    HCT 28.0 (*)    MCV 104.9 (*)    Platelets 146 (*)    All other components within normal limits  COMPREHENSIVE METABOLIC PANEL - Abnormal; Notable for the following components:   CO2 20 (*)    Calcium 8.2 (*)    Total Protein 6.0 (*)    Albumin 3.4 (*)    All other components within normal limits  RETICULOCYTES - Abnormal; Notable for the following components:   RBC. 2.72 (*)    All other components within normal limits  RESPIRATORY PANEL BY RT PCR (FLU A&B, COVID)  URINALYSIS, ROUTINE W REFLEX MICROSCOPIC  VITAMIN B12  FOLATE  IRON AND TIBC  FERRITIN  OCCULT BLOOD X 1 CARD TO LAB, STOOL  TROPONIN I (HIGH SENSITIVITY)  TROPONIN I (HIGH SENSITIVITY)    EKG None  Radiology CT Head Wo Contrast  Result Date: 01/16/2020 CLINICAL DATA:  Recent syncopal episode EXAM: CT HEAD WITHOUT CONTRAST TECHNIQUE: Contiguous axial images were obtained from the base of the  skull through the vertex without intravenous contrast. COMPARISON:  11/09/2019 FINDINGS: Brain: Bilateral basal ganglia calcifications are seen. No findings to suggest acute hemorrhage, acute infarct or space-occupying mass lesion are noted. Vascular: No hyperdense vessel or unexpected calcification. Skull: Normal. Negative for fracture or focal lesion. Sinuses/Orbits: Mucosal thickening is noted within the right maxillary antrum new from the prior exam. Other: None. IMPRESSION: No acute intracranial abnormality noted. Diffuse mucosal thickening within the right maxillary antrum. Electronically Signed   By: Alcide Clever M.D.   On: 01/16/2020 16:50   DG Chest Port 1 View  Result Date: 01/16/2020 CLINICAL DATA:  Episode of unconsciousness, vomiting EXAM: PORTABLE CHEST 1 VIEW COMPARISON:  07/16/2019 FINDINGS: The heart size and mediastinal contours are within normal limits. Both lungs are clear. The visualized skeletal structures are unremarkable. IMPRESSION: No active disease. Electronically Signed   By: Sharlet Salina M.D.   On: 01/16/2020 16:22    Procedures Procedures (including critical care time)  Medications Ordered in ED Medications  sodium chloride 0.9 % bolus 1,000 mL (0 mLs Intravenous Stopped 01/16/20 1808)    ED Course  I have reviewed the triage vital signs and the nursing notes.  Pertinent labs & imaging results that were available during my care of the patient were reviewed by me and considered in my medical decision making (see chart for details).    MDM Rules/Calculators/A&P                      Patient with syncope and anemia.  She will be evaluated by medicine Final Clinical Impression(s) / ED Diagnoses Final diagnoses:  Syncope and collapse    Rx / DC Orders ED Discharge Orders    None       Bethann Berkshire, MD 01/16/20 [phone]

## 2020-01-16 NOTE — ED Triage Notes (Signed)
Pt was eating lucn at a restaurant with some of her friends when she slumped over at the table and became unconscious. Per friends, pt began vomiting multiple time in a row, and after 3 min pt became alert. Friends supported pt in the chair. Pt was never lowered to the floor or hit her head. Pt states she felt fine this am, however no she is c/o 3/10 abd pain. EMS administered 4mg  of Zofran and of NS.

## 2020-01-16 NOTE — H&P (Signed)
History and Physical    Erica Frank HTX:[id number] DOB: [birth year] DOA: 01/16/2020  PCP: Chilton Greathouse, MD Patient coming from: Home, lives with daughter who is a Engineer, civil (consulting)  I have personally briefly reviewed patient's old medical records in [facility]  Chief Complaint: Syncope  HPI: Erica Frank is a 79 y.o. female with medical history significant of CAD s/p balloon angioplasty in 1989 and 1990 and NSTEMI s/p stent to mid LAD, paroxysmal atrial fibrillation, HLD, HTN who presents with concerns of syncope.    Patient today was eating lunch at a friend's house and suddenly felt flushing and vomited before she had loss of consciousness.  Thinks she was unconscious for about 2 to 3 minutes.  Denies any loss of bowel or urinary incontinent.  Denies any seizure-like activities.  She had a glass of wine to drink at lunch but this is usual for her.  She states that she was otherwise in her normal state of health.  Although she has noticed that she just looks "unwell" and pale lately. She has had history of syncope in the past due to vertigo and dehydration but she denies any dizziness prior to her episode this time.  Denies any chest pain, palpitation or shortness of breath.  On arrival, she was afebrile, normotensive and occasionally bradycardic down to 50s on room air.  Lab was unremarkable for new anemia with hemoglobin of 8.5 from a prior of 13.7 five months ago. Platelet of 146 from prior normal. BMP with no significant abnormalities.  Patient is on Eliquis for paroxysmal atrial fibrillation and endorsed having black tarry stool here but per ED physician reported her Hemoccult was negative although not yet documented in chart.  Patient states that about a month ago her Hemoccult was positive at her PCP and she just saw her GI physician on 1/29 and had a repeat Hemoccult that was negative.  Her last colonoscopy was about 2 to 3 years ago with no significant finding. States her stools  have changed color although not melena-like and increase in frequency ever since she started taking Repatha for her cholesterol. Denies any nosebleed or bleeding of the oral mucosa.  Patient has vitamin B12 on her medication list but states that she only takes that occasionally.  She denies any significant memory issues or peripheral neuropathy. Patient reports that she has not really eaten much meat due to low appetite from depressed mood due to her husband's passing 2-1/2 years ago.  She has had chronic bradycardia without any symptoms.  She says that ever since her cardiologist adjusted her lisinopril and metoprolol dose she has been having fluctuating blood pressure mostly with issues of hypotension.  This morning she checked BP at home and it was about 106/70.  She also has history of Graves' disease and states that she was never started on medication because she was told that" it was okay."  Denies tobacco or illicit drug use. Denies any family history of any coagulopathy.    Review of Systems:  Constitutional: No Weight Change, No Fever ENT/Mouth: No sore throat, No Rhinorrhea Eyes: No Eye Pain, No Vision Changes Cardiovascular: No Chest Pain, no SOB, No Palpitations Respiratory: No Cough, No Sputum, No Wheezing, no Dyspnea  Gastrointestinal: No Nausea, + Vomiting, No Diarrhea, No Constipation, No Pain Genitourinary: no Urinary Incontinence Musculoskeletal: No Arthralgias, No Myalgias Skin: No Skin Lesions, No Pruritus, Neuro: no Weakness, No Numbness,  + Loss of Consciousness,+ Syncope Psych: No Anxiety/Panic, No Depression,+ decrease appetite Heme/Lymph:  No Bruising, No Bleeding  Past Medical History:  Diagnosis Date  . Anxiety   . Arthritis   . Atherosclerosis   . Atrial fibrillation (HCC)   . Coronary artery disease   . Heart attack (HCC)   . Heart disease   . High cholesterol   . Hypertension     Past Surgical History:  Procedure Laterality Date  . ABDOMINAL  HYSTERECTOMY  1983   for endometriosis with Burch  . ANGIOPLASTY    . ANGIOPLASTY    . BACK SURGERY    . CARDIAC SURGERY     Catheterization  . CORONARY STENT INTERVENTION N/A 07/09/2019   Procedure: CORONARY STENT INTERVENTION;  Surgeon: Yates Decamp, MD;  Location: [facility] INVASIVE CV LAB;  Service: Cardiovascular;  Laterality: N/A;  . LEFT HEART CATH AND CORONARY ANGIOGRAPHY N/A 07/09/2019   Procedure: LEFT HEART CATH AND CORONARY ANGIOGRAPHY and possible intervention;  Surgeon: Yates Decamp, MD;  Location: [facility] INVASIVE CV LAB;  Service: Cardiovascular;  Laterality: N/A;  4: or 4:30 PM today  . NECK SURGERY       reports that she has never smoked. She has never used smokeless tobacco. She reports current alcohol use. She reports that she does not use drugs.  No Known Allergies  Family History  Problem Relation Age of Onset  . Uterine cancer Mother   . Hypertension Brother   . Heart disease Brother   . Breast cancer Cousin      Prior to Admission medications   Medication Sig Start Date End Date Taking? Authorizing Provider  acetaminophen (TYLENOL) 500 MG tablet Take 500 mg by mouth every 6 (six) hours as needed for mild pain. Up to 4 a day   Yes [provider]  apixaban (ELIQUIS) 5 MG TABS tablet TAKE 1 TABLET(5 MG) BY MOUTH TWICE DAILY Patient taking differently: Take 5 mg by mouth 2 (two) times daily.  09/21/19  Yes Yates Decamp, MD  BIOTIN PO Take 1 tablet by mouth at bedtime.   Yes [provider]  Calcium Carbonate-Vit D-Min (CALCIUM 1200 PO) Take 1 tablet by mouth daily as needed (when thinks about it).    Yes [provider]  co-enzyme Q-10 30 MG capsule Take 30 mg by mouth once a week.    Yes [provider]  Cyanocobalamin (VITAMIN B 12 PO) Take 1 tablet by mouth once a week.    Yes [provider]  Evolocumab (REPATHA) 140 MG/ML SOSY Inject 1 Syringe into the skin every 14 (fourteen) days. 10/22/19  Yes Yates Decamp, MD    ezetimibe-simvastatin (VYTORIN) 10-40 MG tablet TAKE 1 TABLET BY MOUTH DAILY 02/09/19  Yes Georgeanna Lea, MD  lisinopril-hydrochlorothiazide (ZESTORETIC) 20-12.5 MG tablet Take 1 tablet by mouth every morning. 11/19/19  Yes Yates Decamp, MD  methocarbamol (ROBAXIN) 500 MG tablet Take 500 mg by mouth every 6 (six) hours as needed. 11/09/19  Yes [provider]  metoprolol succinate (TOPROL-XL) 25 MG 24 hr tablet Take 0.5 tablets (12.5 mg total) by mouth daily. 10/25/19  Yes Yates Decamp, MD  Multiple Vitamins-Minerals (MULTIVITAMIN ADULT PO) Take 1 tablet by mouth once a week.    Yes [provider]  nitroGLYCERIN (NITROSTAT) 0.4 MG SL tablet Place 1 tablet (0.4 mg total) under the tongue every 5 (five) minutes as needed for chest pain. 12/20/18  Yes Georgeanna Lea, MD  TURMERIC PO Take 1 tablet by mouth once a week.    Yes [provider]  Physical Exam: Vitals:   01/16/20 1600 01/16/20 1700 01/16/20 1730 01/16/20 1800  BP: 104/65 (!) 146/80 133/66 111/78  Pulse: (!) 55 63 62 71  Resp: (!) 21 15 14 20   Temp:      TempSrc:      SpO2: 94% 94% 95% 99%    Constitutional: NAD, calm, comfortable, well appearing female laying flat in bed Vitals:   01/16/20 1600 01/16/20 1700 01/16/20 1730 01/16/20 1800  BP: 104/65 (!) 146/80 133/66 111/78  Pulse: (!) 55 63 62 71  Resp: (!) 21 15 14 20   Temp:      TempSrc:      SpO2: 94% 94% 95% 99%   Eyes: PERRL, lids and conjunctivae normal ENMT: Mucous membranes are moist. Posterior pharynx clear of any exudate or lesions.Normal dentition.  Neck: normal, supple Respiratory: clear to auscultation bilaterally, no wheezing, no crackles. Normal respiratory effort on room air.  Cardiovascular: Irregularly irregular rhythm with bradycardia, no murmurs / rubs / gallops. No extremity edema. 2+ pedal pulses.  Abdomen: no tenderness, no masses palpated. Bowel sounds positive.  Musculoskeletal: no clubbing / cyanosis. No joint  deformity upper and lower extremities. Good ROM, no contractures. Normal muscle tone.  Skin: no rashes, lesions, ulcers. No induration. No oral lesions or petechiae. Neurologic: CN 2-12 grossly intact. Sensation intact. Strength 5/5 in all 4.  Negative Dix-Hallpike maneuver.  No nystagmus. Psychiatric: Normal judgment and insight. Alert and oriented x 3. Normal mood.     Labs on Admission: I have personally reviewed following labs and imaging studies  CBC: Recent Labs  Lab 01/16/20 1622  WBC 7.0  NEUTROABS 5.1  HGB 8.5*  HCT 28.0*  MCV 104.9*  PLT 675*   Basic Metabolic Panel: Recent Labs  Lab 01/16/20 1622  NA 139  K 4.8  CL 110  CO2 20*  GLUCOSE 96  BUN 21  CREATININE 0.88  CALCIUM 8.2*   GFR: Estimated Creatinine Clearance: 43.1 mL/min (by C-G formula based on SCr of 0.88 mg/dL). Liver Function Tests: Recent Labs  Lab 01/16/20 1622  AST 24  ALT 19  ALKPHOS 47  BILITOT 0.9  PROT 6.0*  ALBUMIN 3.4*   No results for input(s): LIPASE, AMYLASE in the last 168 hours. No results for input(s): AMMONIA in the last 168 hours. Coagulation Profile: No results for input(s): INR, PROTIME in the last 168 hours. Cardiac Enzymes: No results for input(s): CKTOTAL, CKMB, CKMBINDEX, TROPONINI in the last 168 hours. BNP (last 3 results) No results for input(s): PROBNP in the last 8760 hours. HbA1C: No results for input(s): HGBA1C in the last 72 hours. CBG: No results for input(s): GLUCAP in the last 168 hours. Lipid Profile: No results for input(s): CHOL, HDL, LDLCALC, TRIG, CHOLHDL, LDLDIRECT in the last 72 hours. Thyroid Function Tests: No results for input(s): TSH, T4TOTAL, FREET4, T3FREE, THYROIDAB in the last 72 hours. Anemia Panel: Recent Labs    01/16/20 1622  RETICCTPCT 1.6   Urine analysis:    Component Value Date/Time   COLORURINE YELLOW 01/16/2020 1553   APPEARANCEUR CLEAR 01/16/2020 1553   LABSPEC 1.014 01/16/2020 1553   PHURINE 6.0 01/16/2020 1553     GLUCOSEU NEGATIVE 01/16/2020 1553   HGBUR NEGATIVE 01/16/2020 1553   BILIRUBINUR NEGATIVE 01/16/2020 1553   KETONESUR 5 (A) 01/16/2020 1553   PROTEINUR NEGATIVE 01/16/2020 1553   UROBILINOGEN 0.2 01/24/2015 0542   NITRITE NEGATIVE 01/16/2020 1553   LEUKOCYTESUR NEGATIVE 01/16/2020 1553    Radiological Exams on Admission: CT Head Wo  Contrast  Result Date: 01/16/2020 CLINICAL DATA:  Recent syncopal episode EXAM: CT HEAD WITHOUT CONTRAST TECHNIQUE: Contiguous axial images were obtained from the base of the skull through the vertex without intravenous contrast. COMPARISON:  11/09/2019 FINDINGS: Brain: Bilateral basal ganglia calcifications are seen. No findings to suggest acute hemorrhage, acute infarct or space-occupying mass lesion are noted. Vascular: No hyperdense vessel or unexpected calcification. Skull: Normal. Negative for fracture or focal lesion. Sinuses/Orbits: Mucosal thickening is noted within the right maxillary antrum new from the prior exam. Other: None. IMPRESSION: No acute intracranial abnormality noted. Diffuse mucosal thickening within the right maxillary antrum. Electronically Signed   By: Alcide Clever M.D.   On: 01/16/2020 16:50   DG Chest Port 1 View  Result Date: 01/16/2020 CLINICAL DATA:  Episode of unconsciousness, vomiting EXAM: PORTABLE CHEST 1 VIEW COMPARISON:  07/16/2019 FINDINGS: The heart size and mediastinal contours are within normal limits. Both lungs are clear. The visualized skeletal structures are unremarkable. IMPRESSION: No active disease. Electronically Signed   By: Sharlet Salina M.D.   On: 01/16/2020 16:22    EKG: Independently reviewed.   Assessment/Plan  Syncope Suspect due to acute anemia and hypovolemia Admit to telemetry  Orthostatic vital signs Check TSH given hx of graves disease Work up for anemia as below  Acute anemia-macrocytic  Hgb 8.5 down from baseline of normal- could be due to change diet and non-compliant with vitamin  B12 Hemoccult negative per ED physician and nurse.  Patient reported black tarry stool blood per her nurse and EMT technician they denied seeing any melena. Retic count normal iron panel, vitamin B12 and folic pending Threshold to transfuse at Hgb > 8 given cardiac history Repeat CBC in the morning  Thrombocytopenia 146 down from normal  Workup as above for anemia  Asymptomatic bradycardic  Noted in cardiology outpatient in 11/2019 and was continued on 12.5mg  of Toprol Will continue Toprol here   Paroxysmal atrial fibrillation Admit on telemetry Rate-controlled Continue Eliquis given negative Hemoccult and findings of macrocytic anemia  Hypertension Continue lisinopril-HCTZ Normotensive here but per patient she has been having hypotension outpatient.  We will continue frequent blood pressure check.  Hx of CAD s/p mid-LAD stent stable  Hyperlipidemia continue ezetimibe-simvastatin     DVT prophylaxis:Eliquis Code Status: Full Family Communication: Plan discussed with patient at bedside  disposition Plan: Home with observation Consults called:  Admission status: Observation   Rolondo Pierre T Lamari Beckles DO [facility]   If 7PM-7AM, please contact night-coverage [email or web address]   01/16/2020, 7:15 PM

## 2020-01-17 ENCOUNTER — Other Ambulatory Visit: Payer: Self-pay | Admitting: Cardiology

## 2020-01-17 DIAGNOSIS — R55 Syncope and collapse: Secondary | ICD-10-CM | POA: Diagnosis not present

## 2020-01-17 DIAGNOSIS — D649 Anemia, unspecified: Secondary | ICD-10-CM | POA: Diagnosis not present

## 2020-01-17 LAB — CBC
HCT: 36.1 % (ref 36.0–46.0)
HCT: 40 % (ref 36.0–46.0)
Hemoglobin: 11.9 g/dL — ABNORMAL LOW (ref 12.0–15.0)
Hemoglobin: 13 g/dL (ref 12.0–15.0)
MCH: 31.6 pg (ref 26.0–34.0)
MCH: 31.9 pg (ref 26.0–34.0)
MCHC: 33 g/dL (ref 30.0–36.0)
MCHC: 32.5 g/dL (ref 30.0–36.0)
MCV: 96 fL (ref 80.0–100.0)
MCV: 98.3 fL (ref 80.0–100.0)
Platelets: 219 10*3/uL (ref 150–400)
Platelets: 237 10*3/uL (ref 150–400)
RBC: 3.76 MIL/uL — ABNORMAL LOW (ref 3.87–5.11)
RBC: 4.07 MIL/uL (ref 3.87–5.11)
RDW: 14 % (ref 11.5–15.5)
RDW: 14.1 % (ref 11.5–15.5)
WBC: 6.2 10*3/uL (ref 4.0–10.5)
WBC: 6.6 10*3/uL (ref 4.0–10.5)
nRBC: 0 % (ref 0.0–0.2)
nRBC: 0 % (ref 0.0–0.2)

## 2020-01-17 LAB — BASIC METABOLIC PANEL
Anion gap: 6 (ref 5–15)
BUN: 16 mg/dL (ref 8–23)
CO2: 25 mmol/L (ref 22–32)
Calcium: 8.8 mg/dL — ABNORMAL LOW (ref 8.9–10.3)
Chloride: 109 mmol/L (ref 98–111)
Creatinine, Ser: 0.9 mg/dL (ref 0.44–1.00)
GFR calc Af Amer: >60 mL/min (ref >60)
GFR calc non Af Amer: >60 mL/min (ref >60)
Glucose, Bld: 95 mg/dL (ref 70–99)
Potassium: 4.1 mmol/L (ref 3.5–5.1)
Sodium: 140 mmol/L (ref 135–145)

## 2020-01-17 LAB — TSH: TSH: 0.899 u[IU]/mL (ref 0.350–4.500)

## 2020-01-17 LAB — GLUCOSE, CAPILLARY: Glucose-Capillary: 86 mg/dL (ref 70–99)

## 2020-01-17 MED ORDER — LISINOPRIL 5 MG PO TABS: 5.0000 mg | ORAL_TABLET | Freq: Every day | ORAL | 11 refills | Status: DC

## 2020-01-17 MED ORDER — ONDANSETRON HCL 4 MG PO TABS: 4.0000 mg | ORAL_TABLET | Freq: Four times a day (QID) | ORAL | 0 refills | Status: DC | PRN

## 2020-01-17 NOTE — Progress Notes (Signed)
Event monitor placed

## 2020-01-17 NOTE — Progress Notes (Signed)
Physician Discharge Summary  Erica Frank MNO:[id number] DOB: [birth year] DOA: 01/16/2020  PCP: Chilton Greathouse, MD  Admit date: 01/16/2020 Discharge date: 01/17/2020  Admitted From: Home Disposition: Home Recommendations for Outpatient Follow-up:  1. Follow up with PCP in 1-2 weeks 2. Please obtain BMP/CBC in one week 3. Please follow up with Dr. Jacinto Halim for an event monitor next week 4. Please follow-up with Dr. Matthias Hughs and get a Hemoccult card to check blood in stool-  Home Health: None Equipment/Devices none Discharge Condition stable and improved CODE STATUS full code  diet recommendation: Cardiac diet Brief/Interim Summary:78 y.o. female with medical history significant of CAD s/p balloon angioplasty in 1989 and 1990 and NSTEMI s/p stent to mid LAD, paroxysmal atrial fibrillation, HLD, HTN who presents with concerns of syncope.    Patient today was eating lunch at a friend's house and suddenly felt flushing and vomited before she had loss of consciousness.  Thinks she was unconscious for about 2 to 3 minutes.  Denies any loss of bowel or urinary incontinent.  Denies any seizure-like activities.  She had a glass of wine to drink at lunch but this is usual for her.  She states that she was otherwise in her normal state of health.  Although she has noticed that she just looks "unwell" and pale lately. She has had history of syncope in the past due to vertigo and dehydration but she denies any dizziness prior to her episode this time.  Denies any chest pain, palpitation or shortness of breath.  On arrival, she was afebrile, normotensive and occasionally bradycardic down to 50s on room air.  Lab was unremarkable for new anemia with hemoglobin of 8.5 from a prior of 13.7 five months ago. Platelet of 146 from prior normal. BMP with no significant abnormalities.  Patient is on Eliquis for paroxysmal atrial fibrillation and endorsed having black tarry stool here but per ED physician  reported her Hemoccult was negative although not yet documented in chart.  Patient states that about a month ago her Hemoccult was positive at her PCP and she just saw her GI physician on 1/29 and had a repeat Hemoccult that was negative.  Her last colonoscopy was about 2 to 3 years ago with no significant finding. States her stools have changed color although not melena-like and increase in frequency ever since she started taking Repatha for her cholesterol. Denies any nosebleed or bleeding of the oral mucosa.  Patient has vitamin B12 on her medication list but states that she only takes that occasionally.  She denies any significant memory issues or peripheral neuropathy. Patient reports that she has not really eaten much meat due to low appetite from depressed mood due to her husband's passing 2-1/2 years ago.  She has had chronic bradycardia without any symptoms.  She says that ever since her cardiologist adjusted her lisinopril and metoprolol dose she has been having fluctuating blood pressure mostly with issues of hypotension.  This morning she checked BP at home and it was about 106/70.  She also has history of Graves' disease and states that she was never started on medication because she was told that" it was okay."  Denies tobacco or illicit drug use. Denies any family history of any coagulopathy.  Discharge Diagnoses:  Principal Problem:   Syncope Active Problems:   Paroxysmal atrial fibrillation (HCC)   Mixed hyperlipidemia   Bradycardia   Anemia   Thrombocytopenia (HCC)   CAD (coronary artery disease)   #1 syncope likely secondary to  hypotension and bradycardia.  She reports her blood pressure at home was 106/70 and she has been having low BPs at home.  She is on lisinopril, hydrochlorothiazide, and metoprolol at home.  She was not orthostatic on admission.  She is concerned about her low blood pressure.  I have held the metoprolol and HCTZ on discharge and decreased her  lisinopril to 2.5 mg daily.  Her EKG shows sinus bradycardia with no acute changes.  Troponin was normal/negative.  She will follow up with Dr. Jacinto Halim for an event monitor.  Discussed with Dr. Jacinto Halim. She was thought to be acutely anemic on admission with a hemoglobin of 8.5 down from 11.8.  She had no evidence of bleeding.  She reported black tarry stools but per the notes ED physician had checked her stools and was told she was negative for FOBT.  However this is not documented.  She will follow up with Dr. Donavan Burnet outpatient office to get a stool card to check for Hemoccult.  A repeat CBC this morning was 11.9.  And a stat CBC that was repeated 3 hours later showed a hemoglobin of 13.0 and normal platelets at 237.  I do believe her CBC that showed hemoglobin of 8.5 with platelets 146 was not the correct reading.  Patient remained stable during the hospital stay.  #2 paroxysmal atrial fibrillation patient has been bradycardic during the hospital stay rates between 48-53.  Her metoprolol is on hold at the time of discharge. Continue Eliquis  #3 hyperlipidemia continue Vytorin and Repatha  Estimated body mass index is 26.87 kg/m as calculated from the following:   Height as of this encounter: 5' (1.524 m).   Weight as of this encounter: 62.4 kg.  Discharge Instructions   Allergies as of 01/17/2020   No Known Allergies     Medication List    STOP taking these medications   lisinopril-hydrochlorothiazide 20-12.5 MG tablet Commonly known as: ZESTORETIC   metoprolol succinate 25 MG 24 hr tablet Commonly known as: TOPROL-XL     TAKE these medications   acetaminophen 500 MG tablet Commonly known as: TYLENOL Take 500 mg by mouth every 6 (six) hours as needed for mild pain. Up to 4 a day   apixaban 5 MG Tabs tablet Commonly known as: Eliquis TAKE 1 TABLET(5 MG) BY MOUTH TWICE DAILY What changed:   how much to take  how to take this  when to take this  additional instructions    BIOTIN PO Take 1 tablet by mouth at bedtime.   CALCIUM 1200 PO Take 1 tablet by mouth daily as needed (when thinks about it).   co-enzyme Q-10 30 MG capsule Take 30 mg by mouth once a week.   ezetimibe-simvastatin 10-40 MG tablet Commonly known as: VYTORIN TAKE 1 TABLET BY MOUTH DAILY   lisinopril 5 MG tablet Commonly known as: ZESTRIL Take 1 tablet (5 mg total) by mouth daily.   methocarbamol 500 MG tablet Commonly known as: ROBAXIN Take 500 mg by mouth every 6 (six) hours as needed.   MULTIVITAMIN ADULT PO Take 1 tablet by mouth once a week.   nitroGLYCERIN 0.4 MG SL tablet Commonly known as: NITROSTAT Place 1 tablet (0.4 mg total) under the tongue every 5 (five) minutes as needed for chest pain.   ondansetron 4 MG tablet Commonly known as: ZOFRAN Take 1 tablet (4 mg total) by mouth every 6 (six) hours as needed for nausea.   Repatha 140 MG/ML Sosy Generic drug: Evolocumab Inject 1  Syringe into the skin every 14 (fourteen) days.   TURMERIC PO Take 1 tablet by mouth once a week.   VITAMIN B 12 PO Take 1 tablet by mouth once a week.      Follow-up Information    Avva, Ravisankar, MD Follow up.   Specialty: Internal Medicine Contact information: [address] [place] [address] [phone]        Yates Decamp, MD Follow up.   Specialty: Cardiology Why: She needs to be set up with an event monitor next week Contact information: [address] [place] [address] [phone]        Bernette Redbird, MD Follow up.   Specialty: Gastroenterology Contact information: [address] [place] [address] [phone]          No Known Allergies  Consultations:  Discussed with Dr. Jacinto Halim on the phone   Procedures/Studies: CT Head Wo Contrast  Result Date: 01/16/2020 CLINICAL DATA:  Recent syncopal episode EXAM: CT HEAD WITHOUT CONTRAST TECHNIQUE: Contiguous axial images were obtained from the base of the skull  through the vertex without intravenous contrast. COMPARISON:  11/09/2019 FINDINGS: Brain: Bilateral basal ganglia calcifications are seen. No findings to suggest acute hemorrhage, acute infarct or space-occupying mass lesion are noted. Vascular: No hyperdense vessel or unexpected calcification. Skull: Normal. Negative for fracture or focal lesion. Sinuses/Orbits: Mucosal thickening is noted within the right maxillary antrum new from the prior exam. Other: None. IMPRESSION: No acute intracranial abnormality noted. Diffuse mucosal thickening within the right maxillary antrum. Electronically Signed   By: Alcide Clever M.D.   On: 01/16/2020 16:50   DG Chest Port 1 View  Result Date: 01/16/2020 CLINICAL DATA:  Episode of unconsciousness, vomiting EXAM: PORTABLE CHEST 1 VIEW COMPARISON:  07/16/2019 FINDINGS: The heart size and mediastinal contours are within normal limits. Both lungs are clear. The visualized skeletal structures are unremarkable. IMPRESSION: No active disease. Electronically Signed   By: Sharlet Salina M.D.   On: 01/16/2020 16:22    (Echo, Carotid, EGD, Colonoscopy, ERCP)    Subjective: Patient resting in bed denies any complaints anxious to go home no chest pain shortness of breath nausea vomiting diarrhea abdominal pain or headaches  Discharge Exam: Vitals:   01/17/20 1031 01/17/20 1158  BP: 132/75 123/71  Pulse: (!) 50 (!) 53  Resp:  14  Temp:  (!) 97.5 F (36.4 C)  SpO2:  100%   Vitals:   01/17/20 0545 01/17/20 0756 01/17/20 1031 01/17/20 1158  BP: 113/62 (!) 112/59 132/75 123/71  Pulse: (!) 53 (!) 52 (!) 50 (!) 53  Resp: 14 16  14   Temp: 98 F (36.7 C) 98.1 F (36.7 C)  (!) 97.5 F (36.4 C)  TempSrc: Oral Oral  Oral  SpO2: 95% 96%  100%  Weight: 62.4 kg     Height: 5' (1.524 m)       General: Pt is alert, awake, not in acute distress Cardiovascular: RRR, S1/S2 +, no rubs, no gallops Respiratory: CTA bilaterally, no wheezing, no rhonchi Abdominal: Soft, NT, ND,  bowel sounds + Extremities: no edema, no cyanosis    The results of significant diagnostics from this hospitalization (including imaging, microbiology, ancillary and laboratory) are listed below for reference.     Microbiology: Recent Results (from the past 240 hour(s))  Respiratory Panel by RT PCR (Flu A&B, Covid) - Nasopharyngeal Swab     Status: None   Collection Time: 01/16/20  3:56 PM   Specimen: Nasopharyngeal  Swab  Result Value Ref Range Status   SARS Coronavirus 2 by RT PCR NEGATIVE NEGATIVE Final    Comment: (NOTE) SARS-CoV-2 target nucleic acids are NOT DETECTED. The SARS-CoV-2 RNA is generally detectable in upper respiratoy specimens during the acute phase of infection. The lowest concentration of SARS-CoV-2 viral copies this assay can detect is 131 copies/mL. A negative result does not preclude SARS-Cov-2 infection and should not be used as the sole basis for treatment or other patient management decisions. A negative result may occur with  improper specimen collection/handling, submission of specimen other than nasopharyngeal swab, presence of viral mutation(s) within the areas targeted by this assay, and inadequate number of viral copies (<131 copies/mL). A negative result must be combined with clinical observations, patient history, and epidemiological information. The expected result is Negative. Fact Sheet for Patients:  https://www.moore.com/ Fact Sheet for Healthcare Providers:  https://www.young.biz/ This test is not yet ap proved or cleared by the [place] FDA and  has been authorized for detection and/or diagnosis of SARS-CoV-2 by FDA under an Emergency Use Authorization (EUA). This EUA will remain  in effect (meaning this test can be used) for the duration of the COVID-19 declaration under Section 564(b)(1) of the Act, 21 U.S.C. section 360bbb-3(b)(1), unless the authorization is terminated or revoked sooner.     Influenza A by PCR NEGATIVE NEGATIVE Final   Influenza B by PCR NEGATIVE NEGATIVE Final    Comment: (NOTE) The Xpert Xpress SARS-CoV-2/FLU/RSV assay is intended as an aid in  the diagnosis of influenza from Nasopharyngeal swab specimens and  should not be used as a sole basis for treatment. Nasal washings and  aspirates are unacceptable for Xpert Xpress SARS-CoV-2/FLU/RSV  testing. Fact Sheet for Patients: https://www.moore.com/ Fact Sheet for Healthcare Providers: https://www.young.biz/ This test is not yet approved or cleared by the [place] FDA and  has been authorized for detection and/or diagnosis of SARS-CoV-2 by  FDA under an Emergency Use Authorization (EUA). This EUA will remain  in effect (meaning this test can be used) for the duration of the  Covid-19 declaration under Section 564(b)(1) of the Act, 21  U.S.C. section 360bbb-3(b)(1), unless the authorization is  terminated or revoked. Performed at [facility], [address], [place], [place] [address]      Labs: BNP (last 3 results) No results for input(s): BNP in the last 8760 hours. Basic Metabolic Panel: Recent Labs  Lab 01/16/20 1622 01/17/20 0507  NA 139 140  K 4.8 4.1  CL 110 109  CO2 20* 25  GLUCOSE 96 95  BUN 21 16  CREATININE 0.88 0.90  CALCIUM 8.2* 8.8*   Liver Function Tests: Recent Labs  Lab 01/16/20 1622  AST 24  ALT 19  ALKPHOS 47  BILITOT 0.9  PROT 6.0*  ALBUMIN 3.4*   No results for input(s): LIPASE, AMYLASE in the last 168 hours. No results for input(s): AMMONIA in the last 168 hours. CBC: Recent Labs  Lab 01/16/20 1622 01/17/20 0507 01/17/20 1111  WBC 7.0 6.2 6.6  NEUTROABS 5.1  --   --   HGB 8.5* 11.9* 13.0  HCT 28.0* 36.1 40.0  MCV 104.9* 96.0 98.3  PLT 146* 219 237   Cardiac Enzymes: No results for input(s): CKTOTAL, CKMB, CKMBINDEX, TROPONINI in the last 168 hours. BNP: Invalid input(s):  POCBNP CBG: Recent Labs  Lab 01/17/20 0724  GLUCAP 86   D-Dimer No results for input(s): DDIMER in the last 72 hours. Hgb A1c No results for  input(s): HGBA1C in the last 72 hours. Lipid Profile No results for input(s): CHOL, HDL, LDLCALC, TRIG, CHOLHDL, LDLDIRECT in the last 72 hours. Thyroid function studies Recent Labs    01/17/20 0507  TSH 0.899   Anemia work up Recent Labs    01/16/20 1622 01/16/20 1708  VITAMINB12  --  239  FOLATE  --  21.5  FERRITIN  --  53  TIBC  --  324  IRON  --  65  RETICCTPCT 1.6  --    Urinalysis    Component Value Date/Time   COLORURINE YELLOW 01/16/2020 1553   APPEARANCEUR CLEAR 01/16/2020 1553   LABSPEC 1.014 01/16/2020 1553   PHURINE 6.0 01/16/2020 1553   GLUCOSEU NEGATIVE 01/16/2020 1553   HGBUR NEGATIVE 01/16/2020 1553   BILIRUBINUR NEGATIVE 01/16/2020 1553   KETONESUR 5 (A) 01/16/2020 1553   PROTEINUR NEGATIVE 01/16/2020 1553   UROBILINOGEN 0.2 01/24/2015 0542   NITRITE NEGATIVE 01/16/2020 1553   LEUKOCYTESUR NEGATIVE 01/16/2020 1553   Sepsis Labs Invalid input(s): PROCALCITONIN,  WBC,  LACTICIDVEN Microbiology Recent Results (from the past 240 hour(s))  Respiratory Panel by RT PCR (Flu A&B, Covid) - Nasopharyngeal Swab     Status: None   Collection Time: 01/16/20  3:56 PM   Specimen: Nasopharyngeal Swab  Result Value Ref Range Status   SARS Coronavirus 2 by RT PCR NEGATIVE NEGATIVE Final    Comment: (NOTE) SARS-CoV-2 target nucleic acids are NOT DETECTED. The SARS-CoV-2 RNA is generally detectable in upper respiratoy specimens during the acute phase of infection. The lowest concentration of SARS-CoV-2 viral copies this assay can detect is 131 copies/mL. A negative result does not preclude SARS-Cov-2 infection and should not be used as the sole basis for treatment or other patient management decisions. A negative result may occur with  improper specimen collection/handling, submission of specimen other than  nasopharyngeal swab, presence of viral mutation(s) within the areas targeted by this assay, and inadequate number of viral copies (<131 copies/mL). A negative result must be combined with clinical observations, patient history, and epidemiological information. The expected result is Negative. Fact Sheet for Patients:  PinkCheek.be Fact Sheet for Healthcare Providers:  GravelBags.it This test is not yet ap proved or cleared by the [place] FDA and  has been authorized for detection and/or diagnosis of SARS-CoV-2 by FDA under an Emergency Use Authorization (EUA). This EUA will remain  in effect (meaning this test can be used) for the duration of the COVID-19 declaration under Section 564(b)(1) of the Act, 21 U.S.C. section 360bbb-3(b)(1), unless the authorization is terminated or revoked sooner.    Influenza A by PCR NEGATIVE NEGATIVE Final   Influenza B by PCR NEGATIVE NEGATIVE Final    Comment: (NOTE) The Xpert Xpress SARS-CoV-2/FLU/RSV assay is intended as an aid in  the diagnosis of influenza from Nasopharyngeal swab specimens and  should not be used as a sole basis for treatment. Nasal washings and  aspirates are unacceptable for Xpert Xpress SARS-CoV-2/FLU/RSV  testing. Fact Sheet for Patients: PinkCheek.be Fact Sheet for Healthcare Providers: GravelBags.it This test is not yet approved or cleared by the [place] FDA and  has been authorized for detection and/or diagnosis of SARS-CoV-2 by  FDA under an Emergency Use Authorization (EUA). This EUA will remain  in effect (meaning this test can be used) for the duration of the  Covid-19 declaration under Section 564(b)(1) of the Act, 21  U.S.C. section 360bbb-3(b)(1), unless the authorization is  terminated or revoked. Performed at [facility], [address], [place], [place] [address]       Time coordinating discharge:  38 minutes  SIGNED:   Alwyn Ren, MD  [facility] 01/17/2020, 2:14 PM Pager   If 7PM-7AM, please contact night-coverage [email or web address] Password [email or web address]

## 2020-01-17 NOTE — Discharge Instructions (Signed)
Check bp daily and write it down.if your pressure is less than 120/80 dont take bp medications Follow up with dr Jerre Simon office next week for an event monitor

## 2020-01-17 NOTE — Progress Notes (Signed)
This  RN paged NP M. Katherina Right informed that pt HR dropped down to the high 40s when sleeping, asymptomatic no complaints when woken up. Will continue to monitor pt, No new orders at this time.

## 2020-01-18 ENCOUNTER — Ambulatory Visit: Payer: Medicare Other | Attending: Internal Medicine

## 2020-01-18 ENCOUNTER — Other Ambulatory Visit: Payer: Self-pay

## 2020-01-18 DIAGNOSIS — Z23 Encounter for immunization: Secondary | ICD-10-CM | POA: Insufficient documentation

## 2020-01-18 NOTE — Progress Notes (Signed)
   Covid-19 Vaccination Clinic  Name:  Erica Frank    MRN: [id number] DOB: 1940-12-15  01/18/2020  [name] was observed post Covid-19 immunization for 15 minutes without incidence. She was provided with Vaccine Information Sheet and instruction to access the V-Safe system.   [name] was instructed to call 911 with any severe reactions post vaccine: Marland Kitchen Difficulty breathing  . Swelling of your face and throat  . A fast heartbeat  . A bad rash all over your body  . Dizziness and weakness    Immunizations Administered    Name Date Dose VIS Date Route   Pfizer COVID-19 Vaccine 01/18/2020  9:29 AM 0.3 mL 11/16/2019 Intramuscular   Manufacturer: [employer], [employer]   Lot: [id number]   NDC: [id number]

## 2020-01-18 NOTE — Discharge Summary (Signed)
Physician Discharge Summary  Erica Frank ZOX:[id number] DOB: [birth year] DOA: 01/16/2020  PCP: Chilton Greathouse, MD  Admit date: 01/16/2020 Discharge date: 01/18/2020  Admitted From: Home Disposition: Home Recommendations for Outpatient Follow-up:  1. Follow up with PCP in 1-2 weeks 2. Please obtain BMP/CBC in one week 3. Please follow up with Dr. Jacinto Halim for an event monitor next week 4. Please follow-up with Dr. Matthias Hughs and get a Hemoccult card to check blood in stool-  Home Health: None Equipment/Devices none Discharge Condition stable and improved CODE STATUS full code  diet recommendation: Cardiac diet  Brief/Interim Summary:79 y.o.femalewith medical history significant ofCAD s/p balloon angioplasty in 1989 and 1990 and NSTEMI s/p stent to mid LAD, paroxysmal atrial fibrillation, HLD, HTN who presents with concerns ofsyncope.   Patient today was eating lunch at a friend's house and suddenly felt flushing and vomitedbefore she had loss of consciousness. Thinks she was unconscious for about 2 to 3 minutes. Denies any loss of bowel or urinary incontinent. Denies any seizure-like activities. She had a glass of wine to drink at lunch but this is usual for her. She states that she was otherwise in her normal state of health. Although she has noticed that she just looks "unwell" and pale lately. She has had history of syncope in the past due to vertigo and dehydration but she denies any dizziness prior to her episode this time. Denies any chest pain, palpitation or shortness of breath.  On arrival, she was afebrile, normotensive and occasionally bradycardic down to 50s on room air.  Lab was unremarkable for new anemia with hemoglobin of 8.5 from a prior of 13.7 five months ago. Platelet of 146 from prior normal. BMP with no significant abnormalities. Patient is on Eliquis for paroxysmal atrial fibrillationandendorsed having black tarry stool here but per ED physician  reportedher Hemoccult was negative although not yet documented in chart. Patient states that about a month ago her Hemoccult was positive at her PCP and she just saw her GI physician on 1/29and had a repeat Hemoccult that was negative. Her last colonoscopy was about 2 to 3 years ago with no significant finding. States her stools have changed color although not melena-like and increase in frequency ever since she started taking Repatha for her cholesterol.Denies any nosebleed or bleeding of the oral mucosa.  Patient has vitamin B12 on her medication list but states that she only takes that occasionally. She denies any significantmemory issues or peripheral neuropathy. Patient reports that she has not really eaten much meat due to low appetite from depressed mood due to her husband's passing 2-1/2 years ago.  She hashad chronic bradycardia without any symptoms. She says that ever since her cardiologist adjusted her lisinopril and metoprolol dose she has been having fluctuating blood pressure mostly with issues of hypotension. This morning she checked BP at home and it wasabout 106/70.  She also has historyof Graves' disease and states that she was never started on medication because she was told that"it was okay."    Discharge Diagnoses:  Principal Problem:   Syncope Active Problems:   Paroxysmal atrial fibrillation (HCC)   Mixed hyperlipidemia   Bradycardia   Anemia   Thrombocytopenia (HCC)   CAD (coronary artery disease)   #1 syncope likely secondary to hypotension and bradycardia.  She reports her blood pressure at home was 106/70 and she has been having low BPs at home.  She is on lisinopril, hydrochlorothiazide, and metoprolol at home.  She was not orthostatic on admission.  She is concerned about her low blood pressure.  I have held the metoprolol and HCTZ on discharge and decreased her lisinopril to 2.5 mg daily.  Her EKG shows sinus bradycardia with no acute changes.   Troponin was normal/negative.  She will follow up with Dr. Einar Gip for an event monitor.  Discussed with Dr. Einar Gip. She was thought to be acutely anemic on admission with a hemoglobin of 8.5 down from 11.8.  She had no evidence of bleeding.  She reported black tarry stools but per the notes ED physician had checked her stools and was told she was negative for FOBT.  However this is not documented.  She will follow up with Dr. Osborn Coho outpatient office to get a stool card to check for Hemoccult.  A repeat CBC this morning was 11.9.  And a stat CBC that was repeated 3 hours later showed a hemoglobin of 13.0 and normal platelets at 237.  I do believe her CBC that showed hemoglobin of 8.5 with platelets 146 was not the correct reading.  Patient remained stable during the hospital stay.  #2 paroxysmal atrial fibrillation patient has been bradycardic during the hospital stay rates between 48-53.  Her metoprolol is on hold at the time of discharge. Continue Eliquis  #3 hyperlipidemia continue Vytorin and Repatha  Estimated body mass index is 26.87 kg/m as calculated from the following:   Height as of this encounter: 5' (1.524 m).   Weight as of this encounter: 62.4 kg.  Discharge Instructions  Discharge Instructions    Call MD for:  difficulty breathing, headache or visual disturbances   Complete by: As directed    Call MD for:  persistant nausea and vomiting   Complete by: As directed    Diet - low sodium heart healthy   Complete by: As directed    Discharge instructions   Complete by: As directed    Increase activity slowly   Complete by: As directed      Allergies as of 01/17/2020   No Known Allergies     Medication List    STOP taking these medications   lisinopril-hydrochlorothiazide 20-12.5 MG tablet Commonly known as: ZESTORETIC   metoprolol succinate 25 MG 24 hr tablet Commonly known as: TOPROL-XL     TAKE these medications   acetaminophen 500 MG tablet Commonly known  as: TYLENOL Take 500 mg by mouth every 6 (six) hours as needed for mild pain. Up to 4 a day   apixaban 5 MG Tabs tablet Commonly known as: Eliquis TAKE 1 TABLET(5 MG) BY MOUTH TWICE DAILY What changed:   how much to take  how to take this  when to take this  additional instructions   BIOTIN PO Take 1 tablet by mouth at bedtime.   CALCIUM 1200 PO Take 1 tablet by mouth daily as needed (when thinks about it).   co-enzyme Q-10 30 MG capsule Take 30 mg by mouth once a week.   ezetimibe-simvastatin 10-40 MG tablet Commonly known as: VYTORIN TAKE 1 TABLET BY MOUTH DAILY   lisinopril 5 MG tablet Commonly known as: ZESTRIL Take 1 tablet (5 mg total) by mouth daily.   methocarbamol 500 MG tablet Commonly known as: ROBAXIN Take 500 mg by mouth every 6 (six) hours as needed.   MULTIVITAMIN ADULT PO Take 1 tablet by mouth once a week.   nitroGLYCERIN 0.4 MG SL tablet Commonly known as: NITROSTAT Place 1 tablet (0.4 mg total) under the tongue every 5 (five) minutes as needed  for chest pain.   ondansetron 4 MG tablet Commonly known as: ZOFRAN Take 1 tablet (4 mg total) by mouth every 6 (six) hours as needed for nausea.   Repatha 140 MG/ML Sos[place] Generic drug: Evolocumab Inject 1 Syringe into the skin every 14 (fourteen) days.   TURMERIC PO Take 1 tablet by mouth once a week.   VITAMIN B 12 PO Take 1 tablet by mouth once a week.      Follow-up Information    Avva, Ravisankar, MD Follow up.   Specialty: Internal Medicine Contact information: [address] [place] [address] [phone]        Yates Decamp, MD Follow up.   Specialty: Cardiology Why: She needs to be set up with an event monitor next week Contact information: [address] [place] [address] [phone]        Bernette Redbird, MD Follow up.   Specialty: Gastroenterology Contact information: [address] [place] [address] [phone]           No Known Allergies  Consultations: Discussed with Dr. Jacinto Halim on the phone  Procedures/Studies: CT Head Wo Contrast  Result Date: 01/16/2020 CLINICAL DATA:  Recent syncopal episode EXAM: CT HEAD WITHOUT CONTRAST TECHNIQUE: Contiguous axial images were obtained from the base of the skull through the vertex without intravenous contrast. COMPARISON:  11/09/2019 FINDINGS: Brain: Bilateral basal ganglia calcifications are seen. No findings to suggest acute hemorrhage, acute infarct or space-occupying mass lesion are noted. Vascular: No hyperdense vessel or unexpected calcification. Skull: Normal. Negative for fracture or focal lesion. Sinuses/Orbits: Mucosal thickening is noted within the right maxillary antrum new from the prior exam. Other: None. IMPRESSION: No acute intracranial abnormality noted. Diffuse mucosal thickening within the right maxillary antrum. Electronically Signed   By: Alcide Clever M.D.   On: 01/16/2020 16:50   DG Chest Port 1 View  Result Date: 01/16/2020 CLINICAL DATA:  Episode of unconsciousness, vomiting EXAM: PORTABLE CHEST 1 VIEW COMPARISON:  07/16/2019 FINDINGS: The heart size and mediastinal contours are within normal limits. Both lungs are clear. The visualized skeletal structures are unremarkable. IMPRESSION: No active disease. Electronically Signed   By: Sharlet Salina M.D.   On: 01/16/2020 16:22    (Echo, Carotid, EGD, Colonoscopy, ERCP)    Subjective: Patient awake alert anxious to go home no chest pain shortness of breath or headache no palpitations no dizziness walked with staff in the hallway  Discharge Exam: Vitals:   01/17/20 1158 01/17/20 1418  BP: 123/71 113/63  Pulse: (!) 53 (!) 51  Resp: 14 16  Temp: (!) 97.5 F (36.4 C) 98 F (36.7 C)  SpO2: 100% 96%   Vitals:   01/17/20 0756 01/17/20 1031 01/17/20 1158 01/17/20 1418  BP: (!) 112/59 132/75 123/71 113/63  Pulse: (!) 52 (!) 50 (!) 53 (!) 51  Resp: 16  14 16   Temp: 98.1 F (36.7 C)  (!) 97.5  F (36.4 C) 98 F (36.7 C)  TempSrc: Oral  Oral Oral  SpO2: 96%  100% 96%  Weight:      Height:        General: Pt is alert, awake, not in acute distress Cardiovascular: RRR, S1/S2 +, no rubs, no gallops Respiratory: CTA bilaterally, no wheezing, no rhonchi Abdominal: Soft, NT, ND, bowel sounds + Extremities: no edema, no cyanosis    The results of significant diagnostics from this hospitalization (including imaging, microbiology, ancillary and laboratory) are listed below for reference.  Microbiology: Recent Results (from the past 240 hour(s))  Respiratory Panel by RT PCR (Flu A&B, Covid) - Nasopharyngeal Swab     Status: None   Collection Time: 01/16/20  3:56 PM   Specimen: Nasopharyngeal Swab  Result Value Ref Range Status   SARS Coronavirus 2 by RT PCR NEGATIVE NEGATIVE Final    Comment: (NOTE) SARS-CoV-2 target nucleic acids are NOT DETECTED. The SARS-CoV-2 RNA is generally detectable in upper respiratoy specimens during the acute phase of infection. The lowest concentration of SARS-CoV-2 viral copies this assay can detect is 131 copies/mL. A negative result does not preclude SARS-Cov-2 infection and should not be used as the sole basis for treatment or other patient management decisions. A negative result may occur with  improper specimen collection/handling, submission of specimen other than nasopharyngeal swab, presence of viral mutation(s) within the areas targeted by this assay, and inadequate number of viral copies (<131 copies/mL). A negative result must be combined with clinical observations, patient history, and epidemiological information. The expected result is Negative. Fact Sheet for Patients:  https://www.moore.com/ Fact Sheet for Healthcare Providers:  https://www.young.biz/ This test is not yet ap proved or cleared by the [place] FDA and  has been authorized for detection and/or diagnosis of SARS-CoV-2  by FDA under an Emergency Use Authorization (EUA). This EUA will remain  in effect (meaning this test can be used) for the duration of the COVID-19 declaration under Section 564(b)(1) of the Act, 21 U.S.C. section 360bbb-3(b)(1), unless the authorization is terminated or revoked sooner.    Influenza A by PCR NEGATIVE NEGATIVE Final   Influenza B by PCR NEGATIVE NEGATIVE Final    Comment: (NOTE) The Xpert Xpress SARS-CoV-2/FLU/RSV assay is intended as an aid in  the diagnosis of influenza from Nasopharyngeal swab specimens and  should not be used as a sole basis for treatment. Nasal washings and  aspirates are unacceptable for Xpert Xpress SARS-CoV-2/FLU/RSV  testing. Fact Sheet for Patients: https://www.moore.com/ Fact Sheet for Healthcare Providers: https://www.young.biz/ This test is not yet approved or cleared by the [place] FDA and  has been authorized for detection and/or diagnosis of SARS-CoV-2 by  FDA under an Emergency Use Authorization (EUA). This EUA will remain  in effect (meaning this test can be used) for the duration of the  Covid-19 declaration under Section 564(b)(1) of the Act, 21  U.S.C. section 360bbb-3(b)(1), unless the authorization is  terminated or revoked. Performed at [facility], [address] [place], [place] [address]      Labs: BNP (last 3 results) No results for input(s): BNP in the last 8760 hours. Basic Metabolic Panel: Recent Labs  Lab 01/16/20 1622 01/17/20 0507  NA 139 140  K 4.8 4.1  CL 110 109  CO2 20* 25  GLUCOSE 96 95  BUN 21 16  CREATININE 0.88 0.90  CALCIUM 8.2* 8.8*   Liver Function Tests: Recent Labs  Lab 01/16/20 1622  AST 24  ALT 19  ALKPHOS 47  BILITOT 0.9  PROT 6.0*  ALBUMIN 3.4*   No results for input(s): LIPASE, AMYLASE in the last 168 hours. No results for input(s): AMMONIA in the last 168 hours. CBC: Recent Labs  Lab 01/16/20 1622  01/17/20 0507 01/17/20 1111  WBC 7.0 6.2 6.6  NEUTROABS 5.1  --   --   HGB 8.5* 11.9* 13.0  HCT 28.0* 36.1 40.0  MCV 104.9* 96.0 98.3  PLT 146* 219 237   Cardiac Enzymes: No results for input(s): CKTOTAL, CKMB, CKMBINDEX,  TROPONINI in the last 168 hours. BNP: Invalid input(s): POCBNP CBG: Recent Labs  Lab 01/17/20 0724  GLUCAP 86   D-Dimer No results for input(s): DDIMER in the last 72 hours. Hgb A1c No results for input(s): HGBA1C in the last 72 hours. Lipid Profile No results for input(s): CHOL, HDL, LDLCALC, TRIG, CHOLHDL, LDLDIRECT in the last 72 hours. Thyroid function studies Recent Labs    01/17/20 0507  TSH 0.899   Anemia work up Recent Labs    01/16/20 1622 01/16/20 1708  VITAMINB12  --  239  FOLATE  --  21.5  FERRITIN  --  53  TIBC  --  324  IRON  --  65  RETICCTPCT 1.6  --    Urinalysis    Component Value Date/Time   COLORURINE YELLOW 01/16/2020 1553   APPEARANCEUR CLEAR 01/16/2020 1553   LABSPEC 1.014 01/16/2020 1553   PHURINE 6.0 01/16/2020 1553   GLUCOSEU NEGATIVE 01/16/2020 1553   HGBUR NEGATIVE 01/16/2020 1553   BILIRUBINUR NEGATIVE 01/16/2020 1553   KETONESUR 5 (A) 01/16/2020 1553   PROTEINUR NEGATIVE 01/16/2020 1553   UROBILINOGEN 0.2 01/24/2015 0542   NITRITE NEGATIVE 01/16/2020 1553   LEUKOCYTESUR NEGATIVE 01/16/2020 1553   Sepsis Labs Invalid input(s): PROCALCITONIN,  WBC,  LACTICIDVEN Microbiology Recent Results (from the past 240 hour(s))  Respiratory Panel by RT PCR (Flu A&B, Covid) - Nasopharyngeal Swab     Status: None   Collection Time: 01/16/20  3:56 PM   Specimen: Nasopharyngeal Swab  Result Value Ref Range Status   SARS Coronavirus 2 by RT PCR NEGATIVE NEGATIVE Final    Comment: (NOTE) SARS-CoV-2 target nucleic acids are NOT DETECTED. The SARS-CoV-2 RNA is generally detectable in upper respiratoy specimens during the acute phase of infection. The lowest concentration of SARS-CoV-2 viral copies this assay can detect  is 131 copies/mL. A negative result does not preclude SARS-Cov-2 infection and should not be used as the sole basis for treatment or other patient management decisions. A negative result may occur with  improper specimen collection/handling, submission of specimen other than nasopharyngeal swab, presence of viral mutation(s) within the areas targeted by this assay, and inadequate number of viral copies (<131 copies/mL). A negative result must be combined with clinical observations, patient history, and epidemiological information. The expected result is Negative. Fact Sheet for Patients:  https://www.moore.com/ Fact Sheet for Healthcare Providers:  https://www.young.biz/ This test is not yet ap proved or cleared by the [place] FDA and  has been authorized for detection and/or diagnosis of SARS-CoV-2 by FDA under an Emergency Use Authorization (EUA). This EUA will remain  in effect (meaning this test can be used) for the duration of the COVID-19 declaration under Section 564(b)(1) of the Act, 21 U.S.C. section 360bbb-3(b)(1), unless the authorization is terminated or revoked sooner.    Influenza A by PCR NEGATIVE NEGATIVE Final   Influenza B by PCR NEGATIVE NEGATIVE Final    Comment: (NOTE) The Xpert Xpress SARS-CoV-2/FLU/RSV assay is intended as an aid in  the diagnosis of influenza from Nasopharyngeal swab specimens and  should not be used as a sole basis for treatment. Nasal washings and  aspirates are unacceptable for Xpert Xpress SARS-CoV-2/FLU/RSV  testing. Fact Sheet for Patients: https://www.moore.com/ Fact Sheet for Healthcare Providers: https://www.young.biz/ This test is not yet approved or cleared by the [place] FDA and  has been authorized for detection and/or diagnosis of SARS-CoV-2 by  FDA under an Emergency Use Authorization (EUA). This EUA will remain  in effect (  meaning this  test can be used) for the duration of the  Covid-19 declaration under Section 564(b)(1) of the Act, 21  U.S.C. section 360bbb-3(b)(1), unless the authorization is  terminated or revoked. Performed at [facility], [address], [place], [place] [address]      Time coordinating discharge: 39 minutes  SIGNED:   Alwyn Ren, MD  [facility] 01/18/2020, 2:49 PM Pager   If 7PM-7AM, please contact night-coverage [email or web address] Password [email or web address]

## 2020-01-23 DIAGNOSIS — D649 Anemia, unspecified: Secondary | ICD-10-CM | POA: Diagnosis not present

## 2020-01-28 DIAGNOSIS — I119 Hypertensive heart disease without heart failure: Secondary | ICD-10-CM | POA: Diagnosis not present

## 2020-01-28 DIAGNOSIS — R195 Other fecal abnormalities: Secondary | ICD-10-CM | POA: Diagnosis not present

## 2020-01-28 DIAGNOSIS — R001 Bradycardia, unspecified: Secondary | ICD-10-CM | POA: Diagnosis not present

## 2020-01-28 DIAGNOSIS — R55 Syncope and collapse: Secondary | ICD-10-CM | POA: Diagnosis not present

## 2020-02-01 ENCOUNTER — Ambulatory Visit: Payer: Medicare Other | Admitting: Cardiology

## 2020-02-01 ENCOUNTER — Encounter: Payer: Self-pay | Admitting: Cardiology

## 2020-02-01 ENCOUNTER — Other Ambulatory Visit: Payer: Self-pay

## 2020-02-01 VITALS — Temp 97.3°F | Resp 18 | Ht 60.0 in | Wt 136.0 lb

## 2020-02-01 DIAGNOSIS — I25118 Atherosclerotic heart disease of native coronary artery with other forms of angina pectoris: Secondary | ICD-10-CM

## 2020-02-01 DIAGNOSIS — E782 Mixed hyperlipidemia: Secondary | ICD-10-CM

## 2020-02-01 DIAGNOSIS — I480 Paroxysmal atrial fibrillation: Secondary | ICD-10-CM

## 2020-02-01 DIAGNOSIS — R55 Syncope and collapse: Secondary | ICD-10-CM

## 2020-02-01 DIAGNOSIS — I10 Essential (primary) hypertension: Secondary | ICD-10-CM | POA: Diagnosis not present

## 2020-02-01 NOTE — Progress Notes (Signed)
Primary Physician/Referring:  Prince Solian, MD  Patient ID: Erica Frank, female    DOB: March 19, 1941, 79 y.o.   MRN: [id number]  Chief Complaint  Patient presents with  . Paroxysmal Atrial Fibrillation  . Hypertension  . Transitions Of Care    2 week follow up with orthostatics    HPI: Erica Frank  is a 79 y.o. female  with hypertension, hyperlipidemia, and paroxysmal atrial fibrillation on 01/25/2019 S/P cardioversion in the ED, CAD and balloon angioplasty in 1989 and 1990, NSTEMI on 07/09/2019 S/P stenting to the mid LAD.  She was admitted to the hospital on 01/16/2020 and discharged 2 days later with syncope, while eating lunch at friend's house 75 flushing, nauseous and vomited once and had 1 episode of syncope.  No seizure, no incontinence.  She is presently wearing an event monitor and now presents for follow-up.  She was found to be anemic with a hemoglobin of 8.5 but felt to be an error.  No recurrence of syncope and feels well.   Past Medical History:  Diagnosis Date  . Anxiety   . Arthritis   . Atherosclerosis   . Atrial fibrillation ([place])   . Coronary artery disease   . Heart attack ([place])   . Heart disease   . High cholesterol   . Hypertension     Past Surgical History:  Procedure Laterality Date  . ABDOMINAL HYSTERECTOMY  1983   for endometriosis with Burch  . ANGIOPLASTY    . ANGIOPLASTY    . BACK SURGERY    . CARDIAC SURGERY     Catheterization  . CORONARY STENT INTERVENTION N/A 07/09/2019   Procedure: CORONARY STENT INTERVENTION;  Surgeon: Adrian Prows, MD;  Location: [facility];  Service: Cardiovascular;  Laterality: N/A;  . LEFT HEART CATH AND CORONARY ANGIOGRAPHY N/A 07/09/2019   Procedure: LEFT HEART CATH AND CORONARY ANGIOGRAPHY and possible intervention;  Surgeon: Adrian Prows, MD;  Location: [facility];  Service: Cardiovascular;  Laterality: N/A;  4: or 4:30 PM today  . NECK SURGERY     Social History   Tobacco Use  .  Smoking status: Never Smoker  . Smokeless tobacco: Never Used  Substance Use Topics  . Alcohol use: Yes    Comment: Occasional    Family History  Problem Relation Age of Onset  . Uterine cancer Mother   . Hypertension Brother   . Heart disease Brother   . Breast cancer Cousin     Review of Systems  Cardiovascular: Negative for chest pain, dyspnea on exertion and leg swelling.  Gastrointestinal: Negative for melena.   Objective  Temperature (!) 97.3 F (36.3 C), temperature source Temporal, resp. rate 18, height 5' (1.524 m), weight 136 lb (61.7 kg), SpO2 95 %. Body mass index is 26.56 kg/m.  Vitals with BMI 02/01/2020 01/17/2020 01/17/2020  Height _0  - -  Weight 136 lbs - -  BMI 35.00 - -  Systolic - 938 182  Diastolic - 63 71  Pulse - 51 53    Orthostatic VS for the past 72 hrs (Last 3 readings):  Orthostatic BP Patient Position BP Location Cuff Size Orthostatic Pulse  02/01/20 1200 112/73 Standing Left Arm Normal 71  02/01/20 1159 110/67 Sitting Left Arm Normal 63  02/01/20 1154 115/67 Supine Left Arm Normal 56    Physical Exam  Cardiovascular: Normal rate, regular rhythm, normal heart sounds and intact distal pulses. Exam reveals no gallop.  No murmur heard. No  leg edema, no JVD.  Pulmonary/Chest: Effort normal and breath sounds normal.  Abdominal: Soft. Bowel sounds are normal.     Laboratory examination:   11/03/2018:   Glucose 97, BUN/Cr 25/0.9, eGFR 60.9/73.7, Na/K 141/4.6, AST 22, ALT 19, Alk Phos 55, Bil Tot 0.6, Apo B 63. Rest of CMP normal.  Chol 155, Trig 63, HDL 71, LDL 71. TSH Normal.   CMP Latest Ref Rng & Units 01/17/2020 01/16/2020 11/20/2019  Glucose 70 - 99 mg/dL 95 96 90  BUN 8 - 23 mg/dL _0 Creatinine 0.44 - 1.00 mg/dL 0.90 0.88 1.01(H)  Sodium 135 - 145 mmol/L 140 139 140  Potassium 3.5 - 5.1 mmol/L 4.1 4.8 4.1  Chloride 98 - 111 mmol/L 109 110 101  CO2 22 - 32 mmol/L 25 20(L) 25  Calcium 8.9 - 10.3 mg/dL 8.8(L) 8.2(L) 9.4  Total  Protein 6.5 - 8.1 g/dL - 6.0(L) -  Total Bilirubin 0.3 - 1.2 mg/dL - 0.9 -  Alkaline Phos 38 - 126 U/L - 47 -  AST 15 - 41 U/L - 24 -  ALT 0 - 44 U/L - 19 -   CBC Latest Ref Rng & Units 01/17/2020 01/17/2020 01/16/2020  WBC 4.0 - 10.5 K/uL 6.6 6.2 7.0  Hemoglobin 12.0 - 15.0 g/dL 13.0 11.9(L) 8.5(L)  Hematocrit 36.0 - 46.0 % 40.0 36.1 28.0(L)  Platelets 150 - 400 K/uL 237 219 146(L)   Lipid Panel     Component Value Date/Time   CHOL 119 11/20/2019 0920   TRIG 169 (H) 11/20/2019 0920   HDL 61 11/20/2019 0920   LDLCALC 31 11/20/2019 0920   HEMOGLOBIN A1C No results found for: HGBA1C, MPG TSH Recent Labs    01/17/20 0507  TSH 0.899   PRN Meds:. There are no discontinued medications. Current Meds  Medication Sig  . acetaminophen (TYLENOL) 500 MG tablet Take 500 mg by mouth every 6 (six) hours as needed for mild pain. Up to 4 a day  . apixaban (ELIQUIS) 5 MG TABS tablet TAKE 1 TABLET(5 MG) BY MOUTH TWICE DAILY (Patient taking differently: Take 5 mg by mouth 2 (two) times daily. )  . BIOTIN PO Take 1 tablet by mouth at bedtime.  . Calcium Carbonate-Vit D-Min (CALCIUM 1200 PO) Take 1 tablet by mouth daily as needed (when thinks about it).   Marland Kitchen co-enzyme Q-10 30 MG capsule Take 30 mg by mouth once a week.   . Cyanocobalamin (VITAMIN B 12 PO) Take 1 tablet by mouth once a week.   . Evolocumab (REPATHA) 140 MG/ML SOSY Inject 1 Syringe into the skin every 14 (fourteen) days.  Marland Kitchen ezetimibe-simvastatin (VYTORIN) 10-40 MG tablet TAKE 1 TABLET BY MOUTH DAILY  . lisinopril (ZESTRIL) 5 MG tablet Take 1 tablet (5 mg total) by mouth daily.  . methocarbamol (ROBAXIN) 500 MG tablet Take 500 mg by mouth every 6 (six) hours as needed.  . Multiple Vitamins-Minerals (MULTIVITAMIN ADULT PO) Take 1 tablet by mouth once a week.   . nitroGLYCERIN (NITROSTAT) 0.4 MG SL tablet Place 1 tablet (0.4 mg total) under the tongue every 5 (five) minutes as needed for chest pain.  Marland Kitchen ondansetron (ZOFRAN) 4 MG tablet  Take 1 tablet (4 mg total) by mouth every 6 (six) hours as needed for nausea.  . TURMERIC PO Take 1 tablet by mouth once a week.    Cardiac Studies:   Echocardiogram 04/23/2019: Normal LV systolic function with EF 59%. Left ventricle cavity is normal in size.  Normal global wall motion. Calculated EF 59%. Moderate (Grade II) mitral regurgitation. E-wave dominant mitral inflow. Mild tricuspid regurgitation. No evidence of pulmonary hypertension.  Lexiscan myoview stress test 02/26/2019:  1. Lexiscan stress test was performed. Exercise capacity was not assessed. No stress symptoms reported. Resting blood pressure was 110/70 mmHg and peak effect blood pressure was 146/60 mmHg. The resting and stress electrocardiogram demonstrated normal sinus rhythm, normal  conduction, occasional PAC, and normal repolarization.   Stress EKG is non diagnostic for ischemia as it is a pharmacologic stress.  2. The overall quality of the study is good. There is no evidence of abnormal lung activity. Stress and rest SPECT images demonstrate homogeneous tracer distribution throughout the myocardium. Gated SPECT imaging reveals normal myocardial thickening and wall motion. The left ventricular ejection fraction was normal (52%).   3. Low risk study.  Coronary Angiography 07/09/2019: Moderate amount of diffuse coronary calcification noted in all coronary vessels including left main. Right coronary artery and ramus intermediate showed mid 30% stenosis. Mid LAD had a focal moderately calcified high-grade 95% stenosis S/P STENT SYNERGY DES 3X24. Maximum pressure: 16 atm. Inflation time: 75 sec. Stent strut is well apposed. There was residual 0% stenosis with maintenance of TIMI-3 TIMI-3 flow  Coronary Angiography & Balloon Angioplasty of LAD in 1989 and 1990   DCCV 01/25/2019: 120J x 1 to NSR.  Assessment     ICD-10-CM   1. Syncope and collapse  R55   2. Essential hypertension  I10   3. Coronary artery disease of  native artery of native heart with stable angina pectoris ([place])  I25.118   4. Paroxysmal atrial fibrillation ([facility])  I48.0   5. Mixed hyperlipidemia  E78.2     EKG 10/22/2019: Normal sinus rhythm at the rate of 57 bpm, leftward axis, nonspecific T abnormality.  PAC.  No significant change from EKG 07/16/2019, EKG 02/15/2019.  EKG 11/22/2019: Marked sinus bradycardia at 47 bpm, normal axis axis, nonspecific T abnormality.    Recommendations:   Ms. Erica Frank  is a 79 y.o. with hypertension, hyperlipidemia, and paroxysmal atrial fibrillation on 01/25/2019 S/P cardioversion in the ED, CAD and balloon angioplasty in 1989 and 1990, NSTEMI on 07/09/2019 S/P stenting to the mid LAD.  Admitted to the hospital on 01/16/2020, with syncope, presently wearing an event monitor.  No recurrence.  Suspect vasovagal episode.  No orthostatic changes on blood pressure today.  With regard to hypertension, blood pressure is well controlled, lipids are also well controlled she is presently on Vytorin and also Repatha.  Triglycerides are mildly elevated but should improve with continued lifestyle modification.  She maintains sinus rhythm, no recurrence of angina pectoris, I will see her back in 6 months for follow-up. This was a TOC visit for syncope.   Adrian Prows, MD, [facility] 02/01/2020, 12:41 PM [facility]. [place] Office: [phone]

## 2020-02-04 ENCOUNTER — Telehealth: Payer: Self-pay | Admitting: Cardiology

## 2020-02-04 NOTE — Telephone Encounter (Signed)
15 beat run of NSVT. Asymptomatic  Patient has significant coronary disease, will continue to monitor this and if she has recurrence, will consider either repeating a stress test or even consider cardiac catheterization.

## 2020-02-13 ENCOUNTER — Other Ambulatory Visit: Payer: Self-pay | Admitting: Cardiology

## 2020-02-15 DIAGNOSIS — M1711 Unilateral primary osteoarthritis, right knee: Secondary | ICD-10-CM | POA: Diagnosis not present

## 2020-02-15 DIAGNOSIS — M25561 Pain in right knee: Secondary | ICD-10-CM | POA: Diagnosis not present

## 2020-02-20 ENCOUNTER — Telehealth: Payer: Self-pay | Admitting: Cardiology

## 2020-02-27 ENCOUNTER — Other Ambulatory Visit: Payer: Self-pay | Admitting: Cardiology

## 2020-02-27 DIAGNOSIS — I25118 Atherosclerotic heart disease of native coronary artery with other forms of angina pectoris: Secondary | ICD-10-CM

## 2020-03-12 ENCOUNTER — Telehealth: Payer: Self-pay

## 2020-03-12 DIAGNOSIS — E782 Mixed hyperlipidemia: Secondary | ICD-10-CM

## 2020-03-12 MED ORDER — COLESEVELAM HCL 3.75 G PO PACK: 1.0000 | PACK | Freq: Every day | ORAL | 3 refills | Status: DC

## 2020-03-12 NOTE — Telephone Encounter (Signed)
Her lipids will be normal as she is on Repatha. She is already on good dose of statins with Simva and Zetia combination. We could try Colesevelam but big pills and has to take upto 7 tablets daily. It also comes in a sachet and tastes nasty but only one pack per day with OJ if she wants to try

## 2020-03-12 NOTE — Telephone Encounter (Signed)
Sent. Needs lipids in 2 months and orders placed

## 2020-03-12 NOTE — Telephone Encounter (Signed)
Rx for [facility] sent.     ICD-10-CM   1. Mixed hyperlipidemia  E78.2 Colesevelam HCl 3.75 g PACK    Lipid Panel With LDL/HDL Ratio   Will check lipids in 2 months  Yates Decamp, MD, [facility] 03/12/2020, 12:45 PM [facility]. [place] Office: [phone]

## 2020-03-12 NOTE — Telephone Encounter (Signed)
LMOVM informing patient to have labs drawn in 2 months.

## 2020-03-12 NOTE — Telephone Encounter (Signed)
Patient says she wants to try Colesevelam in the sachet form. Can you send this in to her pharmacy please.

## 2020-03-12 NOTE — Telephone Encounter (Signed)
Patient says she is not going to take Repatha any longer giving her stomach issues and makes her feel dizzy and funny taste in mouth. Says she spoke about this with you before.

## 2020-03-14 ENCOUNTER — Other Ambulatory Visit: Payer: Self-pay

## 2020-03-14 ENCOUNTER — Other Ambulatory Visit: Payer: Self-pay | Admitting: Cardiology

## 2020-03-14 ENCOUNTER — Telehealth: Payer: Self-pay

## 2020-03-14 MED ORDER — EZETIMIBE-SIMVASTATIN 10-40 MG PO TABS: 1.0000 | ORAL_TABLET | Freq: Every day | ORAL | 1 refills | Status: DC

## 2020-03-14 MED ORDER — ISOSORBIDE MONONITRATE ER 60 MG PO TB24: 60.0000 mg | ORAL_TABLET | Freq: Every day | ORAL | 3 refills | Status: DC

## 2020-03-14 NOTE — Telephone Encounter (Signed)
Patient says she wants to manage her cholesterol on her own for the next 2 months. Colesevelam is to expensive and she will make an appointment for June have labs drawn and discuss this further with you at her visit.

## 2020-03-17 ENCOUNTER — Telehealth: Payer: Self-pay

## 2020-03-17 NOTE — Telephone Encounter (Signed)
Continue taking the medication and keep track.

## 2020-03-17 NOTE — Telephone Encounter (Signed)
Received a call from patient in regards to blood pressures. Patient states that for the past few days, her blood pressures have been elevated in the a.m. Patient states her pressures have been approx 170/90 and is able to get bp down to approx 140/70 w/ medication. Patient states she was told to call when blood pressure got elevated. Please advise. Thanks!

## 2020-03-21 DIAGNOSIS — M109 Gout, unspecified: Secondary | ICD-10-CM | POA: Diagnosis not present

## 2020-03-21 DIAGNOSIS — I4891 Unspecified atrial fibrillation: Secondary | ICD-10-CM | POA: Diagnosis not present

## 2020-03-21 DIAGNOSIS — I10 Essential (primary) hypertension: Secondary | ICD-10-CM | POA: Diagnosis not present

## 2020-03-21 DIAGNOSIS — R030 Elevated blood-pressure reading, without diagnosis of hypertension: Secondary | ICD-10-CM | POA: Insufficient documentation

## 2020-03-21 DIAGNOSIS — M25561 Pain in right knee: Secondary | ICD-10-CM | POA: Diagnosis not present

## 2020-03-21 DIAGNOSIS — M25562 Pain in left knee: Secondary | ICD-10-CM | POA: Diagnosis not present

## 2020-03-21 DIAGNOSIS — M1711 Unilateral primary osteoarthritis, right knee: Secondary | ICD-10-CM | POA: Diagnosis not present

## 2020-03-21 DIAGNOSIS — M170 Bilateral primary osteoarthritis of knee: Secondary | ICD-10-CM | POA: Diagnosis not present

## 2020-04-09 ENCOUNTER — Other Ambulatory Visit: Payer: Self-pay | Admitting: Internal Medicine

## 2020-04-09 DIAGNOSIS — Z1231 Encounter for screening mammogram for malignant neoplasm of breast: Secondary | ICD-10-CM

## 2020-04-22 DIAGNOSIS — M65332 Trigger finger, left middle finger: Secondary | ICD-10-CM | POA: Diagnosis not present

## 2020-04-29 DIAGNOSIS — E782 Mixed hyperlipidemia: Secondary | ICD-10-CM | POA: Diagnosis not present

## 2020-04-30 LAB — LIPID PANEL WITH LDL/HDL RATIO
Cholesterol, Total: 155 mg/dL (ref 100–199)
HDL: 69 mg/dL (ref >39)
LDL Chol Calc (NIH): 69 mg/dL (ref 0–99)
LDL/HDL Ratio: 1 ratio (ref 0.0–3.2)
Triglycerides: 91 mg/dL (ref 0–149)
VLDL Cholesterol Cal: 17 mg/dL (ref 5–40)

## 2020-05-02 NOTE — Progress Notes (Signed)
Primary Physician/Referring:  Prince Solian, MD  Patient ID: Erica Frank, female    DOB: 04-Aug-1941, 79 y.o.   MRN: [id number]  No chief complaint on file.  HPI:    Erica Frank  is a 79 y.o. female  with hypertension, hyperlipidemia, and paroxysmal atrial fibrillation on 01/25/2019 S/P cardioversion in the ED, CAD and balloon angioplasty in 1989 and 1990, NSTEMI on 07/09/2019 S/P stenting to the mid LAD.   She was admitted to the hospital on 01/16/2020 and discharged 2 days later with syncope, no recurrence of syncope and feels well.  Symptoms were felt to be due to vasovagal episode.  She still continues to have episodes of near syncopal spells especially when she has a bowel movement in the morning.  She continues to have severe arthritis.  She did not tolerate Repatha and has discontinued this and she did not refill WelChol due to cost.  She is presently on Vytorin.  Past Medical History:  Diagnosis Date  . Anxiety   . Arthritis   . Atherosclerosis   . Atrial fibrillation ([place])   . Coronary artery disease   . Heart attack ([place])   . Heart disease   . High cholesterol   . Hypertension    Past Surgical History:  Procedure Laterality Date  . ABDOMINAL HYSTERECTOMY  1983   for endometriosis with Burch  . ANGIOPLASTY    . ANGIOPLASTY    . BACK SURGERY    . CARDIAC SURGERY     Catheterization  . CORONARY STENT INTERVENTION N/A 07/09/2019   Procedure: CORONARY STENT INTERVENTION;  Surgeon: Adrian Prows, MD;  Location: [facility];  Service: Cardiovascular;  Laterality: N/A;  . LEFT HEART CATH AND CORONARY ANGIOGRAPHY N/A 07/09/2019   Procedure: LEFT HEART CATH AND CORONARY ANGIOGRAPHY and possible intervention;  Surgeon: Adrian Prows, MD;  Location: [facility];  Service: Cardiovascular;  Laterality: N/A;  4: or 4:30 PM today  . NECK SURGERY     Family History  Problem Relation Age of Onset  . Uterine cancer Mother   . Hypertension Brother   . Heart  disease Brother   . Breast cancer Cousin     Social History   Tobacco Use  . Smoking status: Never Smoker  . Smokeless tobacco: Never Used  Substance Use Topics  . Alcohol use: Yes    Comment: Occasional   Marital Status: Widowed  ROS  Review of Systems  Cardiovascular: Negative for dyspnea on exertion, leg swelling and syncope.  Musculoskeletal: Positive for arthritis (bilateral knee).  Gastrointestinal: Negative for melena.   Objective  Blood pressure 140/69, pulse (!) 53, resp. rate 15, height '5\' 2"'  (1.575 m), weight 134 lb (60.8 kg), SpO2 95 %.  Vitals with BMI 05/06/2020 02/01/2020 01/17/2020  Height '5\' 2"'  '5\' 0"'  -  Weight 134 lbs 136 lbs -  BMI 56.8 12.75 -  Systolic 170 - 017  Diastolic 69 - 63  Pulse 53 - 51     Physical Exam  Constitutional: She appears well-developed and well-nourished. No distress.  Cardiovascular: Normal rate, regular rhythm and intact distal pulses. Exam reveals no gallop.  No murmur heard. Pulses:      Carotid pulses are on the right side with bruit. No leg edema, no JVD.   Pulmonary/Chest: Effort normal and breath sounds normal. No accessory muscle usage.  Abdominal: Soft. Bowel sounds are normal.   Laboratory examination:   Recent Labs    11/20/19 0920 01/16/20 1622  01/17/20 0507  NA 140 139 140  K 4.1 4.8 4.1  CL 101 110 109  CO2 25 20* 25  GLUCOSE 90 96 95  BUN '20 21 16  ' CREATININE 1.01* 0.88 0.90  CALCIUM 9.4 8.2* 8.8*  GFRNONAA 53* >60 >60  GFRAA 62 >60 >60   CrCl cannot be calculated (Patient's most recent lab result is older than the maximum 21 days allowed.).  CMP Latest Ref Rng & Units 01/17/2020 01/16/2020 11/20/2019  Glucose 70 - 99 mg/dL 95 96 90  BUN 8 - 23 mg/dL '16 21 20  ' Creatinine 0.44 - 1.00 mg/dL 0.90 0.88 1.01(H)  Sodium 135 - 145 mmol/L 140 139 140  Potassium 3.5 - 5.1 mmol/L 4.1 4.8 4.1  Chloride 98 - 111 mmol/L 109 110 101  CO2 22 - 32 mmol/L 25 20(L) 25  Calcium 8.9 - 10.3 mg/dL 8.8(L) 8.2(L) 9.4  Total  Protein 6.5 - 8.1 g/dL - 6.0(L) -  Total Bilirubin 0.3 - 1.2 mg/dL - 0.9 -  Alkaline Phos 38 - 126 U/L - 47 -  AST 15 - 41 U/L - 24 -  ALT 0 - 44 U/L - 19 -   CBC Latest Ref Rng & Units 01/17/2020 01/17/2020 01/16/2020  WBC 4.0 - 10.5 K/uL 6.6 6.2 7.0  Hemoglobin 12.0 - 15.0 g/dL 13.0 11.9(L) 8.5(L)  Hematocrit 36.0 - 46.0 % 40.0 36.1 28.0(L)  Platelets 150 - 400 K/uL 237 219 146(L)   Lipid Panel     Component Value Date/Time   CHOL 155 04/29/2020 0817   TRIG 91 04/29/2020 0817   HDL 69 04/29/2020 0817   LDLCALC 69 04/29/2020 0817   HEMOGLOBIN A1C No results found for: HGBA1C, MPG TSH Recent Labs    01/17/20 0507  TSH 0.899    External labs:   11/03/2018:  Glucose 97, BUN/Cr 25/0.9, eGFR 60.9/73.7, Na/K 141/4.6, AST 22, ALT 19, Alk Phos 55, Bil Tot 0.6, Apo B 63. Rest of CMP normal.  Chol 155, Trig 63, HDL 71, LDL 71. TSH Normal.   Medications and allergies  No Known Allergies   Current Outpatient Medications  Medication Instructions  . acetaminophen (TYLENOL) 500 mg, Oral, Every 6 hours PRN, Up to 4 a day  . apixaban (ELIQUIS) 5 MG TABS tablet TAKE 1 TABLET(5 MG) BY MOUTH TWICE DAILY  . BIOTIN PO 1 tablet, Oral, Daily at bedtime  . Calcium Carbonate-Vit D-Min (CALCIUM 1200 PO) 1 tablet, Oral, Daily PRN  . co-enzyme Q-10 30 mg, Oral, Weekly  . Cyanocobalamin (VITAMIN B 12 PO) 1 tablet, Oral, Weekly  . ezetimibe-simvastatin (VYTORIN) 10-40 MG tablet 1 tablet, Oral, Daily  . isosorbide mononitrate (IMDUR) 60 mg, Oral, Daily  . lisinopril (ZESTRIL) 10 mg, Oral, Daily  . methocarbamol (ROBAXIN) 500 mg, Oral, Every 6 hours PRN  . Multiple Vitamins-Minerals (MULTIVITAMIN ADULT PO) 1 tablet, Oral, Weekly  . nitroGLYCERIN (NITROSTAT) 0.4 mg, Sublingual, Every 5 min PRN  . ondansetron (ZOFRAN) 4 mg, Oral, Every 6 hours PRN  . TURMERIC PO 1 tablet, Oral, Weekly   Radiology:   CT Head Wo Contrast 01/16/2020: No acute intracranial abnormality noted. Diffuse mucosal  thickening within the right maxillary antrum.  Cardiac Studies:   Coronary Angiography & Balloon Angioplasty of LAD in 1989 and 1990.  DCCV 01/25/2019: 120J x 1 to NSR.  Lexiscan myoview stress test 02/26/2019:  1. Lexiscan stress test was performed. Exercise capacity was not assessed. No stress symptoms reported. Resting blood pressure was 110/70 mmHg and peak effect  blood pressure was 146/60 mmHg. The resting and stress electrocardiogram demonstrated normal sinus rhythm, normal  conduction, occasional PAC, and normal repolarization.   Stress EKG is non diagnostic for ischemia as it is a pharmacologic stress.  2. The overall quality of the study is good. There is no evidence of abnormal lung activity. Stress and rest SPECT images demonstrate homogeneous tracer distribution throughout the myocardium. Gated SPECT imaging reveals normal myocardial thickening and wall motion. The left ventricular ejection fraction was normal (52%).   3. Low risk study.  Coronary Angiography 07/09/2019: Moderate amount of diffuse coronary calcification noted in all coronary vessels including left main. Right coronary artery and ramus intermediate showed mid 30% stenosis. Mid LAD had a focal moderately calcified high-grade 95% stenosis S/P STENT SYNERGY DES 3X24. Maximum pressure: 16 atm. Inflation time: 75 sec. Stent strut is well apposed. There was residual 0% stenosis with maintenance of TIMI-3 TIMI-3 flow  Echocardiogram 04/23/2019: Normal LV systolic function with EF 59%. Left ventricle cavity is normal in size. Normal global wall motion. Calculated EF 59%. Moderate (Grade II) mitral regurgitation. E-wave dominant mitral inflow. Mild tricuspid regurgitation. No evidence of pulmonary hypertension.  Event monitor 02/01/2020 - 03/01/2020.  Baseline sample showed Sinus Rhythm w/Artifact with a heart rate of 61.5 bpm. There were 1 critical, 0 serious, and 5 stable events that occurred.  4 patient activated  events were accidental push or no symptoms reported revealed normal sinus rhythm. There was a 11 beat NSVT noted at the rate of 178 bpm on 02/04/2020 at 5:41 AM.  Otherwise no heart block, no atrial fibrillation, rare PVCs noted.   EKG  EKG 05/06/2020: Normal sinus rhythm with rate of 53 bpm, normal axis, nonspecific T wave flattening. No significant change from 10/22/2019\  11/22/2019: Marked sinus bradycardia at 47 bpm, normal axis axis, nonspecific T abnormality.    Assessment     ICD-10-CM   1. Coronary artery disease of native artery of native heart with stable angina pectoris ([place])  I25.118   2. Mixed hyperlipidemia  E78.2   3. Essential hypertension  I10 EKG 12-Lead    lisinopril (ZESTRIL) 10 MG tablet  4. Syncope and collapse  R55   5. Left carotid bruit  R09.89 PCV CAROTID DUPLEX (BILATERAL)  6. Paroxysmal atrial fibrillation ([place]). CHA2DS2-VASc Score is 5.  Yearly risk of stroke: 6.7% (A, F, HTN, Vasc Dz).   I48.0     No orders of the defined types were placed in this encounter.   Medications Discontinued During This Encounter  Medication Reason  . Colesevelam HCl 3.75 g PACK Cost of medication  . Evolocumab (REPATHA) 140 MG/ML SOSY Patient Preference  . lisinopril (ZESTRIL) 5 MG tablet      Recommendations:   Erica Frank  is a 79 y.o. with hypertension, hyperlipidemia, and paroxysmal atrial fibrillation on 01/25/2019 S/P cardioversion in the ED, CAD and balloon angioplasty in 1989 and 1990, NSTEMI on 07/09/2019 S/P stenting to the mid LAD.  Admitted to the hospital on 01/16/2020, with syncope, presently wearing an event monitor.  No recurrence.  Suspect vasovagal episode as she continues to have episodes of near syncopal spells just prior to the bowel movement especially in the morning.  Counterpressure maneuver discussed with the patient.  Event monitor does not reveal anything significant and she has had a low risk stress test.  No recurrence of angina  pectoris.  With regard to hypertension, blood pressure is marginally elevated.  She is relatively sensitive to medications, advised  her to increase lisinopril from 5 mg to 10 mg in the evening.  Lipids are very well controlled on Vytorin 10/40 mg, she did not tolerate Repatha as she developed significant GI issues.  She did not tolerate any other statins as well.  At this point we will continue present management, she will try flaxseed flakes with meals.  She does have faint carotid bruit on the left especially, I will obtain carotid artery duplex.  With regard to atrial fibrillation, she is maintaining sinus rhythm, she has underlying sinus bradycardia without any beta-blocker therapy, presently no indication for pacemaker implantation.  Continue anticoagulation with Eliquis.  Unless carotid artery duplex is markedly abnormal, I will see her back in 1 year.         Adrian Prows, MD, [facility] 05/06/2020, 3:55 PM [facility] [place] Pager: [phone] Office: [phone]

## 2020-05-06 ENCOUNTER — Other Ambulatory Visit: Payer: Self-pay

## 2020-05-06 ENCOUNTER — Encounter: Payer: Self-pay | Admitting: Cardiology

## 2020-05-06 ENCOUNTER — Ambulatory Visit: Payer: Medicare Other | Admitting: Cardiology

## 2020-05-06 VITALS — BP 140/69 | HR 53 | Resp 15 | Ht 62.0 in | Wt 134.0 lb

## 2020-05-06 DIAGNOSIS — E782 Mixed hyperlipidemia: Secondary | ICD-10-CM | POA: Diagnosis not present

## 2020-05-06 DIAGNOSIS — R0989 Other specified symptoms and signs involving the circulatory and respiratory systems: Secondary | ICD-10-CM

## 2020-05-06 DIAGNOSIS — R55 Syncope and collapse: Secondary | ICD-10-CM | POA: Diagnosis not present

## 2020-05-06 DIAGNOSIS — I10 Essential (primary) hypertension: Secondary | ICD-10-CM | POA: Diagnosis not present

## 2020-05-06 DIAGNOSIS — I25118 Atherosclerotic heart disease of native coronary artery with other forms of angina pectoris: Secondary | ICD-10-CM | POA: Diagnosis not present

## 2020-05-06 DIAGNOSIS — I480 Paroxysmal atrial fibrillation: Secondary | ICD-10-CM

## 2020-05-08 ENCOUNTER — Ambulatory Visit: Payer: Medicare Other

## 2020-05-08 ENCOUNTER — Other Ambulatory Visit: Payer: Self-pay

## 2020-05-08 DIAGNOSIS — I6523 Occlusion and stenosis of bilateral carotid arteries: Secondary | ICD-10-CM | POA: Diagnosis not present

## 2020-05-08 DIAGNOSIS — R0989 Other specified symptoms and signs involving the circulatory and respiratory systems: Secondary | ICD-10-CM

## 2020-05-11 ENCOUNTER — Other Ambulatory Visit: Payer: Self-pay | Admitting: Cardiology

## 2020-05-11 DIAGNOSIS — I6523 Occlusion and stenosis of bilateral carotid arteries: Secondary | ICD-10-CM

## 2020-05-12 ENCOUNTER — Other Ambulatory Visit: Payer: Self-pay

## 2020-05-12 ENCOUNTER — Ambulatory Visit
Admission: RE | Admit: 2020-05-12 | Discharge: 2020-05-12 | Disposition: A | Discharge status: Home / Self Care | Payer: Medicare Other | Source: Ambulatory Visit | Attending: Internal Medicine | Admitting: Internal Medicine

## 2020-05-12 DIAGNOSIS — Z1231 Encounter for screening mammogram for malignant neoplasm of breast: Secondary | ICD-10-CM | POA: Diagnosis not present

## 2020-05-13 NOTE — Progress Notes (Signed)
New moderate carotid stenosis. Will discuss on OV soon

## 2020-05-22 ENCOUNTER — Ambulatory Visit: Payer: Medicare Other | Admitting: Cardiology

## 2020-05-28 ENCOUNTER — Telehealth: Payer: Self-pay

## 2020-05-28 NOTE — Telephone Encounter (Signed)
Patient called requesting her Carotid duplex results and also, you had suggested flaxseed "flakes" and she said that she cannot take that, it is ok to take flaxseed pills instead? Please advise.

## 2020-05-30 NOTE — Telephone Encounter (Signed)
There is a 50 to 60% blockage in the right and very mild blockage on the left carotid artery, we will recheck in 6 months.I am fine for her to take flaxseed pills.  Encouraged her to sign up for my chart.

## 2020-06-02 DIAGNOSIS — M25562 Pain in left knee: Secondary | ICD-10-CM | POA: Diagnosis not present

## 2020-06-02 DIAGNOSIS — M25561 Pain in right knee: Secondary | ICD-10-CM | POA: Diagnosis not present

## 2020-06-03 NOTE — Telephone Encounter (Signed)
Called patient, Na, LMAM to call back for results.

## 2020-06-03 NOTE — Telephone Encounter (Signed)
Patient called back, I have discussed results with her and transferred call to FE ([phone]) to schedule duplex in 6 months.

## 2020-06-03 NOTE — Telephone Encounter (Signed)
From patient.

## 2020-06-06 NOTE — Telephone Encounter (Signed)
From patient.

## 2020-06-10 DIAGNOSIS — H5213 Myopia, bilateral: Secondary | ICD-10-CM | POA: Diagnosis not present

## 2020-06-12 DIAGNOSIS — R55 Syncope and collapse: Secondary | ICD-10-CM | POA: Diagnosis not present

## 2020-06-12 DIAGNOSIS — I119 Hypertensive heart disease without heart failure: Secondary | ICD-10-CM | POA: Diagnosis not present

## 2020-06-12 DIAGNOSIS — E785 Hyperlipidemia, unspecified: Secondary | ICD-10-CM | POA: Diagnosis not present

## 2020-06-12 DIAGNOSIS — I4891 Unspecified atrial fibrillation: Secondary | ICD-10-CM | POA: Diagnosis not present

## 2020-06-19 ENCOUNTER — Other Ambulatory Visit: Payer: Self-pay

## 2020-06-19 ENCOUNTER — Emergency Department (HOSPITAL_COMMUNITY)
Admission: EM | Admit: 2020-06-19 | Discharge: 2020-06-19 | Disposition: A | Discharge status: Home / Self Care | Payer: Medicare Other | Attending: Emergency Medicine | Admitting: Emergency Medicine

## 2020-06-19 ENCOUNTER — Encounter (HOSPITAL_COMMUNITY): Payer: Self-pay

## 2020-06-19 DIAGNOSIS — Z5321 Procedure and treatment not carried out due to patient leaving prior to being seen by health care provider: Secondary | ICD-10-CM | POA: Diagnosis not present

## 2020-06-19 DIAGNOSIS — R531 Weakness: Secondary | ICD-10-CM | POA: Insufficient documentation

## 2020-06-19 DIAGNOSIS — R42 Dizziness and giddiness: Secondary | ICD-10-CM | POA: Diagnosis not present

## 2020-06-19 DIAGNOSIS — R110 Nausea: Secondary | ICD-10-CM | POA: Diagnosis not present

## 2020-06-19 LAB — URINALYSIS, ROUTINE W REFLEX MICROSCOPIC
Bacteria, UA: NONE SEEN
Bilirubin Urine: NEGATIVE
Glucose, UA: NEGATIVE mg/dL
Hgb urine dipstick: NEGATIVE
Ketones, ur: NEGATIVE mg/dL
Nitrite: NEGATIVE
Protein, ur: NEGATIVE mg/dL
Specific Gravity, Urine: 1.006 (ref 1.005–1.030)
pH: 6 (ref 5.0–8.0)

## 2020-06-19 LAB — CBC
HCT: 39.2 % (ref 36.0–46.0)
Hemoglobin: 13 g/dL (ref 12.0–15.0)
MCH: 31.9 pg (ref 26.0–34.0)
MCHC: 33.2 g/dL (ref 30.0–36.0)
MCV: 96.3 fL (ref 80.0–100.0)
Platelets: 262 10*3/uL (ref 150–400)
RBC: 4.07 MIL/uL (ref 3.87–5.11)
RDW: 14.5 % (ref 11.5–15.5)
WBC: 6 10*3/uL (ref 4.0–10.5)
nRBC: 0 % (ref 0.0–0.2)

## 2020-06-19 LAB — COMPREHENSIVE METABOLIC PANEL
ALT: 22 U/L (ref 0–44)
AST: 20 U/L (ref 15–41)
Albumin: 3.6 g/dL (ref 3.5–5.0)
Alkaline Phosphatase: 55 U/L (ref 38–126)
Anion gap: 8 (ref 5–15)
BUN: 19 mg/dL (ref 8–23)
CO2: 26 mmol/L (ref 22–32)
Calcium: 9.2 mg/dL (ref 8.9–10.3)
Chloride: 102 mmol/L (ref 98–111)
Creatinine, Ser: 1 mg/dL (ref 0.44–1.00)
GFR calc Af Amer: >60 mL/min (ref >60)
GFR calc non Af Amer: 54 mL/min — ABNORMAL LOW (ref >60)
Glucose, Bld: 106 mg/dL — ABNORMAL HIGH (ref 70–99)
Potassium: 4.4 mmol/L (ref 3.5–5.1)
Sodium: 136 mmol/L (ref 135–145)
Total Bilirubin: 0.3 mg/dL (ref 0.3–1.2)
Total Protein: 6.2 g/dL — ABNORMAL LOW (ref 6.5–8.1)

## 2020-06-19 LAB — LIPASE, BLOOD: Lipase: 31 U/L (ref 11–51)

## 2020-06-19 MED ORDER — SODIUM CHLORIDE 0.9% FLUSH: 3.0000 mL | Freq: Once | INTRAVENOUS | Status: DC

## 2020-06-19 NOTE — ED Triage Notes (Signed)
Pt arrives to ED w/ c/o intermittent BLE and BUE weakness that started approx a month. Pt also reports lightheadedness and mild nausea. Pt denies abdominal pain, vomiting, diarrhea. Pt AOx4, neuro intact.

## 2020-06-19 NOTE — ED Notes (Signed)
Pt leaving AMA. Advised to return if symptoms worsen. 

## 2020-06-21 ENCOUNTER — Telehealth: Payer: Self-pay | Admitting: Cardiology

## 2020-06-21 DIAGNOSIS — I10 Essential (primary) hypertension: Secondary | ICD-10-CM | POA: Diagnosis not present

## 2020-06-21 NOTE — Telephone Encounter (Signed)
Patient called stating her blood pressure was low.  She was reportedly in the ED triage 2 days ago with similar complaints, waited for 5 hours and decided to go home.  She reports occasional lightheadedness, but denies any syncope.  Systolic blood pressure has ranged between 86-106.  She was one still taking lisinopril 5 mg daily.  Most recent office notes actually stated that the lisinopril was discontinued.  I recommended the patient to remain hold lisinopril, keep her hydrated.  We will follow up in the coming week if her symptoms have improved.  Time spent: 10 min

## 2020-06-23 NOTE — Telephone Encounter (Signed)
Do we need to call patient about this message?

## 2020-06-24 NOTE — Telephone Encounter (Signed)
Yes. Need to call and follow up on her blood pressure readings.  Thanks MJP

## 2020-06-26 NOTE — Telephone Encounter (Signed)
Called patient, NA, LMAM to call back.

## 2020-07-08 NOTE — Telephone Encounter (Signed)
Patient aware and will document all BP readings and will callback at the end of the week to give those to [place].

## 2020-07-11 NOTE — Telephone Encounter (Signed)
Patient is aware 

## 2020-07-11 NOTE — Telephone Encounter (Signed)
Looks okay. F/u w/Dr. Jacinto Halim in Dec, unless any new symptoms develop.   Thanks MJP

## 2020-07-11 NOTE — Telephone Encounter (Signed)
07/08/2020  9:30AM - 131/70  53HR 9:35AM - 126/66  54HR 9:40AM -  113/66  55HR  07/09/2020 8:30 - 133/65  50HR 8:36 - 133/63  54HR 8:40 - 105/58  53HR 9PM - 155/79  52HR 9:10 - 137/73  53HR 9:15 - 135/70  53HR  07/10/2020  8AM - 138/76  53HR 8:08 -  124/77  54HR 8:17 -  126/76 54HR 8:30P - 135/63  65HR 8:46P - 120/55  63HR 8:58P - 118/57  63HR  07/11/2020 8AM - 98/59  56HR 8:30 - 116/62  52HR 8:38 - 98/57  54HR 8:45 - 102/60  55HR  9AM - 89/51 54HR 9:10 - 90/50 53HR  Patient stated that she now takes a 1/2 tablet of Lisinopril 10mg  (5mg ) as you directed.

## 2020-07-21 ENCOUNTER — Other Ambulatory Visit: Payer: Self-pay | Admitting: Cardiology

## 2020-07-31 ENCOUNTER — Ambulatory Visit: Payer: Medicare Other | Admitting: Cardiology

## 2020-07-31 DIAGNOSIS — L570 Actinic keratosis: Secondary | ICD-10-CM | POA: Diagnosis not present

## 2020-08-14 IMAGING — DX DG CHEST 2V
2 series · 2 of 2 positions shown · non-contrast
Comparison: 08/06/2016

CLINICAL DATA: Palpitations

EXAM:
CHEST - 2 VIEW

[chest pa]
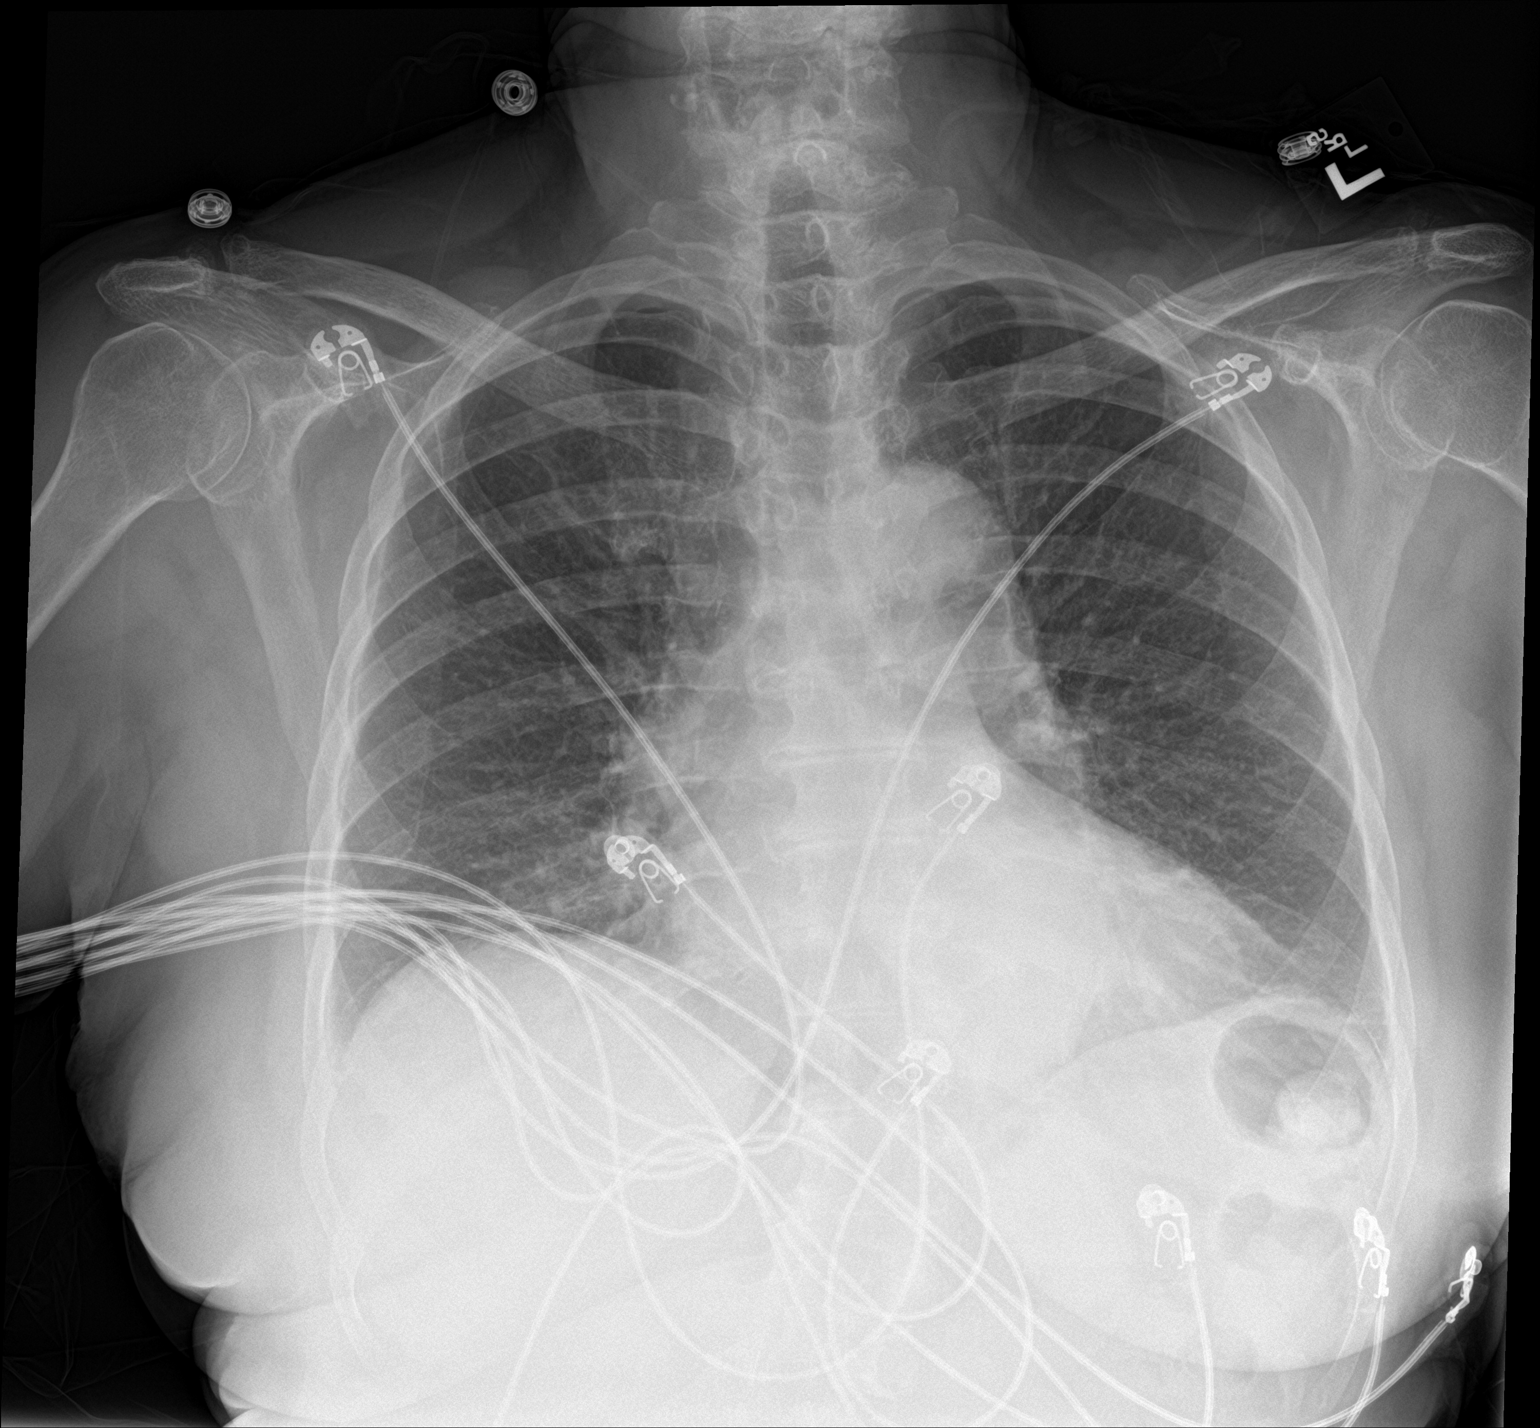

[chest lat]
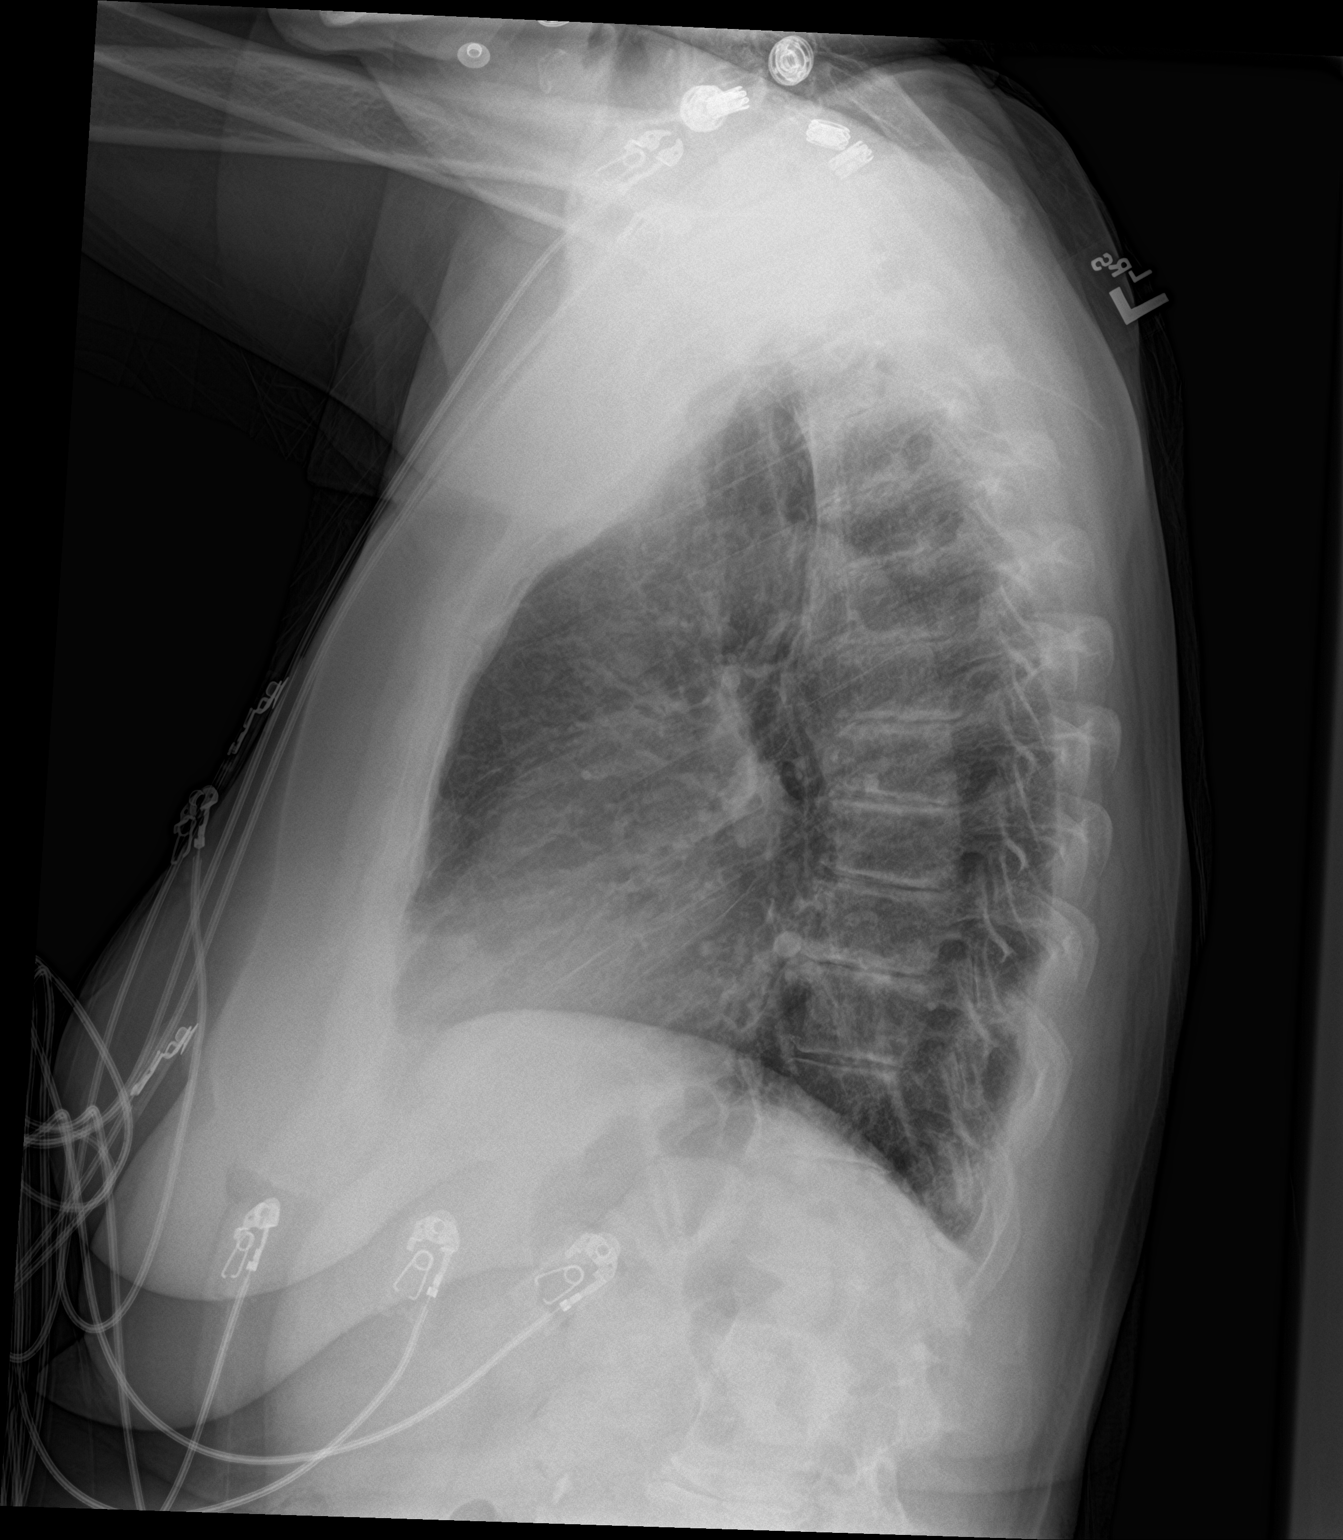

[2 of 2 positions shown; findings below may reference images not displayed]

FINDINGS: Borderline to mild cardiomegaly. No acute consolidation or effusion.
No pneumothorax.
IMPRESSION: No active cardiopulmonary disease.  Borderline cardiomegaly

## 2020-08-19 ENCOUNTER — Encounter (HOSPITAL_COMMUNITY): Payer: Self-pay | Admitting: *Deleted

## 2020-08-19 ENCOUNTER — Other Ambulatory Visit: Payer: Self-pay

## 2020-08-19 ENCOUNTER — Emergency Department (HOSPITAL_COMMUNITY)
Admission: EM | Admit: 2020-08-19 | Discharge: 2020-08-20 | Disposition: A | Discharge status: Home / Self Care | Payer: Medicare Other | Attending: Emergency Medicine | Admitting: Emergency Medicine

## 2020-08-19 DIAGNOSIS — Z7901 Long term (current) use of anticoagulants: Secondary | ICD-10-CM | POA: Diagnosis not present

## 2020-08-19 DIAGNOSIS — Z79899 Other long term (current) drug therapy: Secondary | ICD-10-CM | POA: Insufficient documentation

## 2020-08-19 DIAGNOSIS — I25110 Atherosclerotic heart disease of native coronary artery with unstable angina pectoris: Secondary | ICD-10-CM | POA: Insufficient documentation

## 2020-08-19 DIAGNOSIS — K5732 Diverticulitis of large intestine without perforation or abscess without bleeding: Secondary | ICD-10-CM | POA: Insufficient documentation

## 2020-08-19 DIAGNOSIS — K5792 Diverticulitis of intestine, part unspecified, without perforation or abscess without bleeding: Secondary | ICD-10-CM

## 2020-08-19 DIAGNOSIS — R000 Tachycardia, unspecified: Secondary | ICD-10-CM | POA: Diagnosis not present

## 2020-08-19 DIAGNOSIS — I10 Essential (primary) hypertension: Secondary | ICD-10-CM | POA: Diagnosis not present

## 2020-08-19 DIAGNOSIS — R110 Nausea: Secondary | ICD-10-CM | POA: Diagnosis not present

## 2020-08-19 DIAGNOSIS — R1084 Generalized abdominal pain: Secondary | ICD-10-CM | POA: Diagnosis not present

## 2020-08-19 DIAGNOSIS — R109 Unspecified abdominal pain: Secondary | ICD-10-CM | POA: Diagnosis not present

## 2020-08-19 DIAGNOSIS — K5700 Diverticulitis of small intestine with perforation and abscess without bleeding: Secondary | ICD-10-CM | POA: Diagnosis not present

## 2020-08-19 LAB — CBC
HCT: 44.2 % (ref 36.0–46.0)
Hemoglobin: 14.8 g/dL (ref 12.0–15.0)
MCH: 32.6 pg (ref 26.0–34.0)
MCHC: 33.5 g/dL (ref 30.0–36.0)
MCV: 97.4 fL (ref 80.0–100.0)
Platelets: 276 10*3/uL (ref 150–400)
RBC: 4.54 MIL/uL (ref 3.87–5.11)
RDW: 13.2 % (ref 11.5–15.5)
WBC: 8.7 10*3/uL (ref 4.0–10.5)
nRBC: 0 % (ref 0.0–0.2)

## 2020-08-19 LAB — COMPREHENSIVE METABOLIC PANEL
ALT: 20 U/L (ref 0–44)
AST: 23 U/L (ref 15–41)
Albumin: 4 g/dL (ref 3.5–5.0)
Alkaline Phosphatase: 61 U/L (ref 38–126)
Anion gap: 13 (ref 5–15)
BUN: 18 mg/dL (ref 8–23)
CO2: 21 mmol/L — ABNORMAL LOW (ref 22–32)
Calcium: 9.3 mg/dL (ref 8.9–10.3)
Chloride: 103 mmol/L (ref 98–111)
Creatinine, Ser: 0.81 mg/dL (ref 0.44–1.00)
GFR calc Af Amer: >60 mL/min (ref >60)
GFR calc non Af Amer: >60 mL/min (ref >60)
Glucose, Bld: 118 mg/dL — ABNORMAL HIGH (ref 70–99)
Potassium: 3.8 mmol/L (ref 3.5–5.1)
Sodium: 137 mmol/L (ref 135–145)
Total Bilirubin: 0.7 mg/dL (ref 0.3–1.2)
Total Protein: 6.5 g/dL (ref 6.5–8.1)

## 2020-08-19 LAB — LIPASE, BLOOD: Lipase: 30 U/L (ref 11–51)

## 2020-08-19 NOTE — ED Triage Notes (Signed)
Pt reports onset this am of severe lower abd pain, occ radiates to left side. Denies any n/v/d or urinary symptoms.

## 2020-08-20 ENCOUNTER — Emergency Department (HOSPITAL_COMMUNITY): Payer: Medicare Other

## 2020-08-20 DIAGNOSIS — K5700 Diverticulitis of small intestine with perforation and abscess without bleeding: Secondary | ICD-10-CM | POA: Diagnosis not present

## 2020-08-20 DIAGNOSIS — R000 Tachycardia, unspecified: Secondary | ICD-10-CM | POA: Diagnosis not present

## 2020-08-20 DIAGNOSIS — R109 Unspecified abdominal pain: Secondary | ICD-10-CM | POA: Diagnosis not present

## 2020-08-20 LAB — URINALYSIS, ROUTINE W REFLEX MICROSCOPIC
Bilirubin Urine: NEGATIVE
Glucose, UA: NEGATIVE mg/dL
Hgb urine dipstick: NEGATIVE
Ketones, ur: NEGATIVE mg/dL
Nitrite: NEGATIVE
Protein, ur: NEGATIVE mg/dL
Specific Gravity, Urine: 1.017 (ref 1.005–1.030)
pH: 5 (ref 5.0–8.0)

## 2020-08-20 LAB — LACTIC ACID, PLASMA: Lactic Acid, Venous: 1.2 mmol/L (ref 0.5–1.9)

## 2020-08-20 MED ORDER — IOHEXOL 9 MG/ML PO SOLN
ORAL | Status: AC
  Filled 2020-08-20: qty 1000

## 2020-08-20 MED ORDER — ONDANSETRON HCL 4 MG PO TABS: 4.0000 mg | ORAL_TABLET | Freq: Three times a day (TID) | ORAL | 0 refills | Status: DC | PRN

## 2020-08-20 MED ORDER — FENTANYL CITRATE (PF) 100 MCG/2ML IJ SOLN
50.0000 ug | Freq: Once | INTRAMUSCULAR | Status: AC
  Administered 2020-08-20: 50 ug via INTRAVENOUS
  Filled 2020-08-20: qty 2

## 2020-08-20 MED ORDER — SODIUM CHLORIDE 0.9 % IV SOLN
1.0000 g | Freq: Once | INTRAVENOUS | Status: AC
  Administered 2020-08-20: 1 g via INTRAVENOUS
  Filled 2020-08-20: qty 10

## 2020-08-20 MED ORDER — IOHEXOL 300 MG/ML  SOLN
100.0000 mL | Freq: Once | INTRAMUSCULAR | Status: AC | PRN
  Administered 2020-08-20: 100 mL via INTRAVENOUS

## 2020-08-20 MED ORDER — SODIUM CHLORIDE 0.9 % IV BOLUS
500.0000 mL | Freq: Once | INTRAVENOUS | Status: AC
  Administered 2020-08-20: 500 mL via INTRAVENOUS

## 2020-08-20 MED ORDER — AMOXICILLIN-POT CLAVULANATE 875-125 MG PO TABS: 1.0000 | ORAL_TABLET | Freq: Two times a day (BID) | ORAL | 0 refills | Status: DC

## 2020-08-20 MED ORDER — METRONIDAZOLE IN NACL 5-0.79 MG/ML-% IV SOLN
500.0000 mg | Freq: Once | INTRAVENOUS | Status: AC
  Administered 2020-08-20: 500 mg via INTRAVENOUS
  Filled 2020-08-20: qty 100

## 2020-08-20 MED ORDER — HYDROCODONE-ACETAMINOPHEN 5-325 MG PO TABS: 1.0000 | ORAL_TABLET | ORAL | 0 refills | Status: DC | PRN

## 2020-08-20 MED ORDER — ONDANSETRON HCL 4 MG/2ML IJ SOLN
4.0000 mg | Freq: Once | INTRAMUSCULAR | Status: AC
  Administered 2020-08-20: 4 mg via INTRAVENOUS
  Filled 2020-08-20: qty 2

## 2020-08-20 NOTE — Discharge Instructions (Addendum)
If you develop worsening, continued, or recurrent abdominal pain, uncontrolled vomiting, fever, chest or back pain, or any other new/concerning symptoms then return to the ER for evaluation.  

## 2020-08-20 NOTE — ED Provider Notes (Signed)
Patient is doing well and has tolerated her IV antibiotics without difficulty.  P.o. challenge successfully.  She feels well enough for discharge.  Antibiotics, pain meds, antinausea meds sent in by Dr. Manus Gunning.  Return precautions.   Pricilla Loveless, MD 08/20/20 [phone]

## 2020-08-20 NOTE — ED Provider Notes (Signed)
[facility] EMERGENCY DEPARTMENT Provider Note   CSN: [id number] Arrival date & time: 08/19/20  1023     History Chief Complaint  Patient presents with  . Abdominal Pain    Erica Frank is a 79 y.o. female.  Patient here with left-sided abdominal pain that onset about 1 AM in the morning September 14.  She reports the pain is sharp and constant associated with nausea but no vomiting.  She saw her PCP around 10 AM who referred her to the ED.  Patient has been waiting for approximately 17 hours and still having pain that is starting to improve.  She reports the pain eased off around 2 or 3 PM but is not gone.  She has nausea and dry heaving but no vomiting.  No pain with urination or blood in the urine.  States her bowel movements are decreased compared to her baseline.  Since she was on a cholesterol medication called Repatha several months ago she had frequent bowel movements about 4 times daily but has not had any for the past 2 days.  Her stool has been black in the past but not currently.  She does take Eliquis for history of atrial fibrillation.  No chest pain or shortness of breath.  Intermittently pain radiates to her low back none currently.  Has had no appetite today and try to drink some water but became very nauseous. Only abdominal surgery is hysterectomy.  The history is provided by the patient.  Abdominal Pain Associated symptoms: constipation and nausea   Associated symptoms: no chest pain, no cough, no diarrhea, no dysuria, no fatigue, no fever, no hematuria, no shortness of breath, no vaginal bleeding, no vaginal discharge and no vomiting        Past Medical History:  Diagnosis Date  . Anxiety   . Arthritis   . Atherosclerosis   . Atrial fibrillation (HCC)   . Coronary artery disease   . Heart attack (HCC)   . Heart disease   . High cholesterol   . Hypertension     Patient Active Problem List   Diagnosis Date Noted  . Syncope 01/16/2020   . Anemia 01/16/2020  . Thrombocytopenia (HCC) 01/16/2020  . CAD (coronary artery disease) 01/16/2020  . Elevated troponin 07/09/2019  . NSTEMI (non-ST elevated myocardial infarction) (HCC) 07/09/2019  . Paroxysmal atrial fibrillation (HCC) 02/15/2019  . Mixed hyperlipidemia 02/15/2019  . Bradycardia 02/15/2019  . Coronary artery disease involving native coronary artery of native heart with unstable angina pectoris (HCC) 12/20/2018  . Dyslipidemia 12/20/2018  . Essential hypertension 12/20/2018    Past Surgical History:  Procedure Laterality Date  . ABDOMINAL HYSTERECTOMY  1983   for endometriosis with Burch  . ANGIOPLASTY    . ANGIOPLASTY    . BACK SURGERY    . CARDIAC SURGERY     Catheterization  . CORONARY STENT INTERVENTION N/A 07/09/2019   Procedure: CORONARY STENT INTERVENTION;  Surgeon: Yates Decamp, MD;  Location: [facility] INVASIVE CV LAB;  Service: Cardiovascular;  Laterality: N/A;  . LEFT HEART CATH AND CORONARY ANGIOGRAPHY N/A 07/09/2019   Procedure: LEFT HEART CATH AND CORONARY ANGIOGRAPHY and possible intervention;  Surgeon: Yates Decamp, MD;  Location: [facility] INVASIVE CV LAB;  Service: Cardiovascular;  Laterality: N/A;  4: or 4:30 PM today  . NECK SURGERY       OB History    Gravida  1   Para  1   Term      Preterm  AB      Living  1     SAB      TAB      Ectopic      Multiple      Live Births              Family History  Problem Relation Age of Onset  . Uterine cancer Mother   . Hypertension Brother   . Heart disease Brother   . Breast cancer Cousin     Social History   Tobacco Use  . Smoking status: Never Smoker  . Smokeless tobacco: Never Used  Vaping Use  . Vaping Use: Never used  Substance Use Topics  . Alcohol use: Yes    Comment: Occasional  . Drug use: No    Home Medications Prior to Admission medications   Medication Sig Start Date End Date Taking? Authorizing Provider  acetaminophen (TYLENOL) 500 MG tablet Take 500 mg by  mouth every 6 (six) hours as needed for mild pain. Up to 4 a day    [provider]  BIOTIN PO Take 1 tablet by mouth at bedtime.    [provider]  Calcium Carbonate-Vit D-Min (CALCIUM 1200 PO) Take 1 tablet by mouth daily as needed (when thinks about it).     [provider]  co-enzyme Q-10 30 MG capsule Take 30 mg by mouth once a week.     [provider]  Cyanocobalamin (VITAMIN B 12 PO) Take 1 tablet by mouth once a week.     [provider]  ELIQUIS 5 MG TABS tablet TAKE 1 TABLET(5 MG) BY MOUTH TWICE DAILY 07/21/20   Yates Decamp, MD  ezetimibe-simvastatin (VYTORIN) 10-40 MG tablet Take 1 tablet by mouth daily. 03/14/20   Yates Decamp, MD  isosorbide mononitrate (IMDUR) 60 MG 24 hr tablet Take 1 tablet (60 mg total) by mouth daily. 03/14/20   Yates Decamp, MD  lisinopril (ZESTRIL) 10 MG tablet Take 1 tablet (10 mg total) by mouth daily. 05/06/20   Yates Decamp, MD  methocarbamol (ROBAXIN) 500 MG tablet Take 500 mg by mouth every 6 (six) hours as needed. 11/09/19   [provider]  Multiple Vitamins-Minerals (MULTIVITAMIN ADULT PO) Take 1 tablet by mouth once a week.     [provider]  nitroGLYCERIN (NITROSTAT) 0.4 MG SL tablet Place 1 tablet (0.4 mg total) under the tongue every 5 (five) minutes as needed for chest pain. 12/20/18   Georgeanna Lea, MD  ondansetron (ZOFRAN) 4 MG tablet Take 1 tablet (4 mg total) by mouth every 6 (six) hours as needed for nausea. 01/17/20   Alwyn Ren, MD  TURMERIC PO Take 1 tablet by mouth once a week.     [provider]    Allergies    Patient has no known allergies.  Review of Systems   Review of Systems  Constitutional: Positive for activity change and appetite change. Negative for fatigue and fever.  HENT: Negative for congestion and rhinorrhea.   Eyes: Negative for visual disturbance.  Respiratory: Negative for cough, chest tightness and shortness of breath.   Cardiovascular:  Negative for chest pain.  Gastrointestinal: Positive for abdominal pain, constipation and nausea. Negative for diarrhea and vomiting.  Genitourinary: Negative for dysuria, hematuria, vaginal bleeding and vaginal discharge.  Musculoskeletal: Negative for arthralgias and myalgias.  Skin: Negative for rash.  Neurological: Negative for weakness and headaches.    all other systems are negative except as noted in the HPI  and PMH.   Physical Exam Updated Vital Signs BP (!) 149/73 (BP Location: Right Arm)   Pulse (!) 59   Temp 98.4 F (36.9 C) (Oral)   Resp 16   Ht 5\' 2"  (1.575 m)   Wt 59 kg   SpO2 96%   BMI 23.78 kg/m   Physical Exam Vitals and nursing note reviewed.  Constitutional:      General: She is not in acute distress.    Appearance: She is well-developed.  HENT:     Head: Normocephalic and atraumatic.     Mouth/Throat:     Pharynx: No oropharyngeal exudate.  Eyes:     Conjunctiva/sclera: Conjunctivae normal.     Pupils: Pupils are equal, round, and reactive to light.  Neck:     Comments: No meningismus. Cardiovascular:     Rate and Rhythm: Normal rate and regular rhythm.     Heart sounds: Normal heart sounds. No murmur heard.   Pulmonary:     Effort: Pulmonary effort is normal. No respiratory distress.     Breath sounds: Normal breath sounds.  Abdominal:     Palpations: Abdomen is soft.     Tenderness: There is abdominal tenderness. There is guarding. There is no rebound.     Comments: Periumbilical and LLQ tenderness with guarding. No rebound  Musculoskeletal:        General: No tenderness. Normal range of motion.     Cervical back: Normal range of motion and neck supple.     Comments: No CVAT Equal femoral pulses  Skin:    General: Skin is warm.     Capillary Refill: Capillary refill takes less than 2 seconds.  Neurological:     General: No focal deficit present.     Mental Status: She is alert and oriented to person, place, and time. Mental status is at  baseline.     Cranial Nerves: No cranial nerve deficit.     Motor: No abnormal muscle tone.     Coordination: Coordination normal.     Comments:  5/5 strength throughout. CN 2-12 intact.Equal grip strength.   Psychiatric:        Behavior: Behavior normal.     ED Results / Procedures / Treatments   Labs (all labs ordered are listed, but only abnormal results are displayed) Labs Reviewed  COMPREHENSIVE METABOLIC PANEL - Abnormal; Notable for the following components:      Result Value   CO2 21 (*)    Glucose, Bld 118 (*)    All other components within normal limits  URINALYSIS, ROUTINE W REFLEX MICROSCOPIC - Abnormal; Notable for the following components:   Leukocytes,Ua TRACE (*)    Bacteria, UA RARE (*)    All other components within normal limits  LIPASE, BLOOD  CBC  LACTIC ACID, PLASMA  LACTIC ACID, PLASMA    EKG None  Radiology CT ABDOMEN PELVIS W CONTRAST  Result Date: 08/20/2020 CLINICAL DATA:  Left lower quadrant abdominal pain for 4 days. EXAM: CT ABDOMEN AND PELVIS WITH CONTRAST TECHNIQUE: Multidetector CT imaging of the abdomen and pelvis was performed using the standard protocol following bolus administration of intravenous contrast. CONTRAST:  08/22/2020 OMNIPAQUE IOHEXOL 300 MG/ML  SOLN COMPARISON:  None. FINDINGS: Lower chest: Minimal streaky bibasilar atelectasis. No infiltrates effusions or worrisome pulmonary lesions. Heart is upper limits of normal in size for age. Coronary artery calcifications are noted. Hepatobiliary: No hepatic lesions or intrahepatic biliary dilatation. The gallbladder appears normal. No common bile duct dilatation. Pancreas: No  mass, inflammation or ductal dilatation. Spleen: Normal size. No focal lesions. Adrenals/Urinary Tract: The adrenal glands and kidneys are unremarkable. No worrisome renal lesions, hydronephrosis or evidence of pyelonephritis. The bladder is unremarkable. Stomach/Bowel: Stomach, duodenum and small bowel are unremarkable.  There is acute diverticulitis involving the mid sigmoid colon with wall thickening and moderate inflammatory changes. No complicating features such as free air or abscess. There is a small amount of free pelvic fluid noted. Vascular/Lymphatic: The aorta is normal in caliber. No dissection. The branch vessels are patent. The major venous structures are patent. No mesenteric or retroperitoneal mass or adenopathy. Small scattered lymph nodes are noted. Reproductive: Surgically absent. Other: No pelvic mass or adenopathy. No free pelvic fluid collections. No inguinal mass or adenopathy. No abdominal wall hernia or subcutaneous lesions. Musculoskeletal: No significant bony findings. Advanced degenerative lumbar spondylosis is noted with multilevel disc disease and facet disease. IMPRESSION: 1. Acute uncomplicated diverticulitis involving the mid sigmoid colon. 2. No other significant abdominal/pelvic findings, mass lesions or adenopathy. Electronically Signed   By: Rudie Meyer M.D.   On: 08/20/2020 06:49    Procedures Procedures (including critical care time)  Medications Ordered in ED Medications  fentaNYL (SUBLIMAZE) injection 50 mcg (has no administration in time range)  ondansetron (ZOFRAN) injection 4 mg (has no administration in time range)  sodium chloride 0.9 % bolus 500 mL (has no administration in time range)    ED Course  I have reviewed the triage vital signs and the nursing notes.  Pertinent labs & imaging results that were available during my care of the patient were reviewed by me and considered in my medical decision making (see chart for details).    MDM Rules/Calculators/A&P                         Lowe abdominal pain for >24 hours with nausea.  Vitals are reassuring.  No fever.  Labs obtained in triage are normal.  No leukocytosis.  Lactate normal. UA negative.   Pain improved after medications in the ED.  Her CT scan shows uncomplicated sigmoid diverticulitis.  No abscess  or perforation. IV antibiotics given.   Patient states her nausea and pain are controlled.  She would like to attempt to go home with antibiotics.  Will attempt p.o. challenge.  Discussed need to return to the ED if worsening pain, fever, vomiting, not able to tolerate p.o., any other concerns. Care transferred at shift change with p.o. challenge and antibiotics pending. Final Clinical Impression(s) / ED Diagnoses Final diagnoses:  Diverticulitis    Rx / DC Orders ED Discharge Orders    None       Mariajose Mow, Jeannett Senior, MD 08/20/20 [phone]

## 2020-08-22 DIAGNOSIS — K5792 Diverticulitis of intestine, part unspecified, without perforation or abscess without bleeding: Secondary | ICD-10-CM | POA: Diagnosis not present

## 2020-09-01 DIAGNOSIS — R198 Other specified symptoms and signs involving the digestive system and abdomen: Secondary | ICD-10-CM | POA: Diagnosis not present

## 2020-09-01 DIAGNOSIS — Z7901 Long term (current) use of anticoagulants: Secondary | ICD-10-CM | POA: Diagnosis not present

## 2020-09-01 DIAGNOSIS — K5792 Diverticulitis of intestine, part unspecified, without perforation or abscess without bleeding: Secondary | ICD-10-CM | POA: Diagnosis not present

## 2020-09-01 DIAGNOSIS — R195 Other fecal abnormalities: Secondary | ICD-10-CM | POA: Diagnosis not present

## 2020-09-12 DIAGNOSIS — M170 Bilateral primary osteoarthritis of knee: Secondary | ICD-10-CM | POA: Diagnosis not present

## 2020-09-14 ENCOUNTER — Other Ambulatory Visit: Payer: Self-pay | Admitting: Cardiology

## 2020-09-18 ENCOUNTER — Encounter (HOSPITAL_COMMUNITY): Payer: Self-pay | Admitting: Emergency Medicine

## 2020-09-18 ENCOUNTER — Ambulatory Visit (HOSPITAL_COMMUNITY)
Admission: EM | Admit: 2020-09-18 | Discharge: 2020-09-18 | Disposition: A | Discharge status: Home / Self Care | Payer: Medicare Other | Attending: Family Medicine | Admitting: Family Medicine

## 2020-09-18 ENCOUNTER — Other Ambulatory Visit: Payer: Self-pay

## 2020-09-18 ENCOUNTER — Emergency Department (HOSPITAL_COMMUNITY)
Admission: EM | Admit: 2020-09-18 | Discharge: 2020-09-18 | Disposition: A | Discharge status: Home / Self Care | Payer: Medicare Other | Attending: Emergency Medicine | Admitting: Emergency Medicine

## 2020-09-18 ENCOUNTER — Encounter (HOSPITAL_COMMUNITY): Payer: Self-pay | Admitting: *Deleted

## 2020-09-18 ENCOUNTER — Emergency Department (HOSPITAL_COMMUNITY): Payer: Medicare Other

## 2020-09-18 DIAGNOSIS — I10 Essential (primary) hypertension: Secondary | ICD-10-CM | POA: Diagnosis not present

## 2020-09-18 DIAGNOSIS — I2510 Atherosclerotic heart disease of native coronary artery without angina pectoris: Secondary | ICD-10-CM | POA: Diagnosis not present

## 2020-09-18 DIAGNOSIS — K5792 Diverticulitis of intestine, part unspecified, without perforation or abscess without bleeding: Secondary | ICD-10-CM

## 2020-09-18 DIAGNOSIS — R1032 Left lower quadrant pain: Secondary | ICD-10-CM

## 2020-09-18 DIAGNOSIS — Z79899 Other long term (current) drug therapy: Secondary | ICD-10-CM | POA: Diagnosis not present

## 2020-09-18 DIAGNOSIS — R109 Unspecified abdominal pain: Secondary | ICD-10-CM | POA: Diagnosis not present

## 2020-09-18 DIAGNOSIS — R001 Bradycardia, unspecified: Secondary | ICD-10-CM | POA: Diagnosis not present

## 2020-09-18 DIAGNOSIS — R52 Pain, unspecified: Secondary | ICD-10-CM | POA: Diagnosis not present

## 2020-09-18 DIAGNOSIS — R1084 Generalized abdominal pain: Secondary | ICD-10-CM | POA: Diagnosis not present

## 2020-09-18 LAB — URINALYSIS, ROUTINE W REFLEX MICROSCOPIC
Bilirubin Urine: NEGATIVE
Glucose, UA: NEGATIVE mg/dL
Hgb urine dipstick: NEGATIVE
Ketones, ur: NEGATIVE mg/dL
Leukocytes,Ua: NEGATIVE
Nitrite: NEGATIVE
Protein, ur: NEGATIVE mg/dL
Specific Gravity, Urine: 1.031 — ABNORMAL HIGH (ref 1.005–1.030)
pH: 6 (ref 5.0–8.0)

## 2020-09-18 LAB — COMPREHENSIVE METABOLIC PANEL
ALT: 17 U/L (ref 0–44)
AST: 33 U/L (ref 15–41)
Albumin: 3.5 g/dL (ref 3.5–5.0)
Alkaline Phosphatase: 47 U/L (ref 38–126)
Anion gap: 9 (ref 5–15)
BUN: 15 mg/dL (ref 8–23)
CO2: 23 mmol/L (ref 22–32)
Calcium: 8.8 mg/dL — ABNORMAL LOW (ref 8.9–10.3)
Chloride: 107 mmol/L (ref 98–111)
Creatinine, Ser: 0.94 mg/dL (ref 0.44–1.00)
GFR, Estimated: 58 mL/min — ABNORMAL LOW (ref >60)
Glucose, Bld: 111 mg/dL — ABNORMAL HIGH (ref 70–99)
Potassium: 5 mmol/L (ref 3.5–5.1)
Sodium: 139 mmol/L (ref 135–145)
Total Bilirubin: 1.1 mg/dL (ref 0.3–1.2)
Total Protein: 5.8 g/dL — ABNORMAL LOW (ref 6.5–8.1)

## 2020-09-18 LAB — LACTIC ACID, PLASMA
Lactic Acid, Venous: 1.3 mmol/L (ref 0.5–1.9)
Lactic Acid, Venous: 1.3 mmol/L (ref 0.5–1.9)

## 2020-09-18 LAB — CBC WITH DIFFERENTIAL/PLATELET
Abs Immature Granulocytes: 0.03 10*3/uL (ref 0.00–0.07)
Basophils Absolute: 0.1 10*3/uL (ref 0.0–0.1)
Basophils Relative: 1 %
Eosinophils Absolute: 0.1 10*3/uL (ref 0.0–0.5)
Eosinophils Relative: 1 %
HCT: 40 % (ref 36.0–46.0)
Hemoglobin: 13.3 g/dL (ref 12.0–15.0)
Immature Granulocytes: 0 %
Lymphocytes Relative: 13 %
Lymphs Abs: 1.4 10*3/uL (ref 0.7–4.0)
MCH: 32.5 pg (ref 26.0–34.0)
MCHC: 33.3 g/dL (ref 30.0–36.0)
MCV: 97.8 fL (ref 80.0–100.0)
Monocytes Absolute: 0.6 10*3/uL (ref 0.1–1.0)
Monocytes Relative: 6 %
Neutro Abs: 8.1 10*3/uL — ABNORMAL HIGH (ref 1.7–7.7)
Neutrophils Relative %: 79 %
Platelets: 214 10*3/uL (ref 150–400)
RBC: 4.09 MIL/uL (ref 3.87–5.11)
RDW: 12.8 % (ref 11.5–15.5)
WBC: 10.2 10*3/uL (ref 4.0–10.5)
nRBC: 0 % (ref 0.0–0.2)

## 2020-09-18 LAB — LIPASE, BLOOD: Lipase: 29 U/L (ref 11–51)

## 2020-09-18 MED ORDER — AMOXICILLIN-POT CLAVULANATE 875-125 MG PO TABS: 1.0000 | ORAL_TABLET | Freq: Two times a day (BID) | ORAL | 0 refills | Status: AC

## 2020-09-18 MED ORDER — MORPHINE SULFATE (PF) 2 MG/ML IV SOLN
4.0000 mg | Freq: Once | INTRAVENOUS | Status: AC
  Administered 2020-09-18: 4 mg via INTRAVENOUS

## 2020-09-18 MED ORDER — ONDANSETRON HCL 4 MG PO TABS: 4.0000 mg | ORAL_TABLET | Freq: Three times a day (TID) | ORAL | 0 refills | Status: DC | PRN

## 2020-09-18 MED ORDER — ONDANSETRON 4 MG PO TBDP
4.0000 mg | ORAL_TABLET | Freq: Once | ORAL | Status: AC
  Administered 2020-09-18: 4 mg via ORAL

## 2020-09-18 MED ORDER — MORPHINE SULFATE (PF) 4 MG/ML IV SOLN
4.0000 mg | Freq: Once | INTRAVENOUS | Status: AC
  Administered 2020-09-18: 4 mg via INTRAVENOUS
  Filled 2020-09-18: qty 1

## 2020-09-18 MED ORDER — POLYETHYLENE GLYCOL 3350 17 GM/SCOOP PO POWD: 1.0000 | Freq: Once | ORAL | 0 refills | Status: AC

## 2020-09-18 MED ORDER — ONDANSETRON 4 MG PO TBDP
ORAL_TABLET | ORAL | Status: AC
  Filled 2020-09-18: qty 1

## 2020-09-18 MED ORDER — SODIUM CHLORIDE 0.9 % IV BOLUS
1000.0000 mL | Freq: Once | INTRAVENOUS | Status: AC
  Administered 2020-09-18: 1000 mL via INTRAVENOUS

## 2020-09-18 MED ORDER — IOHEXOL 300 MG/ML  SOLN
100.0000 mL | Freq: Once | INTRAMUSCULAR | Status: AC | PRN
  Administered 2020-09-18: 100 mL via INTRAVENOUS

## 2020-09-18 MED ORDER — MORPHINE SULFATE (PF) 4 MG/ML IV SOLN
INTRAVENOUS | Status: AC
  Filled 2020-09-18: qty 1

## 2020-09-18 NOTE — Discharge Instructions (Addendum)
You are being treated for diverticulitis.  Please take Augmentin, which is your antibiotic once in the morning and once at night for 10 days.  You can start this this evening when you pick up your antibiotics.  You may also take a Zofran tablet for nausea as needed every 8 hours.  For constipation, please take 4 capfuls of MiraLAX daily until you have a bowel movement.  After that, you may take one capful per day if you are still experiencing constipation.  Please return to the emergency department if you have severe worsening of your abdominal pain, repeated vomiting, fever, or any other concerning symptom.

## 2020-09-18 NOTE — ED Triage Notes (Signed)
Pt reports AD pain started yesterday with a recent treatment by PCP and GI MD have treated Pt for diverticulitis . Pt reports she feels like her bowels are locked up. Three weeks ago Pt was ion ED. CT scan of ABD done.

## 2020-09-18 NOTE — ED Notes (Signed)
Attempted to call Ed with report but no answer. EMS radioed in code to ED

## 2020-09-18 NOTE — Discharge Instructions (Addendum)
Meds ordered this encounter  Medications   sodium chloride 0.9 % bolus 1,000 mL   morphine 2 MG/ML injection 4 mg   ondansetron (ZOFRAN-ODT) disintegrating tablet 4 mg

## 2020-09-18 NOTE — ED Provider Notes (Signed)
[facility] EMERGENCY DEPARTMENT Provider Note   CSN: [id number] Arrival date & time: 09/18/20  1527     History Chief Complaint  Patient presents with  . Abdominal Pain    Erica Frank is a 79 y.o. female with history of A. fib, CAD, hypertension, hyperlipidemia who presents with abdominal pain.  She was diagnosed with diverticulitis 1 month ago and finished her antibiotics.  Over the last couple days, she has had intermittent abdominal pain that has severely worsened today and no bowel movement in 2 to 3 days.  She does report a sensation of having to have a bowel movement and pain and pressure in her lower abdomen.  She states this feels similar to her bout of diverticulitis last month, but was having diarrhea with that instead of constipation.  Has not had much to eat or drink today because makes her stomach pain worse.  Has been nauseous, but no emesis.  Patient had a hysterectomy in the past, but no other abdominal surgeries.  Denies dysuria, bloody bowel movements, fevers, or shortness of breath.  Was seen in urgent care earlier today, received morphine, fluids, Zofran.   Abdominal Pain Pain location:  Generalized Pain radiates to:  Does not radiate Pain severity:  Severe Onset quality:  Gradual Timing:  Constant Progression:  Worsening Chronicity:  Recurrent Relieved by:  Nothing Worsened by:  Palpation and eating Associated symptoms: anorexia, constipation and nausea   Associated symptoms: no chest pain, no chills, no cough, no diarrhea, no dysuria, no fever, no hematuria, no shortness of breath, no sore throat and no vomiting        Past Medical History:  Diagnosis Date  . Anxiety   . Arthritis   . Atherosclerosis   . Atrial fibrillation (HCC)   . Coronary artery disease   . Heart attack (HCC)   . Heart disease   . High cholesterol   . Hypertension     Patient Active Problem List   Diagnosis Date Noted  . Syncope 01/16/2020  .  Anemia 01/16/2020  . Thrombocytopenia (HCC) 01/16/2020  . CAD (coronary artery disease) 01/16/2020  . Elevated troponin 07/09/2019  . NSTEMI (non-ST elevated myocardial infarction) (HCC) 07/09/2019  . Paroxysmal atrial fibrillation (HCC) 02/15/2019  . Mixed hyperlipidemia 02/15/2019  . Bradycardia 02/15/2019  . Coronary artery disease involving native coronary artery of native heart with unstable angina pectoris (HCC) 12/20/2018  . Dyslipidemia 12/20/2018  . Essential hypertension 12/20/2018    Past Surgical History:  Procedure Laterality Date  . ABDOMINAL HYSTERECTOMY  1983   for endometriosis with Burch  . ANGIOPLASTY    . ANGIOPLASTY    . BACK SURGERY    . CARDIAC SURGERY     Catheterization  . CORONARY STENT INTERVENTION N/A 07/09/2019   Procedure: CORONARY STENT INTERVENTION;  Surgeon: Yates Decamp, MD;  Location: [facility] INVASIVE CV LAB;  Service: Cardiovascular;  Laterality: N/A;  . LEFT HEART CATH AND CORONARY ANGIOGRAPHY N/A 07/09/2019   Procedure: LEFT HEART CATH AND CORONARY ANGIOGRAPHY and possible intervention;  Surgeon: Yates Decamp, MD;  Location: [facility] INVASIVE CV LAB;  Service: Cardiovascular;  Laterality: N/A;  4: or 4:30 PM today  . NECK SURGERY       OB History    Gravida  1   Para  1   Term      Preterm      AB      Living  1     SAB  TAB      Ectopic      Multiple      Live Births              Family History  Problem Relation Age of Onset  . Uterine cancer Mother   . Hypertension Brother   . Heart disease Brother   . Breast cancer Cousin     Social History   Tobacco Use  . Smoking status: Never Smoker  . Smokeless tobacco: Never Used  Vaping Use  . Vaping Use: Never used  Substance Use Topics  . Alcohol use: Yes    Comment: Occasional  . Drug use: No    Home Medications Prior to Admission medications   Medication Sig Start Date End Date Taking? Authorizing Provider  BIOTIN PO Take 1 tablet by mouth at bedtime.   Yes [provider]  Calcium Carbonate-Vit D-Min (CALCIUM 1200 PO) Take 1 tablet by mouth daily.    Yes [provider]  Coenzyme Q10 100 MG capsule Take 100 mg by mouth daily.    Yes [provider]  Cyanocobalamin (VITAMIN B 12 PO) Take 1 tablet by mouth daily.    Yes [provider]  ELIQUIS 5 MG TABS tablet TAKE 1 TABLET(5 MG) BY MOUTH TWICE DAILY Patient taking differently: Take 5 mg by mouth 2 (two) times daily.  07/21/20  Yes Yates Decamp, MD  ezetimibe-simvastatin (VYTORIN) 10-40 MG tablet TAKE 1 TABLET BY MOUTH DAILY 09/15/20  Yes Yates Decamp, MD  isosorbide mononitrate (IMDUR) 60 MG 24 hr tablet Take 1 tablet (60 mg total) by mouth daily. 03/14/20  Yes Yates Decamp, MD  lisinopril (ZESTRIL) 5 MG tablet Take 5 mg by mouth daily.  05/06/20  Yes Yates Decamp, MD  Multiple Vitamins-Minerals (MULTIVITAMIN ADULT PO) Take 1 tablet by mouth daily.    Yes [provider]  nitroGLYCERIN (NITROSTAT) 0.4 MG SL tablet Place 1 tablet (0.4 mg total) under the tongue every 5 (five) minutes as needed for chest pain. 12/20/18  Yes Georgeanna Lea, MD  TURMERIC PO Take 1 tablet by mouth once a week.    Yes [provider]  amoxicillin-clavulanate (AUGMENTIN) 875-125 MG tablet Take 1 tablet by mouth every 12 (twelve) hours for 10 days. 09/18/20 09/28/20  Allayne Butcher, MD  HYDROcodone-acetaminophen (NORCO/VICODIN) 5-325 MG tablet Take 1 tablet by mouth every 4 (four) hours as needed for moderate pain. Patient not taking: Reported on 09/18/2020 08/20/20   Glynn Octave, MD  ondansetron (ZOFRAN) 4 MG tablet Take 1 tablet (4 mg total) by mouth every 8 (eight) hours as needed for nausea or vomiting. 09/18/20   Allayne Butcher, MD  polyethylene glycol powder (GLYCOLAX/MIRALAX) 17 GM/SCOOP powder Take 255 g by mouth once for 1 dose. 09/18/20 09/18/20  Allayne Butcher, MD    Allergies    Patient has no known allergies.  Review of Systems   Review of Systems  Constitutional:  Negative for chills and fever.  HENT: Negative for ear pain and sore throat.   Eyes: Negative for pain and visual disturbance.  Respiratory: Negative for cough and shortness of breath.   Cardiovascular: Negative for chest pain and palpitations.  Gastrointestinal: Positive for abdominal pain, anorexia, constipation and nausea. Negative for diarrhea and vomiting.  Genitourinary: Negative for dysuria and hematuria.  Musculoskeletal: Negative for arthralgias and back pain.  Skin: Negative for color change and rash.  Neurological: Negative for seizures and syncope.  All other systems reviewed and are negative.  Physical Exam Updated Vital Signs BP 139/78   Pulse 66   Temp 99 F (37.2 C) (Oral)   Resp (!) 22   Ht 5\' 2"  (1.575 m)   Wt 59 kg   SpO2 94%   BMI 23.78 kg/m   Physical Exam Vitals and nursing note reviewed.  Constitutional:      General: She is not in acute distress.    Appearance: She is well-developed.     Comments: Appears uncomfortable and in pain.  HENT:     Head: Normocephalic and atraumatic.  Eyes:     Conjunctiva/sclera: Conjunctivae normal.  Cardiovascular:     Rate and Rhythm: Normal rate and regular rhythm.     Heart sounds: No murmur heard.   Pulmonary:     Effort: Pulmonary effort is normal. No respiratory distress.     Breath sounds: Normal breath sounds.  Abdominal:     Palpations: Abdomen is soft.     Tenderness: There is generalized abdominal tenderness (worse in lower abdomen). There is guarding. There is no rebound.  Musculoskeletal:     Cervical back: Neck supple.  Skin:    General: Skin is warm and dry.  Neurological:     General: No focal deficit present.     Mental Status: She is alert and oriented to person, place, and time.     ED Results / Procedures / Treatments   Labs (all labs ordered are listed, but only abnormal results are displayed) Labs Reviewed  CBC WITH DIFFERENTIAL/PLATELET - Abnormal; Notable for the following  components:      Result Value   Neutro Abs 8.1 (*)    All other components within normal limits  COMPREHENSIVE METABOLIC PANEL - Abnormal; Notable for the following components:   Glucose, Bld 111 (*)    Calcium 8.8 (*)    Total Protein 5.8 (*)    GFR, Estimated 58 (*)    All other components within normal limits  URINALYSIS, ROUTINE W REFLEX MICROSCOPIC - Abnormal; Notable for the following components:   Specific Gravity, Urine 1.031 (*)    All other components within normal limits  LIPASE, BLOOD  LACTIC ACID, PLASMA  LACTIC ACID, PLASMA    EKG None  Radiology CT ABDOMEN PELVIS W CONTRAST  Result Date: 09/18/2020 CLINICAL DATA:  Abdominal pain with history of diverticulitis EXAM: CT ABDOMEN AND PELVIS WITH CONTRAST TECHNIQUE: Multidetector CT imaging of the abdomen and pelvis was performed using the standard protocol following bolus administration of intravenous contrast. CONTRAST:  09/20/2020 OMNIPAQUE IOHEXOL 300 MG/ML  SOLN COMPARISON:  08/21/2019 FINDINGS: Lower chest: No acute abnormality. Hepatobiliary: Mild fatty infiltration of the liver is noted. The gallbladder is within normal limits. Pancreas: Unremarkable. No pancreatic ductal dilatation or surrounding inflammatory changes. Spleen: Normal in size without focal abnormality. Adrenals/Urinary Tract: Adrenal glands are within normal limits. Kidneys demonstrate a normal enhancement pattern bilaterally. No renal calculi or obstructive changes are seen. Delayed images demonstrate normal excretion of contrast. The bladder is well distended. Stomach/Bowel: Scattered diverticular changes noted throughout the colon. Minimal residual inflammatory change consistent with diverticulitis is noted although improved from the prior exam. Some prominent fecal material is noted within the sigmoid. No obstructive changes are noted proximally. The appendix is well visualized and within normal limits. No small bowel or gastric abnormality is seen.  Vascular/Lymphatic: Aortic atherosclerosis. No enlarged abdominal or pelvic lymph nodes. Reproductive: Status post hysterectomy. No adnexal masses. Other: No abdominal wall hernia or abnormality. No abdominopelvic ascites. Musculoskeletal: Degenerative changes  are noted. No acute bony abnormality is seen. IMPRESSION: Persistent but improved diverticulitis in the sigmoid colon. Prominent fecal material in the sigmoid is noted which may contribute to the patient's discomfort. No evidence of perforation or focal abscess is seen. Electronically Signed   By: Alcide Clever M.D.   On: 09/18/2020 18:54    Procedures Procedures (including critical care time)  Medications Ordered in ED Medications  morphine 4 MG/ML injection 4 mg (4 mg Intravenous Given 09/18/20 1637)  iohexol (OMNIPAQUE) 300 MG/ML solution 100 mL (100 mLs Intravenous Contrast Given 09/18/20 1824)    ED Course  I have reviewed the triage vital signs and the nursing notes.  Pertinent labs & imaging results that were available during my care of the patient were reviewed by me and considered in my medical decision making (see chart for details).  Clinical Course as of Sep 18 2354  Thu Sep 18, 2020  1541 Reports this is around baseline.  Pulse Rate(!): 51 [BH]    Clinical Course User Index [BH] Mardella Layman, MD   MDM Rules/Calculators/A&P                          CBC, CMP, lactic acid, lipase unremarkable.  UA without infection.  Patient received 1 L fluids and morphine at urgent care, and refused additional morphine here.  CT abdomen pelvis noted persistent but improved diverticulitis in the sigmoid colon and prominent fecal material in the sigmoid as well.  No evidence of perforation, abscess, or obstruction seen.  Patient's week of good health in between her current symptoms and prior symptoms, I suspect this is a new diverticulitis flare.  Discussed options with patient, who opted for outpatient management with p.o. antibiotics  and MiraLAX.  I feel this is appropriate.  Patient has a daughter who is a nurse who will be staying with her.  Return precautions given.  Patient is stable for discharge at this time.  This patient was seen with Dr. Dalene Seltzer. Final Clinical Impression(s) / ED Diagnoses Final diagnoses:  Diverticulitis    Rx / DC Orders ED Discharge Orders         Ordered    amoxicillin-clavulanate (AUGMENTIN) 875-125 MG tablet  Every 12 hours        09/18/20 2018    ondansetron (ZOFRAN) 4 MG tablet  Every 8 hours PRN        09/18/20 2018    polyethylene glycol powder (GLYCOLAX/MIRALAX) 17 GM/SCOOP powder   Once        09/18/20 2018           Allayne Butcher, MD 09/18/20 2356    Alvira Monday, MD 09/21/20 [phone]

## 2020-09-18 NOTE — ED Provider Notes (Signed)
[facility] CARE CENTER   [id number] 09/18/20 Arrival Time: 1417  ASSESSMENT & PLAN:  1. Left lower quadrant abdominal pain    To ED via EMS. Stable upon discharge.   Clinical Course as of Sep 19 1543  Thu Sep 18, 2020  1543 Around her baseline.  Pulse Rate(!): 52 [BH]    Clinical Course User Index [BH] Mardella Layman, MD    Meds ordered this encounter  Medications   sodium chloride 0.9 % bolus 1,000 mL   morphine 2 MG/ML injection 4 mg   ondansetron (ZOFRAN-ODT) disintegrating tablet 4 mg     Follow-up Information    [facility] EMERGENCY DEPARTMENT.   Specialty: Emergency Medicine Why: Via EMS. Contact information: [address] [id number] mc [place] [address] [phone]               Reviewed expectations re: course of current medical issues. Questions answered. Outlined signs and symptoms indicating need for more acute intervention. Patient verbalized understanding. After Visit Summary given.   SUBJECTIVE: History from: patient. Seen in triage with RN. Erica Frank is a 79 y.o. female who was seen in ED on 08/20/20. Dx with diverticulitis via CT.  Reports finishing antibiotics. Past couple of days with increasing LLQ pain; "severe" today. Mild nausea. Non-bloody BM 2-3 d ago. Ambulatory but feels unstable. No fever reported. Overall decreased PO intake. No CP/SOB.  No LMP recorded. Patient has had a hysterectomy.   Past Surgical History:  Procedure Laterality Date   ABDOMINAL HYSTERECTOMY  1983   for endometriosis with Burch   ANGIOPLASTY     ANGIOPLASTY     BACK SURGERY     CARDIAC SURGERY     Catheterization   CORONARY STENT INTERVENTION N/A 07/09/2019   Procedure: CORONARY STENT INTERVENTION;  Surgeon: Yates Decamp, MD;  Location: [facility] INVASIVE CV LAB;  Service: Cardiovascular;  Laterality: N/A;   LEFT HEART CATH AND CORONARY ANGIOGRAPHY N/A 07/09/2019   Procedure: LEFT HEART CATH AND CORONARY  ANGIOGRAPHY and possible intervention;  Surgeon: Yates Decamp, MD;  Location: [facility] INVASIVE CV LAB;  Service: Cardiovascular;  Laterality: N/A;  4: or 4:30 PM today   NECK SURGERY       OBJECTIVE:  Vitals:   09/18/20 1452 09/18/20 1511  BP: (!) 148/131 130/66  Pulse: (!) 49 (!) 52  Resp: (!) 21   Temp: 98 F (36.7 C)   TempSrc: Oral   SpO2: 100% 100%    General appearance: alert, oriented, appears to be in pain while supine on table HEENT: [place]; AT; oropharynx moist Lungs: unlabored respirations Abdomen: soft; without distention; significant tenderness to palpation over LLQ that causes her to withdraw; overall difficult exam secondary to reported pain Back: without reported CVA tenderness Extremities: without LE edema; symmetrical; without gross deformities Skin: warm and dry Psychological: alert and cooperative; normal mood and affect   No Known Allergies                                             Past Medical History:  Diagnosis Date   Anxiety    Arthritis    Atherosclerosis    Atrial fibrillation (HCC)    Coronary artery disease    Heart attack (HCC)    Heart disease    High cholesterol    Hypertension     Social History   Socioeconomic  History   Marital status: Widowed    Spouse name: Not on file   Number of children: 1   Years of education: Not on file   Highest education level: Not on file  Occupational History   Not on file  Tobacco Use   Smoking status: Never Smoker   Smokeless tobacco: Never Used  Vaping Use   Vaping Use: Never used  Substance and Sexual Activity   Alcohol use: Yes    Comment: Occasional   Drug use: No   Sexual activity: Not Currently    Birth control/protection: Post-menopausal    Comment: 1st intercourse 51 yo-1 partners  Other Topics Concern   Not on file  Social History Narrative   Not on file   Social Determinants of Health   Financial Resource Strain:    Difficulty of Paying Living Expenses: Not  on file  Food Insecurity:    Worried About Programme researcher, broadcasting/film/video in the Last Year: Not on file   [employer] of Food in the Last Year: Not on file  Transportation Needs:    Lack of Transportation (Medical): Not on file   Lack of Transportation (Non-Medical): Not on file  Physical Activity:    Days of Exercise per Week: Not on file   Minutes of Exercise per Session: Not on file  Stress:    Feeling of Stress : Not on file  Social Connections:    Frequency of Communication with Friends and Family: Not on file   Frequency of Social Gatherings with Friends and Family: Not on file   Attends Religious Services: Not on file   Active Member of Clubs or Organizations: Not on file   Attends Banker Meetings: Not on file   Marital Status: Not on file  Intimate Partner Violence:    Fear of Current or Ex-Partner: Not on file   Emotionally Abused: Not on file   Physically Abused: Not on file   Sexually Abused: Not on file    Family History  Problem Relation Age of Onset   Uterine cancer Mother    Hypertension Brother    Heart disease Brother    Breast cancer Pauletta Browns, MD 09/18/20 1549

## 2020-09-18 NOTE — ED Triage Notes (Signed)
Pt diagnosed with diverticulitis 3 weeks ago. Pt started having severe abd pain yesterday and went to urgent care today. Pt sent here for further workup. No nausea/vomiting. Last bowel movement 3 days ago. Urgent care gave 4mg  morphine and 4mg  zofran ODT. BP 130/76, HR 50 (normal for pt)

## 2020-09-22 ENCOUNTER — Encounter: Payer: Self-pay | Admitting: Cardiology

## 2020-09-22 DIAGNOSIS — K5792 Diverticulitis of intestine, part unspecified, without perforation or abscess without bleeding: Secondary | ICD-10-CM | POA: Diagnosis not present

## 2020-09-25 NOTE — Patient Instructions (Addendum)
DUE TO COVID-19 ONLY ONE VISITOR IS ALLOWED TO COME WITH YOU AND STAY IN THE WAITING ROOM ONLY DURING PRE OP AND PROCEDURE DAY OF SURGERY. THE 1 VISITOR  MAY VISIT WITH YOU AFTER SURGERY IN YOUR PRIVATE ROOM DURING VISITING HOURS ONLY!  YOU NEED TO HAVE A COVID 19 TEST ON__10/30_____ @_9 :25______, THIS TEST MUST BE DONE BEFORE SURGERY,  COVID TESTING SITE [address] [place] , IT IS ON THE RIGHT GOING OUT [address] APPROXIMATELY  2 MINUTES PAST [employer] ON THE RIGHT. ONCE YOUR COVID TEST IS COMPLETED,  PLEASE BEGIN THE QUARANTINE INSTRUCTIONS AS OUTLINED IN YOUR HANDOUT.                [id number] [address]    Your procedure is scheduled on: 10/07/20   Report to [facility] Main  Entrance   Report to admitting at 9:00 AM     Call this number if you have problems the morning of surgery [phone]    BRUSH YOUR TEETH MORNING OF SURGERY AND RINSE YOUR MOUTH OUT, NO CHEWING GUM CANDY OR MINTS.    No food after midnight.    You may have clear liquid until 4:30 AM.    At 4:30 AM drink pre surgery drink.   Nothing by mouth after 4:30 AM.   Take these medicines the morning of surgery with A SIP OF WATER: Isorbide                                 You may not have any metal on your body including hair pins and              piercings  Do not wear jewelry, make-up, lotions, powders or perfumes, deodorant             Do not wear nail polish on your fingernails.  Do not shave  48 hours prior to surgery.                Do not bring valuables to the hospital. [facility] IS NOT             RESPONSIBLE   FOR VALUABLES.  Contacts, dentures or bridgework may not be worn into surgery.                 Please read over the following fact sheets you were given: _____________________________________________________________________             [facility] - Preparing for Surgery Before surgery, you can play an important role.   Because skin is  not sterile, your skin needs to be as free of germs as possible.   You can reduce the number of germs on your skin by washing with CHG (chlorahexidine gluconate) soap before surgery .  CHG is an antiseptic cleaner which kills germs and bonds with the skin to continue killing germs even after washing. Please DO NOT use if you have an allergy to CHG or antibacterial soaps.   If your skin becomes reddened/irritated stop using the CHG and inform your nurse when you arrive at Short Stay. Do not shave (including legs and underarms) for at least 48 hours prior to the first CHG shower.   Please follow these instructions carefully:  1.  Shower with CHG Soap the night before surgery and the  morning of Surgery.  2.  If you choose to wash your hair, wash  your hair first as usual with your  normal  shampoo.  3.  After you shampoo, rinse your hair and body thoroughly to remove the  shampoo.                                        4.  Use CHG as you would any other liquid soap.  You can apply chg directly  to the skin and wash                       Gently with a scrungie or clean washcloth.  5.  Apply the CHG Soap to your body ONLY FROM THE NECK DOWN.   Do not use on face/ open                           Wound or open sores. Avoid contact with eyes, ears mouth and genitals (private parts).                       Wash face,  Genitals (private parts) with your normal soap.             6.  Wash thoroughly, paying special attention to the area where your surgery  will be performed.  7.  Thoroughly rinse your body with warm water from the neck down.  8.  DO NOT shower/wash with your normal soap after using and rinsing off  the CHG Soap.             9.  Pat yourself dry with a clean towel.            10.  Wear clean pajamas.            11.  Place clean sheets on your bed the night of your first shower and do not  sleep with pets. Day of Surgery : Do not apply any lotions/deodorants the morning of surgery.  Please  wear clean clothes to the hospital/surgery center.  FAILURE TO FOLLOW THESE INSTRUCTIONS MAY RESULT IN THE CANCELLATION OF YOUR SURGERY PATIENT SIGNATURE_________________________________  NURSE SIGNATURE__________________________________  ________________________________________________________________________   Erica Frank  An incentive spirometer is a tool that can help keep your lungs clear and active. This tool measures how well you are filling your lungs with each breath. Taking long deep breaths may help reverse or decrease the chance of developing breathing (pulmonary) problems (especially infection) following:  A long period of time when you are unable to move or be active. BEFORE THE PROCEDURE   If the spirometer includes an indicator to show your best effort, your nurse or respiratory therapist will set it to a desired goal.  If possible, sit up straight or lean slightly forward. Try not to slouch.  Hold the incentive spirometer in an upright position. INSTRUCTIONS FOR USE  1. Sit on the edge of your bed if possible, or sit up as far as you can in bed or on a chair. 2. Hold the incentive spirometer in an upright position. 3. Breathe out normally. 4. Place the mouthpiece in your mouth and seal your lips tightly around it. 5. Breathe in slowly and as deeply as possible, raising the piston or the ball toward the top of the column. 6. Hold your breath for 3-5 seconds or for as long as possible. Allow the piston or  ball to fall to the bottom of the column. 7. Remove the mouthpiece from your mouth and breathe out normally. 8. Rest for a few seconds and repeat Steps 1 through 7 at least 10 times every 1-2 hours when you are awake. Take your time and take a few normal breaths between deep breaths. 9. The spirometer may include an indicator to show your best effort. Use the indicator as a goal to work toward during each repetition. 10. After each set of 10 deep breaths,  practice coughing to be sure your lungs are clear. If you have an incision (the cut made at the time of surgery), support your incision when coughing by placing a pillow or rolled up towels firmly against it. Once you are able to get out of bed, walk around indoors and cough well. You may stop using the incentive spirometer when instructed by your caregiver.  RISKS AND COMPLICATIONS  Take your time so you do not get dizzy or light-headed.  If you are in pain, you may need to take or ask for pain medication before doing incentive spirometry. It is harder to take a deep breath if you are having pain. AFTER USE  Rest and breathe slowly and easily.  It can be helpful to keep track of a log of your progress. Your caregiver can provide you with a simple table to help with this. If you are using the spirometer at home, follow these instructions: SEEK MEDICAL CARE IF:   You are having difficultly using the spirometer.  You have trouble using the spirometer as often as instructed.  Your pain medication is not giving enough relief while using the spirometer.  You develop fever of 100.5 F (38.1 C) or higher. SEEK IMMEDIATE MEDICAL CARE IF:   You cough up bloody sputum that had not been present before.  You develop fever of 102 F (38.9 C) or greater.  You develop worsening pain at or near the incision site. MAKE SURE YOU:   Understand these instructions.  Will watch your condition.  Will get help right away if you are not doing well or get worse. Document Released: 04/04/2007 Document Revised: 02/14/2012 Document Reviewed: 06/05/2007 [facility] Patient Information 2014 [place], [place].   ________________________________________________________________________

## 2020-09-29 ENCOUNTER — Other Ambulatory Visit: Payer: Self-pay

## 2020-09-29 ENCOUNTER — Encounter (HOSPITAL_COMMUNITY)
Admission: RE | Admit: 2020-09-29 | Discharge: 2020-09-29 | Disposition: A | Discharge status: Home / Self Care | Payer: Medicare Other | Source: Ambulatory Visit | Attending: Orthopedic Surgery | Admitting: Orthopedic Surgery

## 2020-09-29 ENCOUNTER — Encounter (HOSPITAL_COMMUNITY): Payer: Self-pay

## 2020-09-29 DIAGNOSIS — Z01812 Encounter for preprocedural laboratory examination: Secondary | ICD-10-CM | POA: Insufficient documentation

## 2020-09-29 DIAGNOSIS — I4891 Unspecified atrial fibrillation: Secondary | ICD-10-CM | POA: Diagnosis not present

## 2020-09-29 DIAGNOSIS — Z955 Presence of coronary angioplasty implant and graft: Secondary | ICD-10-CM | POA: Insufficient documentation

## 2020-09-29 DIAGNOSIS — I10 Essential (primary) hypertension: Secondary | ICD-10-CM | POA: Insufficient documentation

## 2020-09-29 DIAGNOSIS — Z79899 Other long term (current) drug therapy: Secondary | ICD-10-CM | POA: Diagnosis not present

## 2020-09-29 DIAGNOSIS — Z7901 Long term (current) use of anticoagulants: Secondary | ICD-10-CM | POA: Insufficient documentation

## 2020-09-29 DIAGNOSIS — Z90710 Acquired absence of both cervix and uterus: Secondary | ICD-10-CM | POA: Insufficient documentation

## 2020-09-29 DIAGNOSIS — M1712 Unilateral primary osteoarthritis, left knee: Secondary | ICD-10-CM | POA: Insufficient documentation

## 2020-09-29 DIAGNOSIS — I252 Old myocardial infarction: Secondary | ICD-10-CM | POA: Insufficient documentation

## 2020-09-29 DIAGNOSIS — E7800 Pure hypercholesterolemia, unspecified: Secondary | ICD-10-CM | POA: Insufficient documentation

## 2020-09-29 DIAGNOSIS — I2510 Atherosclerotic heart disease of native coronary artery without angina pectoris: Secondary | ICD-10-CM | POA: Diagnosis not present

## 2020-09-29 HISTORY — DX: Gastro-esophageal reflux disease without esophagitis: K21.9

## 2020-09-29 HISTORY — DX: Cardiac arrhythmia, unspecified: I49.9

## 2020-09-29 LAB — CBC
HCT: 40.5 % (ref 36.0–46.0)
Hemoglobin: 13.4 g/dL (ref 12.0–15.0)
MCH: 32.4 pg (ref 26.0–34.0)
MCHC: 33.1 g/dL (ref 30.0–36.0)
MCV: 97.8 fL (ref 80.0–100.0)
Platelets: 342 10*3/uL (ref 150–400)
RBC: 4.14 MIL/uL (ref 3.87–5.11)
RDW: 12.6 % (ref 11.5–15.5)
WBC: 7.6 10*3/uL (ref 4.0–10.5)
nRBC: 0 % (ref 0.0–0.2)

## 2020-09-29 LAB — BASIC METABOLIC PANEL
Anion gap: 9 (ref 5–15)
BUN: 19 mg/dL (ref 8–23)
CO2: 26 mmol/L (ref 22–32)
Calcium: 9 mg/dL (ref 8.9–10.3)
Chloride: 104 mmol/L (ref 98–111)
Creatinine, Ser: 0.95 mg/dL (ref 0.44–1.00)
GFR, Estimated: >60 mL/min (ref >60)
Glucose, Bld: 90 mg/dL (ref 70–99)
Potassium: 4.3 mmol/L (ref 3.5–5.1)
Sodium: 139 mmol/L (ref 135–145)

## 2020-09-29 LAB — SURGICAL PCR SCREEN
MRSA, PCR: NEGATIVE
Staphylococcus aureus: NEGATIVE

## 2020-09-29 NOTE — Progress Notes (Signed)
COVID Vaccine Completed:Yes Date COVID Vaccine completed:01/18/20 COVID vaccine manufacturer: Pfizer      PCP - Dr. Sherlyn Lees Cardiologist - Dr. Elliot Gault  Chest x-ray - 01/16/20 EKG - 08/20/20 Stress Test - 02/26/20 ECHO - 04/22/20 Cardiac Cath - 07/08/20 Pacemaker/ICD device last checked:NA  Sleep Study - no CPAP -   Fasting Blood Sugar - NA Checks Blood Sugar _____ times a day  Blood Thinner Instructions:Eliquis/ Dr. Felipa Eth Aspirin Instructions: Stop3 days prior to DOS/ Charlann Boxer Last Dose:10/04/20  Anesthesia review:   Patient denies shortness of breath, fever, cough and chest pain at PAT appointment yes   Patient verbalized understanding of instructions that were given to them at the PAT appointment. Patient was also instructed that they will need to review over the PAT instructions again at home before surgery. Yes Pt swims regularly. No SOB climbing stairs, doing housework or with ADLs

## 2020-09-30 NOTE — Progress Notes (Signed)
Anesthesia Chart Review:   Case: [id number] Date/Time: 10/07/20 1115   Procedure: TOTAL KNEE ARTHROPLASTY (Left Knee) - 70 mins   Anesthesia type: Spinal   Pre-op diagnosis: Left knee osteoarthritis   Location: [facility] ROOM 10 / [facility]   Surgeons: Durene Romans, MD      DISCUSSION:  Pt is a Erica Frank with hx CAD (s/p DES to LAD 07/2019), MI, atrial fibrillation (s/p cardioversion 01/2019), HTN  - Pt had episode of syncope 01/2020, suspected to be vasovagal episode. Event monitor did not identify any source of syncope.   Last dose eliquis 10/04/20    VS: BP (!) 105/58   Pulse (!) 56   Temp 36.7 C (Oral)   Resp 18   Ht 5\' 2"  (1.575 m)   Wt 61.4 kg   SpO2 97%   BMI 24.75 kg/m   PROVIDERS: - PCP is , MD - Cardiologist is Chilton Greathouse, MD. Last office visit 05/06/20   LABS: Labs reviewed: Acceptable for surgery. (all labs ordered are listed, but only abnormal results are displayed)  Labs Reviewed  SURGICAL PCR SCREEN  BASIC METABOLIC PANEL  CBC  TYPE AND SCREEN     IMAGES: 1 view CXR 01/16/20: No active disease.   EKG 08/20/20: Sinus bradycardia (49 bpm). Borderline T abnormalities, anterior leads   CV: Carotid duplex 05/08/20:  - Stenosis in the right internal carotid artery (50-69%). Stenosis in the right external carotid artery (<50%).  - Stenosis in the left internal carotid artery (16-49%). Stenosis in the left external carotid artery (<50%).  - Antegrade right vertebral artery flow. Antegrade left vertebral artery flow.    Cardiac event monitor 05/06/20:  - Predominant rhythm is normal sinus rhythm.  Minimum heart rate 39 bpm at 5:58 AM on 02/10/2020, maximum heart rate 119 bpm at 11:44 AM on 02/01/2020.  There were 1 critical, 0 serious, and 5 stable events that occurred.  4 patient activated events were accidental push or no symptoms reported revealed normal sinus rhythm. There was a 11 beat NSVT noted at the rate of 178 bpm on 02/04/2020 at 5:41 AM.   Otherwise no heart block, no atrial fibrillation, rare PVCs noted   Cardiac cath 07/09/19:  - Moderate amount of diffuse coronary calcification noted in all coronary vessels including left main.   - Right coronary artery and ramus intermediate showed mid 30% stenosis. - Mid LAD had a focal moderately calcified high-grade 95% stenosis S/P  STENT SYNERGY DES 3X24.    Echo 04/23/19:  - Normal LV systolic function with EF 59%. Left ventricle cavity is normal in size. Normal global wall motion. Calculated EF 59%.  - Moderate (Grade II) mitral regurgitation. E-wave dominant mitral inflow.  - Mild tricuspid regurgitation.  - No evidence of pulmonary hypertension.   Past Medical History:  Diagnosis Date  . Anginal pain (HCC) 1989  . Anxiety   . Arthritis   . Atherosclerosis   . Atrial fibrillation (HCC)   . Coronary artery disease   . Dysrhythmia    a-fib  . GERD (gastroesophageal reflux disease)   . Heart attack (HCC) 2020  . Heart disease   . High cholesterol   . Hypertension     Past Surgical History:  Procedure Laterality Date  . ABDOMINAL HYSTERECTOMY  1983   for endometriosis with Burch  . ANGIOPLASTY    . ANGIOPLASTY    . BACK SURGERY    . CARDIAC SURGERY     Catheterization  . CORONARY  STENT INTERVENTION N/A 07/09/2019   Procedure: CORONARY STENT INTERVENTION;  Surgeon: Yates Decamp, MD;  [facility]: [facility] INVASIVE CV LAB;  Service: Cardiovascular;  Laterality: N/A;  . LEFT HEART CATH AND CORONARY ANGIOGRAPHY N/A 07/09/2019   Procedure: LEFT HEART CATH AND CORONARY ANGIOGRAPHY and possible intervention;  Surgeon: Yates Decamp, MD;  Location: [facility] INVASIVE CV LAB;  Service: Cardiovascular;  Laterality: N/A;  4: or 4:30 PM today  . NECK SURGERY      MEDICATIONS: . BIOTIN PO  . Calcium Carbonate-Vit D-Min (CALCIUM 1200 PO)  . Coenzyme Q10 100 MG capsule  . Cyanocobalamin (VITAMIN B 12 PO)  . ELIQUIS 5 MG TABS tablet  . ezetimibe-simvastatin (VYTORIN) 10-40 MG tablet  .  HYDROcodone-acetaminophen (NORCO/VICODIN) 5-325 MG tablet  . isosorbide mononitrate (IMDUR) 60 MG 24 hr tablet  . lisinopril (ZESTRIL) 5 MG tablet  . Multiple Vitamins-Minerals (MULTIVITAMIN ADULT PO)  . nitroGLYCERIN (NITROSTAT) 0.4 MG SL tablet  . ondansetron (ZOFRAN) 4 MG tablet  . TURMERIC PO   No current facility-administered medications for this encounter.   - Last dose eliquis 10/04/20   If no changes, I anticipate pt can proceed with surgery as scheduled.   Rica Mast, PhD, FNP-BC [facility]/Anesthesiology Phone: [phone] 09/30/2020 10:17 AM

## 2020-09-30 NOTE — Anesthesia Preprocedure Evaluation (Addendum)
Anesthesia Evaluation  Patient identified by MRN, date of birth, ID band Patient awake    Reviewed: Allergy & Precautions, NPO status , Patient's Chart, lab work & pertinent test results  Airway Mallampati: II  TM Distance: >3 FB Neck ROM: Full    Dental   Pulmonary neg pulmonary ROS,    breath sounds clear to auscultation       Cardiovascular hypertension, Pt. on medications (-) angina+ CAD, + Past MI and + Cardiac Stents  + dysrhythmias Atrial Fibrillation  Rhythm:Regular Rate:Normal     Neuro/Psych negative neurological ROS     GI/Hepatic Neg liver ROS, GERD  ,  Endo/Other  negative endocrine ROS  Renal/GU negative Renal ROS     Musculoskeletal  (+) Arthritis ,   Abdominal   Peds  Hematology negative hematology ROS (+) Last dose eliquis 10/29.   Anesthesia Other Findings   Reproductive/Obstetrics                            Anesthesia Physical Anesthesia Plan  ASA: III  Anesthesia Plan: Spinal and MAC   Post-op Pain Management:    Induction:   PONV Risk Score and Plan: 2 and Propofol infusion, Ondansetron and Treatment may vary due to age or medical condition  Airway Management Planned: Natural Airway and Simple Face Mask  Additional Equipment:   Intra-op Plan:   Post-operative Plan:   Informed Consent: I have reviewed the patients History and Physical, chart, labs and discussed the procedure including the risks, benefits and alternatives for the proposed anesthesia with the patient or authorized representative who has indicated his/her understanding and acceptance.       Plan Discussed with: CRNA  Anesthesia Plan Comments:        Anesthesia Quick Evaluation

## 2020-09-30 NOTE — H&P (Signed)
TOTAL KNEE ADMISSION H&P  Patient is being admitted for left total knee arthroplasty.  Subjective:  Chief Complaint:   Left knee OA / pain  HPI: Erica Frank, 79 y.o. female, has a history of pain and functional disability in the left knee due to arthritis and has failed non-surgical conservative treatments for greater than 12 weeks to includeNSAID's and/or analgesics, corticosteriod injections and activity modification.  Onset of symptoms was gradual, starting 2.5 years ago with gradually worsening course since that time. The patient noted no past surgery on the left knee(s).  Patient currently rates pain in the left knee(s) at 10 out of 10 with activity. Patient has night pain, worsening of pain with activity and weight bearing, pain that interferes with activities of daily living, pain with passive range of motion, crepitus and joint swelling.  Patient has evidence of periarticular osteophytes and joint space narrowing by imaging studies.  There is no active infection.  Risks, benefits and expectations were discussed with the patient.  Risks including but not limited to the risk of anesthesia, blood clots, nerve damage, blood vessel damage, failure of the prosthesis, infection and up to and including death.  Patient understand the risks, benefits and expectations and wishes to proceed with surgery.   PCP: Chilton Greathouse, MD  D/C Plans:       Home (SD)  Post-op Meds:       Rx given for Robaxin, Norco, Iron, Colace and MiraLax  Tranexamic Acid:      To be given - IV   Decadron:      Is to be given  FYI:      Eliquis  Norco  DME:   Pt equipment Rx sent for - RW & 3-n-1  PT:   OPPT    [facility]armacy: [facility]    Patient Active Problem List   Diagnosis Date Noted  . Syncope 01/16/2020  . Anemia 01/16/2020  . Thrombocytopenia (HCC) 01/16/2020  . CAD (coronary artery disease) 01/16/2020  . Elevated troponin 07/09/2019  . NSTEMI (non-ST elevated myocardial infarction)  (HCC) 07/09/2019  . Paroxysmal atrial fibrillation (HCC) 02/15/2019  . Mixed hyperlipidemia 02/15/2019  . Bradycardia 02/15/2019  . Coronary artery disease involving native coronary artery of native heart with unstable angina pectoris (HCC) 12/20/2018  . Dyslipidemia 12/20/2018  . Essential hypertension 12/20/2018   Past Medical History:  Diagnosis Date  . Anginal pain (HCC) 1989  . Anxiety   . Arthritis   . Atherosclerosis   . Atrial fibrillation (HCC)   . Coronary artery disease   . Dysrhythmia    a-fib  . GERD (gastroesophageal reflux disease)   . Heart attack (HCC) 2020  . Heart disease   . High cholesterol   . Hypertension     Past Surgical History:  Procedure Laterality Date  . ABDOMINAL HYSTERECTOMY  1983   for endometriosis with Burch  . ANGIOPLASTY    . ANGIOPLASTY    . BACK SURGERY    . CARDIAC SURGERY     Catheterization  . CORONARY STENT INTERVENTION N/A 07/09/2019   Procedure: CORONARY STENT INTERVENTION;  Surgeon: Yates Decamp, MD;  [facility];  Service: Cardiovascular;  Laterality: N/A;  . LEFT HEART CATH AND CORONARY ANGIOGRAPHY N/A 07/09/2019   Procedure: LEFT HEART CATH AND CORONARY ANGIOGRAPHY and possible intervention;  Surgeon: Yates Decamp, MD;  [facility];  Service: Cardiovascular;  Laterality: N/A;  4: or 4:30 PM today  . NECK SURGERY  No current facility-administered medications for this encounter.   Current Outpatient Medications  Medication Sig Dispense Refill Last Dose  . BIOTIN PO Take 1 tablet by mouth at bedtime.     . Calcium Carbonate-Vit D-Min (CALCIUM 1200 PO) Take 1 tablet by mouth daily.      . Coenzyme Q10 100 MG capsule Take 100 mg by mouth daily.      . Cyanocobalamin (VITAMIN B 12 PO) Take 1 tablet by mouth daily.      Marland Kitchen ELIQUIS 5 MG TABS tablet TAKE 1 TABLET(5 MG) BY MOUTH TWICE DAILY (Patient taking differently: Take 5 mg by mouth 2 (two) times daily. ) 180 tablet 1   . ezetimibe-simvastatin  (VYTORIN) 10-40 MG tablet TAKE 1 TABLET BY MOUTH DAILY 90 tablet 1   . HYDROcodone-acetaminophen (NORCO/VICODIN) 5-325 MG tablet Take 1 tablet by mouth every 4 (four) hours as needed for moderate pain. (Patient not taking: Reported on 09/18/2020) 10 tablet 0   . isosorbide mononitrate (IMDUR) 60 MG 24 hr tablet Take 1 tablet (60 mg total) by mouth daily. 90 tablet 3   . lisinopril (ZESTRIL) 5 MG tablet Take 5 mg by mouth daily.  90 tablet 3   . Multiple Vitamins-Minerals (MULTIVITAMIN ADULT PO) Take 1 tablet by mouth daily.      . nitroGLYCERIN (NITROSTAT) 0.4 MG SL tablet Place 1 tablet (0.4 mg total) under the tongue every 5 (five) minutes as needed for chest pain. 25 tablet 11   . ondansetron (ZOFRAN) 4 MG tablet Take 1 tablet (4 mg total) by mouth every 8 (eight) hours as needed for nausea or vomiting. 6 tablet 0   . TURMERIC PO Take 1 tablet by mouth once a week.       Allergies  Allergen Reactions  . Repatha [Evolocumab] Diarrhea    Social History   Tobacco Use  . Smoking status: Never Smoker  . Smokeless tobacco: Never Used  Substance Use Topics  . Alcohol use: Yes    Alcohol/week: 3.0 standard drinks    Types: 3 Glasses of wine per week    Comment: Occasional    Family History  Problem Relation Age of Onset  . Uterine cancer Mother   . Hypertension Brother   . Heart disease Brother   . Breast cancer Cousin      Review of Systems  Constitutional: Negative.   HENT: Negative.   Eyes: Negative.   Respiratory: Negative.   Cardiovascular: Negative.   Gastrointestinal: Positive for heartburn.  Genitourinary: Negative.   Musculoskeletal: Positive for joint pain.  Skin: Negative.   Neurological: Negative.   Endo/Heme/Allergies: Negative.   Psychiatric/Behavioral: Negative.      Objective:  Physical Exam Constitutional:      Appearance: She is well-developed.  HENT:     Head: Normocephalic.  Eyes:     Pupils: Pupils are equal, round, and reactive to light.   Neck:     Thyroid: No thyromegaly.     Vascular: No JVD.     Trachea: No tracheal deviation.  Cardiovascular:     Rate and Rhythm: Normal rate and regular rhythm.  Pulmonary:     Effort: Pulmonary effort is normal. No respiratory distress.     Breath sounds: Normal breath sounds. No wheezing.  Abdominal:     Palpations: Abdomen is soft.     Tenderness: There is no abdominal tenderness. There is no guarding.  Musculoskeletal:     Cervical back: Neck supple.     Left knee: Swelling  and bony tenderness present. No erythema or ecchymosis. Decreased range of motion. Tenderness present.  Lymphadenopathy:     Cervical: No cervical adenopathy.  Skin:    General: Skin is warm and dry.  Neurological:     Mental Status: She is alert and oriented to person, place, and time.      Labs:  Estimated body mass index is 24.75 kg/m as calculated from the following:   Height as of 09/29/20: 5\' 2"  (1.575 m).   Weight as of 09/29/20: 61.4 kg.   Imaging Review Plain radiographs demonstrate severe degenerative joint disease of the left knee(s).  The bone quality appears to be good for age and reported activity level.      Assessment/Plan:  End stage arthritis, left knee   The patient history, physical examination, clinical judgment of the provider and imaging studies are consistent with end stage degenerative joint disease of the left knee(s) and total knee arthroplasty is deemed medically necessary. The treatment options including medical management, injection therapy arthroscopy and arthroplasty were discussed at length. The risks and benefits of total knee arthroplasty were presented and reviewed. The risks due to aseptic loosening, infection, stiffness, patella tracking problems, thromboembolic complications and other imponderables were discussed. The patient acknowledged the explanation, agreed to proceed with the plan and consent was signed. Patient is being admitted for treatment for  surgery, pain control, PT, OT, prophylactic antibiotics, VTE prophylaxis, progressive ambulation and ADL's and discharge planning. The patient is planning to be discharged home    Patient's anticipated LOS is less than 2 midnights, meeting these requirements: - Lives within 1 hour of care - Has a competent adult at home to recover with post-op recover - NO history of  - Chronic pain requiring opiods  - Diabetes  - Heart failure  - Stroke  - DVT/VTE  - Respiratory Failure/COPD  - Renal failure  - Anemia  - Advanced Liver disease

## 2020-10-04 ENCOUNTER — Other Ambulatory Visit (HOSPITAL_COMMUNITY)
Admission: RE | Admit: 2020-10-04 | Discharge: 2020-10-04 | Disposition: A | Discharge status: Home / Self Care | Payer: Medicare Other | Source: Ambulatory Visit | Attending: Orthopedic Surgery | Admitting: Orthopedic Surgery

## 2020-10-04 DIAGNOSIS — Z20822 Contact with and (suspected) exposure to covid-19: Secondary | ICD-10-CM | POA: Insufficient documentation

## 2020-10-04 DIAGNOSIS — Z01812 Encounter for preprocedural laboratory examination: Secondary | ICD-10-CM | POA: Diagnosis not present

## 2020-10-05 LAB — SARS CORONAVIRUS 2 (TAT 6-24 HRS): SARS Coronavirus 2: NEGATIVE

## 2020-10-07 ENCOUNTER — Ambulatory Visit (HOSPITAL_COMMUNITY): Payer: Medicare Other | Admitting: Certified Registered Nurse Anesthetist

## 2020-10-07 ENCOUNTER — Encounter (HOSPITAL_COMMUNITY): Payer: Self-pay | Admitting: Orthopedic Surgery

## 2020-10-07 ENCOUNTER — Other Ambulatory Visit: Payer: Self-pay

## 2020-10-07 ENCOUNTER — Observation Stay (HOSPITAL_COMMUNITY)
Admission: RE | Admit: 2020-10-07 | Discharge: 2020-10-08 | Disposition: A | Discharge status: Home / Self Care | Payer: Medicare Other | Attending: Orthopedic Surgery | Admitting: Orthopedic Surgery

## 2020-10-07 ENCOUNTER — Encounter (HOSPITAL_COMMUNITY)
Admission: RE | Disposition: A | Discharge status: Home / Self Care | Payer: Self-pay | Source: Home / Self Care | Attending: Orthopedic Surgery

## 2020-10-07 ENCOUNTER — Ambulatory Visit (HOSPITAL_COMMUNITY): Payer: Medicare Other | Admitting: Emergency Medicine

## 2020-10-07 DIAGNOSIS — Z79899 Other long term (current) drug therapy: Secondary | ICD-10-CM | POA: Insufficient documentation

## 2020-10-07 DIAGNOSIS — I25110 Atherosclerotic heart disease of native coronary artery with unstable angina pectoris: Secondary | ICD-10-CM | POA: Insufficient documentation

## 2020-10-07 DIAGNOSIS — Z7901 Long term (current) use of anticoagulants: Secondary | ICD-10-CM | POA: Diagnosis not present

## 2020-10-07 DIAGNOSIS — I119 Hypertensive heart disease without heart failure: Secondary | ICD-10-CM | POA: Diagnosis not present

## 2020-10-07 DIAGNOSIS — M1712 Unilateral primary osteoarthritis, left knee: Secondary | ICD-10-CM | POA: Diagnosis not present

## 2020-10-07 DIAGNOSIS — M659 Synovitis and tenosynovitis, unspecified: Secondary | ICD-10-CM | POA: Diagnosis not present

## 2020-10-07 DIAGNOSIS — M25562 Pain in left knee: Secondary | ICD-10-CM | POA: Diagnosis present

## 2020-10-07 DIAGNOSIS — M25762 Osteophyte, left knee: Secondary | ICD-10-CM | POA: Diagnosis not present

## 2020-10-07 DIAGNOSIS — Z96652 Presence of left artificial knee joint: Secondary | ICD-10-CM

## 2020-10-07 DIAGNOSIS — M25462 Effusion, left knee: Secondary | ICD-10-CM | POA: Diagnosis not present

## 2020-10-07 HISTORY — PX: TOTAL KNEE ARTHROPLASTY: SHX125

## 2020-10-07 LAB — ABO/RH: ABO/RH(D): O POS

## 2020-10-07 LAB — TYPE AND SCREEN
ABO/RH(D): O POS
Antibody Screen: NEGATIVE

## 2020-10-07 SURGERY — ARTHROPLASTY, KNEE, TOTAL
Anesthesia: Monitor Anesthesia Care | Site: Knee | Laterality: Left

## 2020-10-07 MED ORDER — MENTHOL 3 MG MT LOZG: 1.0000 | LOZENGE | OROMUCOSAL | Status: DC | PRN

## 2020-10-07 MED ORDER — BUPIVACAINE-EPINEPHRINE (PF) 0.25% -1:200000 IJ SOLN
INTRAMUSCULAR | Status: DC | PRN
  Administered 2020-10-07: 30 mL

## 2020-10-07 MED ORDER — SODIUM CHLORIDE 0.9 % IR SOLN
Status: DC | PRN
  Administered 2020-10-07: 1000 mL

## 2020-10-07 MED ORDER — ONDANSETRON HCL 4 MG/2ML IJ SOLN: 4.0000 mg | Freq: Four times a day (QID) | INTRAMUSCULAR | Status: DC | PRN

## 2020-10-07 MED ORDER — POVIDONE-IODINE 10 % EX SWAB
2.0000 "application " | Freq: Once | CUTANEOUS | Status: AC
  Administered 2020-10-07: 2 via TOPICAL

## 2020-10-07 MED ORDER — FERROUS SULFATE 325 (65 FE) MG PO TABS
325.0000 mg | ORAL_TABLET | Freq: Two times a day (BID) | ORAL | Status: DC
  Administered 2020-10-08: 325 mg via ORAL
  Filled 2020-10-07: qty 1

## 2020-10-07 MED ORDER — CELECOXIB 200 MG PO CAPS
200.0000 mg | ORAL_CAPSULE | Freq: Two times a day (BID) | ORAL | Status: DC
  Administered 2020-10-07 – 2020-10-08 (×2): 200 mg via ORAL
  Filled 2020-10-07 (×2): qty 1

## 2020-10-07 MED ORDER — SODIUM CHLORIDE (PF) 0.9 % IJ SOLN
INTRAMUSCULAR | Status: AC
  Filled 2020-10-07: qty 50

## 2020-10-07 MED ORDER — BUPIVACAINE-EPINEPHRINE (PF) 0.25% -1:200000 IJ SOLN
INTRAMUSCULAR | Status: AC
  Filled 2020-10-07: qty 30

## 2020-10-07 MED ORDER — ISOSORBIDE MONONITRATE ER 60 MG PO TB24
60.0000 mg | ORAL_TABLET | Freq: Every day | ORAL | Status: DC
  Filled 2020-10-07: qty 1

## 2020-10-07 MED ORDER — METHOCARBAMOL 500 MG PO TABS: 500.0000 mg | ORAL_TABLET | Freq: Four times a day (QID) | ORAL | 0 refills | Status: DC | PRN

## 2020-10-07 MED ORDER — 0.9 % SODIUM CHLORIDE (POUR BTL) OPTIME
TOPICAL | Status: DC | PRN
  Administered 2020-10-07: 1000 mL

## 2020-10-07 MED ORDER — LIDOCAINE 2% (20 MG/ML) 5 ML SYRINGE
INTRAMUSCULAR | Status: AC
  Filled 2020-10-07: qty 5

## 2020-10-07 MED ORDER — HYDROCODONE-ACETAMINOPHEN 7.5-325 MG PO TABS
1.0000 | ORAL_TABLET | ORAL | 0 refills | Status: DC | PRN
Start: 2020-10-07 — End: 2020-11-05

## 2020-10-07 MED ORDER — CEFAZOLIN SODIUM-DEXTROSE 2-4 GM/100ML-% IV SOLN
2.0000 g | Freq: Four times a day (QID) | INTRAVENOUS | Status: AC
  Administered 2020-10-07 (×2): 2 g via INTRAVENOUS
  Filled 2020-10-07 (×2): qty 100

## 2020-10-07 MED ORDER — METOCLOPRAMIDE HCL 5 MG PO TABS: 5.0000 mg | ORAL_TABLET | Freq: Three times a day (TID) | ORAL | Status: DC | PRN

## 2020-10-07 MED ORDER — KETOROLAC TROMETHAMINE 30 MG/ML IJ SOLN
INTRAMUSCULAR | Status: DC | PRN
  Administered 2020-10-07: 30 mg via INTRA_ARTICULAR

## 2020-10-07 MED ORDER — TRANEXAMIC ACID-NACL 1000-0.7 MG/100ML-% IV SOLN
1000.0000 mg | INTRAVENOUS | Status: AC
  Administered 2020-10-07: 1000 mg via INTRAVENOUS
  Filled 2020-10-07: qty 100

## 2020-10-07 MED ORDER — METHOCARBAMOL 500 MG IVPB - SIMPLE MED
500.0000 mg | Freq: Four times a day (QID) | INTRAVENOUS | Status: DC | PRN
  Filled 2020-10-07: qty 50

## 2020-10-07 MED ORDER — ACETAMINOPHEN 325 MG PO TABS: 325.0000 mg | ORAL_TABLET | Freq: Four times a day (QID) | ORAL | Status: DC | PRN

## 2020-10-07 MED ORDER — PROPOFOL 10 MG/ML IV BOLUS
INTRAVENOUS | Status: DC | PRN
  Administered 2020-10-07: 20 mg via INTRAVENOUS
  Administered 2020-10-07: 30 mg via INTRAVENOUS

## 2020-10-07 MED ORDER — DOCUSATE SODIUM 100 MG PO CAPS
100.0000 mg | ORAL_CAPSULE | Freq: Two times a day (BID) | ORAL | Status: DC
  Administered 2020-10-07 – 2020-10-08 (×2): 100 mg via ORAL
  Filled 2020-10-07 (×2): qty 1

## 2020-10-07 MED ORDER — ONDANSETRON HCL 4 MG PO TABS: 4.0000 mg | ORAL_TABLET | Freq: Four times a day (QID) | ORAL | Status: DC | PRN

## 2020-10-07 MED ORDER — SODIUM CHLORIDE 0.9 % IV SOLN: INTRAVENOUS | Status: DC

## 2020-10-07 MED ORDER — NITROGLYCERIN 0.4 MG SL SUBL: 0.4000 mg | SUBLINGUAL_TABLET | SUBLINGUAL | Status: DC | PRN

## 2020-10-07 MED ORDER — DEXAMETHASONE SODIUM PHOSPHATE 10 MG/ML IJ SOLN
INTRAMUSCULAR | Status: AC
  Filled 2020-10-07: qty 1

## 2020-10-07 MED ORDER — DEXAMETHASONE SODIUM PHOSPHATE 10 MG/ML IJ SOLN: 10.0000 mg | Freq: Once | INTRAMUSCULAR | Status: DC

## 2020-10-07 MED ORDER — ONDANSETRON HCL 4 MG/2ML IJ SOLN
INTRAMUSCULAR | Status: DC | PRN
  Administered 2020-10-07: 4 mg via INTRAVENOUS

## 2020-10-07 MED ORDER — FENTANYL CITRATE (PF) 100 MCG/2ML IJ SOLN: 50.0000 ug | INTRAMUSCULAR | Status: AC

## 2020-10-07 MED ORDER — CELECOXIB 200 MG PO CAPS
ORAL_CAPSULE | ORAL | Status: AC
  Administered 2020-10-07: 200 mg
  Filled 2020-10-07: qty 1

## 2020-10-07 MED ORDER — POLYETHYLENE GLYCOL 3350 17 G PO PACK
17.0000 g | PACK | Freq: Two times a day (BID) | ORAL | Status: DC
  Administered 2020-10-07: 17 g via ORAL
  Filled 2020-10-07 (×2): qty 1

## 2020-10-07 MED ORDER — CHLORHEXIDINE GLUCONATE 0.12 % MT SOLN
15.0000 mL | Freq: Once | OROMUCOSAL | Status: AC
  Administered 2020-10-07: 15 mL via OROMUCOSAL

## 2020-10-07 MED ORDER — MAGNESIUM CITRATE PO SOLN: 1.0000 | Freq: Once | ORAL | Status: DC | PRN

## 2020-10-07 MED ORDER — DEXAMETHASONE SODIUM PHOSPHATE 10 MG/ML IJ SOLN
INTRAMUSCULAR | Status: DC | PRN
  Administered 2020-10-07: 8 mg via INTRAVENOUS

## 2020-10-07 MED ORDER — EPHEDRINE SULFATE-NACL 50-0.9 MG/10ML-% IV SOSY
PREFILLED_SYRINGE | INTRAVENOUS | Status: DC | PRN
  Administered 2020-10-07 (×2): 10 mg via INTRAVENOUS

## 2020-10-07 MED ORDER — FENTANYL CITRATE (PF) 100 MCG/2ML IJ SOLN: 25.0000 ug | INTRAMUSCULAR | Status: DC | PRN

## 2020-10-07 MED ORDER — TRANEXAMIC ACID-NACL 1000-0.7 MG/100ML-% IV SOLN
1000.0000 mg | Freq: Once | INTRAVENOUS | Status: AC
  Administered 2020-10-07: 1000 mg via INTRAVENOUS
  Filled 2020-10-07: qty 100

## 2020-10-07 MED ORDER — ACETAMINOPHEN 500 MG PO TABS: 1000.0000 mg | ORAL_TABLET | Freq: Once | ORAL | Status: DC

## 2020-10-07 MED ORDER — SODIUM CHLORIDE (PF) 0.9 % IJ SOLN
INTRAMUSCULAR | Status: DC | PRN
  Administered 2020-10-07: 30 mL

## 2020-10-07 MED ORDER — PROPOFOL 500 MG/50ML IV EMUL
INTRAVENOUS | Status: DC | PRN
  Administered 2020-10-07: 75 ug/kg/min via INTRAVENOUS

## 2020-10-07 MED ORDER — HYDROMORPHONE HCL 1 MG/ML IJ SOLN: 0.5000 mg | INTRAMUSCULAR | Status: DC | PRN

## 2020-10-07 MED ORDER — CEFAZOLIN SODIUM-DEXTROSE 2-4 GM/100ML-% IV SOLN
2.0000 g | INTRAVENOUS | Status: AC
  Administered 2020-10-07: 2 g via INTRAVENOUS
  Filled 2020-10-07: qty 100

## 2020-10-07 MED ORDER — ACETAMINOPHEN 500 MG PO TABS
ORAL_TABLET | ORAL | Status: AC
  Administered 2020-10-07: 1000 mg
  Filled 2020-10-07: qty 2

## 2020-10-07 MED ORDER — HYDROCODONE-ACETAMINOPHEN 7.5-325 MG PO TABS: 1.0000 | ORAL_TABLET | ORAL | Status: DC | PRN

## 2020-10-07 MED ORDER — DOCUSATE SODIUM 100 MG PO CAPS: 100.0000 mg | ORAL_CAPSULE | Freq: Two times a day (BID) | ORAL | 0 refills | Status: DC

## 2020-10-07 MED ORDER — FERROUS SULFATE 325 (65 FE) MG PO TABS: 325.0000 mg | ORAL_TABLET | Freq: Three times a day (TID) | ORAL | 0 refills | Status: DC

## 2020-10-07 MED ORDER — KETOROLAC TROMETHAMINE 30 MG/ML IJ SOLN
INTRAMUSCULAR | Status: AC
  Filled 2020-10-07: qty 1

## 2020-10-07 MED ORDER — DEXAMETHASONE SODIUM PHOSPHATE 10 MG/ML IJ SOLN
10.0000 mg | Freq: Once | INTRAMUSCULAR | Status: AC
  Administered 2020-10-08: 10 mg via INTRAVENOUS
  Filled 2020-10-07: qty 1

## 2020-10-07 MED ORDER — MIDAZOLAM HCL 2 MG/2ML IJ SOLN
INTRAMUSCULAR | Status: AC
  Filled 2020-10-07: qty 2

## 2020-10-07 MED ORDER — METOCLOPRAMIDE HCL 5 MG/ML IJ SOLN: 5.0000 mg | Freq: Three times a day (TID) | INTRAMUSCULAR | Status: DC | PRN

## 2020-10-07 MED ORDER — LACTATED RINGERS IV SOLN: INTRAVENOUS | Status: DC

## 2020-10-07 MED ORDER — POLYETHYLENE GLYCOL 3350 17 G PO PACK: 17.0000 g | PACK | Freq: Two times a day (BID) | ORAL | 0 refills | Status: DC

## 2020-10-07 MED ORDER — ROPIVACAINE HCL 5 MG/ML IJ SOLN
INTRAMUSCULAR | Status: DC | PRN
  Administered 2020-10-07: 25 mL via PERINEURAL

## 2020-10-07 MED ORDER — FENTANYL CITRATE (PF) 100 MCG/2ML IJ SOLN
INTRAMUSCULAR | Status: AC
  Administered 2020-10-07: 50 ug via INTRAVENOUS
  Filled 2020-10-07: qty 2

## 2020-10-07 MED ORDER — PHENOL 1.4 % MT LIQD: 1.0000 | OROMUCOSAL | Status: DC | PRN

## 2020-10-07 MED ORDER — BUPIVACAINE IN DEXTROSE 0.75-8.25 % IT SOLN
INTRATHECAL | Status: DC | PRN
  Administered 2020-10-07: 1.6 mL via INTRATHECAL

## 2020-10-07 MED ORDER — MIDAZOLAM HCL 2 MG/2ML IJ SOLN: 1.0000 mg | INTRAMUSCULAR | Status: DC

## 2020-10-07 MED ORDER — ORAL CARE MOUTH RINSE: 15.0000 mL | Freq: Once | OROMUCOSAL | Status: AC

## 2020-10-07 MED ORDER — EZETIMIBE-SIMVASTATIN 10-40 MG PO TABS
1.0000 | ORAL_TABLET | Freq: Every day | ORAL | Status: DC
  Administered 2020-10-07: 1 via ORAL
  Filled 2020-10-07 (×2): qty 1

## 2020-10-07 MED ORDER — LACTATED RINGERS IV BOLUS: 250.0000 mL | Freq: Once | INTRAVENOUS | Status: DC

## 2020-10-07 MED ORDER — PROPOFOL 10 MG/ML IV BOLUS
INTRAVENOUS | Status: AC
  Filled 2020-10-07: qty 20

## 2020-10-07 MED ORDER — DIPHENHYDRAMINE HCL 12.5 MG/5ML PO ELIX: 12.5000 mg | ORAL_SOLUTION | ORAL | Status: DC | PRN

## 2020-10-07 MED ORDER — ONDANSETRON HCL 4 MG/2ML IJ SOLN
INTRAMUSCULAR | Status: AC
  Filled 2020-10-07: qty 2

## 2020-10-07 MED ORDER — METHOCARBAMOL 500 MG PO TABS
500.0000 mg | ORAL_TABLET | Freq: Four times a day (QID) | ORAL | Status: DC | PRN
  Administered 2020-10-07: 500 mg via ORAL
  Filled 2020-10-07: qty 1

## 2020-10-07 MED ORDER — CLONIDINE HCL (ANALGESIA) 100 MCG/ML EP SOLN
EPIDURAL | Status: DC | PRN
  Administered 2020-10-07: 50 ug

## 2020-10-07 MED ORDER — APIXABAN 2.5 MG PO TABS
2.5000 mg | ORAL_TABLET | Freq: Two times a day (BID) | ORAL | Status: DC
  Administered 2020-10-08: 2.5 mg via ORAL
  Filled 2020-10-07: qty 1

## 2020-10-07 MED ORDER — LACTATED RINGERS IV BOLUS: 500.0000 mL | Freq: Once | INTRAVENOUS | Status: DC

## 2020-10-07 MED ORDER — HYDROCODONE-ACETAMINOPHEN 5-325 MG PO TABS
1.0000 | ORAL_TABLET | ORAL | Status: DC | PRN
  Administered 2020-10-07 – 2020-10-08 (×4): 2 via ORAL
  Filled 2020-10-07 (×4): qty 2

## 2020-10-07 MED ORDER — BISACODYL 10 MG RE SUPP: 10.0000 mg | Freq: Every day | RECTAL | Status: DC | PRN

## 2020-10-07 MED ORDER — STERILE WATER FOR IRRIGATION IR SOLN
Status: DC | PRN
  Administered 2020-10-07: 2000 mL

## 2020-10-07 MED ORDER — ALUM & MAG HYDROXIDE-SIMETH 200-200-20 MG/5ML PO SUSP: 15.0000 mL | ORAL | Status: DC | PRN

## 2020-10-07 SURGICAL SUPPLY — 64 items
ADH SKN CLS APL DERMABOND .7 (GAUZE/BANDAGES/DRESSINGS) ×1
ATTUNE MED ANAT PAT 35 KNEE (Knees) ×1 IMPLANT
ATTUNE PSFEM LTSZ5 NARCEM KNEE (Femur) ×1 IMPLANT
ATTUNE PSRP INSE SZ5 7 KNEE (Insert) ×1 IMPLANT
BAG SPEC THK2 15X12 ZIP CLS (MISCELLANEOUS)
BAG ZIPLOCK 12X15 (MISCELLANEOUS) IMPLANT
BASEPLATE TIBIAL ROTATING SZ 4 (Knees) ×1 IMPLANT
BLADE SAW SGTL 11.0X1.19X90.0M (BLADE) IMPLANT
BLADE SAW SGTL 13.0X1.19X90.0M (BLADE) ×2 IMPLANT
BLADE SURG SZ10 CARB STEEL (BLADE) ×4 IMPLANT
BNDG ELASTIC 6X5.8 VLCR STR LF (GAUZE/BANDAGES/DRESSINGS) ×2 IMPLANT
BOWL SMART MIX CTS (DISPOSABLE) ×2 IMPLANT
BSPLAT TIB 4 CMNT ROT PLAT STR (Knees) ×1 IMPLANT
CEMENT HV SMART SET (Cement) ×2 IMPLANT
COVER SURGICAL LIGHT HANDLE (MISCELLANEOUS) ×2 IMPLANT
COVER WAND RF STERILE (DRAPES) IMPLANT
CUFF TOURN SGL QUICK 34 (TOURNIQUET CUFF) ×2
CUFF TRNQT CYL 34X4.125X (TOURNIQUET CUFF) ×1 IMPLANT
DECANTER SPIKE VIAL GLASS SM (MISCELLANEOUS) ×4 IMPLANT
DERMABOND ADVANCED (GAUZE/BANDAGES/DRESSINGS) ×1
DERMABOND ADVANCED .7 DNX12 (GAUZE/BANDAGES/DRESSINGS) ×1 IMPLANT
DRAPE U-SHAPE 47X51 STRL (DRAPES) ×2 IMPLANT
DRESSING AQUACEL AG SP 3.5X10 (GAUZE/BANDAGES/DRESSINGS) ×1 IMPLANT
DRSG AQUACEL AG ADV 3.5X10 (GAUZE/BANDAGES/DRESSINGS) ×1 IMPLANT
DRSG AQUACEL AG SP 3.5X10 (GAUZE/BANDAGES/DRESSINGS) ×2
DURAPREP 26ML APPLICATOR (WOUND CARE) ×4 IMPLANT
ELECT REM PT RETURN 15FT ADLT (MISCELLANEOUS) ×2 IMPLANT
GLOVE BIO SURGEON STRL SZ 6 (GLOVE) ×1 IMPLANT
GLOVE BIOGEL PI IND STRL 6.5 (GLOVE) ×1 IMPLANT
GLOVE BIOGEL PI IND STRL 7.5 (GLOVE) ×1 IMPLANT
GLOVE BIOGEL PI IND STRL 8.5 (GLOVE) ×1 IMPLANT
GLOVE BIOGEL PI INDICATOR 6.5 (GLOVE)
GLOVE BIOGEL PI INDICATOR 7.5 (GLOVE) ×1
GLOVE BIOGEL PI INDICATOR 8.5 (GLOVE) ×1
GLOVE ECLIPSE 8.0 STRL XLNG CF (GLOVE) ×2 IMPLANT
GLOVE ORTHO TXT STRL SZ7.5 (GLOVE) ×2 IMPLANT
GOWN STRL REUS W/ TWL LRG LVL3 (GOWN DISPOSABLE) ×1 IMPLANT
GOWN STRL REUS W/TWL 2XL LVL3 (GOWN DISPOSABLE) ×2 IMPLANT
GOWN STRL REUS W/TWL LRG LVL3 (GOWN DISPOSABLE) ×4 IMPLANT
HANDPIECE INTERPULSE COAX TIP (DISPOSABLE) ×2
HOLDER FOLEY CATH W/STRAP (MISCELLANEOUS) IMPLANT
IMMOBILIZER KNEE 20 (SOFTGOODS) ×2
IMMOBILIZER KNEE 20 THIGH 36 (SOFTGOODS) IMPLANT
KIT TURNOVER KIT A (KITS) IMPLANT
MANIFOLD NEPTUNE II (INSTRUMENTS) ×2 IMPLANT
NDL SAFETY ECLIPSE 18X1.5 (NEEDLE) IMPLANT
NEEDLE HYPO 18GX1.5 SHARP (NEEDLE)
NS IRRIG 1000ML POUR BTL (IV SOLUTION) ×2 IMPLANT
PACK TOTAL KNEE CUSTOM (KITS) ×2 IMPLANT
PENCIL SMOKE EVACUATOR (MISCELLANEOUS) IMPLANT
PIN DRILL FIX HALF THREAD (BIT) ×1 IMPLANT
PIN FIX SIGMA LCS THRD HI (PIN) ×1 IMPLANT
PROTECTOR NERVE ULNAR (MISCELLANEOUS) ×2 IMPLANT
SET HNDPC FAN SPRY TIP SCT (DISPOSABLE) ×1 IMPLANT
SET PAD KNEE POSITIONER (MISCELLANEOUS) ×2 IMPLANT
SUT MNCRL AB 4-0 PS2 18 (SUTURE) ×2 IMPLANT
SUT STRATAFIX PDS+ 0 24IN (SUTURE) ×2 IMPLANT
SUT VIC AB 1 CT1 36 (SUTURE) ×2 IMPLANT
SUT VIC AB 2-0 CT1 27 (SUTURE) ×6
SUT VIC AB 2-0 CT1 TAPERPNT 27 (SUTURE) ×3 IMPLANT
SYR 3ML LL SCALE MARK (SYRINGE) ×2 IMPLANT
TRAY FOLEY MTR SLVR 16FR STAT (SET/KITS/TRAYS/PACK) ×2 IMPLANT
WATER STERILE IRR 1000ML POUR (IV SOLUTION) ×4 IMPLANT
WRAP KNEE MAXI GEL POST OP (GAUZE/BANDAGES/DRESSINGS) ×2 IMPLANT

## 2020-10-07 NOTE — Anesthesia Procedure Notes (Signed)
Spinal  Patient location during procedure: OR Start time: 10/07/2020 11:40 AM End time: 10/07/2020 11:42 AM Staffing Performed: resident/CRNA  Anesthesiologist: Marcene Duos, MD Resident/CRNA: Pearson Grippe, CRNA Preanesthetic Checklist Completed: patient identified, IV checked, site marked, risks and benefits discussed, surgical consent, monitors and equipment checked, pre-op evaluation and timeout performed Spinal Block Patient position: sitting Prep: DuraPrep Patient monitoring: heart rate, cardiac monitor, continuous pulse ox and blood pressure Approach: midline Location: L3-4 Injection technique: single-shot Needle Needle type: Sprotte  Needle gauge: 24 G Needle length: 9 cm Assessment Sensory level: T6 Additional Notes Consent/allergies verified. Pt sitting for spinal. Spinal attempt x1, + clear, free-flowing CSF. Easy to aspirate/inject, - paresthesia. Level to T6. NAC.

## 2020-10-07 NOTE — Transfer of Care (Signed)
Immediate Anesthesia Transfer of Care Note  Patient: Erica Frank  Procedure(s) Performed: TOTAL KNEE ARTHROPLASTY (Left Knee)  Patient Location: PACU  Anesthesia Type:Spinal and MAC combined with regional for post-op pain  Level of Consciousness: awake, alert  and patient cooperative  Airway & Oxygen Therapy: Patient Spontanous Breathing and Patient connected to face mask oxygen  Post-op Assessment: Report given to RN and Post -op Vital signs reviewed and stable  Post vital signs: Reviewed and stable  Last Vitals:  Vitals Value Taken Time  BP 110/59 10/07/20 1313  Temp    Pulse 58 10/07/20 1314  Resp 22 10/07/20 1314  SpO2 100 % 10/07/20 1314  Vitals shown include unvalidated device data.  Last Pain:  Vitals:   10/07/20 0945  TempSrc:   PainSc: 5       Patients Stated Pain Goal: 4 (79/43/27 6147)  Complications: No complications documented.

## 2020-10-07 NOTE — Discharge Instructions (Signed)

## 2020-10-07 NOTE — Anesthesia Postprocedure Evaluation (Signed)
Anesthesia Post Note  Patient: Erica Frank  Procedure(s) Performed: TOTAL KNEE ARTHROPLASTY (Left Knee)     Patient location during evaluation: PACU Anesthesia Type: Spinal Level of consciousness: awake and alert Pain management: pain level controlled Vital Signs Assessment: post-procedure vital signs reviewed and stable Respiratory status: spontaneous breathing and respiratory function stable Cardiovascular status: blood pressure returned to baseline and stable Postop Assessment: spinal receding Anesthetic complications: no   No complications documented.  Last Vitals:  Vitals:   10/07/20 1515 10/07/20 1537  BP: 115/69 131/69  Pulse: (!) 57 (!) 56  Resp: 15 16  Temp:  (!) 36.4 C  SpO2: 99% 98%    Last Pain:  Vitals:   10/07/20 1537  TempSrc: Oral  PainSc:                  Kennieth Rad

## 2020-10-07 NOTE — Evaluation (Signed)
Physical Therapy Evaluation Patient Details Name: Erica Frank MRN: [id number] DOB: 06-10-1941 Today's Date: 10/07/2020   History of Present Illness  Patient is 79 y.o. female s/p Lt TKA on 10/07/20 with PMH significant for HTN, CAD, MI (2020), GERD, A-fib, OA, anxiety.    Clinical Impression  Erica Frank is a 79 y.o. female POD 0 s/p Lt TKA. Patient reports independence with mobility at baseline. Patient is now limited by functional impairments (see PT problem list below) and requires min assist for transfers and gait with RW. Patient was able to ambulate ~40 feet with RW and min assist. Patient instructed in exercise to facilitate circulation. Patient will benefit from continued skilled PT interventions to address impairments and progress towards PLOF. Acute PT will follow to progress mobility and stair training in preparation for safe discharge home.     Follow Up Recommendations Follow surgeon's recommendation for DC plan and follow-up therapies;Outpatient PT    Equipment Recommendations  Rolling walker with 5" wheels;3in1 (PT) (need to confirm with patient)    Recommendations for Other Services       Precautions / Restrictions Precautions Precautions: Fall Restrictions Weight Bearing Restrictions: No LLE Weight Bearing: Weight bearing as tolerated      Mobility  Bed Mobility Overal bed mobility: Needs Assistance Bed Mobility: Supine to Sit     Supine to sit: Min assist;HOB elevated     General bed mobility comments: cues for use of bed rail, assist for scooting to EOB.    Transfers Overall transfer level: Needs assistance Equipment used: Rolling walker (2 wheeled) Transfers: Sit to/from Stand Sit to Stand: Min assist         General transfer comment: min assist for power up to rise, cues for safe hand placement/technique with RW  Ambulation/Gait Ambulation/Gait assistance: Min assist Gait Distance (Feet): 40 Feet Assistive device: Rolling walker  (2 wheeled) Gait Pattern/deviations: Step-to pattern;Decreased stride length;Decreased weight shift to left;Antalgic Gait velocity: decr   General Gait Details: VC's for step pattern with RW and safe proximity, assist requried to steady and manage walker position. no buckling at Lt knee and no overt LOB  Stairs            Wheelchair Mobility    Modified Rankin (Stroke Patients Only)       Balance Overall balance assessment: Needs assistance Sitting-balance support: Feet supported Sitting balance-Leahy Scale: Good     Standing balance support: During functional activity;Bilateral upper extremity supported Standing balance-Leahy Scale: Fair                               Pertinent Vitals/Pain Pain Assessment: 0-10 Pain Score: 2  Pain Location: Lt knee Pain Descriptors / Indicators: Aching;Discomfort;Dull Pain Intervention(s): Limited activity within patient's tolerance;Monitored during session;Repositioned;Ice applied    Home Living Family/patient expects to be discharged to:: Private residence Living Arrangements: Children (pt's daugther lives with her) Available Help at Discharge: Family Type of Home: House Home Access: Ramped entrance     Home Layout: Multi-level;Able to live on main level with bedroom/bathroom;Full bath on main level        Prior Function Level of Independence: Independent         Comments: pt enjoys gardening     Hand Dominance   Dominant Hand: Right    Extremity/Trunk Assessment   Upper Extremity Assessment Upper Extremity Assessment: Overall WFL for tasks assessed    Lower Extremity Assessment Lower Extremity Assessment:  LLE deficits/detail LLE Deficits / Details: good quad acitvation, no extensor lag with SLR LLE Sensation: WNL LLE Coordination: WNL    Cervical / Trunk Assessment Cervical / Trunk Assessment: Normal  Communication   Communication: No difficulties  Cognition Arousal/Alertness:  Awake/alert Behavior During Therapy: WFL for tasks assessed/performed Overall Cognitive Status: Within Functional Limits for tasks assessed                                        General Comments      Exercises Total Joint Exercises Ankle Circles/Pumps: AROM;Both;20 reps;Seated   Assessment/Plan    PT Assessment Patient needs continued PT services  PT Problem List Decreased strength;Decreased range of motion;Decreased activity tolerance;Decreased balance;Decreased mobility;Decreased knowledge of use of DME;Decreased knowledge of precautions;Pain       PT Treatment Interventions DME instruction;Gait training;Stair training;Functional mobility training;Therapeutic activities;Therapeutic exercise;Balance training;Patient/family education    PT Goals (Current goals can be found in the Care Plan section)  Acute Rehab PT Goals Patient Stated Goal: regain independence to walk on treadmill and swim at [facility] PT Goal Formulation: With patient Time For Goal Achievement: 10/14/20 Potential to Achieve Goals: Good    Frequency 7X/week   Barriers to discharge        Co-evaluation               AM-PAC PT "6 Clicks" Mobility  Outcome Measure Help needed turning from your back to your side while in a flat bed without using bedrails?: A Little Help needed moving from lying on your back to sitting on the side of a flat bed without using bedrails?: A Little Help needed moving to and from a bed to a chair (including a wheelchair)?: A Little Help needed standing up from a chair using your arms (e.g., wheelchair or bedside chair)?: A Little Help needed to walk in hospital room?: A Little Help needed climbing 3-5 steps with a railing? : A Little 6 Click Score: 18    End of Session Equipment Utilized During Treatment: Gait belt Activity Tolerance: Patient tolerated treatment well;Patient limited by pain Patient left: in chair;with call bell/phone within reach;with chair  alarm set Nurse Communication: Mobility status PT Visit Diagnosis: Muscle weakness (generalized) (M62.81);Difficulty in walking, not elsewhere classified (R26.2);Pain Pain - Right/Left: Left Pain - part of body: Knee    Time: 6283-6629 PT Time Calculation (min) (ACUTE ONLY): 26 min   Charges:   PT Evaluation $PT Eval Low Complexity: 1 Low PT Treatments $Gait Training: 8-22 mins        Wynn Maudlin, DPT Acute Rehabilitation Services  Office [phone] Pager [phone]  10/07/2020 6:24 PM

## 2020-10-07 NOTE — Op Note (Signed)
NAME:  Erica Frank [facility]                      MEDICAL RECORD NO.:  [id number]                             FACILITY:  [facility]      PHYSICIAN:  Madlyn Frankel. Charlann Boxer, M.D.  DATE OF BIRTH:  1941/02/11      DATE OF PROCEDURE:  10/07/2020                                     OPERATIVE REPORT         PREOPERATIVE DIAGNOSIS:  Left knee osteoarthritis.      POSTOPERATIVE DIAGNOSIS:  Left knee osteoarthritis.      FINDINGS:  The patient was noted to have complete loss of cartilage and   bone-on-bone arthritis with associated osteophytes in the lateral and patellofemoral compartments of   the knee with a significant synovitis and associated effusion.  The patient had failed months of conservative treatment including medications, injection therapy, activity modification.     PROCEDURE:  Left total knee replacement.      COMPONENTS USED:  DePuy Attune rotating platform posterior stabilized knee   system, a size 5N femur, 4 tibia, size 7 mm PS AOX insert, and 35 anatomic patellar   button.      SURGEON:  Madlyn Frankel. Charlann Boxer, M.D.      ASSISTANT:  Lanney Gins, PA-C, PA-C.      ANESTHESIA:  Regional and Spinal.      SPECIMENS:  None.      COMPLICATION:  None.      DRAINS:  None.  EBL: <100 cc      TOURNIQUET TIME:  33 min at 200 mmHg.      The patient was stable to the recovery room.      INDICATION FOR PROCEDURE:  Giabella Duhart is a 79 y.o. female patient of   mine.  The patient had been seen, evaluated, and treated for months conservatively in the   office with medication, activity modification, and injections.  The patient had   radiographic changes of bone-on-bone arthritis with endplate sclerosis and osteophytes noted.  Based on the radiographic changes and failed conservative measures, the patient   decided to proceed with definitive treatment, total knee replacement.  Risks of infection, DVT, component failure, need for revision surgery, neurovascular injury were reviewed in the  office setting.  The postop course was reviewed stressing the efforts to maximize post-operative satisfaction and function.  Consent was obtained for benefit of pain   relief.      PROCEDURE IN DETAIL:  The patient was brought to the operative theater.   Once adequate anesthesia, preoperative antibiotics, 2 gm of Ancef,1 gm of Tranexamic Acid, and 10 mg of Decadron administered, the patient was positioned supine with a left thigh tourniquet placed.  The  left lower extremity was prepped and draped in sterile fashion.  A time-   out was performed identifying the patient, planned procedure, and the appropriate extremity.      The left lower extremity was placed in the [facility] leg holder.  The leg was   exsanguinated, tourniquet elevated to 250 mmHg.  A midline incision was   made followed by median parapatellar arthrotomy.  Following initial   exposure,  attention was first directed to the patella.  Precut   measurement was noted to be 22 mm.  I resected down to 14 mm and used a   35 anatomic patellar button to restore patellar height as well as cover the cut surface.      The lug holes were drilled and a metal shim was placed to protect the   patella from retractors and saw blade during the procedure.      At this point, attention was now directed to the femur.  The femoral   canal was opened with a drill, irrigated to try to prevent fat emboli.  An   intramedullary rod was passed at 3 degrees valgus, 9 mm of bone was   resected off the distal femur.  Following this resection, the tibia was   subluxated anteriorly.  Using the extramedullary guide, 2 mm of bone was resected off   the proximal medial tibia.  We confirmed the gap would be   stable medially and laterally with a size 5 spacer block as well as confirmed that the tibial cut was perpendicular in the coronal plane, checking with an alignment rod.      Once this was done, I sized the femur to be a size 5 in the anterior-   posterior  dimension, chose a narrow component based on medial and   lateral dimension.  The size 5 rotation block was then pinned in   position anterior referenced using the C-clamp to set rotation.  The   anterior, posterior, and  chamfer cuts were made without difficulty nor   notching making certain that I was along the anterior cortex to help   with flexion gap stability.      The final box cut was made off the lateral aspect of distal femur.      At this point, the tibia was sized to be a size 4.  The size 4 tray was   then pinned in position through the medial third of the tubercle,   drilled, and keel punched.  Trial reduction was now carried with a 5 femur,  4 tibia, a size 7 mm PS insert, and the 35 anatomic patella botton.  The knee was brought to full extension with good flexion stability with the patella   tracking through the trochlea without application of pressure.  Given   all these findings the trial components removed.  Final components were   opened and cement was mixed.  The knee was irrigated with normal saline solution and pulse lavage.  The synovial lining was   then injected with 30 cc of 0.25% Marcaine with epinephrine, 1 cc of Toradol and 30 cc of NS for a total of 61 cc.     Final implants were then cemented onto cleaned and dried cut surfaces of bone with the knee brought to extension with a size 7 mm PS trial insert.      Once the cement had fully cured, excess cement was removed   throughout the knee.  I confirmed that I was satisfied with the range of   motion and stability, and the final size 7 mm PS AOX insert was chosen.  It was   placed into the knee.      The tourniquet had been let down at 33 minutes.  No significant   hemostasis was required.  The extensor mechanism was then reapproximated using #1 Vicryl and #1 Stratafix sutures with the knee   in flexion.  The   remaining wound was closed with 2-0 Vicryl and running 4-0 Monocryl.   The knee was cleaned,  dried, dressed sterilely using Dermabond and   Aquacel dressing.  The patient was then   brought to recovery room in stable condition, tolerating the procedure   well.   Please note that Physician Assistant, Lanney Gins, PA-C was present for the entirety of the case, and was utilized for pre-operative positioning, peri-operative retractor management, general facilitation of the procedure and for primary wound closure at the end of the case.              Madlyn Frankel Charlann Boxer, M.D.    10/07/2020 2:20 PM

## 2020-10-07 NOTE — Progress Notes (Signed)
Assisted Dr. Rob Fitzgerald with left, ultrasound guided, adductor canal block. Side rails up, monitors on throughout procedure. See vital signs in flow sheet. Tolerated Procedure well.  

## 2020-10-07 NOTE — Interval H&P Note (Signed)
History and Physical Interval Note:  10/07/2020 10:14 AM  Erica Frank  has presented today for surgery, with the diagnosis of Left knee osteoarthritis.  The various methods of treatment have been discussed with the patient and family. After consideration of risks, benefits and other options for treatment, the patient has consented to  Procedure(s) with comments: TOTAL KNEE ARTHROPLASTY (Left) - 70 mins as a surgical intervention.  The patient's history has been reviewed, patient examined, no change in status, stable for surgery.  I have reviewed the patient's chart and labs.  Questions were answered to the patient's satisfaction.     Shelda Pal

## 2020-10-08 ENCOUNTER — Encounter (HOSPITAL_COMMUNITY): Payer: Self-pay | Admitting: Orthopedic Surgery

## 2020-10-08 DIAGNOSIS — I25110 Atherosclerotic heart disease of native coronary artery with unstable angina pectoris: Secondary | ICD-10-CM | POA: Diagnosis not present

## 2020-10-08 DIAGNOSIS — I119 Hypertensive heart disease without heart failure: Secondary | ICD-10-CM | POA: Diagnosis not present

## 2020-10-08 DIAGNOSIS — Z7901 Long term (current) use of anticoagulants: Secondary | ICD-10-CM | POA: Diagnosis not present

## 2020-10-08 DIAGNOSIS — Z96652 Presence of left artificial knee joint: Secondary | ICD-10-CM | POA: Diagnosis not present

## 2020-10-08 DIAGNOSIS — Z79899 Other long term (current) drug therapy: Secondary | ICD-10-CM | POA: Diagnosis not present

## 2020-10-08 DIAGNOSIS — M1712 Unilateral primary osteoarthritis, left knee: Secondary | ICD-10-CM | POA: Diagnosis not present

## 2020-10-08 LAB — CBC
HCT: 35.4 % — ABNORMAL LOW (ref 36.0–46.0)
Hemoglobin: 11.6 g/dL — ABNORMAL LOW (ref 12.0–15.0)
MCH: 32.2 pg (ref 26.0–34.0)
MCHC: 32.8 g/dL (ref 30.0–36.0)
MCV: 98.3 fL (ref 80.0–100.0)
Platelets: 239 10*3/uL (ref 150–400)
RBC: 3.6 MIL/uL — ABNORMAL LOW (ref 3.87–5.11)
RDW: 12.8 % (ref 11.5–15.5)
WBC: 11.6 10*3/uL — ABNORMAL HIGH (ref 4.0–10.5)
nRBC: 0 % (ref 0.0–0.2)

## 2020-10-08 LAB — BASIC METABOLIC PANEL
Anion gap: 8 (ref 5–15)
BUN: 15 mg/dL (ref 8–23)
CO2: 23 mmol/L (ref 22–32)
Calcium: 8.8 mg/dL — ABNORMAL LOW (ref 8.9–10.3)
Chloride: 105 mmol/L (ref 98–111)
Creatinine, Ser: 0.74 mg/dL (ref 0.44–1.00)
GFR, Estimated: >60 mL/min (ref >60)
Glucose, Bld: 129 mg/dL — ABNORMAL HIGH (ref 70–99)
Potassium: 4.8 mmol/L (ref 3.5–5.1)
Sodium: 136 mmol/L (ref 135–145)

## 2020-10-08 NOTE — Discharge Summary (Signed)
Patient ID: Erica Frank MRN: [id number] DOB/AGE: 79-Aug-1942 79 y.o.  Admit date: 10/07/2020 Discharge date: 10/08/2020  Admission Diagnoses:  Principal Problem:   Left knee OA Active Problems:   S/P left TKA   Discharge Diagnoses:  Same  Past Medical History:  Diagnosis Date   Anginal pain (HCC) 1989   Anxiety    Arthritis    Atherosclerosis    Atrial fibrillation (HCC)    Coronary artery disease    Dysrhythmia    a-fib   GERD (gastroesophageal reflux disease)    Heart attack (HCC) 2020   Heart disease    High cholesterol    Hypertension     Surgeries: Procedure(s): LEFT TOTAL KNEE ARTHROPLASTY on 10/07/2020   Consultants: N/A  Discharged Condition: Improved  Hospital Course: Erica Frank is an 79 y.o. female who was admitted 10/07/2020 for operative treatment ofOsteoarthritis of left knee. Patient has severe unremitting pain that affects sleep, daily activities, and work/hobbies. After pre-op clearance the patient was taken to the operating room on 10/07/2020 and underwent  Procedure(s): LEFT TOTAL KNEE ARTHROPLASTY.    Patient was given perioperative antibiotics:  Anti-infectives (From admission, onward)   Start     Dose/Rate Route Frequency Ordered Stop   10/07/20 1800  ceFAZolin (ANCEF) IVPB 2g/100 mL premix        2 g 200 mL/hr over 30 Minutes Intravenous Every 6 hours 10/07/20 1311 10/08/20 0004   10/07/20 0915  ceFAZolin (ANCEF) IVPB 2g/100 mL premix        2 g 200 mL/hr over 30 Minutes Intravenous On call to O.R. 10/07/20 0913 10/07/20 1220       Patient was given sequential compression devices, early ambulation, and chemoprophylaxis to prevent DVT.  Patient benefited maximally from hospital stay and there were no complications.    Recent vital signs:  Patient Vitals for the past 24 hrs:  BP Temp Temp src Pulse Resp SpO2 Height Weight  10/08/20 0528 (!) 142/78 (!) 97.4 F (36.3 C) Oral 61 17 96 % -- --  10/08/20 0126 112/62  97.8 F (36.6 C) Oral (!) 57 16 92 % -- --  10/07/20 2127 (!) 122/59 (!) 97.4 F (36.3 C) Oral 64 17 94 % -- --  10/07/20 1757 136/65 (!) 97.4 F (36.3 C) Oral 67 16 96 % -- --  10/07/20 1635 125/70 97.7 F (36.5 C) Oral (!) 58 14 98 % -- --  10/07/20 1537 131/69 (!) 97.5 F (36.4 C) Oral (!) 56 16 98 % -- --  10/07/20 1515 115/69 -- -- (!) 57 15 99 % -- --  10/07/20 1500 115/60 -- -- (!) 56 11 99 % -- --  10/07/20 1445 117/61 -- -- (!) 55 12 99 % -- --  10/07/20 1430 120/60 -- -- (!) 54 12 98 % -- --  10/07/20 1415 116/69 97.9 F (36.6 C) -- (!) 53 13 99 % -- --  10/07/20 1400 (!) 117/59 -- -- (!) 55 16 98 % -- --  10/07/20 1345 118/61 -- -- (!) 56 11 96 % -- --  10/07/20 1330 115/60 -- -- (!) 55 12 99 % -- --  10/07/20 1315 (!) 107/58 -- -- (!) 59 20 100 % -- --  10/07/20 1313 (!) 110/59 97.8 F (36.6 C) -- (!) 59 20 100 % -- --  10/07/20 1107 122/66 -- -- (!) 52 20 97 % -- --  10/07/20 1052 114/65 -- -- (!) 51 20 97 % -- --  10/07/20 1047 105/64 -- -- (!) 49 19 97 % -- --  10/07/20 1042 118/66 -- -- (!) 48 20 98 % -- --  10/07/20 1037 119/64 -- -- (!) 50 19 97 % -- --  10/07/20 1032 122/67 -- -- (!) 49 19 98 % -- --  10/07/20 1027 128/65 -- -- (!) 49 (!) 25 97 % -- --  10/07/20 0945 -- -- -- -- -- -- 5\' 2"  (1.575 m) 61.4 kg  10/07/20 0922 131/71 98.9 F (37.2 C) Oral (!) 50 18 98 % -- --     Recent laboratory studies:  Recent Labs    10/08/20 0333  WBC 11.6*  HGB 11.6*  HCT 35.4*  PLT 239  NA 136  K 4.8  CL 105  CO2 23  BUN 15  CREATININE 0.74  GLUCOSE 129*  CALCIUM 8.8*     Discharge Medications:   Allergies as of 10/08/2020      Reactions   Repatha [evolocumab] Diarrhea      Medication List    STOP taking these medications   HYDROcodone-acetaminophen 5-325 MG tablet Commonly known as: NORCO/VICODIN Replaced by: HYDROcodone-acetaminophen 7.5-325 MG tablet     TAKE these medications   BIOTIN PO Take 1 tablet by mouth at bedtime.   CALCIUM 1200  PO Take 1 tablet by mouth daily.   Coenzyme Q10 100 MG capsule Take 100 mg by mouth daily.   docusate sodium 100 MG capsule Commonly known as: Colace Take 1 capsule (100 mg total) by mouth 2 (two) times daily.   Eliquis 5 MG Tabs tablet Generic drug: apixaban TAKE 1 TABLET(5 MG) BY MOUTH TWICE DAILY What changed: See the new instructions.   ezetimibe-simvastatin 10-40 MG tablet Commonly known as: VYTORIN TAKE 1 TABLET BY MOUTH DAILY   ferrous sulfate 325 (65 FE) MG tablet Commonly known as: FerrouSul Take 1 tablet (325 mg total) by mouth 3 (three) times daily with meals for 14 days.   HYDROcodone-acetaminophen 7.5-325 MG tablet Commonly known as: Norco Take 1-2 tablets by mouth every 4 (four) hours as needed for moderate pain. Replaces: HYDROcodone-acetaminophen 5-325 MG tablet   isosorbide mononitrate 60 MG 24 hr tablet Commonly known as: IMDUR Take 1 tablet (60 mg total) by mouth daily.   lisinopril 5 MG tablet Commonly known as: ZESTRIL Take 5 mg by mouth daily.   methocarbamol 500 MG tablet Commonly known as: Robaxin Take 1 tablet (500 mg total) by mouth every 6 (six) hours as needed for muscle spasms.   MULTIVITAMIN ADULT PO Take 1 tablet by mouth daily.   nitroGLYCERIN 0.4 MG SL tablet Commonly known as: NITROSTAT Place 1 tablet (0.4 mg total) under the tongue every 5 (five) minutes as needed for chest pain.   ondansetron 4 MG tablet Commonly known as: ZOFRAN Take 1 tablet (4 mg total) by mouth every 8 (eight) hours as needed for nausea or vomiting.   polyethylene glycol 17 g packet Commonly known as: MIRALAX / GLYCOLAX Take 17 g by mouth 2 (two) times daily.   TURMERIC PO Take 1 tablet by mouth once a week.   VITAMIN B 12 PO Take 1 tablet by mouth daily.            Durable Medical Equipment  (From admission, onward)         Start     Ordered   10/08/20 0823  For home use only DME 3 n 1  Once        10/08/20 [phone]  Discharge  Care Instructions  (From admission, onward)         Start     Ordered   10/08/20 0000  Change dressing       Comments: Maintain surgical dressing until follow up in the clinic. If the edges start to pull up, may reinforce with tape. If the dressing is no longer working, may remove and cover with gauze and tape, but must keep the area dry and clean.  Call with any questions or concerns.   10/08/20 0858   10/07/20 0000  Change dressing       Comments: Maintain surgical dressing until follow up in the clinic. If the edges start to pull up, may reinforce with tape. If the dressing is no longer working, may remove and cover with gauze and tape, but must keep the area dry and clean.  Call with any questions or concerns.   10/07/20 1111          Diagnostic Studies: CT ABDOMEN PELVIS W CONTRAST  Result Date: 09/18/2020 CLINICAL DATA:  Abdominal pain with history of diverticulitis EXAM: CT ABDOMEN AND PELVIS WITH CONTRAST TECHNIQUE: Multidetector CT imaging of the abdomen and pelvis was performed using the standard protocol following bolus administration of intravenous contrast. CONTRAST:  OMNIPAQUE IOHEXOL 300 MG/ML  SOLN COMPARISON:  08/21/2019 FINDINGS: Lower chest: No acute abnormality. Hepatobiliary: Mild fatty infiltration of the liver is noted. The gallbladder is within normal limits. Pancreas: Unremarkable. No pancreatic ductal dilatation or surrounding inflammatory changes. Spleen: Normal in size without focal abnormality. Adrenals/Urinary Tract: Adrenal glands are within normal limits. Kidneys demonstrate a normal enhancement pattern bilaterally. No renal calculi or obstructive changes are seen. Delayed images demonstrate normal excretion of contrast. The bladder is well distended. Stomach/Bowel: Scattered diverticular changes noted throughout the colon. Minimal residual inflammatory change consistent with diverticulitis is noted although improved from the prior exam. Some prominent fecal  material is noted within the sigmoid. No obstructive changes are noted proximally. The appendix is well visualized and within normal limits. No small bowel or gastric abnormality is seen. Vascular/Lymphatic: Aortic atherosclerosis. No enlarged abdominal or pelvic lymph nodes. Reproductive: Status post hysterectomy. No adnexal masses. Other: No abdominal wall hernia or abnormality. No abdominopelvic ascites. Musculoskeletal: Degenerative changes are noted. No acute bony abnormality is seen. IMPRESSION: Persistent but improved diverticulitis in the sigmoid colon. Prominent fecal material in the sigmoid is noted which may contribute to the patient's discomfort. No evidence of perforation or focal abscess is seen. Electronically Signed   By: Alcide Clever M.D.   On: 09/18/2020 18:54    Disposition: HOME  Discharge Instructions    Call MD / Call 911   Complete by: As directed    If you experience chest pain or shortness of breath, CALL 911 and be transported to the hospital emergency room.  If you develope a fever above 101 F, pus (white drainage) or increased drainage or redness at the wound, or calf pain, call your surgeon's office.   Call MD / Call 911   Complete by: As directed    If you experience chest pain or shortness of breath, CALL 911 and be transported to the hospital emergency room.  If you develope a fever above 101 F, pus (white drainage) or increased drainage or redness at the wound, or calf pain, call your surgeon's office.   Change dressing   Complete by: As directed    Maintain surgical dressing until follow up in the clinic. If the edges start  to pull up, may reinforce with tape. If the dressing is no longer working, may remove and cover with gauze and tape, but must keep the area dry and clean.  Call with any questions or concerns.   Change dressing   Complete by: As directed    Maintain surgical dressing until follow up in the clinic. If the edges start to pull up, may reinforce with  tape. If the dressing is no longer working, may remove and cover with gauze and tape, but must keep the area dry and clean.  Call with any questions or concerns.   Constipation Prevention   Complete by: As directed    Drink plenty of fluids.  Prune juice may be helpful.  You may use a stool softener, such as Colace (over the counter) 100 mg twice a day.  Use MiraLax (over the counter) for constipation as needed.   Constipation Prevention   Complete by: As directed    Drink plenty of fluids.  Prune juice may be helpful.  You may use a stool softener, such as Colace (over the counter) 100 mg twice a day.  Use MiraLax (over the counter) for constipation as needed.   Diet - low sodium heart healthy   Complete by: As directed    Diet - low sodium heart healthy   Complete by: As directed    Discharge instructions   Complete by: As directed    Maintain surgical dressing until follow up in the clinic. If the edges start to pull up, may reinforce with tape. If the dressing is no longer working, may remove and cover with gauze and tape, but must keep the area dry and clean.  Follow up in 2 weeks at [facility]. Call with any questions or concerns.   Discharge instructions   Complete by: As directed    Maintain surgical dressing until follow up in the clinic. If the edges start to pull up, may reinforce with tape. If the dressing is no longer working, may remove and cover with gauze and tape, but must keep the area dry and clean.  Follow up in 2 weeks at [facility]. Call with any questions or concerns.   Increase activity slowly as tolerated   Complete by: As directed    Weight bearing as tolerated with assist device (walker, cane, etc) as directed, use it as long as suggested by your surgeon or therapist, typically at least 4-6 weeks.   Increase activity slowly as tolerated   Complete by: As directed    Weight bearing as tolerated with assist device (walker, cane, etc) as directed, use it as long as  suggested by your surgeon or therapist, typically at least 4-6 weeks.   TED hose   Complete by: As directed    Use stockings (TED hose) for 2 weeks on both leg(s).  You may remove them at night for sleeping.   TED hose   Complete by: As directed    Use stockings (TED hose) for 2 weeks on both leg(s).  You may remove them at night for sleeping.       Follow-up Information    Durene Romans, MD. Schedule an appointment as soon as possible for a visit in 2 weeks.   Specialty: Orthopedic Surgery Contact information: [address] [place] [address] [phone]                Signed: Genelle Gather [facility] 10/08/2020, 8:59 AM

## 2020-10-08 NOTE — Progress Notes (Signed)
     Subjective: 1 Day Post-Op Procedure(s) (LRB): TOTAL KNEE ARTHROPLASTY (Left)    Seen by Dr. Charlann Boxer. Patient reports pain as mild, pain controlled.  No reported events throughout the night.  Dr. Charlann Boxer discussed the procedure, findings and expectations moving forward.  Ready to be discharged home, if they do well with PT.  Follow up in the clinic in 2 weeks.  Knows to call with any questions or concerns.     Patient's anticipated LOS is less than 2 midnights, meeting these requirements: - Lives within 1 hour of care - Has a competent adult at home to recover with post-op recover - NO history of  - Chronic pain requiring opiods  - Diabetes  - Heart failure  - Stroke  - DVT/VTE  - Respiratory Failure/COPD  - Renal failure  - Anemia  - Advanced Liver disease       Objective:   VITALS:   Vitals:   10/08/20 0126 10/08/20 0528  BP: 112/62 (!) 142/78  Pulse: (!) 57 61  Resp: 16 17  Temp: 97.8 F (36.6 C) (!) 97.4 F (36.3 C)  SpO2: 92% 96%    Dorsiflexion/Plantar flexion intact Incision: dressing C/D/I No cellulitis present Compartment soft  LABS Recent Labs    10/08/20 0333  HGB 11.6*  HCT 35.4*  WBC 11.6*  PLT 239    Recent Labs    10/08/20 0333  NA 136  K 4.8  BUN 15  CREATININE 0.74  GLUCOSE 129*     Assessment/Plan: 1 Day Post-Op Procedure(s) (LRB): TOTAL KNEE ARTHROPLASTY (Left) Foley cath d/c'ed Advance diet Up with therapy D/C IV fluids Discharge home Follow up in 2 weeks at [facility] Follow up with OLIN,Akul Leggette D in 2 weeks.  Contact information:  [facility] [address], [address] [place] [address] [phone]        Lanney Gins PA-C  [facility]  [place] [address], [place], [place] [address] Phone: [phone] [email or web address] Facebook  [employer]

## 2020-10-08 NOTE — Plan of Care (Signed)
  Problem: Education: Goal: Knowledge of General Education information will improve Description: Including pain rating scale, medication(s)/side effects and non-pharmacologic comfort measures Outcome: Progressing   Problem: Pain Managment: Goal: General experience of comfort will improve Outcome: Progressing   Problem: Safety: Goal: Ability to remain free from injury will improve Outcome: Progressing   Problem: Pain Management: Goal: Pain level will decrease with appropriate interventions Outcome: Progressing

## 2020-10-08 NOTE — Progress Notes (Signed)
Physical Therapy Treatment Patient Details Name: Erica Frank MRN: [id number] DOB: [birth year] Today's Date: 10/08/2020    History of Present Illness Patient is 79 y.o. female s/p Lt TKA on 10/07/20 with PMH significant for HTN, CAD, MI (2020), GERD, A-fib, OA, anxiety.    PT Comments    Pt assisted to bathroom and then ambulated in hallway. Pt reports more soreness today. Pt plans to d/c home later today; will return to review HEP.   Follow Up Recommendations  Follow surgeons recommendation for DC plan and follow-up therapies;Outpatient PT     Equipment Recommendations  Rolling walker with 5" wheels;3in1 (PT)    Recommendations for Other Services       Precautions / Restrictions Precautions Precautions: Fall;Knee Restrictions LLE Weight Bearing: Weight bearing as tolerated    Mobility  Bed Mobility Overal bed mobility: Needs Assistance Bed Mobility: Supine to Sit     Supine to sit: Supervision        Transfers Overall transfer level: Needs assistance Equipment used: Rolling walker (2 wheeled) Transfers: Sit to/from Stand Sit to Stand: Min guard         General transfer comment: verbal cues for UE and LE positioning  Ambulation/Gait Ambulation/Gait assistance: Min guard Gait Distance (Feet): 60 Feet Assistive device: Rolling walker (2 wheeled) Gait Pattern/deviations: Step-to pattern;Decreased stride length;Decreased weight shift to left;Antalgic     General Gait Details: verbal cues for sequence, RW positioning, step length, distance to tolerance   Stairs             Wheelchair Mobility    Modified Rankin (Stroke Patients Only)       Balance                                            Cognition Arousal/Alertness: Awake/alert Behavior During Therapy: WFL for tasks assessed/performed Overall Cognitive Status: Within Functional Limits for tasks assessed                                         Exercises      General Comments        Pertinent Vitals/Pain Pain Assessment: 0-10 Pain Score: 5  Pain Location: Lt knee Pain Descriptors / Indicators: Aching;Discomfort;Dull;Sore Pain Intervention(s): Repositioned;Monitored during session    Home Living                      Prior Function            PT Goals (current goals can now be found in the care plan section) Progress towards PT goals: Progressing toward goals    Frequency    7X/week      PT Plan Current plan remains appropriate    Co-evaluation              AM-PAC PT "6 Clicks" Mobility   Outcome Measure  Help needed turning from your back to your side while in a flat bed without using bedrails?: A Little Help needed moving from lying on your back to sitting on the side of a flat bed without using bedrails?: A Little Help needed moving to and from a bed to a chair (including a wheelchair)?: A Little Help needed standing up from a chair using your arms (e.g., wheelchair or bedside chair)?:  A Little Help needed to walk in hospital room?: A Little Help needed climbing 3-5 steps with a railing? : A Little 6 Click Score: 18    End of Session Equipment Utilized During Treatment: Gait belt Activity Tolerance: Patient tolerated treatment well Patient left: in chair;with call bell/phone within reach;with family/visitor present   PT Visit Diagnosis: Muscle weakness (generalized) (M62.81);Difficulty in walking, not elsewhere classified (R26.2)     Time: 3354-5625 PT Time Calculation (min) (ACUTE ONLY): 20 min  Charges:  $Gait Training: 8-22 mins                     Paulino Door, DPT Acute Rehabilitation Services Pager: [phone] Office: [phone]  Maida Sale E 10/08/2020, 3:27 PM

## 2020-10-08 NOTE — TOC Transition Note (Signed)
Transition of Care [facility]) - CM/SW Discharge Note   Patient Details  Name: Erica Frank MRN: [id number] Date of Birth: 1941-10-21  [facility] CM/SW Contact:  Clearance Coots, LCSW Phone Number: 10/08/2020, 11:29 AM   Clinical Narrative:    Therapy Plan:OPPT 3 IN1 and RW delivered to the patient bedside by [employer]   Final next level of care: OP Rehab Barriers to Discharge: No Barriers Identified   Patient Goals and CMS Choice        Discharge Placement                       Discharge Plan and Services                DME Arranged: 3-N-1, Walker rolling DME Agency: [employer] Date DME Agency Contacted: 10/08/20 Time DME Agency Contacted: 0900 Representative spoke with at DME Agency: Harrold Donath            Social Determinants of Health (SDOH) Interventions     Readmission Risk Interventions No flowsheet data found.

## 2020-10-08 NOTE — Progress Notes (Signed)
Physical Therapy Treatment Patient Details Name: Erica Frank MRN: [id number] DOB: [birth year] Today's Date: 10/08/2020    History of Present Illness Patient is 79 y.o. female s/p Lt TKA on 10/07/20 with PMH significant for HTN, CAD, MI (2020), GERD, A-fib, OA, anxiety.    PT Comments    Pt performed LE exercises with HEP handout. Pt also assisted to bathroom and ambulated in hallway again.  Pt's questions answered within scope of practice, and pt ready to d/c home today.   Follow Up Recommendations  Follow surgeon's recommendation for DC plan and follow-up therapies;Outpatient PT     Equipment Recommendations  Rolling walker with 5" wheels;3in1 (PT)    Recommendations for Other Services       Precautions / Restrictions Precautions Precautions: Fall;Knee Restrictions LLE Weight Bearing: Weight bearing as tolerated    Mobility  Bed Mobility Overal bed mobility: Needs Assistance Bed Mobility: Supine to Sit;Sit to Supine     Supine to sit: Supervision Sit to supine: Supervision      Transfers Overall transfer level: Needs assistance Equipment used: Rolling walker (2 wheeled) Transfers: Sit to/from Stand Sit to Stand: Supervision         General transfer comment: verbal cues for UE and LE positioning  Ambulation/Gait Ambulation/Gait assistance: Min guard;Supervision Gait Distance (Feet): 40 Feet Assistive device: Rolling walker (2 wheeled) Gait Pattern/deviations: Step-to pattern;Decreased stride length;Decreased weight shift to left;Antalgic Gait velocity: decr   General Gait Details: verbal cues for sequence, RW positioning, step length, distance to tolerance   Stairs             Wheelchair Mobility    Modified Rankin (Stroke Patients Only)       Balance                                            Cognition Arousal/Alertness: Awake/alert Behavior During Therapy: WFL for tasks assessed/performed Overall Cognitive  Status: Within Functional Limits for tasks assessed                                        Exercises Total Joint Exercises Ankle Circles/Pumps: AROM;Both;10 reps Quad Sets: AROM;Left;10 reps Short Arc QuadBarbaraann Boys;Left;10 reps Heel Slides: AAROM;Left;10 reps Hip ABduction/ADduction: AROM;Left;10 reps Straight Leg Raises: AAROM;Left;10 reps Knee Flexion: AROM;5 reps;Left;Seated    General Comments        Pertinent Vitals/Pain Pain Assessment: 0-10 Pain Score: 6  Pain Location: Lt knee Pain Descriptors / Indicators: Aching;Discomfort;Dull;Sore Pain Intervention(s): Monitored during session;Repositioned;Patient requesting pain meds-RN notified    Home Living                      Prior Function            PT Goals (current goals can now be found in the care plan section) Progress towards PT goals: Progressing toward goals    Frequency    7X/week      PT Plan Current plan remains appropriate    Co-evaluation              AM-PAC PT "6 Clicks" Mobility   Outcome Measure  Help needed turning from your back to your side while in a flat bed without using bedrails?: A Little Help needed moving from lying on your back  to sitting on the side of a flat bed without using bedrails?: A Little Help needed moving to and from a bed to a chair (including a wheelchair)?: A Little Help needed standing up from a chair using your arms (e.g., wheelchair or bedside chair)?: A Little Help needed to walk in hospital room?: A Little Help needed climbing 3-5 steps with a railing? : A Little 6 Click Score: 18    End of Session Equipment Utilized During Treatment: Gait belt Activity Tolerance: Patient tolerated treatment well Patient left: in bed;with call bell/phone within reach;with family/visitor present Nurse Communication: Mobility status PT Visit Diagnosis: Muscle weakness (generalized) (M62.81);Difficulty in walking, not elsewhere classified (R26.2)      Time: 6283-1517 PT Time Calculation (min) (ACUTE ONLY): 31 min  Charges:  $Gait Training: 8-22 mins $Therapeutic Exercise: 8-22 mins                     Thomasene Mohair PT, DPT Acute Rehabilitation Services Pager: [phone] Office: [phone]  Maida Sale E 10/08/2020, 3:32 PM

## 2020-10-08 NOTE — Progress Notes (Signed)
Patient discharged to home w/ dtr. Given all belongings, instructions, equipment. Verbalized understanding of all instructions. Escorted to pov via w/c. 

## 2020-10-10 DIAGNOSIS — M25562 Pain in left knee: Secondary | ICD-10-CM | POA: Diagnosis not present

## 2020-10-16 DIAGNOSIS — F419 Anxiety disorder, unspecified: Secondary | ICD-10-CM | POA: Diagnosis not present

## 2020-10-16 DIAGNOSIS — I2510 Atherosclerotic heart disease of native coronary artery without angina pectoris: Secondary | ICD-10-CM | POA: Diagnosis not present

## 2020-10-16 DIAGNOSIS — Z7901 Long term (current) use of anticoagulants: Secondary | ICD-10-CM | POA: Diagnosis not present

## 2020-10-16 DIAGNOSIS — I252 Old myocardial infarction: Secondary | ICD-10-CM | POA: Diagnosis not present

## 2020-10-16 DIAGNOSIS — D696 Thrombocytopenia, unspecified: Secondary | ICD-10-CM | POA: Diagnosis not present

## 2020-10-16 DIAGNOSIS — Z471 Aftercare following joint replacement surgery: Secondary | ICD-10-CM | POA: Diagnosis not present

## 2020-10-16 DIAGNOSIS — Z96652 Presence of left artificial knee joint: Secondary | ICD-10-CM | POA: Diagnosis not present

## 2020-10-16 DIAGNOSIS — I119 Hypertensive heart disease without heart failure: Secondary | ICD-10-CM | POA: Diagnosis not present

## 2020-10-16 DIAGNOSIS — I480 Paroxysmal atrial fibrillation: Secondary | ICD-10-CM | POA: Diagnosis not present

## 2020-10-16 DIAGNOSIS — D649 Anemia, unspecified: Secondary | ICD-10-CM | POA: Diagnosis not present

## 2020-10-16 DIAGNOSIS — E782 Mixed hyperlipidemia: Secondary | ICD-10-CM | POA: Diagnosis not present

## 2020-10-16 DIAGNOSIS — K219 Gastro-esophageal reflux disease without esophagitis: Secondary | ICD-10-CM | POA: Diagnosis not present

## 2020-10-27 DIAGNOSIS — M25562 Pain in left knee: Secondary | ICD-10-CM | POA: Diagnosis not present

## 2020-10-29 DIAGNOSIS — M25562 Pain in left knee: Secondary | ICD-10-CM | POA: Diagnosis not present

## 2020-11-03 DIAGNOSIS — M25562 Pain in left knee: Secondary | ICD-10-CM | POA: Diagnosis not present

## 2020-11-05 ENCOUNTER — Ambulatory Visit: Payer: Medicare Other | Admitting: Cardiology

## 2020-11-05 ENCOUNTER — Other Ambulatory Visit: Payer: Self-pay

## 2020-11-05 ENCOUNTER — Encounter: Payer: Self-pay | Admitting: Cardiology

## 2020-11-05 ENCOUNTER — Ambulatory Visit: Payer: Medicare Other

## 2020-11-05 VITALS — BP 133/61 | HR 54 | Resp 16 | Ht 62.0 in | Wt 134.0 lb

## 2020-11-05 DIAGNOSIS — I10 Essential (primary) hypertension: Secondary | ICD-10-CM | POA: Diagnosis not present

## 2020-11-05 DIAGNOSIS — E782 Mixed hyperlipidemia: Secondary | ICD-10-CM

## 2020-11-05 DIAGNOSIS — I6523 Occlusion and stenosis of bilateral carotid arteries: Secondary | ICD-10-CM

## 2020-11-05 DIAGNOSIS — I480 Paroxysmal atrial fibrillation: Secondary | ICD-10-CM | POA: Diagnosis not present

## 2020-11-05 DIAGNOSIS — I25118 Atherosclerotic heart disease of native coronary artery with other forms of angina pectoris: Secondary | ICD-10-CM | POA: Diagnosis not present

## 2020-11-05 DIAGNOSIS — M25562 Pain in left knee: Secondary | ICD-10-CM | POA: Diagnosis not present

## 2020-11-05 NOTE — Progress Notes (Signed)
Primary Physician/Referring:  Prince Solian, MD  Patient ID: Erica Frank, female    DOB: [birth year], 79 y.o.   MRN: [id number]  Chief Complaint  Patient presents with  . Coronary Artery Disease  . PAF  . Follow-up    6 month   HPI:    Erica Frank  is a 79 y.o. female  with hypertension, hyperlipidemia, and paroxysmal atrial fibrillation on 01/25/2019 S/P cardioversion in the ED, CAD and balloon angioplasty in [place], NSTEMI on 07/09/2019 S/P stenting to the mid LAD. She did not tolerate Repatha and has discontinued this and she did not refill WelChol due to cost.  She is presently on Vytorin.  Her last evaluation in the ED was on 01/16/2020 with vasovagal syncope.  She continues to have near vasovagal spells with bowel movement in the morning, states that these have improved over the past 2 months. She had left knee replacement this month on 10/07/2020 and is presently in rehab.  She still has significant amount of pain and discomfort.  She has not had any chest pain, dyspnea, TIA-like symptoms or claudication.  Past Medical History:  Diagnosis Date  . Anginal pain ([place]) 1989  . Anxiety   . Arthritis   . Atherosclerosis   . Atrial fibrillation ([place])   . Coronary artery disease   . Dysrhythmia    a-fib  . GERD (gastroesophageal reflux disease)   . Heart attack ([place]) 2020  . Heart disease   . High cholesterol   . Hypertension    Past Surgical History:  Procedure Laterality Date  . ABDOMINAL HYSTERECTOMY  1983   for endometriosis with [place]  . ANGIOPLASTY    . ANGIOPLASTY    . BACK SURGERY    . CARDIAC SURGERY     Catheterization  . CORONARY STENT INTERVENTION N/A 07/09/2019   Procedure: CORONARY STENT INTERVENTION;  Surgeon: Adrian Prows, MD;  Location: [facility];  Service: Cardiovascular;  Laterality: N/A;  . LEFT HEART CATH AND CORONARY ANGIOGRAPHY N/A 07/09/2019   Procedure: LEFT HEART CATH AND CORONARY ANGIOGRAPHY and possible intervention;   Surgeon: Adrian Prows, MD;  Location: [facility];  Service: Cardiovascular;  Laterality: N/A;  4: or 4:30 PM today  . NECK SURGERY    . TOTAL KNEE ARTHROPLASTY Left 10/07/2020   Procedure: TOTAL KNEE ARTHROPLASTY;  Surgeon: Paralee Cancel, MD;  Location: [facility];  Service: Orthopedics;  Laterality: Left;  70 mins   Family History  Problem Relation Age of Onset  . Uterine cancer Mother   . Hypertension Brother   . Heart disease Brother   . Breast cancer Cousin     Social History   Tobacco Use  . Smoking status: Never Smoker  . Smokeless tobacco: Never Used  Substance Use Topics  . Alcohol use: Yes    Alcohol/week: 3.0 standard drinks    Types: 3 Glasses of wine per week    Comment: Occasional   Marital Status: Widowed  ROS  Review of Systems  Cardiovascular: Negative for dyspnea on exertion, leg swelling and syncope.  Musculoskeletal: Positive for arthritis (bilateral knee).  Gastrointestinal: Negative for melena.   Objective  Blood pressure 133/61, pulse (!) 54, resp. rate 16, height '5\' 2"'  (1.575 m), weight 134 lb (60.8 kg), SpO2 99 %.  Vitals with BMI 11/05/2020 10/08/2020 10/08/2020  Height '5\' 2"'  - -  Weight 134 lbs - -  BMI 61.4 - -  Systolic 431 540 086  Diastolic 61  76 55  Pulse 54 60 64     Physical Exam Constitutional:      General: She is not in acute distress.    Appearance: She is well-developed.  Cardiovascular:     Rate and Rhythm: Normal rate and regular rhythm.     Pulses: Intact distal pulses.          Carotid pulses are on the right side with bruit.    Heart sounds: No murmur heard.  No gallop.      Comments: No leg edema, no JVD.  Pulmonary:     Effort: Pulmonary effort is normal. No accessory muscle usage.     Breath sounds: Normal breath sounds.  Abdominal:     General: Bowel sounds are normal.     Palpations: Abdomen is soft.    Laboratory examination:   Recent Labs    01/17/20 0507 01/17/20 0507 06/19/20 1818 06/19/20 1818  08/19/20 1047 08/19/20 1047 09/18/20 1636 09/29/20 0931 10/08/20 0333  NA 140   < > 136   < > 137   < > 139 139 136  K 4.1   < > 4.4   < > 3.8   < > 5.0 4.3 4.8  CL 109   < > 102   < > 103   < > 107 104 105  CO2 25   < > 26   < > 21*   < > '23 26 23  ' GLUCOSE 95   < > 106*   < > 118*   < > 111* 90 129*  BUN 16   < > 19   < > 18   < > '15 19 15  ' CREATININE 0.90   < > 1.00   < > 0.81   < > 0.94 0.95 0.74  CALCIUM 8.8*   < > 9.2   < > 9.3   < > 8.8* 9.0 8.8*  GFRNONAA >60   < > 54*   < > >60  --  58* >60 >60  GFRAA >60  --  >60  --  >60  --   --   --   --    < > = values in this interval not displayed.   CrCl cannot be calculated (Patient's most recent lab result is older than the maximum 21 days allowed.).  CMP Latest Ref Rng & Units 10/08/2020 09/29/2020 09/18/2020  Glucose 70 - 99 mg/dL 129(H) 90 111(H)  BUN 8 - 23 mg/dL '15 19 15  ' Creatinine 0.44 - 1.00 mg/dL 0.74 0.95 0.94  Sodium 135 - 145 mmol/L 136 139 139  Potassium 3.5 - 5.1 mmol/L 4.8 4.3 5.0  Chloride 98 - 111 mmol/L 105 104 107  CO2 22 - 32 mmol/L '23 26 23  ' Calcium 8.9 - 10.3 mg/dL 8.8(L) 9.0 8.8(L)  Total Protein 6.5 - 8.1 g/dL - - 5.8(L)  Total Bilirubin 0.3 - 1.2 mg/dL - - 1.1  Alkaline Phos 38 - 126 U/L - - 47  AST 15 - 41 U/L - - 33  ALT 0 - 44 U/L - - 17   CBC Latest Ref Rng & Units 10/08/2020 09/29/2020 09/18/2020  WBC 4.0 - 10.5 K/uL 11.6(H) 7.6 10.2  Hemoglobin 12.0 - 15.0 g/dL 11.6(L) 13.4 13.3  Hematocrit 36 - 46 % 35.4(L) 40.5 40.0  Platelets 150 - 400 K/uL 239 342 214   Lipid Panel     Component Value Date/Time   CHOL 155 04/29/2020 0817   TRIG 91 04/29/2020  0817   HDL 69 04/29/2020 0817   LDLCALC 69 04/29/2020 0817   HEMOGLOBIN A1C No results found for: HGBA1C, MPG TSH Recent Labs    01/17/20 0507  TSH 0.899    External labs:   11/03/2018:  Glucose 97, BUN/Cr 25/0.9, eGFR 60.9/73.7, Na/K 141/4.6, AST 22, ALT 19, Alk Phos 55, Bil Tot 0.6, Apo B 63. Rest of CMP normal.  Chol 155, Trig 63, HDL  71, LDL 71. TSH Normal.   Medications and allergies   Allergies  Allergen Reactions  . Repatha [Evolocumab] Diarrhea    Current Outpatient Medications on File Prior to Visit  Medication Sig Dispense Refill  . BIOTIN PO Take 1 tablet by mouth at bedtime.    . Coenzyme Q10 100 MG capsule Take 100 mg by mouth daily.     . Cyanocobalamin (VITAMIN B 12 PO) Take 1 tablet by mouth daily.     Marland Kitchen ELIQUIS 5 MG TABS tablet TAKE 1 TABLET(5 MG) BY MOUTH TWICE DAILY (Patient taking differently: Take 5 mg by mouth 2 (two) times daily. ) 180 tablet 1  . ezetimibe-simvastatin (VYTORIN) 10-40 MG tablet TAKE 1 TABLET BY MOUTH DAILY 90 tablet 1  . isosorbide mononitrate (IMDUR) 60 MG 24 hr tablet Take 1 tablet (60 mg total) by mouth daily. 90 tablet 3  . lisinopril (ZESTRIL) 5 MG tablet Take 5 mg by mouth daily.  90 tablet 3  . methocarbamol (ROBAXIN) 500 MG tablet Take 1 tablet (500 mg total) by mouth every 6 (six) hours as needed for muscle spasms. 40 tablet 0  . Multiple Vitamins-Minerals (MULTIVITAMIN ADULT PO) Take 1 tablet by mouth daily.     . nitroGLYCERIN (NITROSTAT) 0.4 MG SL tablet Place 1 tablet (0.4 mg total) under the tongue every 5 (five) minutes as needed for chest pain. 25 tablet 11  . polyethylene glycol (MIRALAX / GLYCOLAX) 17 g packet Take 17 g by mouth 2 (two) times daily. 28 packet 0  . TURMERIC PO Take 1 tablet by mouth once a week.     . Calcium Carbonate-Vit D-Min (CALCIUM 1200 PO) Take 1 tablet by mouth daily.  (Patient not taking: Reported on 11/05/2020)     No current facility-administered medications on file prior to visit.    Radiology:   CT Head Wo Contrast 01/16/2020: No acute intracranial abnormality noted. Diffuse mucosal thickening within the right maxillary antrum.  Cardiac Studies:   Coronary Angiography & Balloon Angioplasty of LAD in 1989 and 1990.  DCCV 01/25/2019: 120J x 1 to NSR.  Lexiscan myoview stress test 02/26/2019:  1. Lexiscan stress test was  performed. Exercise capacity was not assessed. No stress symptoms reported. Resting blood pressure was 110/70 mmHg and peak effect blood pressure was 146/60 mmHg. The resting and stress electrocardiogram demonstrated normal sinus rhythm, normal  conduction, occasional PAC, and normal repolarization.   Stress EKG is non diagnostic for ischemia as it is a pharmacologic stress.  2. The overall quality of the study is good. There is no evidence of abnormal lung activity. Stress and rest SPECT images demonstrate homogeneous tracer distribution throughout the myocardium. Gated SPECT imaging reveals normal myocardial thickening and wall motion. The left ventricular ejection fraction was normal (52%).   3. Low risk study.  Coronary Angiography 07/09/2019: Moderate amount of diffuse coronary calcification noted in all coronary vessels including left main. Right coronary artery and ramus intermediate showed mid 30% stenosis. Mid LAD had a focal moderately calcified high-grade 95% stenosis S/P STENT SYNERGY DES 3X24.  Maximum pressure: 16 atm. Inflation time: 75 sec. Stent strut is well apposed. There was residual 0% stenosis with maintenance of TIMI-3 TIMI-3 flow  Echocardiogram 04/23/2019: Normal LV systolic function with EF 59%. Left ventricle cavity is normal in size. Normal global wall motion. Calculated EF 59%. Moderate (Grade II) mitral regurgitation. E-wave dominant mitral inflow. Mild tricuspid regurgitation. No evidence of pulmonary hypertension.  Event monitor 02/01/2020 - 03/01/2020.  Baseline sample showed Sinus Rhythm w/Artifact with a heart rate of 61.5 bpm. There were 1 critical, 0 serious, and 5 stable events that occurred.  4 patient activated events were accidental push or no symptoms reported revealed normal sinus rhythm. There was a 11 beat NSVT noted at the rate of 178 bpm on 02/04/2020 at 5:41 AM.  Otherwise no heart block, no atrial fibrillation, rare PVCs noted.  Carotid artery duplex  11/05/2020: Stenosis in the right internal carotid artery (50-69%). Stenosis in the right external carotid artery (>50%). Stenosis in the left internal carotid artery (16-49%). Stenosis in the left external carotid artery (<50%). Antegrade right vertebral artery flow. Antegrade left vertebral artery flow. No significant change since 05/08/2020. Follow up in six months is appropriate if clinically indicated.   EKG   EKG 11/05/2020: Marked sinus bradycardia at rate of 52 bpm, normal axis, incomplete right bundle branch block.  Nonspecific T abnormality.  No evidence of ischemia, normal QT interval.   No significant change from 05/06/2020.  Assessment     ICD-10-CM   1. Coronary artery disease of native artery of native heart with stable angina pectoris ([place])  I25.118 Lipid Panel With LDL/HDL Ratio  2. Asymptomatic bilateral carotid artery stenosis  I65.23 PCV CAROTID DUPLEX (BILATERAL)  3. Paroxysmal atrial fibrillation ([place]). CHA2DS2-VASc Score is 5.  Yearly risk of stroke: 6.7% (A, F, HTN, Vasc Dz).   I48.0   4. Mixed hyperlipidemia  E78.2 Lipid Panel With LDL/HDL Ratio  5. Essential hypertension  I10 EKG 12-Lead    No orders of the defined types were placed in this encounter.   Medications Discontinued During This Encounter  Medication Reason  . docusate sodium (COLACE) 100 MG capsule Patient Preference  . ferrous sulfate (FERROUSUL) 325 (65 FE) MG tablet Patient Preference  . HYDROcodone-acetaminophen (NORCO) 7.5-325 MG tablet Patient Preference  . ondansetron (ZOFRAN) 4 MG tablet Patient Preference     Recommendations:   Ms. Erica Frank  is a 79 y.o. female  with hypertension, hyperlipidemia, and paroxysmal atrial fibrillation on 01/25/2019 S/P cardioversion in the ED, CAD and balloon angioplasty in 1989 and 1990, NSTEMI on 07/09/2019 S/P stenting to the mid LAD. She did not tolerate Repatha and has discontinued this and she did not refill WelChol due to cost.  She is presently  on Vytorin.  Counterpressure maneuver again discussed with the patient, fortunately over the past 2 months she has not had episodes of near syncope.  She underwent left knee replacement 1 month ago on 10/07/2020 with no perioperative cardiac complications.  She had discontinued Repatha thinking that it caused her to have severe diarrhea needing hospital admission.  But now she is having second thoughts as she is concerned about progression of coronary disease and carotid disease, I will check lipid profile testing today and if LDL is still elevated, she is willing to try Repatha or probably try Praluent.  Otherwise blood pressure is well controlled, she has not had any recurrence of angina pectoris.  She will need continued surveillance of moderate carotid artery stenosis, stable since study  last 6 months ago.  I will see her back in 6 months for follow-up.    Adrian Prows, MD, [facility] 11/05/2020, 9:37 PM Office: [phone] Pager: [phone]

## 2020-11-06 ENCOUNTER — Telehealth: Payer: Self-pay

## 2020-11-06 NOTE — Telephone Encounter (Signed)
Returned patient's call regarding questions about her carotid artery duplex results.  Patient's concerns and questions were addressed to her satisfaction.  Reassured her carotid artery duplex is stable and at this time she does not need to do anything differently, will continue to manage cardiovascular risk factors.  Also reassured patient that we will notify her of results of lipid profile testing which she will have done tomorrow.   Rayford Halsted, PA-C 11/06/2020, 1:59 PM Office: [phone]

## 2020-11-06 NOTE — Telephone Encounter (Signed)
VM [phone] : Patient called and left a message on VMbox requesting that you please give her a call.   [phone]

## 2020-11-07 DIAGNOSIS — M25562 Pain in left knee: Secondary | ICD-10-CM | POA: Diagnosis not present

## 2020-11-07 DIAGNOSIS — E782 Mixed hyperlipidemia: Secondary | ICD-10-CM | POA: Diagnosis not present

## 2020-11-07 DIAGNOSIS — I25118 Atherosclerotic heart disease of native coronary artery with other forms of angina pectoris: Secondary | ICD-10-CM | POA: Diagnosis not present

## 2020-11-08 LAB — LIPID PANEL WITH LDL/HDL RATIO
Cholesterol, Total: 172 mg/dL (ref 100–199)
HDL: 50 mg/dL (ref >39)
LDL Chol Calc (NIH): 94 mg/dL (ref 0–99)
LDL/HDL Ratio: 1.9 ratio (ref 0.0–3.2)
Triglycerides: 165 mg/dL — ABNORMAL HIGH (ref 0–149)
VLDL Cholesterol Cal: 28 mg/dL (ref 5–40)

## 2020-11-10 DIAGNOSIS — M25562 Pain in left knee: Secondary | ICD-10-CM | POA: Diagnosis not present

## 2020-11-12 DIAGNOSIS — M25562 Pain in left knee: Secondary | ICD-10-CM | POA: Diagnosis not present

## 2020-11-14 DIAGNOSIS — M25562 Pain in left knee: Secondary | ICD-10-CM | POA: Diagnosis not present

## 2020-11-18 DIAGNOSIS — M25562 Pain in left knee: Secondary | ICD-10-CM | POA: Diagnosis not present

## 2020-11-20 DIAGNOSIS — M25562 Pain in left knee: Secondary | ICD-10-CM | POA: Diagnosis not present

## 2020-11-24 DIAGNOSIS — M25562 Pain in left knee: Secondary | ICD-10-CM | POA: Diagnosis not present

## 2020-11-26 DIAGNOSIS — M25562 Pain in left knee: Secondary | ICD-10-CM | POA: Diagnosis not present

## 2020-11-26 DIAGNOSIS — Z96652 Presence of left artificial knee joint: Secondary | ICD-10-CM | POA: Diagnosis not present

## 2020-11-26 DIAGNOSIS — Z471 Aftercare following joint replacement surgery: Secondary | ICD-10-CM | POA: Diagnosis not present

## 2020-11-26 DIAGNOSIS — M24662 Ankylosis, left knee: Secondary | ICD-10-CM | POA: Diagnosis not present

## 2020-11-29 ENCOUNTER — Other Ambulatory Visit: Payer: Self-pay | Admitting: Cardiology

## 2020-11-29 DIAGNOSIS — I25118 Atherosclerotic heart disease of native coronary artery with other forms of angina pectoris: Secondary | ICD-10-CM

## 2020-12-02 DIAGNOSIS — M25562 Pain in left knee: Secondary | ICD-10-CM | POA: Diagnosis not present

## 2020-12-04 DIAGNOSIS — M25562 Pain in left knee: Secondary | ICD-10-CM | POA: Diagnosis not present

## 2020-12-09 DIAGNOSIS — M25562 Pain in left knee: Secondary | ICD-10-CM | POA: Diagnosis not present

## 2020-12-11 DIAGNOSIS — M25562 Pain in left knee: Secondary | ICD-10-CM | POA: Diagnosis not present

## 2020-12-16 DIAGNOSIS — M25562 Pain in left knee: Secondary | ICD-10-CM | POA: Diagnosis not present

## 2020-12-30 DIAGNOSIS — M25562 Pain in left knee: Secondary | ICD-10-CM | POA: Diagnosis not present

## 2021-01-01 DIAGNOSIS — Z0000 Encounter for general adult medical examination without abnormal findings: Secondary | ICD-10-CM | POA: Diagnosis not present

## 2021-01-01 DIAGNOSIS — E785 Hyperlipidemia, unspecified: Secondary | ICD-10-CM | POA: Diagnosis not present

## 2021-01-02 DIAGNOSIS — M25562 Pain in left knee: Secondary | ICD-10-CM | POA: Diagnosis not present

## 2021-01-02 DIAGNOSIS — Z96652 Presence of left artificial knee joint: Secondary | ICD-10-CM | POA: Diagnosis not present

## 2021-01-05 DIAGNOSIS — I119 Hypertensive heart disease without heart failure: Secondary | ICD-10-CM | POA: Diagnosis not present

## 2021-01-05 DIAGNOSIS — E785 Hyperlipidemia, unspecified: Secondary | ICD-10-CM | POA: Diagnosis not present

## 2021-01-05 DIAGNOSIS — I4891 Unspecified atrial fibrillation: Secondary | ICD-10-CM | POA: Diagnosis not present

## 2021-01-05 DIAGNOSIS — Z0000 Encounter for general adult medical examination without abnormal findings: Secondary | ICD-10-CM | POA: Diagnosis not present

## 2021-01-05 DIAGNOSIS — R82998 Other abnormal findings in urine: Secondary | ICD-10-CM | POA: Diagnosis not present

## 2021-01-07 DIAGNOSIS — M25562 Pain in left knee: Secondary | ICD-10-CM | POA: Diagnosis not present

## 2021-01-13 ENCOUNTER — Telehealth: Payer: Self-pay

## 2021-01-14 ENCOUNTER — Encounter: Payer: Medicare Other | Admitting: Obstetrics and Gynecology

## 2021-01-14 DIAGNOSIS — M25562 Pain in left knee: Secondary | ICD-10-CM | POA: Diagnosis not present

## 2021-01-22 DIAGNOSIS — M25562 Pain in left knee: Secondary | ICD-10-CM | POA: Diagnosis not present

## 2021-01-23 DIAGNOSIS — Z96652 Presence of left artificial knee joint: Secondary | ICD-10-CM | POA: Diagnosis not present

## 2021-01-23 DIAGNOSIS — R531 Weakness: Secondary | ICD-10-CM | POA: Diagnosis not present

## 2021-01-23 DIAGNOSIS — M25562 Pain in left knee: Secondary | ICD-10-CM | POA: Diagnosis not present

## 2021-01-27 DIAGNOSIS — M13869 Other specified arthritis, unspecified knee: Secondary | ICD-10-CM | POA: Diagnosis not present

## 2021-01-27 DIAGNOSIS — Z13820 Encounter for screening for osteoporosis: Secondary | ICD-10-CM | POA: Diagnosis not present

## 2021-01-27 DIAGNOSIS — R531 Weakness: Secondary | ICD-10-CM | POA: Diagnosis not present

## 2021-01-27 DIAGNOSIS — M25561 Pain in right knee: Secondary | ICD-10-CM | POA: Diagnosis not present

## 2021-01-27 DIAGNOSIS — M25562 Pain in left knee: Secondary | ICD-10-CM | POA: Diagnosis not present

## 2021-01-27 DIAGNOSIS — Z96652 Presence of left artificial knee joint: Secondary | ICD-10-CM | POA: Diagnosis not present

## 2021-01-29 DIAGNOSIS — Z96651 Presence of right artificial knee joint: Secondary | ICD-10-CM | POA: Diagnosis not present

## 2021-02-04 DIAGNOSIS — Z96652 Presence of left artificial knee joint: Secondary | ICD-10-CM | POA: Diagnosis not present

## 2021-02-04 DIAGNOSIS — G8918 Other acute postprocedural pain: Secondary | ICD-10-CM | POA: Diagnosis not present

## 2021-02-04 DIAGNOSIS — M24662 Ankylosis, left knee: Secondary | ICD-10-CM | POA: Diagnosis not present

## 2021-02-05 DIAGNOSIS — Z7409 Other reduced mobility: Secondary | ICD-10-CM | POA: Diagnosis not present

## 2021-02-05 DIAGNOSIS — Z96652 Presence of left artificial knee joint: Secondary | ICD-10-CM | POA: Diagnosis not present

## 2021-02-05 DIAGNOSIS — M25662 Stiffness of left knee, not elsewhere classified: Secondary | ICD-10-CM | POA: Diagnosis not present

## 2021-02-06 DIAGNOSIS — Z96652 Presence of left artificial knee joint: Secondary | ICD-10-CM | POA: Diagnosis not present

## 2021-02-06 DIAGNOSIS — M25562 Pain in left knee: Secondary | ICD-10-CM | POA: Diagnosis not present

## 2021-02-06 DIAGNOSIS — M25662 Stiffness of left knee, not elsewhere classified: Secondary | ICD-10-CM | POA: Diagnosis not present

## 2021-02-06 DIAGNOSIS — Z7409 Other reduced mobility: Secondary | ICD-10-CM | POA: Diagnosis not present

## 2021-02-09 DIAGNOSIS — M25562 Pain in left knee: Secondary | ICD-10-CM | POA: Diagnosis not present

## 2021-02-09 DIAGNOSIS — Z7409 Other reduced mobility: Secondary | ICD-10-CM | POA: Diagnosis not present

## 2021-02-09 DIAGNOSIS — Z96652 Presence of left artificial knee joint: Secondary | ICD-10-CM | POA: Diagnosis not present

## 2021-02-09 DIAGNOSIS — M25662 Stiffness of left knee, not elsewhere classified: Secondary | ICD-10-CM | POA: Diagnosis not present

## 2021-02-11 ENCOUNTER — Other Ambulatory Visit: Payer: Self-pay

## 2021-02-11 DIAGNOSIS — Z7409 Other reduced mobility: Secondary | ICD-10-CM | POA: Diagnosis not present

## 2021-02-11 DIAGNOSIS — Z96652 Presence of left artificial knee joint: Secondary | ICD-10-CM | POA: Diagnosis not present

## 2021-02-11 DIAGNOSIS — M25562 Pain in left knee: Secondary | ICD-10-CM | POA: Diagnosis not present

## 2021-02-11 DIAGNOSIS — I10 Essential (primary) hypertension: Secondary | ICD-10-CM

## 2021-02-11 DIAGNOSIS — M25662 Stiffness of left knee, not elsewhere classified: Secondary | ICD-10-CM | POA: Diagnosis not present

## 2021-02-11 MED ORDER — LISINOPRIL 5 MG PO TABS: 5.0000 mg | ORAL_TABLET | Freq: Every day | ORAL | 3 refills | Status: DC

## 2021-02-12 NOTE — Telephone Encounter (Signed)
error 

## 2021-02-13 DIAGNOSIS — Z7409 Other reduced mobility: Secondary | ICD-10-CM | POA: Diagnosis not present

## 2021-02-13 DIAGNOSIS — Z96652 Presence of left artificial knee joint: Secondary | ICD-10-CM | POA: Diagnosis not present

## 2021-02-13 DIAGNOSIS — M25562 Pain in left knee: Secondary | ICD-10-CM | POA: Diagnosis not present

## 2021-02-13 DIAGNOSIS — M25662 Stiffness of left knee, not elsewhere classified: Secondary | ICD-10-CM | POA: Diagnosis not present

## 2021-02-15 ENCOUNTER — Other Ambulatory Visit: Payer: Self-pay | Admitting: Cardiology

## 2021-02-17 DIAGNOSIS — Z7409 Other reduced mobility: Secondary | ICD-10-CM | POA: Diagnosis not present

## 2021-02-17 DIAGNOSIS — M25662 Stiffness of left knee, not elsewhere classified: Secondary | ICD-10-CM | POA: Diagnosis not present

## 2021-02-17 DIAGNOSIS — Z96652 Presence of left artificial knee joint: Secondary | ICD-10-CM | POA: Diagnosis not present

## 2021-02-17 DIAGNOSIS — M25562 Pain in left knee: Secondary | ICD-10-CM | POA: Diagnosis not present

## 2021-02-18 DIAGNOSIS — M65332 Trigger finger, left middle finger: Secondary | ICD-10-CM | POA: Diagnosis not present

## 2021-02-19 DIAGNOSIS — Z7409 Other reduced mobility: Secondary | ICD-10-CM | POA: Diagnosis not present

## 2021-02-19 DIAGNOSIS — Z96652 Presence of left artificial knee joint: Secondary | ICD-10-CM | POA: Diagnosis not present

## 2021-02-19 DIAGNOSIS — M25562 Pain in left knee: Secondary | ICD-10-CM | POA: Diagnosis not present

## 2021-02-19 DIAGNOSIS — M25662 Stiffness of left knee, not elsewhere classified: Secondary | ICD-10-CM | POA: Diagnosis not present

## 2021-02-24 ENCOUNTER — Telehealth: Payer: Self-pay

## 2021-02-24 DIAGNOSIS — Z96652 Presence of left artificial knee joint: Secondary | ICD-10-CM | POA: Diagnosis not present

## 2021-02-24 DIAGNOSIS — M25562 Pain in left knee: Secondary | ICD-10-CM | POA: Diagnosis not present

## 2021-02-24 DIAGNOSIS — Z7409 Other reduced mobility: Secondary | ICD-10-CM | POA: Diagnosis not present

## 2021-02-24 DIAGNOSIS — M25662 Stiffness of left knee, not elsewhere classified: Secondary | ICD-10-CM | POA: Diagnosis not present

## 2021-02-24 NOTE — Telephone Encounter (Signed)
Message from the front desk scheduler:  "Patient would like to know if she still needs to come to her yearly appointment at her age. States she gets a physical with her primary every year, sees heart doctor twice a year, and still gets a mammo once a year."  I called patient back and told her our providers do recommend a yearly gyn exam for pelvic, vaginal exam and breast check if not having that performed by any other physician.  She asked to schedule appointment. Staff message sent back to Erica Frank asking her to call and schedule visit.

## 2021-02-26 DIAGNOSIS — M25562 Pain in left knee: Secondary | ICD-10-CM | POA: Diagnosis not present

## 2021-02-26 DIAGNOSIS — Z96652 Presence of left artificial knee joint: Secondary | ICD-10-CM | POA: Diagnosis not present

## 2021-02-26 DIAGNOSIS — Z7409 Other reduced mobility: Secondary | ICD-10-CM | POA: Diagnosis not present

## 2021-02-26 DIAGNOSIS — M25662 Stiffness of left knee, not elsewhere classified: Secondary | ICD-10-CM | POA: Diagnosis not present

## 2021-02-27 DIAGNOSIS — H5213 Myopia, bilateral: Secondary | ICD-10-CM | POA: Diagnosis not present

## 2021-03-08 ENCOUNTER — Other Ambulatory Visit: Payer: Self-pay | Admitting: Cardiology

## 2021-03-09 DIAGNOSIS — Z96652 Presence of left artificial knee joint: Secondary | ICD-10-CM | POA: Diagnosis not present

## 2021-03-09 DIAGNOSIS — M25662 Stiffness of left knee, not elsewhere classified: Secondary | ICD-10-CM | POA: Diagnosis not present

## 2021-03-09 DIAGNOSIS — M25562 Pain in left knee: Secondary | ICD-10-CM | POA: Diagnosis not present

## 2021-03-09 DIAGNOSIS — Z7409 Other reduced mobility: Secondary | ICD-10-CM | POA: Diagnosis not present

## 2021-03-10 ENCOUNTER — Other Ambulatory Visit: Payer: Self-pay | Admitting: Cardiology

## 2021-03-24 ENCOUNTER — Other Ambulatory Visit: Payer: Self-pay

## 2021-03-24 ENCOUNTER — Encounter: Payer: Self-pay | Admitting: Nurse Practitioner

## 2021-03-24 ENCOUNTER — Ambulatory Visit: Payer: Medicare Other | Admitting: Nurse Practitioner

## 2021-03-24 VITALS — BP 120/76 | Ht 60.0 in | Wt 137.0 lb

## 2021-03-24 DIAGNOSIS — Z01419 Encounter for gynecological examination (general) (routine) without abnormal findings: Secondary | ICD-10-CM

## 2021-03-24 DIAGNOSIS — M25662 Stiffness of left knee, not elsewhere classified: Secondary | ICD-10-CM | POA: Diagnosis not present

## 2021-03-24 DIAGNOSIS — Z96652 Presence of left artificial knee joint: Secondary | ICD-10-CM | POA: Diagnosis not present

## 2021-03-24 DIAGNOSIS — Z7409 Other reduced mobility: Secondary | ICD-10-CM | POA: Diagnosis not present

## 2021-03-24 DIAGNOSIS — M25562 Pain in left knee: Secondary | ICD-10-CM | POA: Diagnosis not present

## 2021-03-24 DIAGNOSIS — Z780 Asymptomatic menopausal state: Secondary | ICD-10-CM

## 2021-03-24 NOTE — Patient Instructions (Signed)
Health Maintenance After Age 80 After age 80, you are at a higher risk for certain long-term diseases and infections as well as injuries from falls. Falls are a major cause of broken bones and head injuries in people who are older than age 80. Getting regular preventive care can help to keep you healthy and well. Preventive care includes getting regular testing and making lifestyle changes as recommended by your health care provider. Talk with your health care provider about:  Which screenings and tests you should have. A screening is a test that checks for a disease when you have no symptoms.  A diet and exercise plan that is right for you. What should I know about screenings and tests to prevent falls? Screening and testing are the best ways to find a health problem early. Early diagnosis and treatment give you the best chance of managing medical conditions that are common after age 80. Certain conditions and lifestyle choices may make you more likely to have a fall. Your health care provider may recommend:  Regular vision checks. Poor vision and conditions such as cataracts can make you more likely to have a fall. If you wear glasses, make sure to get your prescription updated if your vision changes.  Medicine review. Work with your health care provider to regularly review all of the medicines you are taking, including over-the-counter medicines. Ask your health care provider about any side effects that may make you more likely to have a fall. Tell your health care provider if any medicines that you take make you feel dizzy or sleepy.  Osteoporosis screening. Osteoporosis is a condition that causes the bones to get weaker. This can make the bones weak and cause them to break more easily.  Blood pressure screening. Blood pressure changes and medicines to control blood pressure can make you feel dizzy.  Strength and balance checks. Your health care provider may recommend certain tests to check your  strength and balance while standing, walking, or changing positions.  Foot health exam. Foot pain and numbness, as well as not wearing proper footwear, can make you more likely to have a fall.  Depression screening. You may be more likely to have a fall if you have a fear of falling, feel emotionally low, or feel unable to do activities that you used to do.  Alcohol use screening. Using too much alcohol can affect your balance and may make you more likely to have a fall. What actions can I take to lower my risk of falls? General instructions  Talk with your health care provider about your risks for falling. Tell your health care provider if: ? You fall. Be sure to tell your health care provider about all falls, even ones that seem minor. ? You feel dizzy, sleepy, or off-balance.  Take over-the-counter and prescription medicines only as told by your health care provider. These include any supplements.  Eat a healthy diet and maintain a healthy weight. A healthy diet includes low-fat dairy products, low-fat (lean) meats, and fiber from whole grains, beans, and lots of fruits and vegetables. Home safety  Remove any tripping hazards, such as rugs, cords, and clutter.  Install safety equipment such as grab bars in bathrooms and safety rails on stairs.  Keep rooms and walkways well-lit. Activity  Follow a regular exercise program to stay fit. This will help you maintain your balance. Ask your health care provider what types of exercise are appropriate for you.  If you need a cane or walker,   use it as recommended by your health care provider.  Wear supportive shoes that have nonskid soles.   Lifestyle  Do not drink alcohol if your health care provider tells you not to drink.  If you drink alcohol, limit how much you have: ? 0-1 drink a day for women. ? 0-2 drinks a day for men.  Be aware of how much alcohol is in your drink. In the [place] one drink equals one typical bottle of beer (12  oz), one-half glass of wine (5 oz), or one shot of hard liquor (1 oz).  Do not use any products that contain nicotine or tobacco, such as cigarettes and e-cigarettes. If you need help quitting, ask your health care provider. Summary  Having a healthy lifestyle and getting preventive care can help to protect your health and wellness after age 80.  Screening and testing are the best way to find a health problem early and help you avoid having a fall. Early diagnosis and treatment give you the best chance for managing medical conditions that are more common for people who are older than age 80.  Falls are a major cause of broken bones and head injuries in people who are older than age 80. Take precautions to prevent a fall at home.  Work with your health care provider to learn what changes you can make to improve your health and wellness and to prevent falls. This information is not intended to replace advice given to you by your health care provider. Make sure you discuss any questions you have with your health care provider. Document Revised: 03/15/2019 Document Reviewed: 10/05/2017 Elsevier Patient Education  2021 Elsevier Inc.  

## 2021-03-24 NOTE — Progress Notes (Signed)
   Erica Frank [facility] 03-Jul-1941 [id number]   History:  80 y.o. G1P1 presents for breast and pelvic exam. No GYN complaints. Postmenopausal - no HRT. 1983 TAH for endometriosis. Normal pap and mammogram history. History of MI. Total knee replacement November 2021 and she has had a difficult recovery.   Gynecologic History No LMP recorded. Patient has had a hysterectomy.   Contraception: status post hysterectomy  Health Maintenance Last Pap: No longer screening per guidelines Last mammogram: 05/12/2020. Results were: normal Last colonoscopy: 2018 Last Dexa: 2022 per patient. We do not have this report  Past medical history, past surgical history, family history and social history were all reviewed and documented in the EPIC chart.  ROS:  A ROS was performed and pertinent positives and negatives are included.  Exam:  Vitals:   03/24/21 1405  BP: 120/76  Weight: 137 lb (62.1 kg)  Height: 5' (1.524 m)   Body mass index is 26.76 kg/m.  General appearance:  Normal Thyroid:  Symmetrical, normal in size, without palpable masses or nodularity. Respiratory  Auscultation:  Clear without wheezing or rhonchi Cardiovascular  Auscultation:  Regular rate, without rubs, murmurs or gallops  Edema/varicosities:  Not grossly evident Abdominal  Soft,nontender, without masses, guarding or rebound.  Liver/spleen:  No organomegaly noted  Hernia:  None appreciated  Skin  Inspection:  Grossly normal Breasts: Examined lying and sitting.   Right: Without masses, retractions, nipple discharge or axillary adenopathy.   Left: Without masses, retractions, nipple discharge or axillary adenopathy. Gentitourinary   Inguinal/mons:  Normal without inguinal adenopathy  External genitalia:  Normal appearing vulva with no masses, tenderness, or lesions  BUS/Urethra/Skene's glands:  Normal  Vagina:  Normal appearing with normal color and discharge, no lesions  Cervix:  Absent  Uterus:   Absent  Adnexa/parametria:     Rt: Normal in size, without masses or tenderness.   Lt: Normal in size, without masses or tenderness.  Anus and perineum: Normal  Digital rectal exam: Normal sphincter tone without palpated masses or tenderness  Assessment/Plan:  80 y.o. G1P1 for breast and pelvic exam.   Well female exam with routine gynecological exam - Education provided on SBEs, importance of preventative screenings, current guidelines, high calcium diet, regular exercise, and multivitamin daily. Labs with PCP.   Postmenopausal - no HRT.   Screening for cervical cancer - Normal Pap history. No longer screening per guidelines.  Screening for breast cancer - Normal mammogram history.  Continue annual screenings.  Normal breast exam today.  Screening for colon cancer - 2018 colonoscopy. Will repeat at GI's recommended interval.   Screening for osteoporosis - she thinks she had a bone density last month but has not been contacted about results. She will find out and have report sent here.  Return in 1 year for annual.      Erica Mackie DNP, 2:26 PM 03/24/2021

## 2021-04-07 DIAGNOSIS — M25562 Pain in left knee: Secondary | ICD-10-CM | POA: Diagnosis not present

## 2021-04-07 DIAGNOSIS — M25662 Stiffness of left knee, not elsewhere classified: Secondary | ICD-10-CM | POA: Diagnosis not present

## 2021-04-07 DIAGNOSIS — Z96652 Presence of left artificial knee joint: Secondary | ICD-10-CM | POA: Diagnosis not present

## 2021-04-07 DIAGNOSIS — Z7409 Other reduced mobility: Secondary | ICD-10-CM | POA: Diagnosis not present

## 2021-04-28 ENCOUNTER — Ambulatory Visit: Payer: Medicare Other

## 2021-04-28 ENCOUNTER — Other Ambulatory Visit: Payer: Self-pay

## 2021-04-28 DIAGNOSIS — Z96652 Presence of left artificial knee joint: Secondary | ICD-10-CM | POA: Diagnosis not present

## 2021-04-28 DIAGNOSIS — I6523 Occlusion and stenosis of bilateral carotid arteries: Secondary | ICD-10-CM | POA: Diagnosis not present

## 2021-04-28 DIAGNOSIS — M25562 Pain in left knee: Secondary | ICD-10-CM | POA: Diagnosis not present

## 2021-04-28 DIAGNOSIS — Z7409 Other reduced mobility: Secondary | ICD-10-CM | POA: Diagnosis not present

## 2021-04-28 DIAGNOSIS — M25662 Stiffness of left knee, not elsewhere classified: Secondary | ICD-10-CM | POA: Diagnosis not present

## 2021-05-01 ENCOUNTER — Other Ambulatory Visit: Payer: Self-pay | Admitting: Internal Medicine

## 2021-05-01 ENCOUNTER — Other Ambulatory Visit: Payer: Medicare Other

## 2021-05-01 DIAGNOSIS — M25462 Effusion, left knee: Secondary | ICD-10-CM | POA: Diagnosis not present

## 2021-05-01 DIAGNOSIS — Z1231 Encounter for screening mammogram for malignant neoplasm of breast: Secondary | ICD-10-CM

## 2021-05-01 DIAGNOSIS — Z96652 Presence of left artificial knee joint: Secondary | ICD-10-CM | POA: Diagnosis not present

## 2021-05-06 ENCOUNTER — Ambulatory Visit: Payer: Medicare Other | Admitting: Cardiology

## 2021-05-06 NOTE — Progress Notes (Signed)
Carotid artery duplex 04/28/2021: Stenosis in the right internal carotid artery (50-69%). Stenosis in the right external carotid artery (>50%). Stenosis in the left internal carotid artery (16-49%). Stenosis in the left external carotid artery (<50%). Antegrade right vertebral artery flow. Antegrade left vertebral artery flow. No significant change since 11/05/2020. Follow up in six months is appropriate if clinically indicated.

## 2021-05-06 NOTE — Progress Notes (Signed)
Primary Physician/Referring:  Prince Solian, MD  Patient ID: Erica Frank, female    DOB: 19-Sep-1941, 80 y.o.   MRN: [id number]  Chief Complaint  Patient presents with  . Follow-up  . Coronary Artery Disease  . Hypertension  . CAROTID STENOSIS    6 MONTH   HPI:    Erica Frank  is a 80 y.o. female  with hypertension, hyperlipidemia, and paroxysmal atrial fibrillation on 01/25/2019 S/P cardioversion in the ED, CAD and balloon angioplasty in 1989 and 1990, NSTEMI on 07/09/2019 S/P stenting to the mid LAD. She did not tolerate Repatha and has discontinued this and she did not refill WelChol due to cost.  She is presently on Vytorin.  Has chronic vasovagal episodes especially when she feels like she is needing to have a bowel movement, she feels dizzy and feels like she is going to pass out.  She has not had any syncope.  These episodes have been ongoing for the past year or more, was also seen in the ED on 01/16/2020 for similar episode.  She is still recuperating from the knee surgery on the left on 10/07/2020, since then she states that she just does not feel well.  She feels poorly overall.  Past Medical History:  Diagnosis Date  . Anginal pain ([place]) 1989  . Anxiety   . Arthritis   . Atherosclerosis   . Atrial fibrillation ([place])   . Coronary artery disease   . Dysrhythmia    a-fib  . GERD (gastroesophageal reflux disease)   . Heart attack ([place]) 2020  . Heart disease   . High cholesterol   . Hypertension    Past Surgical History:  Procedure Laterality Date  . ABDOMINAL HYSTERECTOMY  1983   for endometriosis with [place]  . ANGIOPLASTY    . ANGIOPLASTY    . BACK SURGERY    . CARDIAC SURGERY     Catheterization  . CORONARY STENT INTERVENTION N/A 07/09/2019   Procedure: CORONARY STENT INTERVENTION;  Surgeon: Adrian Prows, MD;  Location: [facility];  Service: Cardiovascular;  Laterality: N/A;  . LEFT HEART CATH AND CORONARY ANGIOGRAPHY N/A 07/09/2019    Procedure: LEFT HEART CATH AND CORONARY ANGIOGRAPHY and possible intervention;  Surgeon: Adrian Prows, MD;  Location: [facility];  Service: Cardiovascular;  Laterality: N/A;  4: or 4:30 PM today  . NECK SURGERY    . TOTAL KNEE ARTHROPLASTY Left 10/07/2020   Procedure: TOTAL KNEE ARTHROPLASTY;  Surgeon: Paralee Cancel, MD;  Location: [facility];  Service: Orthopedics;  Laterality: Left;  70 mins   Family History  Problem Relation Age of Onset  . Uterine cancer Mother   . Hypertension Brother   . Heart disease Brother   . Breast cancer Cousin     Social History   Tobacco Use  . Smoking status: Never Smoker  . Smokeless tobacco: Never Used  Substance Use Topics  . Alcohol use: Yes    Alcohol/week: 3.0 standard drinks    Types: 3 Glasses of wine per week    Comment: Occasional   Marital Status: Widowed  ROS  Review of Systems  Cardiovascular: Negative for dyspnea on exertion, leg swelling and syncope (near syncope).  Musculoskeletal: Positive for arthritis (bilateral knee) and joint pain.  Gastrointestinal: Negative for melena.  Neurological: Positive for focal weakness (left leg due to arthritis).  All other systems reviewed and are negative.  Objective  Blood pressure 118/62, pulse (!) 57, temperature 98.5 F (36.9 C),  temperature source Temporal, resp. rate 17, height 5' (1.524 m), weight 132 lb 6.4 oz (60.1 kg), SpO2 95 %.  Vitals with BMI 05/07/2021 03/24/2021 11/05/2020  Height _0  _1  _2   Weight 132 lbs 6 oz 137 lbs 134 lbs  BMI 25.86 73.22 02.5  Systolic 427 062 376  Diastolic 62 76 61  Pulse 57 - 54     Physical Exam Constitutional:      General: She is not in acute distress.    Appearance: She is well-developed.  Neck:     Vascular: Carotid bruit (left) present. No JVD.  Cardiovascular:     Rate and Rhythm: Normal rate and regular rhythm.     Pulses: Intact distal pulses.          Carotid pulses are on the right side with bruit.    Heart sounds: No murmur  heard. No gallop.   Pulmonary:     Effort: Pulmonary effort is normal. No accessory muscle usage.     Breath sounds: Normal breath sounds.  Abdominal:     General: Bowel sounds are normal.     Palpations: Abdomen is soft.  Musculoskeletal:        General: No swelling.  Skin:    Capillary Refill: Capillary refill takes less than 2 seconds.    Laboratory examination:   Recent Labs    06/19/20 1818 08/19/20 1047 09/18/20 1636 09/29/20 0931 10/08/20 0333  NA 136 137 139 139 136  K 4.4 3.8 5.0 4.3 4.8  CL 102 103 107 104 105  CO2 26 21* _3 GLUCOSE 106* 118* 111* 90 129*  BUN _4 CREATININE 1.00 0.81 0.94 0.95 0.74  CALCIUM 9.2 9.3 8.8* 9.0 8.8*  GFRNONAA 54* >60 58* >60 >60  GFRAA >60 >60  --   --   --    CrCl cannot be calculated (Patient's most recent lab result is older than the maximum 21 days allowed.).  CMP Latest Ref Rng & Units 10/08/2020 09/29/2020 09/18/2020  Glucose 70 - 99 mg/dL 129(H) 90 111(H)  BUN 8 - 23 mg/dL _5 Creatinine 0.44 - 1.00 mg/dL 0.74 0.95 0.94  Sodium 135 - 145 mmol/L 136 139 139  Potassium 3.5 - 5.1 mmol/L 4.8 4.3 5.0  Chloride 98 - 111 mmol/L 105 104 107  CO2 22 - 32 mmol/L _6 Calcium 8.9 - 10.3 mg/dL 8.8(L) 9.0 8.8(L)  Total Protein 6.5 - 8.1 g/dL - - 5.8(L)  Total Bilirubin 0.3 - 1.2 mg/dL - - 1.1  Alkaline Phos 38 - 126 U/L - - 47  AST 15 - 41 U/L - - 33  ALT 0 - 44 U/L - - 17   CBC Latest Ref Rng & Units 10/08/2020 09/29/2020 09/18/2020  WBC 4.0 - 10.5 K/uL 11.6(H) 7.6 10.2  Hemoglobin 12.0 - 15.0 g/dL 11.6(L) 13.4 13.3  Hematocrit 36.0 - 46.0 % 35.4(L) 40.5 40.0  Platelets 150 - 400 K/uL 239 342 214   Lipid Panel     Component Value Date/Time   CHOL 172 11/07/2020 0903   TRIG 165 (H) 11/07/2020 0903   HDL 50 11/07/2020 0903   LDLCALC 94 11/07/2020 0903   HEMOGLOBIN A1C No results found for: HGBA1C, MPG TSH No results for input(s): TSH in the last 8760 hours.  External labs:    11/03/2018:  Glucose 97, BUN/Cr 25/0.9, eGFR 60.9/73.7, Na/K 141/4.6, AST 22, ALT 19, Alk Phos 55, Bil  Tot 0.6, Apo B 63. Rest of CMP normal.  Chol 155, Trig 63, HDL 71, LDL 71. TSH Normal.   Medications and allergies   Allergies  Allergen Reactions  . Hydrocodone     Depressed  . Repatha [Evolocumab] Diarrhea    Current Outpatient Medications on File Prior to Visit  Medication Sig Dispense Refill  . acetaminophen (TYLENOL) 325 MG tablet Take 1 tablet by mouth as needed.    . ALPRAZolam (XANAX) 0.25 MG tablet Take 0.25 mg by mouth at bedtime.    Marland Kitchen BIOTIN PO Take 1 tablet by mouth at bedtime.    . Calcium Carbonate-Vit D-Min (CALCIUM 1200 PO) Take 1 tablet by mouth daily.    . Coenzyme Q10 100 MG capsule Take 100 mg by mouth daily.     . Cyanocobalamin (VITAMIN B 12 PO) Take 1 tablet by mouth daily.     Marland Kitchen ELIQUIS 5 MG TABS tablet TAKE 1 TABLET(5 MG) BY MOUTH TWICE DAILY 180 tablet 1  . ezetimibe-simvastatin (VYTORIN) 10-40 MG tablet TAKE 1 TABLET BY MOUTH DAILY 90 tablet 1  . gabapentin (NEURONTIN) 300 MG capsule Take 300 mg by mouth 2 (two) times daily.    . isosorbide mononitrate (IMDUR) 60 MG 24 hr tablet TAKE 1 TABLET(60 MG) BY MOUTH DAILY 90 tablet 3  . lisinopril (ZESTRIL) 5 MG tablet Take 1 tablet (5 mg total) by mouth daily. 90 tablet 3  . methocarbamol (ROBAXIN) 500 MG tablet Take 1 tablet (500 mg total) by mouth every 6 (six) hours as needed for muscle spasms. 40 tablet 0  . Multiple Vitamins-Minerals (MULTIVITAMIN ADULT PO) Take 1 tablet by mouth daily.     . nitroGLYCERIN (NITROSTAT) 0.4 MG SL tablet Place 1 tablet (0.4 mg total) under the tongue every 5 (five) minutes as needed for chest pain. 25 tablet 11  . traMADol (ULTRAM) 50 MG tablet Take by mouth every 6 (six) hours as needed.     No current facility-administered medications on file prior to visit.    Medications after today's encounter Current Outpatient Medications  Medication Instructions  . acetaminophen  (TYLENOL) 325 MG tablet 1 tablet, Oral, As needed  . ALPRAZolam (XANAX) 0.25 mg, Oral, Daily at bedtime  . BIOTIN PO 1 tablet, Oral, Daily at bedtime  . Calcium Carbonate-Vit D-Min (CALCIUM 1200 PO) 1 tablet, Oral, Daily  . Coenzyme Q10 100 mg, Oral, Daily  . Cyanocobalamin (VITAMIN B 12 PO) 1 tablet, Oral, Daily  . ELIQUIS 5 MG TABS tablet TAKE 1 TABLET(5 MG) BY MOUTH TWICE DAILY  . ezetimibe-simvastatin (VYTORIN) 10-40 MG tablet 1 tablet, Oral, Daily  . gabapentin (NEURONTIN) 300 mg, Oral, 2 times daily  . isosorbide mononitrate (IMDUR) 60 MG 24 hr tablet TAKE 1 TABLET(60 MG) BY MOUTH DAILY  . lisinopril (ZESTRIL) 5 mg, Oral, Daily  . methocarbamol (ROBAXIN) 500 mg, Oral, Every 6 hours PRN  . Multiple Vitamins-Minerals (MULTIVITAMIN ADULT PO) 1 tablet, Oral, Daily  . nitroGLYCERIN (NITROSTAT) 0.4 mg, Sublingual, Every 5 min PRN  . traMADol (ULTRAM) 50 MG tablet Oral, Every 6 hours PRN    Radiology:   CT Head Wo Contrast 01/16/2020: No acute intracranial abnormality noted. Diffuse mucosal thickening within the right maxillary antrum.  Cardiac Studies:   Coronary Angiography & Balloon Angioplasty of LAD in 1989 and 1990.  DCCV 01/25/2019: 120J x 1 to NSR.  Lexiscan myoview stress test 02/26/2019:  1. Lexiscan stress test was performed. Exercise capacity was not assessed. No stress symptoms reported. Resting blood  pressure was 110/70 mmHg and peak effect blood pressure was 146/60 mmHg. The resting and stress electrocardiogram demonstrated normal sinus rhythm, normal  conduction, occasional PAC, and normal repolarization.   Stress EKG is non diagnostic for ischemia as it is a pharmacologic stress.  2. The overall quality of the study is good. There is no evidence of abnormal lung activity. Stress and rest SPECT images demonstrate homogeneous tracer distribution throughout the myocardium. Gated SPECT imaging reveals normal myocardial thickening and wall motion. The left ventricular  ejection fraction was normal (52%).   3. Low risk study.  Coronary Angiography 07/09/2019: Moderate amount of diffuse coronary calcification noted in all coronary vessels including left main. Right coronary artery and ramus intermediate showed mid 30% stenosis. Mid LAD had a focal moderately calcified high-grade 95% stenosis S/P STENT SYNERGY DES 3X24. Maximum pressure: 16 atm. Inflation time: 75 sec. Stent strut is well apposed. There was residual 0% stenosis with maintenance of TIMI-3 TIMI-3 flow  Echocardiogram 04/23/2019: Normal LV systolic function with EF 59%. Left ventricle cavity is normal in size. Normal global wall motion. Calculated EF 59%. Moderate (Grade II) mitral regurgitation. E-wave dominant mitral inflow. Mild tricuspid regurgitation. No evidence of pulmonary hypertension.  Event monitor 02/01/2020 - 03/01/2020.  Baseline sample showed Sinus Rhythm w/Artifact with a heart rate of 61.5 bpm. There were 1 critical, 0 serious, and 5 stable events that occurred.  4 patient activated events were accidental push or no symptoms reported revealed normal sinus rhythm. There was a 11 beat NSVT noted at the rate of 178 bpm on 02/04/2020 at 5:41 AM.  Otherwise no heart block, no atrial fibrillation, rare PVCs noted.  Carotid artery duplex 04/28/2021: Stenosis in the right internal carotid artery (50-69%). Stenosis in the right external carotid artery (>50%). Stenosis in the left internal carotid artery (16-49%). Stenosis in the left external carotid artery (<50%). Antegrade right vertebral artery flow. Antegrade left vertebral artery flow. No significant change since 11/05/2020. Follow up in six months is appropriate if clinically indicated.   EKG   EKG 05/07/2021: Sinus bradycardia at rate of 47 bpm otherwise normal EKG.    EKG 11/05/2020: Marked sinus bradycardia at rate of 52 bpm, normal axis, incomplete right bundle branch block.  Nonspecific T abnormality.  No evidence of  ischemia, normal QT interval.   No significant change from 05/06/2020.  Assessment     ICD-10-CM   1. Coronary artery disease of native artery of native heart with stable angina pectoris ([place])  I25.118   2. Asymptomatic bilateral carotid artery stenosis  I65.23 PCV CAROTID DUPLEX (BILATERAL)  3. Paroxysmal atrial fibrillation ([place]). CHA2DS2-VASc Score is 5.  Yearly risk of stroke: 6.7% (A, F, HTN, Vasc Dz).   I48.0   4. Essential hypertension  I10 EKG 12-Lead  5. Mixed hyperlipidemia  E78.2 Lipid Panel With LDL/HDL Ratio  6. Vasovagal near syncope  R55     No orders of the defined types were placed in this encounter.   Medications Discontinued During This Encounter  Medication Reason  . celecoxib (CELEBREX) 200 MG capsule No longer needed (for PRN medications)  . polyethylene glycol (MIRALAX / GLYCOLAX) 17 g packet Error  . TURMERIC PO Error  . celecoxib (CELEBREX) 200 MG capsule No longer needed (for PRN medications)     Recommendations:   Ms. Erica Frank  is a 80 y.o. female  with hypertension, hyperlipidemia, and paroxysmal atrial fibrillation on 01/25/2019 S/P cardioversion in the ED, CAD and balloon angioplasty in 1989 and  1990, NSTEMI on 07/09/2019 S/P stenting to the mid LAD. She did not tolerate Repatha and has discontinued this and she did not refill WelChol due to cost.  She is presently on Vytorin.  Patient has vagally mediated presyncope related to bowel movements.  Fall precautions discussed with the patient. Counterpressure maneuver again discussed with the patient.   From cardiac standpoint she has not had any angina pectoris, no clinical evidence of heart failure, blood pressure is well controlled.  She continues to have significant discomfort since knee replacement on the left, advised her that her overall feeling poorly may be related to pain medications but could also be related to statins, I will give her pain medication holiday if symptoms are not improved, she  will have a statin holiday and she will contact me regarding this.  She needs lipid profile testing.  Her LDL is not at goal since she has carotid disease and also coronary artery disease however she has not been able to tolerate any of the medications including PCSK9 inhibitors.  I could consider siRNA molecule (Leqivio).  She has asymptomatic sinus bradycardia that is chronic as well.  OV in 6 months.   Adrian Prows, MD, [facility] 05/07/2021, 9:39 AM Office: [phone] Pager: [phone]

## 2021-05-07 ENCOUNTER — Ambulatory Visit: Payer: Medicare Other | Admitting: Cardiology

## 2021-05-07 ENCOUNTER — Encounter: Payer: Self-pay | Admitting: Cardiology

## 2021-05-07 ENCOUNTER — Other Ambulatory Visit: Payer: Self-pay

## 2021-05-07 VITALS — BP 118/62 | HR 57 | Temp 98.5°F | Resp 17 | Ht 60.0 in | Wt 132.4 lb

## 2021-05-07 DIAGNOSIS — I10 Essential (primary) hypertension: Secondary | ICD-10-CM | POA: Diagnosis not present

## 2021-05-07 DIAGNOSIS — I480 Paroxysmal atrial fibrillation: Secondary | ICD-10-CM

## 2021-05-07 DIAGNOSIS — E782 Mixed hyperlipidemia: Secondary | ICD-10-CM

## 2021-05-07 DIAGNOSIS — I25118 Atherosclerotic heart disease of native coronary artery with other forms of angina pectoris: Secondary | ICD-10-CM

## 2021-05-07 DIAGNOSIS — R55 Syncope and collapse: Secondary | ICD-10-CM

## 2021-05-07 DIAGNOSIS — I6523 Occlusion and stenosis of bilateral carotid arteries: Secondary | ICD-10-CM

## 2021-05-08 DIAGNOSIS — E782 Mixed hyperlipidemia: Secondary | ICD-10-CM | POA: Diagnosis not present

## 2021-05-09 LAB — LIPID PANEL WITH LDL/HDL RATIO
Cholesterol, Total: 195 mg/dL (ref 100–199)
HDL: 76 mg/dL (ref >39)
LDL Chol Calc (NIH): 106 mg/dL — ABNORMAL HIGH (ref 0–99)
LDL/HDL Ratio: 1.4 ratio (ref 0.0–3.2)
Triglycerides: 71 mg/dL (ref 0–149)
VLDL Cholesterol Cal: 13 mg/dL (ref 5–40)

## 2021-05-12 DIAGNOSIS — M79605 Pain in left leg: Secondary | ICD-10-CM | POA: Diagnosis not present

## 2021-05-12 DIAGNOSIS — Z96652 Presence of left artificial knee joint: Secondary | ICD-10-CM | POA: Diagnosis not present

## 2021-05-12 DIAGNOSIS — M160 Bilateral primary osteoarthritis of hip: Secondary | ICD-10-CM | POA: Diagnosis not present

## 2021-05-13 NOTE — Progress Notes (Signed)
I think we are doing PA for Repatha for her

## 2021-05-13 NOTE — Progress Notes (Signed)
Pt previously tried Repatha in 2021 and per chart review, pt described having non-specific side effects of loss of hair, feeling dizzy, altered taste, and GI symptoms. Past hospitalization for concerns of melena, anemia. Pt reports seeing dark stool and having diarrhea with recent Repatha dose in 01/2020. Symptoms improved following Discontinuing Repatha. Unsure if symptoms are related. Per package insert, no significance difference in diarrhea side effects compared to placebo ( 2.6% vs 3.0%). Pt open to trying Praluent if approved by insurance. Would need PA since Repetha preferred per pt's formulary. Pt not a good candidate for Leqvio due to insurance reason.

## 2021-05-13 NOTE — Progress Notes (Signed)
She should try Repatha again first

## 2021-05-15 DIAGNOSIS — M25552 Pain in left hip: Secondary | ICD-10-CM | POA: Diagnosis not present

## 2021-05-18 ENCOUNTER — Other Ambulatory Visit: Payer: Self-pay | Admitting: Pharmacist

## 2021-05-18 ENCOUNTER — Other Ambulatory Visit: Payer: Self-pay | Admitting: Cardiology

## 2021-05-18 DIAGNOSIS — E7800 Pure hypercholesterolemia, unspecified: Secondary | ICD-10-CM

## 2021-05-18 DIAGNOSIS — I25118 Atherosclerotic heart disease of native coronary artery with other forms of angina pectoris: Secondary | ICD-10-CM

## 2021-05-18 MED ORDER — REPATHA 140 MG/ML ~~LOC~~ SOSY: 140.0000 mg | PREFILLED_SYRINGE | SUBCUTANEOUS | 3 refills | Status: DC

## 2021-05-18 NOTE — Progress Notes (Signed)
ICD-10-CM   1. Coronary artery disease of native artery of native heart with stable angina pectoris (HCC)  I25.118 Evolocumab (REPATHA) 140 MG/ML SOSY    2. Pure hypercholesterolemia  E78.00 Evolocumab (REPATHA) 140 MG/ML SOSY    Lipid Profile     Meds ordered this encounter  Medications   Evolocumab (REPATHA) 140 MG/ML SOSY    Sig: Inject 140 mg into the skin every 14 (fourteen) days.    Dispense:  2 mL    Refill:  3    Orders Placed This Encounter  Procedures   Lipid Profile    Standing Status:   Future    Standing Expiration Date:   11/17/2021    Yates Decamp, MD, [facility] 05/18/2021, 9:53 PM Office: [phone] Fax: [phone] Pager: [phone]

## 2021-05-26 DIAGNOSIS — Z96652 Presence of left artificial knee joint: Secondary | ICD-10-CM | POA: Diagnosis not present

## 2021-05-28 DIAGNOSIS — Z96652 Presence of left artificial knee joint: Secondary | ICD-10-CM | POA: Diagnosis not present

## 2021-05-28 DIAGNOSIS — T8484XA Pain due to internal orthopedic prosthetic devices, implants and grafts, initial encounter: Secondary | ICD-10-CM | POA: Diagnosis not present

## 2021-05-28 DIAGNOSIS — Z09 Encounter for follow-up examination after completed treatment for conditions other than malignant neoplasm: Secondary | ICD-10-CM | POA: Diagnosis not present

## 2021-06-06 ENCOUNTER — Ambulatory Visit (HOSPITAL_COMMUNITY)
Admission: EM | Admit: 2021-06-06 | Discharge: 2021-06-06 | Disposition: A | Discharge status: Home / Self Care | Payer: Medicare Other | Attending: Family Medicine | Admitting: Family Medicine

## 2021-06-06 ENCOUNTER — Other Ambulatory Visit: Payer: Self-pay

## 2021-06-06 ENCOUNTER — Encounter (HOSPITAL_COMMUNITY): Payer: Self-pay

## 2021-06-06 DIAGNOSIS — F419 Anxiety disorder, unspecified: Secondary | ICD-10-CM | POA: Diagnosis not present

## 2021-06-06 DIAGNOSIS — G8929 Other chronic pain: Secondary | ICD-10-CM

## 2021-06-06 DIAGNOSIS — R531 Weakness: Secondary | ICD-10-CM | POA: Insufficient documentation

## 2021-06-06 DIAGNOSIS — Z96659 Presence of unspecified artificial knee joint: Secondary | ICD-10-CM | POA: Diagnosis not present

## 2021-06-06 DIAGNOSIS — M25569 Pain in unspecified knee: Secondary | ICD-10-CM | POA: Diagnosis not present

## 2021-06-06 DIAGNOSIS — R8290 Unspecified abnormal findings in urine: Secondary | ICD-10-CM

## 2021-06-06 LAB — COMPREHENSIVE METABOLIC PANEL
ALT: 23 U/L (ref 0–44)
AST: 23 U/L (ref 15–41)
Albumin: 3.6 g/dL (ref 3.5–5.0)
Alkaline Phosphatase: 67 U/L (ref 38–126)
Anion gap: 8 (ref 5–15)
BUN: 13 mg/dL (ref 8–23)
CO2: 27 mmol/L (ref 22–32)
Calcium: 9.3 mg/dL (ref 8.9–10.3)
Chloride: 101 mmol/L (ref 98–111)
Creatinine, Ser: 0.81 mg/dL (ref 0.44–1.00)
GFR, Estimated: >60 mL/min (ref >60)
Glucose, Bld: 114 mg/dL — ABNORMAL HIGH (ref 70–99)
Potassium: 4.2 mmol/L (ref 3.5–5.1)
Sodium: 136 mmol/L (ref 135–145)
Total Bilirubin: 0.6 mg/dL (ref 0.3–1.2)
Total Protein: 6.9 g/dL (ref 6.5–8.1)

## 2021-06-06 LAB — CBC WITH DIFFERENTIAL/PLATELET
Abs Immature Granulocytes: 0.02 10*3/uL (ref 0.00–0.07)
Basophils Absolute: 0 10*3/uL (ref 0.0–0.1)
Basophils Relative: 1 %
Eosinophils Absolute: 0.2 10*3/uL (ref 0.0–0.5)
Eosinophils Relative: 3 %
HCT: 43.5 % (ref 36.0–46.0)
Hemoglobin: 14.4 g/dL (ref 12.0–15.0)
Immature Granulocytes: 0 %
Lymphocytes Relative: 23 %
Lymphs Abs: 1.3 10*3/uL (ref 0.7–4.0)
MCH: 32.1 pg (ref 26.0–34.0)
MCHC: 33.1 g/dL (ref 30.0–36.0)
MCV: 96.9 fL (ref 80.0–100.0)
Monocytes Absolute: 0.8 10*3/uL (ref 0.1–1.0)
Monocytes Relative: 14 %
Neutro Abs: 3.4 10*3/uL (ref 1.7–7.7)
Neutrophils Relative %: 59 %
Platelets: 334 10*3/uL (ref 150–400)
RBC: 4.49 MIL/uL (ref 3.87–5.11)
RDW: 14.2 % (ref 11.5–15.5)
WBC: 5.8 10*3/uL (ref 4.0–10.5)
nRBC: 0 % (ref 0.0–0.2)

## 2021-06-06 LAB — POCT URINALYSIS DIPSTICK, ED / UC
Bilirubin Urine: NEGATIVE
Glucose, UA: NEGATIVE mg/dL
Hgb urine dipstick: NEGATIVE
Ketones, ur: NEGATIVE mg/dL
Nitrite: NEGATIVE
Protein, ur: NEGATIVE mg/dL
Specific Gravity, Urine: 1.015 (ref 1.005–1.030)
Urobilinogen, UA: 0.2 mg/dL (ref 0.0–1.0)
pH: 6 (ref 5.0–8.0)

## 2021-06-06 LAB — CBG MONITORING, ED: Glucose-Capillary: 108 mg/dL — ABNORMAL HIGH (ref 70–99)

## 2021-06-06 MED ORDER — CEPHALEXIN 500 MG PO CAPS: 500.0000 mg | ORAL_CAPSULE | Freq: Two times a day (BID) | ORAL | 0 refills | Status: DC

## 2021-06-06 NOTE — ED Triage Notes (Signed)
Pt reports weakness and headache x 1 weeks. States she felt she almost faint today, at her house when walking around. Denies chest pain, dizziness, nausea, vomiting

## 2021-06-06 NOTE — ED Provider Notes (Signed)
[facility]    CSN: [id number] Arrival date & time: 06/06/21  1106      History   Chief Complaint Chief Complaint  Patient presents with   Weakness    HPI Erica Frank is a 80 y.o. female.   Patient presenting today with about a week history of generalized weakness, intermittent headaches that she rates a 2 out of 10.  She said symptoms worse with vertigo and she felt for a while this morning like she was going to pass out while walking around her house.  She was able to rest and felt somewhat better after this but felt she should be checked out.  She denies chest pain, shortness of breath, abdominal pain, nausea, vomiting, diarrhea, dysuria or hematuria, dizziness, syncope, dysphagia, altered mental status.  She states she has not been herself for a year now since her left total knee replacement which has caused ongoing issue and severe chronic pain.  This has created a lot of anxiety for her and she feels shaky and anxious almost all the time now, worse the last week which she thinks is contributing to her current symptoms.  Currently not taking anything over-the-counter for her new symptoms.  Takes gabapentin and tramadol for her chronic knee pain which does take the edge off temporarily.  Past medical history significant for hypertension, hyperlipidemia, history of MI, atrial fibrillation, arthritis, anxiety.  Past Medical History:  Diagnosis Date   Anginal pain (HCC) 1989   Anxiety    Arthritis    Atherosclerosis    Atrial fibrillation (HCC)    Coronary artery disease    Dysrhythmia    a-fib   GERD (gastroesophageal reflux disease)    Heart attack (HCC) 2020   Heart disease    High cholesterol    Hypertension    Patient Active Problem List   Diagnosis Date Noted   S/P left TKA 10/07/2020   Left knee OA 10/07/2020   Syncope 01/16/2020   Anemia 01/16/2020   Thrombocytopenia (HCC) 01/16/2020   CAD (coronary artery disease) 01/16/2020   Elevated troponin  07/09/2019   NSTEMI (non-ST elevated myocardial infarction) (HCC) 07/09/2019   Paroxysmal atrial fibrillation (HCC) 02/15/2019   Mixed hyperlipidemia 02/15/2019   Bradycardia 02/15/2019   Coronary artery disease involving native coronary artery of native heart with unstable angina pectoris (HCC) 12/20/2018   Dyslipidemia 12/20/2018   Essential hypertension 12/20/2018   Past Surgical History:  Procedure Laterality Date   ABDOMINAL HYSTERECTOMY  1983   for endometriosis with Burch   ANGIOPLASTY     ANGIOPLASTY     BACK SURGERY     CARDIAC SURGERY     Catheterization   CORONARY STENT INTERVENTION N/A 07/09/2019   Procedure: CORONARY STENT INTERVENTION;  Surgeon: Yates Decamp, MD;  [facility];  Service: Cardiovascular;  Laterality: N/A;   LEFT HEART CATH AND CORONARY ANGIOGRAPHY N/A 07/09/2019   Procedure: LEFT HEART CATH AND CORONARY ANGIOGRAPHY and possible intervention;  Surgeon: Yates Decamp, MD;  Location: [facility];  Service: Cardiovascular;  Laterality: N/A;  4: or 4:30 PM today   NECK SURGERY     TOTAL KNEE ARTHROPLASTY Left 10/07/2020   Procedure: TOTAL KNEE ARTHROPLASTY;  Surgeon: Durene Romans, MD;  Location: [facility];  Service: Orthopedics;  Laterality: Left;  70 mins   OB History     Gravida  1   Para  1   Term      Preterm  AB      Living  1      SAB      IAB      Ectopic      Multiple      Live Births             Home Medications    Prior to Admission medications   Medication Sig Start Date End Date Taking? Authorizing Provider  cephALEXin (KEFLEX) 500 MG capsule Take 1 capsule (500 mg total) by mouth 2 (two) times daily. 06/06/21  Yes Particia Nearing, PA-C  acetaminophen (TYLENOL) 325 MG tablet Take 1 tablet by mouth as needed.    [provider]  ALPRAZolam Prudy Feeler) 0.25 MG tablet Take 0.25 mg by mouth at bedtime. 04/01/21   [provider]  BIOTIN PO Take 1 tablet by mouth at bedtime.    [provider]  Calcium Carbonate-Vit D-Min (CALCIUM 1200 PO) Take 1 tablet by mouth daily.    [provider]  Coenzyme Q10 100 MG capsule Take 100 mg by mouth daily.     [provider]  Cyanocobalamin (VITAMIN B 12 PO) Take 1 tablet by mouth daily.     [provider]  ELIQUIS 5 MG TABS tablet TAKE 1 TABLET(5 MG) BY MOUTH TWICE DAILY 02/16/21   Yates Decamp, MD  Evolocumab (REPATHA) 140 MG/ML SOSY Inject 140 mg into the skin every 14 (fourteen) days. 05/18/21   Yates Decamp, MD  ezetimibe-simvastatin (VYTORIN) 10-40 MG tablet TAKE 1 TABLET BY MOUTH DAILY 03/10/21   Yates Decamp, MD  gabapentin (NEURONTIN) 300 MG capsule Take 300 mg by mouth 2 (two) times daily.    [provider]  isosorbide mononitrate (IMDUR) 60 MG 24 hr tablet TAKE 1 TABLET(60 MG) BY MOUTH DAILY 03/09/21   Yates Decamp, MD  lisinopril (ZESTRIL) 5 MG tablet Take 1 tablet (5 mg total) by mouth daily. 02/11/21   Yates Decamp, MD  methocarbamol (ROBAXIN) 500 MG tablet Take 1 tablet (500 mg total) by mouth every 6 (six) hours as needed for muscle spasms. 10/07/20   Lanney Gins, PA-C  Multiple Vitamins-Minerals (MULTIVITAMIN ADULT PO) Take 1 tablet by mouth daily.     [provider]  nitroGLYCERIN (NITROSTAT) 0.4 MG SL tablet Place 1 tablet (0.4 mg total) under the tongue every 5 (five) minutes as needed for chest pain. 12/20/18   Georgeanna Lea, MD  traMADol (ULTRAM) 50 MG tablet Take by mouth every 6 (six) hours as needed.    [provider]    Family History Family History  Problem Relation Age of Onset   Uterine cancer Mother    Hypertension Brother    Heart disease Brother    Breast cancer Cousin     Social History Social History   Tobacco Use   Smoking status: Never   Smokeless tobacco: Never  Vaping Use   Vaping Use: Never used  Substance Use Topics   Alcohol use: Yes    Alcohol/week: 3.0 standard drinks    Types: 3 Glasses of wine per week    Comment: Occasional    Drug use: No     Allergies   Hydrocodone and Repatha [evolocumab]   Review of Systems Review of Systems Per HPI  Physical Exam Triage Vital Signs ED Triage Vitals  Enc Vitals Group     BP 06/06/21 1216 116/72     Pulse Rate 06/06/21 1216 66     Resp 06/06/21 1216 18  Temp 06/06/21 1216 97.9 F (36.6 C)     Temp Source 06/06/21 1216 Oral     SpO2 06/06/21 1216 92 %     Weight --      Height --      Head Circumference --      Peak Flow --      Pain Score 06/06/21 1214 2     Pain Loc --      Pain Edu? --      Excl. in GC? --    Orthostatic VS for the past 24 hrs:  BP- Lying Pulse- Lying BP- Sitting Pulse- Sitting BP- Standing at 0 minutes Pulse- Standing at 0 minutes  06/06/21 1237 147/79 52 159/78 55 126/76 62    Updated Vital Signs BP 116/72 (BP Location: Right Arm)   Pulse 66   Temp 97.9 F (36.6 C) (Oral)   Resp 18   SpO2 92%   Visual Acuity Right Eye Distance:   Left Eye Distance:   Bilateral Distance:    Right Eye Near:   Left Eye Near:    Bilateral Near:     Physical Exam Vitals and nursing note reviewed.  Constitutional:      Appearance: Normal appearance. She is not ill-appearing.  HENT:     Head: Atraumatic.     Mouth/Throat:     Mouth: Mucous membranes are moist.     Pharynx: Oropharynx is clear.  Eyes:     Extraocular Movements: Extraocular movements intact.     Conjunctiva/sclera: Conjunctivae normal.  Cardiovascular:     Rate and Rhythm: Normal rate.     Heart sounds: Normal heart sounds.  Pulmonary:     Effort: Pulmonary effort is normal. No respiratory distress.     Breath sounds: Normal breath sounds. No wheezing or rales.  Abdominal:     General: Bowel sounds are normal. There is no distension.     Palpations: Abdomen is soft.     Tenderness: There is no abdominal tenderness. There is no guarding.  Musculoskeletal:        General: No swelling or tenderness. Normal range of motion.     Cervical back: Normal range of  motion and neck supple.     Right lower leg: No edema.     Left lower leg: No edema.  Skin:    General: Skin is warm and dry.     Findings: No erythema or rash.  Neurological:     General: No focal deficit present.     Mental Status: She is alert and oriented to person, place, and time.     Cranial Nerves: No cranial nerve deficit.     Sensory: No sensory deficit.     Motor: No weakness.     Coordination: Coordination normal.     Gait: Gait normal.  Psychiatric:        Mood and Affect: Mood normal.        Thought Content: Thought content normal.        Judgment: Judgment normal.     UC Treatments / Results  Labs (all labs ordered are listed, but only abnormal results are displayed) Labs Reviewed  COMPREHENSIVE METABOLIC PANEL - Abnormal; Notable for the following components:      Result Value   Glucose, Bld 114 (*)    All other components within normal limits  CBG MONITORING, ED - Abnormal; Notable for the following components:   Glucose-Capillary 108 (*)    All other components within normal limits  POCT URINALYSIS DIPSTICK, ED / UC - Abnormal; Notable for the following components:   Leukocytes,Ua TRACE (*)    All other components within normal limits  URINE CULTURE  CBC WITH DIFFERENTIAL/PLATELET    EKG   Radiology No results found.  Procedures Procedures (including critical care time)  Medications Ordered in UC Medications - No data to display  Initial Impression / Assessment and Plan / UC Course  I have reviewed the triage vital signs and the nursing notes.  Pertinent labs & imaging results that were available during my care of the patient were reviewed by me and considered in my medical decision making (see chart for details).     Overall exam and vital signs stable and reassuring, no neurologic deficits noted on exam and EKG stable from previous with no acute abnormalities including new ST elevations.  UA today showing trace leukocytes, which may be  incidental but urine culture pending and will treat empirically while awaiting these results given her vague generalized symptoms.  Keflex sent for this until culture returns and then will modify based on results.  Discussed continuing to push fluids, bland diet as tolerated, rest.  Strict ED precautions given if symptoms worsening or not resolving.  She declines going to the emergency department at this time for further evaluation.  We will obtain basic labs for further evaluation and rule out.  She plans to call her PCP first thing Tuesday morning for a recheck.  Regarding her anxiety state, this appears to be chronic since her knee surgery last year and she will follow-up with her primary care about that.  She does do deep breathing exercises and meditation to help calm her nerves at this time which does seem to help temporarily. Final Clinical Impressions(s) / UC Diagnoses   Final diagnoses:  Weakness  Abnormal urinalysis  Chronic knee pain after total replacement of knee joint  Anxious mood   Discharge Instructions   None    ED Prescriptions     Medication Sig Dispense Auth. Provider   cephALEXin (KEFLEX) 500 MG capsule Take 1 capsule (500 mg total) by mouth 2 (two) times daily. 10 capsule Particia Nearing, [place]      PDMP not reviewed this encounter.   Particia Nearing, [place] 06/06/21 1451

## 2021-06-07 LAB — URINE CULTURE

## 2021-06-11 DIAGNOSIS — M65332 Trigger finger, left middle finger: Secondary | ICD-10-CM | POA: Diagnosis not present

## 2021-06-12 DIAGNOSIS — I119 Hypertensive heart disease without heart failure: Secondary | ICD-10-CM | POA: Diagnosis not present

## 2021-06-12 DIAGNOSIS — I4891 Unspecified atrial fibrillation: Secondary | ICD-10-CM | POA: Diagnosis not present

## 2021-06-12 DIAGNOSIS — E785 Hyperlipidemia, unspecified: Secondary | ICD-10-CM | POA: Diagnosis not present

## 2021-06-12 DIAGNOSIS — K625 Hemorrhage of anus and rectum: Secondary | ICD-10-CM | POA: Diagnosis not present

## 2021-06-18 ENCOUNTER — Encounter: Payer: Self-pay | Admitting: Orthopaedic Surgery

## 2021-06-18 ENCOUNTER — Other Ambulatory Visit: Payer: Self-pay

## 2021-06-18 ENCOUNTER — Ambulatory Visit: Payer: Medicare Other | Admitting: Orthopaedic Surgery

## 2021-06-18 DIAGNOSIS — Z96652 Presence of left artificial knee joint: Secondary | ICD-10-CM | POA: Diagnosis not present

## 2021-06-18 DIAGNOSIS — G8929 Other chronic pain: Secondary | ICD-10-CM

## 2021-06-18 DIAGNOSIS — M25562 Pain in left knee: Secondary | ICD-10-CM

## 2021-06-18 MED ORDER — METHYLPREDNISOLONE 4 MG PO TABS: ORAL_TABLET | ORAL | 0 refills | Status: DC

## 2021-06-18 NOTE — Progress Notes (Signed)
Office Visit Note   Patient: Erica Frank           Date of Birth: 1941/11/25           MRN: [id number] Visit Date: 06/18/2021              Requested by: Chilton Greathouse, MD [address] [place],  [place] [address] PCP: Chilton Greathouse, MD   Assessment & Plan: Visit Diagnoses:  1. Chronic pain of left knee   2. Hx of total knee arthroplasty, left     Plan: Her knee feels normal in terms of its ligamentous exam and good range of motion of the knee.  There was no instability on exam.  Certainly she is hurting which can be appropriate for even up to a year postoperatively.  I reassured her that her x-rays look good as well.  I would at least like to put her on a 6-day steroid taper and also have her stop using the exercise bike completely to see how her knee responds and I would like to reexamine her knee and 4 weeks from now.  At that visit I would like a repeat 2 views of the left knee to further assess how it is doing.  She agrees with this treatment plan.  All questions and concerns were answered and addressed.  We will reevaluate her in 4 weeks.  I did send in a steroid taper to her pharmacy.  Follow-Up Instructions: Return in about 4 weeks (around 07/16/2021).   Orders:  No orders of the defined types were placed in this encounter.  Meds ordered this encounter  Medications   methylPREDNISolone (MEDROL) 4 MG tablet    Sig: Medrol dose pack. Take as instructed    Dispense:  21 tablet    Refill:  0      Procedures: No procedures performed   Clinical Data: No additional findings.   Subjective: Chief Complaint  Patient presents with   Left Knee - Pain  The patient comes in today for actually a third opinion as it relates to her left total knee arthroplasty.  Her left knee was replaced by one of my colleagues in town back in November of this past year.  It has been about 8 and half months since that surgery.  She said that she is hurt the entire time.  She is  80 years old.  She does report good range of motion of the knee.  After continued pain she saw one of our other colleagues in town who at first said that he would consider surgery on her knee but then later in follow-up said he would not stating that he does not do those types of implants in terms of any type of surgery.  She has now come to me for a third opinion.  She ambulates using a walker.  She reports daily pain with her left operative total knee and is not satisfied.  She has been riding a exercise bike for 30 minutes twice daily and swimming as well.  She is a thin individual and has no significant active medical issues.  This is just been frustrating for her.  She is on Eliquis as a blood thinner so she cannot take anti-inflammatories.  She has tried a lot of different topical medications and has been through therapy as well.  She is not on any type of chronic pain medication either.  HPI  Review of Systems There is currently listed no headache, chest  pain, shortness of breath, fever, chills, nausea, vomiting  Objective: Vital Signs: There were no vitals taken for this visit.  Physical Exam She is alert and orient x3 and in no acute distress Ortho Exam Examination of her left operative knee shows a well-healed surgical incision.  There is some mild swelling that looks appropriate at 8 months postoperative.  She has excellent range of motion of that knee and is ligamentously stable.  It is not warm. Specialty Comments:  No specialty comments available.  Imaging: No results found. 2 views of the left knee that accompany her show well-seated total knee arthroplasty with no complicating features.  PMFS History: Patient Active Problem List   Diagnosis Date Noted   S/P left TKA 10/07/2020   Left knee OA 10/07/2020   Syncope 01/16/2020   Anemia 01/16/2020   Thrombocytopenia (HCC) 01/16/2020   CAD (coronary artery disease) 01/16/2020   Elevated troponin 07/09/2019   NSTEMI (non-ST  elevated myocardial infarction) (HCC) 07/09/2019   Paroxysmal atrial fibrillation (HCC) 02/15/2019   Mixed hyperlipidemia 02/15/2019   Bradycardia 02/15/2019   Coronary artery disease involving native coronary artery of native heart with unstable angina pectoris (HCC) 12/20/2018   Dyslipidemia 12/20/2018   Essential hypertension 12/20/2018   Past Medical History:  Diagnosis Date   Anginal pain (HCC) 1989   Anxiety    Arthritis    Atherosclerosis    Atrial fibrillation (HCC)    Coronary artery disease    Dysrhythmia    a-fib   GERD (gastroesophageal reflux disease)    Heart attack (HCC) 2020   Heart disease    High cholesterol    Hypertension     Family History  Problem Relation Age of Onset   Uterine cancer Mother    Hypertension Brother    Heart disease Brother    Breast cancer Cousin     Past Surgical History:  Procedure Laterality Date   ABDOMINAL HYSTERECTOMY  1983   for endometriosis with Burch   ANGIOPLASTY     ANGIOPLASTY     BACK SURGERY     CARDIAC SURGERY     Catheterization   CORONARY STENT INTERVENTION N/A 07/09/2019   Procedure: CORONARY STENT INTERVENTION;  Surgeon: Yates Decamp, MD;  [facility];  Service: Cardiovascular;  Laterality: N/A;   LEFT HEART CATH AND CORONARY ANGIOGRAPHY N/A 07/09/2019   Procedure: LEFT HEART CATH AND CORONARY ANGIOGRAPHY and possible intervention;  Surgeon: Yates Decamp, MD;  [facility];  Service: Cardiovascular;  Laterality: N/A;  4: or 4:30 PM today   NECK SURGERY     TOTAL KNEE ARTHROPLASTY Left 10/07/2020   Procedure: TOTAL KNEE ARTHROPLASTY;  Surgeon: Durene Romans, MD;  Location: [facility];  Service: Orthopedics;  Laterality: Left;  70 mins   Social History   Occupational History   Not on file  Tobacco Use   Smoking status: Never   Smokeless tobacco: Never  Vaping Use   Vaping Use: Never used  Substance and Sexual Activity   Alcohol use: Yes    Alcohol/week: 3.0 standard drinks     Types: 3 Glasses of wine per week    Comment: Occasional   Drug use: No   Sexual activity: Not Currently    Birth control/protection: Post-menopausal    Comment: 1st intercourse 79 yo-1 partners

## 2021-06-19 ENCOUNTER — Telehealth: Payer: Self-pay | Admitting: Orthopaedic Surgery

## 2021-06-19 NOTE — Telephone Encounter (Signed)
Pt would like to know if Dr. [name] could do a cortisone inj for her trigger finger? Pt would like a CB with answer.   [phone]

## 2021-06-22 ENCOUNTER — Other Ambulatory Visit: Payer: Self-pay

## 2021-06-22 MED ORDER — EZETIMIBE-SIMVASTATIN 10-40 MG PO TABS: 1.0000 | ORAL_TABLET | Freq: Every day | ORAL | 1 refills | Status: DC

## 2021-06-26 DIAGNOSIS — Z1212 Encounter for screening for malignant neoplasm of rectum: Secondary | ICD-10-CM | POA: Diagnosis not present

## 2021-06-29 ENCOUNTER — Ambulatory Visit
Admission: RE | Admit: 2021-06-29 | Discharge: 2021-06-29 | Disposition: A | Discharge status: Home / Self Care | Payer: Medicare Other | Source: Ambulatory Visit | Attending: Internal Medicine | Admitting: Internal Medicine

## 2021-06-29 ENCOUNTER — Other Ambulatory Visit: Payer: Self-pay

## 2021-06-29 DIAGNOSIS — Z1231 Encounter for screening mammogram for malignant neoplasm of breast: Secondary | ICD-10-CM

## 2021-07-10 DIAGNOSIS — M1711 Unilateral primary osteoarthritis, right knee: Secondary | ICD-10-CM | POA: Diagnosis not present

## 2021-07-16 ENCOUNTER — Ambulatory Visit: Payer: Medicare Other | Admitting: Orthopaedic Surgery

## 2021-07-16 ENCOUNTER — Other Ambulatory Visit: Payer: Self-pay

## 2021-07-16 ENCOUNTER — Ambulatory Visit: Payer: Self-pay

## 2021-07-16 ENCOUNTER — Encounter: Payer: Self-pay | Admitting: Orthopaedic Surgery

## 2021-07-16 DIAGNOSIS — Z96652 Presence of left artificial knee joint: Secondary | ICD-10-CM

## 2021-07-16 MED ORDER — PREGABALIN 50 MG PO CAPS: 50.0000 mg | ORAL_CAPSULE | Freq: Two times a day (BID) | ORAL | 1 refills | Status: DC

## 2021-07-16 NOTE — Progress Notes (Signed)
The patient is now 9 months status post a left total knee arthroplasty done by one of my colleagues in town.  She has been frustrating about how much her knee hurts.  She cannot take anti-inflammatories due to being on Eliquis.  When I saw her last I had her try a steroid taper which she said helped things calm down greatly but then the pain still has come back.  She reports a type of burning pain around the knee.  She takes tramadol which does help on occasion.  Tylenol does not help.  She has tried Neurontin and that medication has not helped at all.  Examination of her left knee shows no significant effusion.  The knee is not warm.  She has full range of motion of her knee and is ligamentously stable.  She has normal function of her foot and ankle.  She is very sensitive to touch around the knee in general but I did not sense there is a Tinel's sign over the peroneal nerve.  2 views of the left knee show well-seated knee replacement with no complicating features.  I do feel this time to switch her to a medication such as Lyrica at 50 mg twice a day at first to see if this will help with any of the burning symptoms that she is having.  I recommended a copper fit knee sleeve and even considering a ice type of machine to circulate device around her knee since this is helped.  She agrees with trying something like this.  I will see her back in 4 weeks to see how she is doing overall.  We can always even go up on Lyrica if need be but I want to start at a lower dose for now.  All questions and concerns were answered and addressed.  No x-rays are needed in 4 weeks.

## 2021-07-21 DIAGNOSIS — M65332 Trigger finger, left middle finger: Secondary | ICD-10-CM | POA: Diagnosis not present

## 2021-08-12 ENCOUNTER — Telehealth: Payer: Self-pay | Admitting: Cardiology

## 2021-08-12 NOTE — Telephone Encounter (Signed)
Pt stated she is not sure if it is the rapatha. She was put on colchicine for her gout and she believes this may be causing diarrhea. No vomiting. She will be seeing her foot doctor tomorrow to discuss this. She stated she will let us know if they cant figure out what is causing this.

## 2021-08-12 NOTE — Telephone Encounter (Signed)
Please reach out to patient to inquire further. She is [facility] patient but you may send message to me as well.

## 2021-08-12 NOTE — Telephone Encounter (Signed)
Pt called wanting to speak w/someone about her repatha medication; says she's having same symptoms she's had when she was put on it before (can't keep anything down).

## 2021-08-13 ENCOUNTER — Encounter: Payer: Self-pay | Admitting: Orthopaedic Surgery

## 2021-08-13 ENCOUNTER — Ambulatory Visit: Payer: Medicare Other | Admitting: Orthopaedic Surgery

## 2021-08-13 DIAGNOSIS — M25562 Pain in left knee: Secondary | ICD-10-CM | POA: Diagnosis not present

## 2021-08-13 DIAGNOSIS — Z96652 Presence of left artificial knee joint: Secondary | ICD-10-CM | POA: Diagnosis not present

## 2021-08-13 DIAGNOSIS — M25511 Pain in right shoulder: Secondary | ICD-10-CM

## 2021-08-13 DIAGNOSIS — M1A0720 Idiopathic chronic gout, left ankle and foot, without tophus (tophi): Secondary | ICD-10-CM

## 2021-08-13 DIAGNOSIS — G8929 Other chronic pain: Secondary | ICD-10-CM

## 2021-08-13 MED ORDER — PREDNISONE 50 MG PO TABS: ORAL_TABLET | ORAL | 0 refills | Status: DC

## 2021-08-13 MED ORDER — METHYLPREDNISOLONE ACETATE 40 MG/ML IJ SUSP
40.0000 mg | INTRAMUSCULAR | Status: AC | PRN
  Administered 2021-08-13: 40 mg via INTRA_ARTICULAR

## 2021-08-13 MED ORDER — LIDOCAINE HCL 1 % IJ SOLN
3.0000 mL | INTRAMUSCULAR | Status: AC | PRN
  Administered 2021-08-13: 3 mL

## 2021-08-13 NOTE — Progress Notes (Signed)
Office Visit Note   Patient: Erica Frank           Date of Birth: 1941-02-03           MRN: [id number] Visit Date: 08/13/2021              Requested by: Chilton Greathouse, MD [address] [place],  [place] [address] PCP: Chilton Greathouse, MD   Assessment & Plan: Visit Diagnoses:  1. Hx of total knee arthroplasty, left   2. Chronic pain of left knee   3. Acute pain of right shoulder   4. Chronic idiopathic gout involving toe of left foot without tophus     Plan: I will send in 5 days of steroid for her to see if this will help with her gout symptoms since she is not tolerating other medication such as colchicine and cannot take anti-inflammatories.  I did provide a steroid injection in the right shoulder subacromial outlet area as well which she tolerated.  From my standpoint, I do not need to see her back for 3 months unless she is having worsening symptoms with her right shoulder.  If that is the case we will get 3 views of the right shoulder and see her back sooner.  If not we will see her back in 3 months with 2 views of the left knee.  All questions and concerns were answered and addressed.  Follow-Up Instructions: Return in about 3 months (around 11/12/2021).   Orders:  Orders Placed This Encounter  Procedures   Large Joint Inj   Meds ordered this encounter  Medications   predniSONE (DELTASONE) 50 MG tablet    Sig: Take one tablet daily for 5 days.    Dispense:  5 tablet    Refill:  0       Procedures: Large Joint Inj: R subacromial bursa on 08/13/2021 3:09 PM Indications: pain and diagnostic evaluation Details: 22 G 1.5 in needle  Arthrogram: No  Medications: 3 mL lidocaine 1 %; 40 mg methylPREDNISolone acetate 40 MG/ML Outcome: tolerated well, no immediate complications Procedure, treatment alternatives, risks and benefits explained, specific risks discussed. Consent was given by the patient. Immediately prior to procedure a time out was called to verify  the correct patient, procedure, equipment, support staff and site/side marked as required. Patient was prepped and draped in the usual sterile fashion.      Clinical Data: No additional findings.   Subjective: Chief Complaint  Patient presents with   Left Knee - Follow-up  The patient was originally being seen today in follow-up for a painful left total knee arthroplasty that 1 my colleagues performed on the patient in November of last year.  She came to me for another opinion because the knee was very painful to her.  The x-rays were normal and I gave her reassurance that everything looked good and that hopefully her symptoms would continue to improve with time.  She does now report that her pain is much better.  The knee is still tight and swollen she states but she is able to go up and down stairs better as well.  She is an incredibly active 80 year old female.  She is now been dealing with an acute gout flareup of her left great toe.  She is also recently injured her right shoulder and she needed me to look at those today.  She cannot take anti-inflammatories since she is on Eliquis.  She tried colchicine but it that is caused diarrhea.  She  is not on allopurinol and she talk to me about this but I recommended she see her primary care physician about that medication since is a long-term gout maintenance medication.  I had started her on Lyrica at a low dose of 50 mg twice a day.  She says that did not really help her much.  However, she does report improvements overall.  She is an avid swimmer but she says her right shoulder is bothering her too much to swim.  She feels like it is weak and painful when reaching backwards.  HPI  Review of Systems She denies any fever, chills, nausea, vomiting  Objective: Vital Signs: There were no vitals taken for this visit.  Physical Exam She is alert and orient x3 and in no acute distress Ortho Exam Examination of her left knee shows still some mild  swelling but no redness.  Her range of motion is full and the knee is ligamentously stable.  Overall looks much better than when I first saw her few months ago.  She is tolerating pain better as well.  Her left great toe at the MTP joint does have redness and swelling but no evidence of infection.  Her right shoulder does show significant pain with external rotation.  The shoulder is well located.  There is pain with reaching behind her as well. Specialty Comments:  No specialty comments available.  Imaging: No results found.   PMFS History: Patient Active Problem List   Diagnosis Date Noted   S/P left TKA 10/07/2020   Left knee OA 10/07/2020   Syncope 01/16/2020   Anemia 01/16/2020   Thrombocytopenia (HCC) 01/16/2020   CAD (coronary artery disease) 01/16/2020   Elevated troponin 07/09/2019   NSTEMI (non-ST elevated myocardial infarction) (HCC) 07/09/2019   Paroxysmal atrial fibrillation (HCC) 02/15/2019   Mixed hyperlipidemia 02/15/2019   Bradycardia 02/15/2019   Coronary artery disease involving native coronary artery of native heart with unstable angina pectoris (HCC) 12/20/2018   Dyslipidemia 12/20/2018   Essential hypertension 12/20/2018   Past Medical History:  Diagnosis Date   Anginal pain (HCC) 1989   Anxiety    Arthritis    Atherosclerosis    Atrial fibrillation (HCC)    Coronary artery disease    Dysrhythmia    a-fib   GERD (gastroesophageal reflux disease)    Heart attack (HCC) 2020   Heart disease    High cholesterol    Hypertension     Family History  Problem Relation Age of Onset   Uterine cancer Mother    Hypertension Brother    Heart disease Brother    Breast cancer Cousin     Past Surgical History:  Procedure Laterality Date   ABDOMINAL HYSTERECTOMY  1983   for endometriosis with Burch   ANGIOPLASTY     ANGIOPLASTY     BACK SURGERY     CARDIAC SURGERY     Catheterization   CORONARY STENT INTERVENTION N/A 07/09/2019   Procedure: CORONARY STENT  INTERVENTION;  Surgeon: Yates Decamp, MD;  Location: [facility];  Service: Cardiovascular;  Laterality: N/A;   LEFT HEART CATH AND CORONARY ANGIOGRAPHY N/A 07/09/2019   Procedure: LEFT HEART CATH AND CORONARY ANGIOGRAPHY and possible intervention;  Surgeon: Yates Decamp, MD;  Location: [facility];  Service: Cardiovascular;  Laterality: N/A;  4: or 4:30 PM today   NECK SURGERY     TOTAL KNEE ARTHROPLASTY Left 10/07/2020   Procedure: TOTAL KNEE ARTHROPLASTY;  Surgeon: Durene Romans, MD;  Location:  [facility];  Service: Orthopedics;  Laterality: Left;  70 mins   Social History   Occupational History   Not on file  Tobacco Use   Smoking status: Never   Smokeless tobacco: Never  Vaping Use   Vaping Use: Never used  Substance and Sexual Activity   Alcohol use: Yes    Alcohol/week: 3.0 standard drinks    Types: 3 Glasses of wine per week    Comment: Occasional   Drug use: No   Sexual activity: Not Currently    Birth control/protection: Post-menopausal    Comment: 1st intercourse 35 yo-1 partners

## 2021-08-26 DIAGNOSIS — M79672 Pain in left foot: Secondary | ICD-10-CM | POA: Diagnosis not present

## 2021-08-26 DIAGNOSIS — M109 Gout, unspecified: Secondary | ICD-10-CM | POA: Diagnosis not present

## 2021-08-27 DIAGNOSIS — M7989 Other specified soft tissue disorders: Secondary | ICD-10-CM | POA: Insufficient documentation

## 2021-08-28 DIAGNOSIS — M7989 Other specified soft tissue disorders: Secondary | ICD-10-CM | POA: Diagnosis not present

## 2021-08-31 DIAGNOSIS — M67911 Unspecified disorder of synovium and tendon, right shoulder: Secondary | ICD-10-CM | POA: Diagnosis not present

## 2021-09-08 ENCOUNTER — Ambulatory Visit: Payer: Medicare Other | Admitting: Dermatology

## 2021-09-16 ENCOUNTER — Other Ambulatory Visit: Payer: Self-pay | Admitting: Cardiology

## 2021-09-21 ENCOUNTER — Other Ambulatory Visit: Payer: Self-pay

## 2021-09-21 DIAGNOSIS — M6289 Other specified disorders of muscle: Secondary | ICD-10-CM | POA: Diagnosis not present

## 2021-09-21 DIAGNOSIS — I25118 Atherosclerotic heart disease of native coronary artery with other forms of angina pectoris: Secondary | ICD-10-CM

## 2021-09-21 DIAGNOSIS — M7541 Impingement syndrome of right shoulder: Secondary | ICD-10-CM | POA: Diagnosis not present

## 2021-09-21 DIAGNOSIS — E7800 Pure hypercholesterolemia, unspecified: Secondary | ICD-10-CM

## 2021-09-21 MED ORDER — REPATHA 140 MG/ML ~~LOC~~ SOSY: 140.0000 mg | PREFILLED_SYRINGE | SUBCUTANEOUS | 3 refills | Status: DC

## 2021-09-25 DIAGNOSIS — M7541 Impingement syndrome of right shoulder: Secondary | ICD-10-CM | POA: Diagnosis not present

## 2021-09-25 DIAGNOSIS — M6289 Other specified disorders of muscle: Secondary | ICD-10-CM | POA: Diagnosis not present

## 2021-10-01 DIAGNOSIS — M79672 Pain in left foot: Secondary | ICD-10-CM | POA: Diagnosis not present

## 2021-10-01 DIAGNOSIS — I119 Hypertensive heart disease without heart failure: Secondary | ICD-10-CM | POA: Diagnosis not present

## 2021-10-01 DIAGNOSIS — I4891 Unspecified atrial fibrillation: Secondary | ICD-10-CM | POA: Diagnosis not present

## 2021-10-01 DIAGNOSIS — E785 Hyperlipidemia, unspecified: Secondary | ICD-10-CM | POA: Diagnosis not present

## 2021-10-06 DIAGNOSIS — Z96652 Presence of left artificial knee joint: Secondary | ICD-10-CM | POA: Diagnosis not present

## 2021-10-06 DIAGNOSIS — M6289 Other specified disorders of muscle: Secondary | ICD-10-CM | POA: Diagnosis not present

## 2021-10-06 DIAGNOSIS — M1712 Unilateral primary osteoarthritis, left knee: Secondary | ICD-10-CM | POA: Diagnosis not present

## 2021-10-06 DIAGNOSIS — R262 Difficulty in walking, not elsewhere classified: Secondary | ICD-10-CM | POA: Diagnosis not present

## 2021-10-08 DIAGNOSIS — M1712 Unilateral primary osteoarthritis, left knee: Secondary | ICD-10-CM | POA: Diagnosis not present

## 2021-10-08 DIAGNOSIS — Z96652 Presence of left artificial knee joint: Secondary | ICD-10-CM | POA: Diagnosis not present

## 2021-10-08 DIAGNOSIS — M6289 Other specified disorders of muscle: Secondary | ICD-10-CM | POA: Diagnosis not present

## 2021-10-08 DIAGNOSIS — R262 Difficulty in walking, not elsewhere classified: Secondary | ICD-10-CM | POA: Diagnosis not present

## 2021-10-12 DIAGNOSIS — M67911 Unspecified disorder of synovium and tendon, right shoulder: Secondary | ICD-10-CM | POA: Diagnosis not present

## 2021-10-13 DIAGNOSIS — M6289 Other specified disorders of muscle: Secondary | ICD-10-CM | POA: Diagnosis not present

## 2021-10-13 DIAGNOSIS — M7541 Impingement syndrome of right shoulder: Secondary | ICD-10-CM | POA: Diagnosis not present

## 2021-10-14 DIAGNOSIS — L738 Other specified follicular disorders: Secondary | ICD-10-CM | POA: Diagnosis not present

## 2021-10-14 DIAGNOSIS — D692 Other nonthrombocytopenic purpura: Secondary | ICD-10-CM | POA: Diagnosis not present

## 2021-10-14 DIAGNOSIS — L570 Actinic keratosis: Secondary | ICD-10-CM | POA: Diagnosis not present

## 2021-10-15 DIAGNOSIS — R262 Difficulty in walking, not elsewhere classified: Secondary | ICD-10-CM | POA: Diagnosis not present

## 2021-10-15 DIAGNOSIS — Z96652 Presence of left artificial knee joint: Secondary | ICD-10-CM | POA: Diagnosis not present

## 2021-10-15 DIAGNOSIS — M6289 Other specified disorders of muscle: Secondary | ICD-10-CM | POA: Diagnosis not present

## 2021-10-15 DIAGNOSIS — M1712 Unilateral primary osteoarthritis, left knee: Secondary | ICD-10-CM | POA: Diagnosis not present

## 2021-10-19 DIAGNOSIS — G589 Mononeuropathy, unspecified: Secondary | ICD-10-CM | POA: Diagnosis not present

## 2021-10-19 DIAGNOSIS — M542 Cervicalgia: Secondary | ICD-10-CM | POA: Diagnosis not present

## 2021-10-20 DIAGNOSIS — M6289 Other specified disorders of muscle: Secondary | ICD-10-CM | POA: Diagnosis not present

## 2021-10-20 DIAGNOSIS — M1712 Unilateral primary osteoarthritis, left knee: Secondary | ICD-10-CM | POA: Diagnosis not present

## 2021-10-20 DIAGNOSIS — R262 Difficulty in walking, not elsewhere classified: Secondary | ICD-10-CM | POA: Diagnosis not present

## 2021-10-20 DIAGNOSIS — Z96652 Presence of left artificial knee joint: Secondary | ICD-10-CM | POA: Diagnosis not present

## 2021-10-21 DIAGNOSIS — M5416 Radiculopathy, lumbar region: Secondary | ICD-10-CM | POA: Diagnosis not present

## 2021-10-22 DIAGNOSIS — M1712 Unilateral primary osteoarthritis, left knee: Secondary | ICD-10-CM | POA: Diagnosis not present

## 2021-10-22 DIAGNOSIS — M6289 Other specified disorders of muscle: Secondary | ICD-10-CM | POA: Diagnosis not present

## 2021-10-22 DIAGNOSIS — R262 Difficulty in walking, not elsewhere classified: Secondary | ICD-10-CM | POA: Diagnosis not present

## 2021-10-22 DIAGNOSIS — Z96652 Presence of left artificial knee joint: Secondary | ICD-10-CM | POA: Diagnosis not present

## 2021-10-27 DIAGNOSIS — M6289 Other specified disorders of muscle: Secondary | ICD-10-CM | POA: Diagnosis not present

## 2021-10-27 DIAGNOSIS — R262 Difficulty in walking, not elsewhere classified: Secondary | ICD-10-CM | POA: Diagnosis not present

## 2021-10-27 DIAGNOSIS — M1712 Unilateral primary osteoarthritis, left knee: Secondary | ICD-10-CM | POA: Diagnosis not present

## 2021-10-27 DIAGNOSIS — Z96652 Presence of left artificial knee joint: Secondary | ICD-10-CM | POA: Diagnosis not present

## 2021-11-05 ENCOUNTER — Ambulatory Visit: Payer: Medicare Other

## 2021-11-05 ENCOUNTER — Other Ambulatory Visit: Payer: Self-pay

## 2021-11-05 DIAGNOSIS — I6523 Occlusion and stenosis of bilateral carotid arteries: Secondary | ICD-10-CM

## 2021-11-05 DIAGNOSIS — M47816 Spondylosis without myelopathy or radiculopathy, lumbar region: Secondary | ICD-10-CM | POA: Diagnosis not present

## 2021-11-05 DIAGNOSIS — E7800 Pure hypercholesterolemia, unspecified: Secondary | ICD-10-CM | POA: Diagnosis not present

## 2021-11-05 DIAGNOSIS — M5416 Radiculopathy, lumbar region: Secondary | ICD-10-CM | POA: Diagnosis not present

## 2021-11-06 DIAGNOSIS — M25511 Pain in right shoulder: Secondary | ICD-10-CM | POA: Diagnosis not present

## 2021-11-06 LAB — LIPID PANEL
Chol/HDL Ratio: 1.8 ratio (ref 0.0–4.4)
Cholesterol, Total: 100 mg/dL (ref 100–199)
HDL: 57 mg/dL (ref >39)
LDL Chol Calc (NIH): 22 mg/dL (ref 0–99)
Triglycerides: 122 mg/dL (ref 0–149)
VLDL Cholesterol Cal: 21 mg/dL (ref 5–40)

## 2021-11-08 NOTE — Progress Notes (Signed)
I will discuss on OV and recheck duplex in 6 months  Carotid artery duplex 11/05/2021: Duplex suggests stenosis in the right internal carotid artery (16-49%). Duplex suggests stenosis in the left internal carotid artery (50-69%). Duplex suggests stenosis in the left external carotid artery (>50%). Antegrade left vertebral artery flow. Mild progression of the left carotid artery stenosis compared to 04/26/2021. Follow up in six months is appropriate if clinically indicated.

## 2021-11-12 ENCOUNTER — Encounter: Payer: Self-pay | Admitting: Cardiology

## 2021-11-12 ENCOUNTER — Encounter: Payer: Self-pay | Admitting: Orthopaedic Surgery

## 2021-11-12 ENCOUNTER — Ambulatory Visit: Payer: Medicare Other | Admitting: Cardiology

## 2021-11-12 ENCOUNTER — Other Ambulatory Visit: Payer: Self-pay

## 2021-11-12 ENCOUNTER — Ambulatory Visit: Payer: Medicare Other | Admitting: Orthopaedic Surgery

## 2021-11-12 ENCOUNTER — Ambulatory Visit: Payer: Self-pay

## 2021-11-12 VITALS — BP 119/74 | HR 55 | Temp 98.0°F | Resp 17 | Ht 60.0 in | Wt 134.8 lb

## 2021-11-12 DIAGNOSIS — E7800 Pure hypercholesterolemia, unspecified: Secondary | ICD-10-CM | POA: Diagnosis not present

## 2021-11-12 DIAGNOSIS — I6523 Occlusion and stenosis of bilateral carotid arteries: Secondary | ICD-10-CM

## 2021-11-12 DIAGNOSIS — I480 Paroxysmal atrial fibrillation: Secondary | ICD-10-CM

## 2021-11-12 DIAGNOSIS — Z96652 Presence of left artificial knee joint: Secondary | ICD-10-CM | POA: Diagnosis not present

## 2021-11-12 DIAGNOSIS — I10 Essential (primary) hypertension: Secondary | ICD-10-CM

## 2021-11-12 DIAGNOSIS — M25562 Pain in left knee: Secondary | ICD-10-CM

## 2021-11-12 DIAGNOSIS — I25118 Atherosclerotic heart disease of native coronary artery with other forms of angina pectoris: Secondary | ICD-10-CM

## 2021-11-12 DIAGNOSIS — G8929 Other chronic pain: Secondary | ICD-10-CM

## 2021-11-12 NOTE — Progress Notes (Signed)
3

## 2021-11-12 NOTE — Progress Notes (Signed)
The patient is now over a year out from a total knee arthroplasty of her left knee that was done by one of my colleagues in town.  She has dealt with debilitating pain since that surgery and still hurts quite a bit with her left knee.  She will be 80 years old next week.  It is still hurting her on a daily basis with mobility and without mobility.  She gets a burning type of pain as well.  Examination of her left knee still shows some warmth.  There is no redness.  Range of motion is full.  There is some play with a drawer sign of her knee but it does not feel overtly unstable and she does not describe instability symptoms.  2 views left knee show well-seated total knee arthroplasty I think the sizes look good and the space looks good I see no complicating features on the plain films.  At this point we will obtain a three-phase bone scan to rule out prosthetic loosening of her left total knee arthroplasty.  We may end up needing to recommend a surgical intervention based on her continued pain.  In 3 weeks from now when I see her back to go over the bone scan I will likely see if I can aspirate any fluid from the knee as well.  All questions and concerns were answered and addressed.

## 2021-11-12 NOTE — Patient Instructions (Addendum)
Lyrica or Gabapentin for joint pain

## 2021-11-12 NOTE — Progress Notes (Addendum)
Primary Physician/Referring:  Prince Solian, MD  Patient ID: Erica Frank, female    DOB: 01-06-1941, 80 y.o.   MRN: [id number]  Chief Complaint  Patient presents with   Coronary Artery Disease   carotid stenosis   Hyperlipidemia    6 month   HPI:    Erica Frank  is a 80 y.o. female  with hypertension, hyperlipidemia, and paroxysmal atrial fibrillation on 01/25/2019 S/P cardioversion in the ED, CAD and balloon angioplasty in [place], NSTEMI on 07/09/2019 S/P stenting to the mid LAD. She did not tolerate Repatha and has discontinued this and she did not refill WelChol due to cost.  She is presently on Vytorin.  Has chronic vasovagal episodes especially when she feels like she is needing to have a bowel movement, she feels dizzy and feels like she is going to pass out, symptoms are stable.  She has not had any syncope.   No chest pain, dyspnea. States Repatha makes her very week and tired.   Past Medical History:  Diagnosis Date   Anginal pain ([place]) 1989   Anxiety    Arthritis    Atherosclerosis    Atrial fibrillation ([place])    Coronary artery disease    Dysrhythmia    a-fib   GERD (gastroesophageal reflux disease)    Heart attack ([place]) 2020   Heart disease    High cholesterol    Hypertension    Past Surgical History:  Procedure Laterality Date   ABDOMINAL HYSTERECTOMY  1983   for endometriosis with Burch   ANGIOPLASTY     ANGIOPLASTY     BACK SURGERY     CARDIAC SURGERY     Catheterization   CORONARY STENT INTERVENTION N/A 07/09/2019   Procedure: CORONARY STENT INTERVENTION;  Surgeon: Adrian Prows, MD;  Location: [facility];  Service: Cardiovascular;  Laterality: N/A;   LEFT HEART CATH AND CORONARY ANGIOGRAPHY N/A 07/09/2019   Procedure: LEFT HEART CATH AND CORONARY ANGIOGRAPHY and possible intervention;  Surgeon: Adrian Prows, MD;  Location: [facility];  Service: Cardiovascular;  Laterality: N/A;  4: or 4:30 PM today   NECK SURGERY      TOTAL KNEE ARTHROPLASTY Left 10/07/2020   Procedure: TOTAL KNEE ARTHROPLASTY;  Surgeon: Paralee Cancel, MD;  Location: [facility];  Service: Orthopedics;  Laterality: Left;  70 mins   Family History  Problem Relation Age of Onset   Uterine cancer Mother    Hypertension Brother    Heart disease Brother    Breast cancer Cousin     Social History   Tobacco Use   Smoking status: Never   Smokeless tobacco: Never  Substance Use Topics   Alcohol use: Yes    Alcohol/week: 3.0 standard drinks    Types: 3 Glasses of wine per week    Comment: Occasional   Marital Status: Widowed  ROS  Review of Systems  Cardiovascular:  Negative for dyspnea on exertion, leg swelling and syncope (near syncope).  Musculoskeletal:  Positive for arthritis (bilateral knee) and joint pain (bilateral knee pain).  Gastrointestinal:  Negative for melena.  Neurological:  Positive for focal weakness (left leg due to arthritis).  All other systems reviewed and are negative. Objective  Blood pressure 119/74, pulse (!) 55, temperature 98 F (36.7 C), temperature source Temporal, resp. rate 17, height 5' (1.524 m), weight 134 lb 12.8 oz (61.1 kg), SpO2 93 %.  Vitals with BMI 11/12/2021 06/06/2021 05/07/2021  Height _0  - _1   Weight 134 lbs 13 oz - 132 lbs 6 oz  BMI 30.09 - 23.30  Systolic 076 226 333  Diastolic 74 72 62  Pulse 55 66 57     Physical Exam Constitutional:      General: She is not in acute distress.    Appearance: She is well-developed.  Neck:     Vascular: Carotid bruit (left) present. No JVD.  Cardiovascular:     Rate and Rhythm: Normal rate and regular rhythm.     Pulses: Intact distal pulses.          Carotid pulses are  on the right side with bruit.    Heart sounds: No murmur heard.   No gallop.  Pulmonary:     Effort: Pulmonary effort is normal. No accessory muscle usage.     Breath sounds: Normal breath sounds.  Abdominal:     General: Bowel sounds are normal.     Palpations: Abdomen is  soft.  Musculoskeletal:        General: No swelling.  Skin:    Capillary Refill: Capillary refill takes less than 2 seconds.   Laboratory examination:   Recent Labs    06/06/21 1318  NA 136  K 4.2  CL 101  CO2 27  GLUCOSE 114*  BUN 13  CREATININE 0.81  CALCIUM 9.3  GFRNONAA >60   CrCl cannot be calculated (Patient's most recent lab result is older than the maximum 21 days allowed.).  CMP Latest Ref Rng & Units 06/06/2021 10/08/2020 09/29/2020  Glucose 70 - 99 mg/dL 114(H) 129(H) 90  BUN 8 - 23 mg/dL _0 Creatinine 0.44 - 1.00 mg/dL 0.81 0.74 0.95  Sodium 135 - 145 mmol/L 136 136 139  Potassium 3.5 - 5.1 mmol/L 4.2 4.8 4.3  Chloride 98 - 111 mmol/L 101 105 104  CO2 22 - 32 mmol/L _1 Calcium 8.9 - 10.3 mg/dL 9.3 8.8(L) 9.0  Total Protein 6.5 - 8.1 g/dL 6.9 - -  Total Bilirubin 0.3 - 1.2 mg/dL 0.6 - -  Alkaline Phos 38 - 126 U/L 67 - -  AST 15 - 41 U/L 23 - -  ALT 0 - 44 U/L 23 - -   CBC Latest Ref Rng & Units 06/06/2021 10/08/2020 09/29/2020  WBC 4.0 - 10.5 K/uL 5.8 11.6(H) 7.6  Hemoglobin 12.0 - 15.0 g/dL 14.4 11.6(L) 13.4  Hematocrit 36.0 - 46.0 % 43.5 35.4(L) 40.5  Platelets 150 - 400 K/uL 334 239 342   Lipid Panel     Component Value Date/Time   CHOL 100 11/05/2021 0812   TRIG 122 11/05/2021 0812   HDL 57 11/05/2021 0812   CHOLHDL 1.8 11/05/2021 0812   LDLCALC 22 11/05/2021 0812   HEMOGLOBIN A1C No results found for: HGBA1C, MPG TSH No results for input(s): TSH in the last 8760 hours.  External labs:   11/03/2018:  Glucose 97, BUN/Cr 25/0.9, eGFR 60.9/73.7, Na/K 141/4.6, AST 22, ALT 19, Alk Phos 55, Bil Tot 0.6, Apo B 63. Rest of CMP normal.  Chol 155, Trig 63, HDL 71, LDL 71. TSH Normal.   Medications and allergies   Allergies  Allergen Reactions   Hydrocodone     Depressed   Repatha [Evolocumab] Diarrhea    Current Outpatient Medications on File Prior to Visit  Medication Sig Dispense Refill   acetaminophen (TYLENOL) 325 MG tablet Take  1 tablet by mouth as needed.     ALPRAZolam (XANAX) 0.25 MG tablet Take 0.25 mg  by mouth at bedtime.     BIOTIN PO Take 1 tablet by mouth at bedtime.     Calcium Carbonate-Vit D-Min (CALCIUM 1200 PO) Take 1 tablet by mouth daily.     Coenzyme Q10 100 MG capsule Take 100 mg by mouth daily.      Cyanocobalamin (VITAMIN B 12 PO) Take 1 tablet by mouth daily.      ELIQUIS 5 MG TABS tablet TAKE 1 TABLET(5 MG) BY MOUTH TWICE DAILY 180 tablet 1   Evolocumab (REPATHA) 140 MG/ML SOSY Inject 140 mg into the skin every 14 (fourteen) days. 6 mL 3   ezetimibe-simvastatin (VYTORIN) 10-40 MG tablet Take 1 tablet by mouth daily. 90 tablet 1   isosorbide mononitrate (IMDUR) 60 MG 24 hr tablet TAKE 1 TABLET(60 MG) BY MOUTH DAILY 90 tablet 3   lisinopril (ZESTRIL) 5 MG tablet Take 1 tablet (5 mg total) by mouth daily. 90 tablet 3   methocarbamol (ROBAXIN) 500 MG tablet Take 1 tablet (500 mg total) by mouth every 6 (six) hours as needed for muscle spasms. 40 tablet 0   Multiple Vitamins-Minerals (MULTIVITAMIN ADULT PO) Take 1 tablet by mouth daily.      nitroGLYCERIN (NITROSTAT) 0.4 MG SL tablet Place 1 tablet (0.4 mg total) under the tongue every 5 (five) minutes as needed for chest pain. 25 tablet 11   saccharomyces boulardii (FLORASTOR) 250 MG capsule Take 1 capsule by mouth as needed.     traMADol (ULTRAM) 50 MG tablet Take by mouth every 6 (six) hours as needed.     Turmeric 500 MG CAPS Take 1 capsule by mouth See admin instructions.     No current facility-administered medications on file prior to visit.    Medications after today's encounter Current Outpatient Medications  Medication Instructions   acetaminophen (TYLENOL) 325 MG tablet 1 tablet, Oral, As needed   ALPRAZolam (XANAX) 0.25 mg, Oral, Daily at bedtime   BIOTIN PO 1 tablet, Oral, Daily at bedtime   Calcium Carbonate-Vit D-Min (CALCIUM 1200 PO) 1 tablet, Oral, Daily   Coenzyme Q10 100 mg, Oral, Daily   Cyanocobalamin (VITAMIN B 12 PO) 1  tablet, Oral, Daily   ELIQUIS 5 MG TABS tablet TAKE 1 TABLET(5 MG) BY MOUTH TWICE DAILY   ezetimibe-simvastatin (VYTORIN) 10-40 MG tablet 1 tablet, Oral, Daily   isosorbide mononitrate (IMDUR) 60 MG 24 hr tablet TAKE 1 TABLET(60 MG) BY MOUTH DAILY   lisinopril (ZESTRIL) 5 mg, Oral, Daily   methocarbamol (ROBAXIN) 500 mg, Oral, Every 6 hours PRN   Multiple Vitamins-Minerals (MULTIVITAMIN ADULT PO) 1 tablet, Oral, Daily   nitroGLYCERIN (NITROSTAT) 0.4 mg, Sublingual, Every 5 min PRN   Repatha 140 mg, Subcutaneous, Every 14 days   saccharomyces boulardii (FLORASTOR) 250 MG capsule 1 capsule, Oral, As needed   traMADol (ULTRAM) 50 MG tablet Oral, Every 6 hours PRN   Turmeric 500 MG CAPS 1 capsule, Oral, See admin instructions    Radiology:   CT Head Wo Contrast 01/16/2020: No acute intracranial abnormality noted. Diffuse mucosal thickening within the right maxillary antrum.  Cardiac Studies:   Coronary Angiography & Balloon Angioplasty of LAD in 1989 and 1990.   DCCV 01/25/2019: 120J x 1 to NSR.  Lexiscan myoview stress test 02/26/2019:  1. Lexiscan stress test was performed. Exercise capacity was not assessed. No stress symptoms reported. Resting blood pressure was 110/70 mmHg and peak effect blood pressure was 146/60 mmHg. The resting and stress electrocardiogram demonstrated normal sinus rhythm, normal  conduction, occasional PAC,  and normal repolarization.   Stress EKG is non diagnostic for ischemia as it is a pharmacologic stress.  2. The overall quality of the study is good. There is no evidence of abnormal lung activity. Stress and rest SPECT images demonstrate homogeneous tracer distribution throughout the myocardium. Gated SPECT imaging reveals normal myocardial thickening and wall motion. The left ventricular ejection fraction was normal (52%).   3. Low risk study.  Coronary Angiography 07/09/2019: Moderate amount of diffuse coronary calcification noted in all coronary vessels  including left main.  Right coronary artery and ramus intermediate showed mid 30% stenosis.  Mid LAD had a focal moderately calcified high-grade 95% stenosis S/P  STENT SYNERGY DES 3X24. Maximum pressure: 16 atm. Inflation time: 75 sec. Stent strut is well apposed.  There was residual 0% stenosis with maintenance of TIMI-3 TIMI-3 flow  Echocardiogram 04/23/2019: Normal LV systolic function with EF 59%. Left ventricle cavity is normal in size. Normal global wall motion. Calculated EF 59%. Moderate (Grade II) mitral regurgitation. E-wave dominant mitral inflow. Mild tricuspid regurgitation. No evidence of pulmonary hypertension.  Event monitor 02/01/2020 - 03/01/2020.  Baseline sample showed Sinus Rhythm w/Artifact with a heart rate of 61.5 bpm. There were 1 critical, 0 serious, and 5 stable events that occurred.  4 patient activated events were accidental push or no symptoms reported revealed normal sinus rhythm. There was a 11 beat NSVT noted at the rate of 178 bpm on 02/04/2020 at 5:41 AM.  Otherwise no heart block, no atrial fibrillation, rare PVCs noted.  Carotid artery duplex 11/05/2021: Duplex suggests stenosis in the right internal carotid artery (16-49%). Duplex suggests stenosis in the left internal carotid artery (50-69%). Duplex suggests stenosis in the left external carotid artery (>50%). Antegrade left vertebral artery flow. Mild progression of the left carotid artery stenosis compared to 04/26/2021. Follow up in six months is appropriate if clinically indicated.  EKG  EKG 11/12/2021: Marked sinus bradycardia at the rate of 55 bpm, leftward axis.  No evidence of ischemia.Marland Kitchen  No change from 05/07/2021, previously heart rate was 47 bpm.    Assessment     ICD-10-CM   1. Essential hypertension  I10 EKG 12-Lead    2. Paroxysmal atrial fibrillation (HCC)  I48.0 EKG 12-Lead    3. Asymptomatic bilateral carotid artery stenosis  I65.23 PCV CAROTID DUPLEX (BILATERAL)    4. Coronary  artery disease of native artery of native heart with stable angina pectoris ([facility])  I25.118     5. Pure hypercholesterolemia  E78.00       No orders of the defined types were placed in this encounter.   Medications Discontinued During This Encounter  Medication Reason   cephALEXin (KEFLEX) 500 MG capsule    gabapentin (NEURONTIN) 300 MG capsule    methylPREDNISolone (MEDROL) 4 MG tablet    predniSONE (DELTASONE) 50 MG tablet    pregabalin (LYRICA) 50 MG capsule     Recommendations:   Ms. Anselma Herbel  is a 80 y.o. female  with hypertension, hyperlipidemia, asymptomatic chronic sinus bradycardia, and paroxysmal atrial fibrillation on 01/25/2019 S/P cardioversion in the ED, CAD and balloon angioplasty in [place], NSTEMI on 07/09/2019 S/P stenting to the mid LAD. She did not tolerate Repatha, however has continued to taking this as encysted due to underlying coronary disease and coronary artery disease.  She develops marked generalized fatigue and weakness and also arthralgias with Repatha.  She is presently on Vytorin.  I could consider siRNA molecule (Leqivio).  Patient has  vagally mediated presyncope related to bowel movements.  This is remained stable.     From cardiac standpoint she has not had any angina pectoris, no clinical evidence of heart failure, blood pressure is well controlled.  She continues to have significant discomfort since knee replacement on the left.   Carotid artery duplex shows minimal progression in left carotid disease, will continue 6 monthly surveillance.  OV in 6 months.   Adrian Prows, MD, [facility] 11/12/2021, 10:10 AM Office: [phone] Pager: [phone]

## 2021-11-13 ENCOUNTER — Telehealth: Payer: Self-pay | Admitting: Orthopaedic Surgery

## 2021-11-13 NOTE — Telephone Encounter (Signed)
Pt coming back in 3 weeks for a follow up with a bone scan result to be read. Per Morrie Sheldon pt can see either Dr. [name] or Bronson Curb. Pt made an appointment for both as Morrie Sheldon was in clinic and had to wait on an answer back if pt could see Bronson Curb for the reading. Pt will call back after sch'ing bone scan this week to decide if she would like to see Bronson Curb in 3 weeks on the 29th when she was originally to have the follow up or wait until [name] is back the following week on the 5th.

## 2021-11-19 DIAGNOSIS — M5416 Radiculopathy, lumbar region: Secondary | ICD-10-CM | POA: Diagnosis not present

## 2021-11-19 DIAGNOSIS — Z6825 Body mass index (BMI) 25.0-25.9, adult: Secondary | ICD-10-CM | POA: Diagnosis not present

## 2021-11-25 ENCOUNTER — Ambulatory Visit (HOSPITAL_COMMUNITY)
Admission: RE | Admit: 2021-11-25 | Discharge: 2021-11-25 | Disposition: A | Discharge status: Home / Self Care | Payer: Medicare Other | Source: Ambulatory Visit | Attending: Orthopaedic Surgery | Admitting: Orthopaedic Surgery

## 2021-11-25 ENCOUNTER — Other Ambulatory Visit: Payer: Self-pay

## 2021-11-25 DIAGNOSIS — Z96652 Presence of left artificial knee joint: Secondary | ICD-10-CM | POA: Diagnosis not present

## 2021-11-25 DIAGNOSIS — M25562 Pain in left knee: Secondary | ICD-10-CM | POA: Insufficient documentation

## 2021-11-25 DIAGNOSIS — G8929 Other chronic pain: Secondary | ICD-10-CM | POA: Insufficient documentation

## 2021-11-25 MED ORDER — TECHNETIUM TC 99M MEDRONATE IV KIT
20.0000 | PACK | Freq: Once | INTRAVENOUS | Status: AC | PRN
  Administered 2021-11-25: 10:00:00 21.5 via INTRAVENOUS

## 2021-12-03 ENCOUNTER — Ambulatory Visit: Payer: Medicare Other | Admitting: Physician Assistant

## 2021-12-03 ENCOUNTER — Telehealth: Payer: Self-pay | Admitting: Cardiology

## 2021-12-03 DIAGNOSIS — I480 Paroxysmal atrial fibrillation: Secondary | ICD-10-CM

## 2021-12-03 DIAGNOSIS — I25118 Atherosclerotic heart disease of native coronary artery with other forms of angina pectoris: Secondary | ICD-10-CM

## 2021-12-03 MED ORDER — ASPIRIN 81 MG PO CHEW: 81.0000 mg | CHEWABLE_TABLET | Freq: Every day | ORAL | Status: DC

## 2021-12-03 NOTE — Telephone Encounter (Signed)
pt is requesting you give her a call regarding a new medication and insurance coverage along with some other questions she has about going off eliquis. Her call back number is [phone].  She changed her insurance from [employer] now with [place] for [employer].  She also has questions regarding discontinuation of Eliquis.  I discussed with her regarding changing [employer] and [employer] to [place] and we will look into coverage for [employer].  Also discussed with her that she had 1 episode of atrial fibrillation on 01/25/2019 and was cardioverted in the emergency room with no recurrence.  She would like to come off of anticoagulation.  I have discussed with her regarding the risk of stopping anticoagulation as her cardioembolic risk is fairly high. CHA2DS2-VASc Score is 5.  Yearly risk of stroke: 7.2% (A, F, HTN, Vasc Dz).  Score of 1=0.6; 2=2.2; 3=3.2; 4=4.8; 5=7.2; 6=9.8; 7=>9.8) -(CHF; HTN; vasc disease DM,  Female = 1; Age <65 =0; 65-74 = 1,  >75 =2; stroke/embolism= 2).    However patient states that she has not had any further episodes of atrial fibrillation, she would like to discontinue this.  Shared decision made to discontinue Eliquis for now.  I spent a total of 12 minutes on telephone including review of chart and documentation.    ICD-10-CM   1. Coronary artery disease of native artery of native heart with stable angina pectoris (HCC)  I25.118 aspirin (ASPIRIN CHILDRENS) 81 MG chewable tablet    2. Paroxysmal atrial fibrillation (HCC)  I48.0      Meds ordered this encounter  Medications   aspirin (ASPIRIN CHILDRENS) 81 MG chewable tablet    Sig: Chew 1 tablet (81 mg total) by mouth daily.    Medications Discontinued During This Encounter  Medication Reason   ELIQUIS 5 MG TABS tablet Patient Preference     Yates Decamp, MD, [facility] 12/03/2021, 5:04 PM Office: [phone] Fax: [phone] Pager: [phone]

## 2021-12-09 ENCOUNTER — Ambulatory Visit: Payer: Medicare Other | Admitting: Orthopaedic Surgery

## 2021-12-09 ENCOUNTER — Encounter: Payer: Self-pay | Admitting: Orthopaedic Surgery

## 2021-12-09 DIAGNOSIS — T8484XD Pain due to internal orthopedic prosthetic devices, implants and grafts, subsequent encounter: Secondary | ICD-10-CM

## 2021-12-09 DIAGNOSIS — Z96652 Presence of left artificial knee joint: Secondary | ICD-10-CM | POA: Diagnosis not present

## 2021-12-09 MED ORDER — LIDOCAINE HCL 1 % IJ SOLN
3.0000 mL | INTRAMUSCULAR | Status: AC | PRN
  Administered 2021-12-09: 3 mL

## 2021-12-09 MED ORDER — METHYLPREDNISOLONE ACETATE 40 MG/ML IJ SUSP
40.0000 mg | INTRAMUSCULAR | Status: AC | PRN
  Administered 2021-12-09: 40 mg via INTRA_ARTICULAR

## 2021-12-09 NOTE — Progress Notes (Signed)
The patient comes in today after having a three-phase bone scan of her left knee.  She has a painful knee arthroplasty done by 1 my colleagues in town in November 2021.  She is 81 years old and very active.  She continues to have pain with her left knee that does wake her up at night.  She exercises quite a bit.  She is staying active.  Her knee pain continues to detrimentally affect her mobility and her quality of life.  She is very frustrated.  I am actually the fourth orthopedic surgeon she is seen for this knee.  The left knee is reexamined.  He has excellent range of motion.  There is some slight play with varus and valgus stressing but is only slight.  There is no significant effusion at all in the knee and is not hot.  It is not red.  Her incisions healed nicely.  Plain films continues show well-seated total knee arthroplasty with no complicating features.  Three-phase bone scan does not show any evidence of loosening of the components.  There is some uptake in the knee joint itself suggesting a synovitis.  However, again on exam today there is no effusion at all and no evidence of synovitis.  She has been recently on prednisone for her spine.  She says that has helped her knee feel better somewhat.  I would like to try one-time intra-articular steroid injection to see how this helps her symptoms.  She agreed to this and tolerated well.  I explained the risk and benefits of steroid injections.  I then placed a steroid in the left knee joint without difficulty.  I would like to reevaluate her in 2 weeks.  At this point the only option would be an open arthrotomy and assessment of the components with a polyliner exchange versus complete revision.  We can discuss that again in 2 weeks.  Procedure Note  Patient: Erica Frank             Date of Birth: 1941/02/27           MRN: [id number]             Visit Date: 12/09/2021  Procedures: Visit Diagnoses:  1. Hx of total knee arthroplasty, left    2. Pain due to total left knee replacement, subsequent encounter     Large Joint Inj: L knee on 12/09/2021 9:08 AM Indications: diagnostic evaluation and pain Details: 22 G 1.5 in needle, superolateral approach  Arthrogram: No  Medications: 3 mL lidocaine 1 %; 40 mg methylPREDNISolone acetate 40 MG/ML Outcome: tolerated well, no immediate complications Procedure, treatment alternatives, risks and benefits explained, specific risks discussed. Consent was given by the patient. Immediately prior to procedure a time out was called to verify the correct patient, procedure, equipment, support staff and site/side marked as required. Patient was prepped and draped in the usual sterile fashion.

## 2021-12-10 ENCOUNTER — Telehealth: Payer: Self-pay | Admitting: Orthopaedic Surgery

## 2021-12-10 ENCOUNTER — Ambulatory Visit: Payer: Medicare Other | Admitting: Orthopaedic Surgery

## 2021-12-10 NOTE — Telephone Encounter (Signed)
Patient is requesting knee xrays from 07/16/21 & 11/12/21 on a CD. Please call when ready [phone]

## 2021-12-10 NOTE — Telephone Encounter (Signed)
Patient aware CD is ready for pickup at front desk.  

## 2021-12-21 DIAGNOSIS — M7070 Other bursitis of hip, unspecified hip: Secondary | ICD-10-CM | POA: Insufficient documentation

## 2021-12-21 DIAGNOSIS — M25552 Pain in left hip: Secondary | ICD-10-CM | POA: Insufficient documentation

## 2021-12-22 ENCOUNTER — Encounter: Payer: Self-pay | Admitting: Dermatology

## 2021-12-22 ENCOUNTER — Ambulatory Visit: Payer: Medicare Other | Admitting: Dermatology

## 2021-12-23 ENCOUNTER — Ambulatory Visit: Payer: Medicare Other | Admitting: Orthopaedic Surgery

## 2021-12-23 ENCOUNTER — Encounter: Payer: Self-pay | Admitting: Orthopaedic Surgery

## 2021-12-23 ENCOUNTER — Other Ambulatory Visit: Payer: Self-pay

## 2021-12-23 DIAGNOSIS — Z96652 Presence of left artificial knee joint: Secondary | ICD-10-CM | POA: Diagnosis not present

## 2021-12-23 DIAGNOSIS — T8484XD Pain due to internal orthopedic prosthetic devices, implants and grafts, subsequent encounter: Secondary | ICD-10-CM | POA: Diagnosis not present

## 2021-12-23 DIAGNOSIS — G8929 Other chronic pain: Secondary | ICD-10-CM | POA: Diagnosis not present

## 2021-12-23 DIAGNOSIS — M25562 Pain in left knee: Secondary | ICD-10-CM

## 2021-12-23 NOTE — Progress Notes (Signed)
The patient is a very pleasant 81 year old female I been following for painful left total knee arthroplasty that was done over a year ago by one of my colleagues in town.  She is still very frustrated and the fact that her knee is hurting her.  She has seen several second opinions as a relates to her left knee.  It still hurts her on a daily basis.  Her plain film showed no evidence of complicating features of the left knee and a three-phase bone scan showed only some mild uptake suggesting some synovitis.  She is a patient of Dr. Danielle Dess and he did see her yesterday and placed 2 different injections in her back hopefully helping the left side.  She did go to [facility] who was the original operating surgeons on her left knee and saw Dr. Darrelyn Hillock this past weekend.  She said he took x-rays of her hips and saw a lot of calcium deposits the weight was described to her.  She said that she would get those x-rays for me.  She is still hesitant to proceed with any type of surgery on her left knee.  My recommendation would be at this point a synovectomy and a polyliner exchange with assessing the components.  She said she may just deal with this without any type of surgery.  On exam today her knee feels ligamentously stable.  There is just a touch of play in the knee on exam but no redness and no effusion.  The range of motion is full.  I will see her back in 4 weeks to discuss things further with her and to go over any x-rays that she brings of her pelvis and left hip.

## 2021-12-27 ENCOUNTER — Other Ambulatory Visit: Payer: Self-pay | Admitting: Cardiology

## 2022-01-05 ENCOUNTER — Other Ambulatory Visit: Payer: Self-pay | Admitting: Pharmacist

## 2022-01-05 DIAGNOSIS — I25118 Atherosclerotic heart disease of native coronary artery with other forms of angina pectoris: Secondary | ICD-10-CM

## 2022-01-05 DIAGNOSIS — E782 Mixed hyperlipidemia: Secondary | ICD-10-CM

## 2022-01-05 DIAGNOSIS — E7800 Pure hypercholesterolemia, unspecified: Secondary | ICD-10-CM

## 2022-01-05 MED ORDER — PRALUENT 75 MG/ML ~~LOC~~ SOAJ: 75.0000 mg | SUBCUTANEOUS | 6 refills | Status: DC

## 2022-01-05 NOTE — Progress Notes (Signed)
Agree with trying to change PCSK9 I. Prior approval process done

## 2022-01-06 ENCOUNTER — Encounter: Payer: Self-pay | Admitting: Dermatology

## 2022-01-12 ENCOUNTER — Ambulatory Visit: Payer: Medicare Other | Admitting: Nurse Practitioner

## 2022-01-12 ENCOUNTER — Telehealth: Payer: Self-pay

## 2022-01-12 ENCOUNTER — Telehealth: Payer: Self-pay | Admitting: Orthopaedic Surgery

## 2022-01-12 ENCOUNTER — Encounter: Payer: Self-pay | Admitting: Nurse Practitioner

## 2022-01-12 ENCOUNTER — Other Ambulatory Visit: Payer: Self-pay

## 2022-01-12 VITALS — BP 110/70

## 2022-01-12 DIAGNOSIS — N952 Postmenopausal atrophic vaginitis: Secondary | ICD-10-CM

## 2022-01-12 DIAGNOSIS — N3946 Mixed incontinence: Secondary | ICD-10-CM | POA: Diagnosis not present

## 2022-01-12 DIAGNOSIS — Z96652 Presence of left artificial knee joint: Secondary | ICD-10-CM

## 2022-01-12 NOTE — Telephone Encounter (Signed)
Yes it is her left knee

## 2022-01-12 NOTE — Addendum Note (Signed)
Addended by: Barbette Or on: 01/12/2022 03:55 PM   Modules accepted: Orders

## 2022-01-12 NOTE — Telephone Encounter (Signed)
Lvm for pt to cb to discuss  

## 2022-01-12 NOTE — Progress Notes (Signed)
° °  Acute Office Visit  Subjective:    Patient ID: Erica Frank, female    DOB: 10-22-1941, 81 y.o.   MRN: [id number]   HPI 81 y.o. presents today for urinary incontinence. This is not new for her and she has had issues with incontinence since having her child. Burch procedure in 1983 with minimal improvement. She experiences dribbling with coughing/sneezing, but her most bothersome symptom is nighttime urination and inability to get to the bathroom in time. She urinates 2-3 times per night. She was prescribed a medication in the past for urge incontinence with no improvement. She is unsure of which one. She does complain of intermittent vulvar burning.    Review of Systems  Constitutional: Negative.   Genitourinary:  Positive for urgency and vaginal pain (Intermittent vulvar burning). Negative for difficulty urinating, dysuria, flank pain, frequency and hematuria.       Incontinence      Objective:    Physical Exam Constitutional:      Appearance: Normal appearance.  Genitourinary:    General: Normal vulva.     Uterus: Absent.      Comments: Atrophic changes. No evidence of bladder prolapse. Does have pelvic floor weakness. Slight flicker at introitus with squeeze.    BP 110/70  Wt Readings from Last 3 Encounters:  11/12/21 134 lb 12.8 oz (61.1 kg)  05/07/21 132 lb 6.4 oz (60.1 kg)  03/24/21 137 lb (62.1 kg)        Assessment & Plan:   Problem List Items Addressed This Visit   None Visit Diagnoses     Mixed incontinence urge and stress    -  Primary   Postmenopausal atrophic vaginitis          Plan: Discussed stress and urge incontinence and management options for both. She will work on double voiding, voiding more often, avoiding fluids 2-3 hours before bed, and avoiding triggering foods/beverages. For urge incontinence we discussed pelvic floor PT, medication management, and/or Urogynecology referral. We also discussed option for vaginal estrogen. She would like  Urogyn referral.      Tamela Gammon DNP, 11:02 AM 01/12/2022

## 2022-01-12 NOTE — Telephone Encounter (Signed)
Please advise 

## 2022-01-12 NOTE — Telephone Encounter (Signed)
MRI ordered

## 2022-01-12 NOTE — Telephone Encounter (Signed)
Patient called. Says her knee is worst. Would like to know if she could have a MRI done. Her call back number is [phone]

## 2022-01-12 NOTE — Telephone Encounter (Signed)
[name], NP  [facility] Triage Please send referral to Urogynecology for mixed incontinence.   Referral sent.

## 2022-01-15 ENCOUNTER — Telehealth: Payer: Self-pay | Admitting: Orthopaedic Surgery

## 2022-01-15 NOTE — Telephone Encounter (Signed)
Received call from pt, she dropped off couple weeks ago for Dr. [name] 2 CD's (1 from [facility] and 1 [facility],) she needs to pickup today around noon. Callback. [phone] or [phone]

## 2022-01-15 NOTE — Telephone Encounter (Signed)
She is planning on bringing them back. She just wants to take them with her to see someone this afternoon. Not sure who.

## 2022-01-15 NOTE — Telephone Encounter (Signed)
Patient aware discs at front desk and she will pick them up but bring them back for her appt on the 14th

## 2022-01-19 ENCOUNTER — Ambulatory Visit: Payer: Medicare Other | Admitting: Orthopaedic Surgery

## 2022-01-19 ENCOUNTER — Encounter: Payer: Self-pay | Admitting: Orthopaedic Surgery

## 2022-01-19 ENCOUNTER — Other Ambulatory Visit: Payer: Self-pay

## 2022-01-19 DIAGNOSIS — T8484XD Pain due to internal orthopedic prosthetic devices, implants and grafts, subsequent encounter: Secondary | ICD-10-CM | POA: Diagnosis not present

## 2022-01-19 DIAGNOSIS — Z96652 Presence of left artificial knee joint: Secondary | ICD-10-CM

## 2022-01-19 DIAGNOSIS — G8929 Other chronic pain: Secondary | ICD-10-CM

## 2022-01-19 DIAGNOSIS — M25562 Pain in left knee: Secondary | ICD-10-CM

## 2022-01-19 NOTE — Progress Notes (Signed)
The patient is an 81 year old female well-known to me.  She is now 16 months out from a left total knee arthroplasty done by one of my colleagues in town.  The knee is still incredibly painful to her and sometimes feels like it is going to give way.  On previous exams I did not feel like the knee was unstable.  A three-phase bone scan did not show prosthetic loosening but did show uptake around the knee suggesting synovitis.  However these studies are usually not as reliable until someone is much further out from knee replacement surgery.  She is also dealing with significant left-sided sciatica.  She did bring the x-rays of her pelvis and hips today as well as her lumbar spine.  She has an MRI of her lumbar spine as well but she is seeing Dr. Danielle Dess.  She is ambulate with crutches today.  She also has a significantly red left eye and she is going to see her primary care physician today about that.  Both hips move smoothly and fluidly no proximal rotation.  She does have pain in sciatic area and in the ischium on both sides more so on the left than the right.  Her left knee is just slightly swollen with full range of motion and no blocks to rotation.  There is some slight laxity ligamentously but not worrisome.  I did see the plain films of her pelvis and lumbar spine.  There is significant calcifications around both ischium.  There is mild to mod arthritic changes in the left hip and the right hip.  At this point from a knee standpoint, I have recommended a synovectomy as well as a polyliner exchange.  I cannot guarantee that this would help alleviate all of her pain given her findings but I think this would be the next reasonable step.  She wants to think about this and maybe give her knee 3 more months.  I agree with this plan as well.  If things worsen she will let me know.  If not we will see her back in 3 months but no x-rays are needed.

## 2022-01-24 ENCOUNTER — Emergency Department (HOSPITAL_COMMUNITY): Payer: Medicare Other

## 2022-01-24 ENCOUNTER — Emergency Department (HOSPITAL_COMMUNITY)
Admission: EM | Admit: 2022-01-24 | Discharge: 2022-01-24 | Disposition: A | Discharge status: Home / Self Care | Payer: Medicare Other | Attending: Emergency Medicine | Admitting: Emergency Medicine

## 2022-01-24 ENCOUNTER — Emergency Department (HOSPITAL_BASED_OUTPATIENT_CLINIC_OR_DEPARTMENT_OTHER): Payer: Medicare Other

## 2022-01-24 ENCOUNTER — Encounter (HOSPITAL_COMMUNITY): Payer: Self-pay | Admitting: Emergency Medicine

## 2022-01-24 ENCOUNTER — Other Ambulatory Visit: Payer: Self-pay

## 2022-01-24 DIAGNOSIS — J209 Acute bronchitis, unspecified: Secondary | ICD-10-CM | POA: Insufficient documentation

## 2022-01-24 DIAGNOSIS — M7602 Gluteal tendinitis, left hip: Secondary | ICD-10-CM | POA: Diagnosis present

## 2022-01-24 DIAGNOSIS — R609 Edema, unspecified: Secondary | ICD-10-CM

## 2022-01-24 DIAGNOSIS — Z96652 Presence of left artificial knee joint: Secondary | ICD-10-CM | POA: Insufficient documentation

## 2022-01-24 DIAGNOSIS — M7989 Other specified soft tissue disorders: Secondary | ICD-10-CM | POA: Diagnosis not present

## 2022-01-24 DIAGNOSIS — I10 Essential (primary) hypertension: Secondary | ICD-10-CM | POA: Diagnosis not present

## 2022-01-24 DIAGNOSIS — M79606 Pain in leg, unspecified: Secondary | ICD-10-CM

## 2022-01-24 DIAGNOSIS — R531 Weakness: Secondary | ICD-10-CM | POA: Diagnosis not present

## 2022-01-24 DIAGNOSIS — M79662 Pain in left lower leg: Secondary | ICD-10-CM | POA: Diagnosis not present

## 2022-01-24 DIAGNOSIS — Z79899 Other long term (current) drug therapy: Secondary | ICD-10-CM | POA: Insufficient documentation

## 2022-01-24 DIAGNOSIS — Z7982 Long term (current) use of aspirin: Secondary | ICD-10-CM | POA: Diagnosis not present

## 2022-01-24 DIAGNOSIS — J40 Bronchitis, not specified as acute or chronic: Secondary | ICD-10-CM

## 2022-01-24 MED ORDER — ALBUTEROL SULFATE HFA 108 (90 BASE) MCG/ACT IN AERS
2.0000 | INHALATION_SPRAY | Freq: Once | RESPIRATORY_TRACT | Status: AC
  Administered 2022-01-24: 2 via RESPIRATORY_TRACT
  Filled 2022-01-24: qty 6.7

## 2022-01-24 MED ORDER — TRAMADOL HCL 50 MG PO TABS: 50.0000 mg | ORAL_TABLET | Freq: Four times a day (QID) | ORAL | 0 refills | Status: DC | PRN

## 2022-01-24 MED ORDER — PREDNISONE 10 MG PO TABS: 20.0000 mg | ORAL_TABLET | Freq: Every day | ORAL | 0 refills | Status: DC

## 2022-01-24 MED ORDER — CYCLOBENZAPRINE HCL 10 MG PO TABS
5.0000 mg | ORAL_TABLET | Freq: Once | ORAL | Status: AC
  Administered 2022-01-24: 5 mg via ORAL
  Filled 2022-01-24: qty 1

## 2022-01-24 MED ORDER — DOXYCYCLINE HYCLATE 100 MG PO CAPS: 100.0000 mg | ORAL_CAPSULE | Freq: Two times a day (BID) | ORAL | 0 refills | Status: DC

## 2022-01-24 MED ORDER — PREDNISONE 20 MG PO TABS
60.0000 mg | ORAL_TABLET | Freq: Once | ORAL | Status: AC
  Administered 2022-01-24: 60 mg via ORAL
  Filled 2022-01-24: qty 3

## 2022-01-24 NOTE — ED Notes (Signed)
Pt verbalized understanding of d/c instructions, meds, and followup care. Denies questions. VSS, no distress noted. Walks to wheelchair with crutches. Wheeled out to parking lot and assisted into car.

## 2022-01-24 NOTE — ED Provider Notes (Signed)
[facility] EMERGENCY DEPARTMENT Provider Note   CSN: [id number] Arrival date & time: 01/24/22  0913     History  Chief Complaint  Patient presents with   Back Pain    Erica Frank is a 81 y.o. female.  HPI  81 year old female with past medical history of A-fib, HTN, HLD, recent left knee replacement presents the emergency department with left buttocks pain that radiates down her left leg.  This has been going on for the past 2 to 3 weeks.  Slightly worsening causing her to use a cane/crutches for ambulation secondary to pain.  She does have tramadol at home and states when she takes this does relieve her symptoms.  Denies any lower back pain, no fall or injury.  No numbness to the lower leg.  There is sometimes weakness which is pain related.  She is had some mild swelling to the left calf.  Denies any fever or recent illness.  Home Medications Prior to Admission medications   Medication Sig Start Date End Date Taking? Authorizing Provider  doxycycline (VIBRAMYCIN) 100 MG capsule Take 1 capsule (100 mg total) by mouth 2 (two) times daily. 01/24/22  Yes Yuriko Portales, Alvin Critchley, DO  predniSONE (DELTASONE) 10 MG tablet Take 2 tablets (20 mg total) by mouth daily. Take 2 tablets (20 mg total) by mouth daily for the first 7 days.  Take 1 tablet (10 mg total) by mouth daily for the remainder 5 days. 01/24/22  Yes Aljean Horiuchi, Alvin Critchley, DO  acetaminophen (TYLENOL) 325 MG tablet Take 1 tablet by mouth as needed.    [provider]  Alirocumab (PRALUENT) 75 MG/ML SOAJ Inject 75 mg into the skin every 14 (fourteen) days. 01/05/22   Adrian Prows, MD  allopurinol (ZYLOPRIM) 100 MG tablet Take 100 mg by mouth daily. 11/24/21   [provider]  ALPRAZolam Duanne Moron) 0.25 MG tablet Take 0.25 mg by mouth at bedtime. Patient not taking: Reported on 01/12/2022 04/01/21   [provider]  aspirin (ASPIRIN CHILDRENS) 81 MG chewable tablet Chew 1 tablet (81 mg total) by mouth  daily. 12/03/21   Adrian Prows, MD  BIOTIN PO Take 1 tablet by mouth at bedtime.    [provider]  Calcium Carbonate-Vit D-Min (CALCIUM 1200 PO) Take 1 tablet by mouth daily.    [provider]  Coenzyme Q10 100 MG capsule Take 100 mg by mouth daily.  Patient not taking: Reported on 01/12/2022    [provider]  Cyanocobalamin (VITAMIN B 12 PO) Take 1 tablet by mouth daily.     [provider]  ezetimibe-simvastatin (VYTORIN) 10-40 MG tablet TAKE 1 TABLET BY MOUTH DAILY 12/28/21   Adrian Prows, MD  isosorbide mononitrate (IMDUR) 60 MG 24 hr tablet TAKE 1 TABLET(60 MG) BY MOUTH DAILY 03/09/21   Adrian Prows, MD  lisinopril (ZESTRIL) 5 MG tablet Take 1 tablet (5 mg total) by mouth daily. 02/11/21   Adrian Prows, MD  methocarbamol (ROBAXIN) 500 MG tablet Take 1 tablet (500 mg total) by mouth every 6 (six) hours as needed for muscle spasms. Patient not taking: Reported on 01/12/2022 10/07/20   Danae Orleans, PA-C  Multiple Vitamins-Minerals (MULTIVITAMIN ADULT PO) Take 1 tablet by mouth daily.     [provider]  nitroGLYCERIN (NITROSTAT) 0.4 MG SL tablet Place 1 tablet (0.4 mg total) under the tongue every 5 (five) minutes as needed for chest pain. 12/20/18   Park Liter, MD  saccharomyces boulardii (FLORASTOR) 250 MG capsule  Take 1 capsule by mouth as needed.    [provider]  traMADol (ULTRAM) 50 MG tablet Take 1 tablet (50 mg total) by mouth every 6 (six) hours as needed. 01/24/22   Analleli Gierke, Alvin Critchley, DO  traZODone (DESYREL) 50 MG tablet Take 50-150 mg by mouth at bedtime. 12/16/21   [provider]  Turmeric 500 MG CAPS Take 1 capsule by mouth See admin instructions. Patient not taking: Reported on 01/12/2022    [provider]      Allergies    Hydrocodone and Repatha [evolocumab]    Review of Systems   Review of Systems  Constitutional:  Negative for fever.  Respiratory:  Negative for shortness of breath.   Cardiovascular:   Positive for leg swelling. Negative for chest pain.  Gastrointestinal:  Negative for abdominal pain, diarrhea and vomiting.  Musculoskeletal:        + Left buttocks and lower leg pain  Skin:  Negative for rash.  Neurological:  Negative for weakness, numbness and headaches.   Physical Exam Updated Vital Signs BP 117/82    Pulse 79    Temp 98.5 F (36.9 C) (Oral)    Resp (!) 24    SpO2 92%  Physical Exam Vitals and nursing note reviewed.  Constitutional:      Appearance: Normal appearance.  HENT:     Head: Normocephalic.     Mouth/Throat:     Mouth: Mucous membranes are moist.  Cardiovascular:     Rate and Rhythm: Normal rate.  Pulmonary:     Effort: Pulmonary effort is normal. No respiratory distress.  Abdominal:     Palpations: Abdomen is soft.     Tenderness: There is no abdominal tenderness.  Musculoskeletal:     Comments: No midline spinal/lumbar tenderness.  Slight reproducible tenderness to the left buttocks.  Positive straight leg test on the left, the leg is otherwise neurovascularly intact, very mild left upper calf swelling, knee looks unremarkable.  Skin:    General: Skin is warm.  Neurological:     Mental Status: She is alert and oriented to person, place, and time. Mental status is at baseline.  Psychiatric:        Mood and Affect: Mood normal.    ED Results / Procedures / Treatments   Labs (all labs ordered are listed, but only abnormal results are displayed) Labs Reviewed - No data to display  EKG None  Radiology DG Hip Unilat W or Wo Pelvis 2-3 Views Left  Result Date: 01/24/2022 CLINICAL DATA:  Left hip and lower back pain. EXAM: DG HIP (WITH OR WITHOUT PELVIS) 2-3V LEFT COMPARISON:  None. FINDINGS: Bones are diffusely demineralized. SI joints and symphysis pubis unremarkable. No evidence for an acute fracture in the pelvis. Degenerative changes noted in both hips, relatively symmetric. Frontal and frog-leg lateral views of the left hip show no femoral  neck fracture. IMPRESSION: Degenerative changes in both hips. No acute bony findings. Electronically Signed   By: Misty Stanley M.D.   On: 01/24/2022 13:32   VAS [place] LOWER EXTREMITY VENOUS (DVT) (ONLY [facility] & [facility])  Result Date: 01/24/2022  Lower Venous DVT Study Patient Name:  IBBIE MERE Edgley  Date of Exam:   01/24/2022 Medical Rec #: [id number]          Accession #:    [id number] Date of Birth: 19-Aug-1941         Patient Gender: F Patient Age:   63 years Exam Location:  [facility]  Procedure:      VAS [place] LOWER EXTREMITY VENOUS (DVT) Referring Phys: Marsha Gundlach --------------------------------------------------------------------------------  Indications: Edema.  Limitations: Poor ultrasound/tissue interface. Comparison Study: No prior study Performing Technologist: Sharion Dove RVS  Examination Guidelines: A complete evaluation includes B-mode imaging, spectral Doppler, color Doppler, and power Doppler as needed of all accessible portions of each vessel. Bilateral testing is considered an integral part of a complete examination. Limited examinations for reoccurring indications may be performed as noted. The reflux portion of the exam is performed with the patient in reverse Trendelenburg.  +-----+---------------+---------+-----------+----------+--------------+  RIGHT Compressibility Phasicity Spontaneity Properties Thrombus Aging  +-----+---------------+---------+-----------+----------+--------------+  CFV   Full            Yes       Yes                                    +-----+---------------+---------+-----------+----------+--------------+   +---------+---------------+---------+-----------+---------------+--------------+  LEFT      Compressibility Phasicity Spontaneity Properties      Thrombus Aging  +---------+---------------+---------+-----------+---------------+--------------+  CFV       Full            Yes       Yes                                          +---------+---------------+---------+-----------+---------------+--------------+  SFJ       Full                                                                  +---------+---------------+---------+-----------+---------------+--------------+  FV Prox   Full                                                                  +---------+---------------+---------+-----------+---------------+--------------+  FV Mid    Full                                                                  +---------+---------------+---------+-----------+---------------+--------------+  FV Distal Full                                                                  +---------+---------------+---------+-----------+---------------+--------------+  PFV       Full                                                                  +---------+---------------+---------+-----------+---------------+--------------+  POP       Full            Yes       Yes                                         +---------+---------------+---------+-----------+---------------+--------------+  PTV                                 Yes         Patent by color                                                                  Doppler.                        +---------+---------------+---------+-----------+---------------+--------------+  PERO      Full                      Yes                                         +---------+---------------+---------+-----------+---------------+--------------+    Summary: RIGHT: - No evidence of common femoral vein obstruction.  LEFT: - There is no evidence of deep vein thrombosis in the lower extremity. However, portions of this examination were limited- see technologist comments above.  - No cystic structure found in the popliteal fossa.  *See table(s) above for measurements and observations.    Preliminary     Procedures Procedures    Medications Ordered in ED Medications  albuterol (VENTOLIN HFA) 108 (90 Base) MCG/ACT inhaler 2  puff (has no administration in time range)  cyclobenzaprine (FLEXERIL) tablet 5 mg (5 mg Oral Given 01/24/22 1410)  predniSONE (DELTASONE) tablet 60 mg (60 mg Oral Given 01/24/22 1410)    ED Course/ Medical Decision Making/ A&P                           Medical Decision Making Amount and/or Complexity of Data Reviewed Radiology: ordered. ECG/medicine tests: ordered.  Risk Prescription drug management.   81 year old female presents emergency department left buttocks sharp pain that radiates down the left leg.  Atraumatic, no tenderness to palpation of the lumbar midline spine or bony prominence of the left hip.  Positive straight leg testing on the left.  She is otherwise neurovascularly intact in the left lower leg.  Mild swelling of the left calf.  X-ray of the hip is unremarkable, DVT study is negative on the left.  I believe the patient is most likely suffering from sciatic like pain radiating down the leg.  She otherwise has no back/abdominal or groin pain, low suspicion for vascular pathology.  Again she is neuro intact.  After medications here she feels improved, she is ambulated to the bathroom in a stable fashion.  She already has outpatient follow-up with neurosurgery in the next week.  Plan for outpatient symptomatic treatment.  With her current presentation do not  feel any further emergent imaging/consultation is warranted.  Side note patient has had a productive cough for the past week.  Over-the-counter medications or not improving it.  She does have end expiratory wheezing especially with coughing.  We will treat her as a bronchitis.  Plan for outpatient follow-up.  Patient at this time appears safe and stable for discharge and close outpatient follow up. Discharge plan and strict return to ED precautions discussed, patient verbalizes understanding and agreement.        Final Clinical Impression(s) / ED Diagnoses Final diagnoses:  Pain of lower extremity, unspecified  laterality  Bronchitis    Rx / DC Orders ED Discharge Orders          Ordered    traMADol (ULTRAM) 50 MG tablet  Every 6 hours PRN        01/24/22 1528    doxycycline (VIBRAMYCIN) 100 MG capsule  2 times daily        01/24/22 1528    predniSONE (DELTASONE) 10 MG tablet  Daily        01/24/22 1528              Merlin Golden, Alvin Critchley, DO 01/24/22 1540

## 2022-01-24 NOTE — Discharge Instructions (Signed)
You have been seen and discharged from the emergency department.  Your x-ray was negative for fracture, your ultrasound was negative for DVT.  I believe that you are suffering from sciatic leg pain.  Continue to take your tramadol as directed.  Take steroids as prescribed.  In regards to cough/respiratory infection take antibiotic and use inhaler as directed.  Call the neurosurgery office and encourage an earlier appointment.  Follow-up with your primary provider for further evaluation and further care. Take home medications as prescribed. If you have any worsening symptoms or further concerns for your health please return to an emergency department for further evaluation.

## 2022-01-24 NOTE — Progress Notes (Signed)
VASCULAR LAB    Left lower extremity venous duplex has been performed.  See CV proc for preliminary results.  Gave verbal results to Dr. Marianne Sofia, Cullin Dishman, RVT 01/24/2022, 1:47 PM

## 2022-01-24 NOTE — ED Triage Notes (Signed)
Pt reports L sided back pain x 2-3 weeks with radiation down L leg.  She is having difficulty standing due to pain.  Appt with Dr. Danielle Dess on 3/1.  Pt using crutches to ambulate.

## 2022-01-26 NOTE — Telephone Encounter (Signed)
FYI. Scheduled for 04/06/22.  ?

## 2022-02-01 ENCOUNTER — Other Ambulatory Visit: Payer: Self-pay | Admitting: Neurological Surgery

## 2022-02-01 ENCOUNTER — Telehealth: Payer: Self-pay

## 2022-02-01 NOTE — Telephone Encounter (Signed)
Pt is having Sciatica pain. She is having surgery this Thursday 02/04/2022. This has been going on for a while but is getting worse.

## 2022-02-01 NOTE — Telephone Encounter (Signed)
Yes she can. I can also Rx Ultram if she wants

## 2022-02-01 NOTE — Telephone Encounter (Signed)
Patient called and stated that she is in a lot of pain and wants to know if she can take Aleve. Please advise.

## 2022-02-02 ENCOUNTER — Encounter (HOSPITAL_COMMUNITY)
Admission: RE | Disposition: A | Discharge status: Home / Self Care | Payer: Self-pay | Source: Home / Self Care | Attending: Neurological Surgery

## 2022-02-02 ENCOUNTER — Observation Stay (HOSPITAL_COMMUNITY)
Admission: RE | Admit: 2022-02-02 | Discharge: 2022-02-03 | Disposition: A | Discharge status: Home / Self Care | Payer: Medicare Other | Attending: Neurological Surgery | Admitting: Neurological Surgery

## 2022-02-02 ENCOUNTER — Encounter (HOSPITAL_COMMUNITY): Payer: Self-pay | Admitting: Neurological Surgery

## 2022-02-02 ENCOUNTER — Ambulatory Visit (HOSPITAL_COMMUNITY): Payer: Medicare Other | Admitting: Anesthesiology

## 2022-02-02 ENCOUNTER — Ambulatory Visit (HOSPITAL_BASED_OUTPATIENT_CLINIC_OR_DEPARTMENT_OTHER): Payer: Medicare Other | Admitting: Anesthesiology

## 2022-02-02 ENCOUNTER — Ambulatory Visit (HOSPITAL_COMMUNITY): Payer: Medicare Other

## 2022-02-02 ENCOUNTER — Other Ambulatory Visit: Payer: Self-pay

## 2022-02-02 DIAGNOSIS — Z96652 Presence of left artificial knee joint: Secondary | ICD-10-CM | POA: Diagnosis not present

## 2022-02-02 DIAGNOSIS — Z7982 Long term (current) use of aspirin: Secondary | ICD-10-CM | POA: Insufficient documentation

## 2022-02-02 DIAGNOSIS — M48061 Spinal stenosis, lumbar region without neurogenic claudication: Principal | ICD-10-CM | POA: Insufficient documentation

## 2022-02-02 DIAGNOSIS — Z79899 Other long term (current) drug therapy: Secondary | ICD-10-CM | POA: Diagnosis not present

## 2022-02-02 DIAGNOSIS — I4891 Unspecified atrial fibrillation: Secondary | ICD-10-CM | POA: Insufficient documentation

## 2022-02-02 DIAGNOSIS — I25119 Atherosclerotic heart disease of native coronary artery with unspecified angina pectoris: Secondary | ICD-10-CM

## 2022-02-02 DIAGNOSIS — D649 Anemia, unspecified: Secondary | ICD-10-CM

## 2022-02-02 DIAGNOSIS — Z20822 Contact with and (suspected) exposure to covid-19: Secondary | ICD-10-CM | POA: Diagnosis not present

## 2022-02-02 DIAGNOSIS — M5417 Radiculopathy, lumbosacral region: Secondary | ICD-10-CM

## 2022-02-02 DIAGNOSIS — I2510 Atherosclerotic heart disease of native coronary artery without angina pectoris: Secondary | ICD-10-CM | POA: Diagnosis not present

## 2022-02-02 DIAGNOSIS — I10 Essential (primary) hypertension: Secondary | ICD-10-CM

## 2022-02-02 DIAGNOSIS — D696 Thrombocytopenia, unspecified: Secondary | ICD-10-CM

## 2022-02-02 DIAGNOSIS — Z419 Encounter for procedure for purposes other than remedying health state, unspecified: Secondary | ICD-10-CM

## 2022-02-02 HISTORY — PX: LUMBAR LAMINECTOMY/DECOMPRESSION MICRODISCECTOMY: SHX5026

## 2022-02-02 LAB — CBC
HCT: 44 % (ref 36.0–46.0)
Hemoglobin: 14.8 g/dL (ref 12.0–15.0)
MCH: 32 pg (ref 26.0–34.0)
MCHC: 33.6 g/dL (ref 30.0–36.0)
MCV: 95.2 fL (ref 80.0–100.0)
Platelets: 354 10*3/uL (ref 150–400)
RBC: 4.62 MIL/uL (ref 3.87–5.11)
RDW: 14.2 % (ref 11.5–15.5)
WBC: 9.1 10*3/uL (ref 4.0–10.5)
nRBC: 0 % (ref 0.0–0.2)

## 2022-02-02 LAB — TYPE AND SCREEN
ABO/RH(D): O POS
Antibody Screen: NEGATIVE

## 2022-02-02 LAB — BASIC METABOLIC PANEL
Anion gap: 8 (ref 5–15)
BUN: 21 mg/dL (ref 8–23)
CO2: 28 mmol/L (ref 22–32)
Calcium: 8.7 mg/dL — ABNORMAL LOW (ref 8.9–10.3)
Chloride: 100 mmol/L (ref 98–111)
Creatinine, Ser: 0.96 mg/dL (ref 0.44–1.00)
GFR, Estimated: 60 mL/min — ABNORMAL LOW (ref >60)
Glucose, Bld: 100 mg/dL — ABNORMAL HIGH (ref 70–99)
Potassium: 4.3 mmol/L (ref 3.5–5.1)
Sodium: 136 mmol/L (ref 135–145)

## 2022-02-02 LAB — SURGICAL PCR SCREEN
MRSA, PCR: NEGATIVE
Staphylococcus aureus: NEGATIVE

## 2022-02-02 LAB — SARS CORONAVIRUS 2 BY RT PCR (HOSPITAL ORDER, PERFORMED IN ~~LOC~~ HOSPITAL LAB): SARS Coronavirus 2: NEGATIVE

## 2022-02-02 SURGERY — LUMBAR LAMINECTOMY/DECOMPRESSION MICRODISCECTOMY 2 LEVELS
Anesthesia: General | Site: Back | Laterality: Left

## 2022-02-02 MED ORDER — SUGAMMADEX SODIUM 200 MG/2ML IV SOLN
INTRAVENOUS | Status: DC | PRN
  Administered 2022-02-02: 200 mg via INTRAVENOUS

## 2022-02-02 MED ORDER — EPHEDRINE SULFATE-NACL 50-0.9 MG/10ML-% IV SOSY
PREFILLED_SYRINGE | INTRAVENOUS | Status: DC | PRN
  Administered 2022-02-02: 5 mg via INTRAVENOUS

## 2022-02-02 MED ORDER — CEFAZOLIN SODIUM-DEXTROSE 2-4 GM/100ML-% IV SOLN
2.0000 g | Freq: Three times a day (TID) | INTRAVENOUS | Status: AC
  Administered 2022-02-02 – 2022-02-03 (×2): 2 g via INTRAVENOUS
  Filled 2022-02-02 (×2): qty 100

## 2022-02-02 MED ORDER — DOCUSATE SODIUM 100 MG PO CAPS: 100.0000 mg | ORAL_CAPSULE | Freq: Every day | ORAL | Status: DC | PRN

## 2022-02-02 MED ORDER — 0.9 % SODIUM CHLORIDE (POUR BTL) OPTIME
TOPICAL | Status: DC | PRN
  Administered 2022-02-02: 1000 mL

## 2022-02-02 MED ORDER — AMISULPRIDE (ANTIEMETIC) 5 MG/2ML IV SOLN: 10.0000 mg | Freq: Once | INTRAVENOUS | Status: DC | PRN

## 2022-02-02 MED ORDER — SODIUM CHLORIDE 0.9% FLUSH: 3.0000 mL | INTRAVENOUS | Status: DC | PRN

## 2022-02-02 MED ORDER — ONDANSETRON HCL 4 MG/2ML IJ SOLN: 4.0000 mg | Freq: Four times a day (QID) | INTRAMUSCULAR | Status: DC | PRN

## 2022-02-02 MED ORDER — PHENYLEPHRINE HCL-NACL 20-0.9 MG/250ML-% IV SOLN
INTRAVENOUS | Status: DC | PRN
  Administered 2022-02-02: 50 ug/min via INTRAVENOUS

## 2022-02-02 MED ORDER — PHENOL 1.4 % MT LIQD: 1.0000 | OROMUCOSAL | Status: DC | PRN

## 2022-02-02 MED ORDER — ORAL CARE MOUTH RINSE: 15.0000 mL | Freq: Once | OROMUCOSAL | Status: AC

## 2022-02-02 MED ORDER — METHOCARBAMOL 1000 MG/10ML IJ SOLN
500.0000 mg | Freq: Four times a day (QID) | INTRAVENOUS | Status: DC | PRN
  Filled 2022-02-02: qty 5

## 2022-02-02 MED ORDER — CHLORHEXIDINE GLUCONATE 0.12 % MT SOLN
15.0000 mL | Freq: Once | OROMUCOSAL | Status: AC
  Administered 2022-02-02: 15 mL via OROMUCOSAL
  Filled 2022-02-02: qty 15

## 2022-02-02 MED ORDER — MENTHOL 3 MG MT LOZG: 1.0000 | LOZENGE | OROMUCOSAL | Status: DC | PRN

## 2022-02-02 MED ORDER — FENTANYL CITRATE (PF) 100 MCG/2ML IJ SOLN
25.0000 ug | Freq: Once | INTRAMUSCULAR | Status: AC
  Administered 2022-02-02: 25 ug via INTRAVENOUS

## 2022-02-02 MED ORDER — LISINOPRIL 10 MG PO TABS
5.0000 mg | ORAL_TABLET | Freq: Every day | ORAL | Status: DC
  Administered 2022-02-02: 5 mg via ORAL
  Filled 2022-02-02 (×2): qty 1

## 2022-02-02 MED ORDER — SIMVASTATIN 20 MG PO TABS
40.0000 mg | ORAL_TABLET | Freq: Every day | ORAL | Status: DC
  Filled 2022-02-02: qty 2

## 2022-02-02 MED ORDER — TRAMADOL HCL 50 MG PO TABS: 50.0000 mg | ORAL_TABLET | Freq: Four times a day (QID) | ORAL | Status: DC | PRN

## 2022-02-02 MED ORDER — POLYETHYLENE GLYCOL 3350 17 G PO PACK: 17.0000 g | PACK | Freq: Every day | ORAL | Status: DC | PRN

## 2022-02-02 MED ORDER — METHOCARBAMOL 500 MG PO TABS
500.0000 mg | ORAL_TABLET | Freq: Four times a day (QID) | ORAL | Status: DC | PRN
  Administered 2022-02-02: 500 mg via ORAL
  Filled 2022-02-02: qty 1

## 2022-02-02 MED ORDER — KETOROLAC TROMETHAMINE 15 MG/ML IJ SOLN
7.5000 mg | Freq: Four times a day (QID) | INTRAMUSCULAR | Status: DC
  Administered 2022-02-02 – 2022-02-03 (×3): 7.5 mg via INTRAVENOUS
  Filled 2022-02-02 (×3): qty 1

## 2022-02-02 MED ORDER — THROMBIN 5000 UNITS EX SOLR
CUTANEOUS | Status: AC
  Filled 2022-02-02: qty 15000

## 2022-02-02 MED ORDER — ROCURONIUM BROMIDE 10 MG/ML (PF) SYRINGE
PREFILLED_SYRINGE | INTRAVENOUS | Status: DC | PRN
  Administered 2022-02-02: 40 mg via INTRAVENOUS

## 2022-02-02 MED ORDER — FENTANYL CITRATE (PF) 250 MCG/5ML IJ SOLN
INTRAMUSCULAR | Status: AC
  Filled 2022-02-02: qty 5

## 2022-02-02 MED ORDER — DEXAMETHASONE SODIUM PHOSPHATE 10 MG/ML IJ SOLN
INTRAMUSCULAR | Status: DC | PRN
  Administered 2022-02-02: 10 mg via INTRAVENOUS

## 2022-02-02 MED ORDER — LIDOCAINE 2% (20 MG/ML) 5 ML SYRINGE
INTRAMUSCULAR | Status: DC | PRN
  Administered 2022-02-02: 80 mg via INTRAVENOUS

## 2022-02-02 MED ORDER — SODIUM CHLORIDE 0.9 % IV SOLN: 250.0000 mL | INTRAVENOUS | Status: DC

## 2022-02-02 MED ORDER — FENTANYL CITRATE (PF) 250 MCG/5ML IJ SOLN
INTRAMUSCULAR | Status: DC | PRN
  Administered 2022-02-02 (×3): 50 ug via INTRAVENOUS

## 2022-02-02 MED ORDER — ACETAMINOPHEN 650 MG RE SUPP: 650.0000 mg | RECTAL | Status: DC | PRN

## 2022-02-02 MED ORDER — THROMBIN 5000 UNITS EX SOLR
OROMUCOSAL | Status: DC | PRN
  Administered 2022-02-02: 5 mL via TOPICAL

## 2022-02-02 MED ORDER — ISOSORBIDE MONONITRATE ER 60 MG PO TB24
60.0000 mg | ORAL_TABLET | Freq: Every day | ORAL | Status: DC
  Administered 2022-02-03: 60 mg via ORAL
  Filled 2022-02-02: qty 1

## 2022-02-02 MED ORDER — SUCCINYLCHOLINE CHLORIDE 200 MG/10ML IV SOSY
PREFILLED_SYRINGE | INTRAVENOUS | Status: DC | PRN
  Administered 2022-02-02: 160 mg via INTRAVENOUS

## 2022-02-02 MED ORDER — SACCHAROMYCES BOULARDII 250 MG PO CAPS: 250.0000 mg | ORAL_CAPSULE | Freq: Every day | ORAL | Status: DC | PRN

## 2022-02-02 MED ORDER — DM-GUAIFENESIN ER 30-600 MG PO TB12
1.0000 | ORAL_TABLET | Freq: Every day | ORAL | Status: DC | PRN
  Filled 2022-02-02: qty 1

## 2022-02-02 MED ORDER — ONDANSETRON HCL 4 MG/2ML IJ SOLN
INTRAMUSCULAR | Status: DC | PRN
  Administered 2022-02-02: 4 mg via INTRAVENOUS

## 2022-02-02 MED ORDER — CEFAZOLIN SODIUM-DEXTROSE 2-4 GM/100ML-% IV SOLN
INTRAVENOUS | Status: AC
  Filled 2022-02-02: qty 100

## 2022-02-02 MED ORDER — HYDROMORPHONE HCL 2 MG PO TABS: 2.0000 mg | ORAL_TABLET | Freq: Four times a day (QID) | ORAL | Status: DC | PRN

## 2022-02-02 MED ORDER — METHOCARBAMOL 500 MG PO TABS: 500.0000 mg | ORAL_TABLET | Freq: Four times a day (QID) | ORAL | Status: DC | PRN

## 2022-02-02 MED ORDER — ALPRAZOLAM 0.5 MG PO TABS: 0.2500 mg | ORAL_TABLET | Freq: Every evening | ORAL | Status: DC | PRN

## 2022-02-02 MED ORDER — HYDROMORPHONE HCL 1 MG/ML IJ SOLN: 0.5000 mg | INTRAMUSCULAR | Status: DC | PRN

## 2022-02-02 MED ORDER — ALLOPURINOL 100 MG PO TABS
100.0000 mg | ORAL_TABLET | Freq: Every day | ORAL | Status: DC | PRN
  Filled 2022-02-02: qty 1

## 2022-02-02 MED ORDER — EZETIMIBE-SIMVASTATIN 10-40 MG PO TABS
1.0000 | ORAL_TABLET | Freq: Every day | ORAL | Status: DC
  Filled 2022-02-02 (×2): qty 1

## 2022-02-02 MED ORDER — FENTANYL CITRATE (PF) 100 MCG/2ML IJ SOLN
INTRAMUSCULAR | Status: AC
  Administered 2022-02-02: 25 ug
  Filled 2022-02-02: qty 2

## 2022-02-02 MED ORDER — ACETAMINOPHEN 500 MG PO TABS
1000.0000 mg | ORAL_TABLET | Freq: Once | ORAL | Status: DC
  Filled 2022-02-02: qty 2

## 2022-02-02 MED ORDER — PHENYLEPHRINE 40 MCG/ML (10ML) SYRINGE FOR IV PUSH (FOR BLOOD PRESSURE SUPPORT)
PREFILLED_SYRINGE | INTRAVENOUS | Status: DC | PRN
  Administered 2022-02-02: 80 ug via INTRAVENOUS

## 2022-02-02 MED ORDER — PROPOFOL 10 MG/ML IV BOLUS
INTRAVENOUS | Status: DC | PRN
  Administered 2022-02-02: 150 mg via INTRAVENOUS

## 2022-02-02 MED ORDER — HEMOSTATIC AGENTS (NO CHARGE) OPTIME
TOPICAL | Status: DC | PRN
  Administered 2022-02-02: 1 via TOPICAL

## 2022-02-02 MED ORDER — FENTANYL CITRATE (PF) 100 MCG/2ML IJ SOLN
INTRAMUSCULAR | Status: AC
  Filled 2022-02-02: qty 2

## 2022-02-02 MED ORDER — THROMBIN 5000 UNITS EX SOLR
CUTANEOUS | Status: DC | PRN
  Administered 2022-02-02 (×2): 5000 [IU] via TOPICAL

## 2022-02-02 MED ORDER — ACETAMINOPHEN 325 MG PO TABS
650.0000 mg | ORAL_TABLET | ORAL | Status: DC | PRN
  Administered 2022-02-03: 650 mg via ORAL
  Filled 2022-02-02: qty 2

## 2022-02-02 MED ORDER — BISACODYL 10 MG RE SUPP: 10.0000 mg | Freq: Every day | RECTAL | Status: DC | PRN

## 2022-02-02 MED ORDER — GABAPENTIN 300 MG PO CAPS: 300.0000 mg | ORAL_CAPSULE | Freq: Three times a day (TID) | ORAL | Status: DC | PRN

## 2022-02-02 MED ORDER — CHLORHEXIDINE GLUCONATE CLOTH 2 % EX PADS: 6.0000 | MEDICATED_PAD | Freq: Once | CUTANEOUS | Status: DC

## 2022-02-02 MED ORDER — TRAZODONE HCL 50 MG PO TABS
50.0000 mg | ORAL_TABLET | Freq: Every day | ORAL | Status: DC
  Administered 2022-02-02: 100 mg via ORAL
  Filled 2022-02-02: qty 2

## 2022-02-02 MED ORDER — SODIUM CHLORIDE 0.9% FLUSH
3.0000 mL | Freq: Two times a day (BID) | INTRAVENOUS | Status: DC
  Administered 2022-02-02: 3 mL via INTRAVENOUS

## 2022-02-02 MED ORDER — FLEET ENEMA 7-19 GM/118ML RE ENEM: 1.0000 | ENEMA | Freq: Once | RECTAL | Status: DC | PRN

## 2022-02-02 MED ORDER — EZETIMIBE 10 MG PO TABS
10.0000 mg | ORAL_TABLET | Freq: Every day | ORAL | Status: DC
  Filled 2022-02-02 (×2): qty 1

## 2022-02-02 MED ORDER — ONDANSETRON HCL 4 MG PO TABS: 4.0000 mg | ORAL_TABLET | Freq: Three times a day (TID) | ORAL | Status: DC | PRN

## 2022-02-02 MED ORDER — LACTATED RINGERS IV SOLN: INTRAVENOUS | Status: DC

## 2022-02-02 MED ORDER — CHLORHEXIDINE GLUCONATE CLOTH 2 % EX PADS
6.0000 | MEDICATED_PAD | Freq: Once | CUTANEOUS | Status: DC
Start: 2022-02-02 — End: 2022-02-02

## 2022-02-02 MED ORDER — BUPIVACAINE HCL (PF) 0.5 % IJ SOLN
INTRAMUSCULAR | Status: AC
  Filled 2022-02-02: qty 30

## 2022-02-02 MED ORDER — DOCUSATE SODIUM 100 MG PO CAPS
100.0000 mg | ORAL_CAPSULE | Freq: Two times a day (BID) | ORAL | Status: DC
  Administered 2022-02-02 – 2022-02-03 (×2): 100 mg via ORAL
  Filled 2022-02-02 (×2): qty 1

## 2022-02-02 MED ORDER — POLYVINYL ALCOHOL 1.4 % OP SOLN: 1.0000 [drp] | Freq: Every day | OPHTHALMIC | Status: DC | PRN

## 2022-02-02 MED ORDER — ONDANSETRON HCL 4 MG PO TABS: 4.0000 mg | ORAL_TABLET | Freq: Four times a day (QID) | ORAL | Status: DC | PRN

## 2022-02-02 MED ORDER — LIDOCAINE-EPINEPHRINE 1 %-1:100000 IJ SOLN
INTRAMUSCULAR | Status: AC
  Filled 2022-02-02: qty 1

## 2022-02-02 MED ORDER — BUPIVACAINE HCL (PF) 0.5 % IJ SOLN
INTRAMUSCULAR | Status: DC | PRN
  Administered 2022-02-02: 25 mL
  Administered 2022-02-02: 5 mL

## 2022-02-02 MED ORDER — SENNA 8.6 MG PO TABS
1.0000 | ORAL_TABLET | Freq: Two times a day (BID) | ORAL | Status: DC
  Administered 2022-02-02 – 2022-02-03 (×2): 8.6 mg via ORAL
  Filled 2022-02-02 (×2): qty 1

## 2022-02-02 MED ORDER — FENTANYL CITRATE PF 50 MCG/ML IJ SOSY: 25.0000 ug | PREFILLED_SYRINGE | Freq: Once | INTRAMUSCULAR | Status: DC

## 2022-02-02 MED ORDER — CEFAZOLIN SODIUM-DEXTROSE 2-4 GM/100ML-% IV SOLN
2.0000 g | INTRAVENOUS | Status: AC
  Administered 2022-02-02: 2 g via INTRAVENOUS

## 2022-02-02 MED ORDER — LIDOCAINE-EPINEPHRINE 1 %-1:100000 IJ SOLN
INTRAMUSCULAR | Status: DC | PRN
  Administered 2022-02-02: 5 mL

## 2022-02-02 MED ORDER — LIDOCAINE 5 % EX PTCH: 1.0000 | MEDICATED_PATCH | Freq: Every day | CUTANEOUS | Status: DC | PRN

## 2022-02-02 MED ORDER — PROPOFOL 10 MG/ML IV BOLUS
INTRAVENOUS | Status: AC
  Filled 2022-02-02: qty 20

## 2022-02-02 MED ORDER — PROPOFOL 500 MG/50ML IV EMUL
INTRAVENOUS | Status: DC | PRN
  Administered 2022-02-02: 100 ug/kg/min via INTRAVENOUS

## 2022-02-02 MED ORDER — ONDANSETRON HCL 4 MG/2ML IJ SOLN: 4.0000 mg | Freq: Once | INTRAMUSCULAR | Status: DC | PRN

## 2022-02-02 MED ORDER — HYDROMORPHONE HCL 1 MG/ML IJ SOLN: 0.2500 mg | INTRAMUSCULAR | Status: DC | PRN

## 2022-02-02 SURGICAL SUPPLY — 47 items
ADH SKN CLS APL DERMABOND .7 (GAUZE/BANDAGES/DRESSINGS) ×1
BAG COUNTER SPONGE SURGICOUNT (BAG) ×3 IMPLANT
BAG SPNG CNTER NS LX DISP (BAG) ×1
BAND INSRT 18 STRL LF DISP RB (MISCELLANEOUS)
BAND RUBBER #18 3X1/16 STRL (MISCELLANEOUS) IMPLANT
BLADE CLIPPER SURG (BLADE) IMPLANT
BUR ACORN 6.0 (BURR) IMPLANT
BUR MATCHSTICK NEURO 3.0 LAGG (BURR) ×3 IMPLANT
CANISTER SUCT 3000ML PPV (MISCELLANEOUS) ×3 IMPLANT
DECANTER SPIKE VIAL GLASS SM (MISCELLANEOUS) ×3 IMPLANT
DERMABOND ADVANCED (GAUZE/BANDAGES/DRESSINGS) ×1
DERMABOND ADVANCED .7 DNX12 (GAUZE/BANDAGES/DRESSINGS) ×2 IMPLANT
DRAPE HALF SHEET 40X57 (DRAPES) IMPLANT
DRAPE LAPAROTOMY 100X72X124 (DRAPES) ×3 IMPLANT
DRAPE MICROSCOPE LEICA (MISCELLANEOUS) ×1 IMPLANT
DURAPREP 26ML APPLICATOR (WOUND CARE) ×3 IMPLANT
ELECT REM PT RETURN 9FT ADLT (ELECTROSURGICAL) ×2
ELECTRODE REM PT RTRN 9FT ADLT (ELECTROSURGICAL) ×2 IMPLANT
GAUZE 4X4 16PLY ~~LOC~~+RFID DBL (SPONGE) ×1 IMPLANT
GAUZE SPONGE 4X4 12PLY STRL (GAUZE/BANDAGES/DRESSINGS) ×2 IMPLANT
GLOVE SURG LTX SZ8.5 (GLOVE) ×3 IMPLANT
GLOVE SURG UNDER POLY LF SZ8.5 (GLOVE) ×3 IMPLANT
GOWN STRL REUS W/ TWL LRG LVL3 (GOWN DISPOSABLE) IMPLANT
GOWN STRL REUS W/ TWL XL LVL3 (GOWN DISPOSABLE) IMPLANT
GOWN STRL REUS W/TWL 2XL LVL3 (GOWN DISPOSABLE) ×3 IMPLANT
GOWN STRL REUS W/TWL LRG LVL3 (GOWN DISPOSABLE) ×6
GOWN STRL REUS W/TWL XL LVL3 (GOWN DISPOSABLE)
HEMOSTAT POWDER KIT SURGIFOAM (HEMOSTASIS) ×1 IMPLANT
KIT BASIN OR (CUSTOM PROCEDURE TRAY) ×3 IMPLANT
KIT TURNOVER KIT B (KITS) ×3 IMPLANT
NDL SPNL 20GX3.5 QUINCKE YW (NEEDLE) IMPLANT
NEEDLE HYPO 22GX1.5 SAFETY (NEEDLE) ×3 IMPLANT
NEEDLE SPNL 20GX3.5 QUINCKE YW (NEEDLE) ×4 IMPLANT
NS IRRIG 1000ML POUR BTL (IV SOLUTION) ×3 IMPLANT
PACK LAMINECTOMY NEURO (CUSTOM PROCEDURE TRAY) ×3 IMPLANT
PAD ARMBOARD 7.5X6 YLW CONV (MISCELLANEOUS) ×9 IMPLANT
PATTIES SURGICAL .5 X1 (DISPOSABLE) ×3 IMPLANT
SPONGE SURGIFOAM ABS GEL SZ50 (HEMOSTASIS) ×3 IMPLANT
SPONGE T-LAP 4X18 ~~LOC~~+RFID (SPONGE) ×1 IMPLANT
SUT VIC AB 1 CT1 18XBRD ANBCTR (SUTURE) ×2 IMPLANT
SUT VIC AB 1 CT1 8-18 (SUTURE) ×2
SUT VIC AB 2-0 CP2 18 (SUTURE) ×3 IMPLANT
SUT VIC AB 3-0 SH 8-18 (SUTURE) ×3 IMPLANT
SUT VIC AB 4-0 RB1 18 (SUTURE) ×3 IMPLANT
TOWEL GREEN STERILE (TOWEL DISPOSABLE) ×3 IMPLANT
TOWEL GREEN STERILE FF (TOWEL DISPOSABLE) ×3 IMPLANT
WATER STERILE IRR 1000ML POUR (IV SOLUTION) ×3 IMPLANT

## 2022-02-02 NOTE — Op Note (Signed)
Date of surgery: 02/02/2022 Preoperative diagnosis: Left lumbar radiculopathy L2-3 and L5-S1 left with left lumbar stenosis, degenerative scoliosis. Postoperative diagnosis: Same Procedure: Left lumbar laminotomy L2-3 and L5-S1 with decompression of the L3 and the S1 nerve roots. Surgeon: Kristeen Miss Anesthesia: General endotracheal Indications: Mayukha is an 81 year old female who has had severe left lumbar radiculopathy she had been scheduled for a laminotomy and foraminotomies but had awoken with much worse pain and severe weakness such that she could not bear weight on her left leg.  She was brought in emergently to undergo surgical decompression at the L2-3 and L5-S1 levels.  Procedure: Patient was brought to the operating room supine on the stretcher.  After the smooth induction of general tracheal anesthesia she was carefully turned prone on to the operating table.  Bony prominences were appropriately padded and protected.  The back was prepped with alcohol and DuraPrep and then draped in a sterile fashion to expose the L2-3 and L5-S1 levels which were identified positively with radiograph.  Skin was infiltrated with lidocaine with epinephrine and half percent Marcaine in a 50-50 mixture and then a small vertical incision was made over L5-S1.  The dissection was carried down to this lumbodorsal fascia which was opened on the left side of midline and subperiosteal dissection identified the interlaminar space at L5-S1.  Self-retaining retractor was placed in the wound and then a laminotomy was created removing the inferior margin lamina of L5 out to the medial wall the facet.  The facetectomy then opened some thickened redundant yellow ligament over the S1 nerve root which allowed for good decompression of it then by carefully inspecting the area immediately under the S1 nerve root there was noted to be a substantial fragment of disc at the level of the space.  This was removed in several moderate-sized  pieces and allow for further decompression of the S1 nerve root the disc base was noted to be in this area but was tightly closed and was not able to be entered.  With this hemostasis in the soft tissues was obtained meticulously the path of the S1 nerve root was noted be well decompressed and half percent Marcaine infusion was placed in the paraspinous tissues for a total of 10 cc.  Next attention was turned to the L2-3 level where a small vertical incision was created and the dissection was carried down through the lumbodorsal fascia in the subperiosteal plane to expose the interlaminar space at L2-L3.  A self-retaining retractor was placed deep in the wound and then a laminotomy was created at L2-L3 to expose the common dural tube and thickened redundant ligament in this region care was taken to maintain the intactness of the facet joint itself but a partial mesial facetectomy was performed as the L ligament was lifted ultimately the dura was identified the dissection was taken down to identify the path of the L3 nerve root as it came off the common dural tube.  This was decompressed of a significant quantity of facet hypertrophy material at the L2-3 level once this area was decompressed the common dural tube could easily be sounded cephalad and caudad and the L3 nerve root was noted to be much more roomy and this travel to the foramen.  With this hemostasis was obtained and 10 cc of half percent Marcaine was injected into the paraspinous fascia in this region the lumbodorsal fascia was then closed with 2-0 Vicryl in interrupted fashion and both incisions 3-0 Vicryl and 4-0 Vicryl was used in  the subcuticular skin Dermabond was placed on the skin.  Blood loss for the procedure was estimated at 50 cc.

## 2022-02-02 NOTE — Telephone Encounter (Signed)
Called pt, no answer. Left vm requesting call back?

## 2022-02-02 NOTE — Anesthesia Preprocedure Evaluation (Addendum)
Anesthesia Evaluation  Patient identified by MRN, date of birth, ID band Patient awake    Reviewed: Allergy & Precautions, NPO status , Patient's Chart, lab work & pertinent test results  Airway Mallampati: II  TM Distance: >3 FB Neck ROM: Full    Dental no notable dental hx.    Pulmonary neg pulmonary ROS,    Pulmonary exam normal breath sounds clear to auscultation       Cardiovascular hypertension, + angina + CAD and + Past MI  Normal cardiovascular exam+ dysrhythmias Atrial Fibrillation  Rhythm:Regular Rate:Normal  EKG 11/12/2021: Marked sinus bradycardia at the rate of 55 bpm, leftward axis.  No evidence of ischemia..   Neuro/Psych Anxiety negative neurological ROS     GI/Hepatic GERD  ,  Endo/Other    Renal/GU      Musculoskeletal  (+) Arthritis ,   Abdominal   Peds negative pediatric ROS (+)  Hematology  (+) Blood dyscrasia, anemia ,   Anesthesia Other Findings   Reproductive/Obstetrics negative OB ROS                           Anesthesia Physical Anesthesia Plan  ASA: 3 and emergent  Anesthesia Plan: General   Post-op Pain Management: Tylenol PO (pre-op)*   Induction: Rapid sequence, Cricoid pressure planned and Intravenous  PONV Risk Score and Plan: Treatment may vary due to age or medical condition, Ondansetron and Dexamethasone  Airway Management Planned: Oral ETT  Additional Equipment:   Intra-op Plan:   Post-operative Plan: Extubation in OR  Informed Consent: I have reviewed the patients History and Physical, chart, labs and discussed the procedure including the risks, benefits and alternatives for the proposed anesthesia with the patient or authorized representative who has indicated his/her understanding and acceptance.     Dental advisory given  Plan Discussed with: Surgeon, Anesthesiologist and CRNA  Anesthesia Plan Comments: (Patient ate at ~7/730 this  AM, egg salad, etc. Surgeon informed that patient will be appropriately NPO at 1500. He does not wish to wait and has deemed her case an emergency. Will discuss the risks with the patient upon her arrival, primarily risk of aspiration. Tanna Furry, MD  02/02/22 @0845 .  Discussed with patient and daughter the risks of aspiration. They expressed understanding and wish to proceed given the patients discomfort level. I have ordered Fentanyl 25 mcg IV for pain. Will plan for GETA/RSI. Will suction stomach after induction. , MD    )      Anesthesia Quick Evaluation

## 2022-02-02 NOTE — Anesthesia Postprocedure Evaluation (Signed)
Anesthesia Post Note  Patient: Regine Christian Carmer  Procedure(s) Performed: Left Lumbar two-three, Lumbar five-Sacral one Sublaminar decompression (Left: Back)     Patient location during evaluation: PACU Anesthesia Type: General Level of consciousness: awake and alert and oriented Pain management: pain level controlled Vital Signs Assessment: post-procedure vital signs reviewed and stable Respiratory status: spontaneous breathing, nonlabored ventilation and respiratory function stable Cardiovascular status: blood pressure returned to baseline and stable Postop Assessment: no apparent nausea or vomiting Anesthetic complications: no   No notable events documented.  Last Vitals:  Vitals:   02/02/22 1425 02/02/22 1440  BP: (!) 102/57 (!) 125/99  Pulse: (!) 58 70  Resp: 15 19  Temp:    SpO2: 97% 99%    Last Pain:  Vitals:   02/02/22 1440  TempSrc:   PainSc: 0-No pain      LLE Sensation: Full sensation (02/02/22 1440)   RLE Sensation: Full sensation (02/02/22 1440)      Karalynn Cottone A.

## 2022-02-02 NOTE — Anesthesia Procedure Notes (Signed)
Procedure Name: Intubation Date/Time: 02/02/2022 12:34 PM Performed by: Janene Harvey, CRNA Pre-anesthesia Checklist: Patient identified, Emergency Drugs available, Suction available and Patient being monitored Patient Re-evaluated:Patient Re-evaluated prior to induction Oxygen Delivery Method: Circle system utilized Preoxygenation: Pre-oxygenation with 100% oxygen Induction Type: IV induction, Rapid sequence and Cricoid Pressure applied Laryngoscope Size: 4 and Mac Grade View: Grade I Tube type: Oral Tube size: 7.0 mm Number of attempts: 1 Airway Equipment and Method: Stylet and Oral airway Placement Confirmation: ETT inserted through vocal cords under direct vision, positive ETCO2 and breath sounds checked- equal and bilateral Secured at: 22 cm Tube secured with: Tape Dental Injury: Teeth and Oropharynx as per pre-operative assessment

## 2022-02-02 NOTE — H&P (Signed)
Erica Frank is an 81 y.o. female.   Chief Complaint: Left leg pain and weakness with acute worsening HPI: Erica Frank is an 81 year old individual who I had seen in the office this past Friday.  She was complaining of some significant pain that had been getting progressively worse in the left lower extremity and I had been seeing her for a couple of months because of this problem an MRI was performed in December which demonstrated significant lateral recess stenosis at L5-S1 and also at L2-3 on the left side we are treating her conservatively but as the situation was deteriorating and there was concern for increasing pain in her hip we perform some hip x-rays which demonstrated some mild degenerative changes in the hip joint but it was noted that she was evolving some weakness in the gastroc.  I scheduled her for surgery this coming Thursday however this morning the patient called noting that the pain had gotten considerably worse and she was not able to ambulate at all needing assistance to get to the bathroom and even do basic activities of daily living.  I advise she come to the hospital and she was planning on calling emergency medical services but her daughter intervened to bring her to the hospital with our scheduling surgery emergently to decompress the L2-3 and L5-S1 levels via laminotomies and foraminotomies.  Patient also has a significant lumbar scoliosis that accentuates the problem of the foraminal stenosis at the 2 levels involved L2-3 and L5-S1.  Past Medical History:  Diagnosis Date   Anginal pain (HCC) 1989   Anxiety    Arthritis    Atherosclerosis    Atrial fibrillation (HCC)    Coronary artery disease    Dysrhythmia    a-fib   GERD (gastroesophageal reflux disease)    Heart attack (HCC) 2020   Heart disease    High cholesterol    Hypertension     Past Surgical History:  Procedure Laterality Date   ABDOMINAL HYSTERECTOMY  1983   for endometriosis with Burch    ANGIOPLASTY     ANGIOPLASTY     BACK SURGERY     CARDIAC SURGERY     Catheterization   CORONARY STENT INTERVENTION N/A 07/09/2019   Procedure: CORONARY STENT INTERVENTION;  Surgeon: Yates Decamp, MD;  Location: [facility] INVASIVE CV LAB;  Service: Cardiovascular;  Laterality: N/A;   LEFT HEART CATH AND CORONARY ANGIOGRAPHY N/A 07/09/2019   Procedure: LEFT HEART CATH AND CORONARY ANGIOGRAPHY and possible intervention;  Surgeon: Yates Decamp, MD;  Location: [facility]VASIVE CV [facility];  Service: Cardiovascular;  Laterality: N/A;  4: or 4:30 PM today   NECK SURGERY     TOTAL KNEE ARTHROPLASTY Left 10/07/2020   Procedure: TOTAL KNEE ARTHROPLASTY;  Surgeon: Durene Romans, MD;  Location: [facility];  Service: Orthopedics;  Laterality: Left;  70 mins    Family History  Problem Relation Age of Onset   Uterine cancer Mother    Hypertension Brother    Heart disease Brother    Breast cancer Cousin    Social History:  reports that she has never smoked. She has never used smokeless tobacco. She reports current alcohol use of about 3.0 standard drinks per week. She reports that she does not use drugs.  Allergies:  Allergies  Allergen Reactions   Hydrocodone     Depressed   Repatha [Evolocumab] Diarrhea    Muscle ache    Medications Prior to Admission  Medication Sig Dispense Refill   acetaminophen (TYLENOL) 500  MG tablet Take 1,000 mg by mouth 4 (four) times daily as needed for mild pain or moderate pain.     Alirocumab (PRALUENT) 75 MG/ML SOAJ Inject 75 mg into the skin every 14 (fourteen) days. 2 mL 6   allopurinol (ZYLOPRIM) 100 MG tablet Take 100 mg by mouth daily as needed (Gout).     aspirin (ASPIRIN CHILDRENS) 81 MG chewable tablet Chew 1 tablet (81 mg total) by mouth daily.     BIOTIN PO Take 1 tablet by mouth at bedtime. Hair, skin & nails     Cyanocobalamin (VITAMIN B 12 PO) Take 1 tablet by mouth daily as needed (if thinks of it).     dextromethorphan-guaiFENesin (MUCINEX DM) 30-600 MG 12hr tablet Take 1  tablet by mouth daily as needed for cough.     docusate sodium (COLACE) 100 MG capsule Take 100 mg by mouth daily as needed for mild constipation.     doxycycline (VIBRAMYCIN) 100 MG capsule Take 1 capsule (100 mg total) by mouth 2 (two) times daily. 20 capsule 0   ezetimibe-simvastatin (VYTORIN) 10-40 MG tablet TAKE 1 TABLET BY MOUTH DAILY 90 tablet 1   gabapentin (NEURONTIN) 300 MG capsule Take 300 mg by mouth 3 (three) times daily as needed (Nerve pain).     isosorbide mononitrate (IMDUR) 60 MG 24 hr tablet TAKE 1 TABLET(60 MG) BY MOUTH DAILY 90 tablet 3   Lidocaine 4 % PTCH Apply 1 patch topically daily as needed (pain).     lisinopril (ZESTRIL) 5 MG tablet Take 1 tablet (5 mg total) by mouth daily. 90 tablet 3   methocarbamol (ROBAXIN) 500 MG tablet Take 1 tablet (500 mg total) by mouth every 6 (six) hours as needed for muscle spasms. (Patient taking differently: Take 500 mg by mouth every 8 (eight) hours as needed for muscle spasms.) 40 tablet 0   Multiple Vitamins-Minerals (MULTIVITAMIN ADULT PO) Take 1 tablet by mouth daily as needed (if thinks about it).     ondansetron (ZOFRAN) 4 MG tablet Take 4 mg by mouth every 8 (eight) hours as needed for nausea or vomiting.     Polyvinyl Alcohol-Povidone PF (REFRESH) 1.4-0.6 % SOLN Place 1 drop into both eyes daily as needed (Dry eyes).     predniSONE (DELTASONE) 10 MG tablet Take 2 tablets (20 mg total) by mouth daily. Take 2 tablets (20 mg total) by mouth daily for the first 7 days.  Take 1 tablet (10 mg total) by mouth daily for the remainder 5 days. (Patient taking differently: Take 10 mg by mouth daily. Take 2 tablets (20 mg total) by mouth daily for the first 7 days.  Take 1 tablet (10 mg total) by mouth daily for the remainder 5 days.) 20 tablet 0   saccharomyces boulardii (FLORASTOR) 250 MG capsule Take 1 capsule by mouth as needed (if thinks about it).     traMADol (ULTRAM) 50 MG tablet Take 1 tablet (50 mg total) by mouth every 6 (six) hours  as needed. (Patient taking differently: Take 50-100 mg by mouth every 8 (eight) hours as needed for moderate pain or severe pain.) 20 tablet 0   traZODone (DESYREL) 50 MG tablet Take 50-150 mg by mouth at bedtime.     Turmeric 500 MG CAPS Take 1 capsule by mouth daily as needed (if thinks about it).     ALPRAZolam (XANAX) 0.25 MG tablet Take 0.25 mg by mouth at bedtime as needed for sleep.     nitroGLYCERIN (NITROSTAT) 0.4 MG  SL tablet Place 1 tablet (0.4 mg total) under the tongue every 5 (five) minutes as needed for chest pain. 25 tablet 11    Results for orders placed or performed during the hospital encounter of 02/02/22 (from the past 48 hour(s))  Type and screen [facility]     Status: None (Preliminary result)   Collection Time: 02/02/22 11:05 AM  Result Value Ref Range   ABO/RH(D) PENDING    Antibody Screen PENDING    Sample Expiration      02/05/2022,2359 Performed at [facility], [address] [place], [place] [address]   CBC     Status: None   Collection Time: 02/02/22 11:30 AM  Result Value Ref Range   WBC 9.1 4.0 - 10.5 K/uL   RBC 4.62 3.87 - 5.11 MIL/uL   Hemoglobin 14.8 12.0 - 15.0 g/dL   HCT 35.4 56.2 - 56.3 %   MCV 95.2 80.0 - 100.0 fL   MCH 32.0 26.0 - 34.0 pg   MCHC 33.6 30.0 - 36.0 g/dL   RDW 89.3 73.4 - 28.7 %   Platelets 354 150 - 400 K/uL   nRBC 0.0 0.0 - 0.2 %    Comment: Performed at [facility], [address], [place], [place] [address]   No results found.  Review of Systems  Constitutional:  Positive for activity change.  Musculoskeletal:  Positive for back pain and gait problem.  Neurological:  Positive for weakness and numbness.  All other systems reviewed and are negative.  Blood pressure 136/72, pulse 74, temperature 97.6 F (36.4 C), temperature source Oral, resp. rate 20, height 5\' 2"  (1.575 m), weight 59 kg, SpO2 97 %. Physical Exam Constitutional:      Appearance: Normal appearance. She is normal weight.   HENT:     Head: Normocephalic and atraumatic.     Right Ear: Tympanic membrane normal.     Left Ear: Tympanic membrane normal.     Nose: Nose normal.     Mouth/Throat:     Mouth: Mucous membranes are moist.  Eyes:     Extraocular Movements: Extraocular movements intact.     Conjunctiva/sclera: Conjunctivae normal.     Pupils: Pupils are equal, round, and reactive to light.  Cardiovascular:     Rate and Rhythm: Normal rate and regular rhythm.     Pulses: Normal pulses.  Pulmonary:     Effort: Pulmonary effort is normal.     Breath sounds: Normal breath sounds.  Abdominal:     General: Abdomen is flat. Bowel sounds are normal.     Palpations: Abdomen is soft.  Musculoskeletal:     Cervical back: Normal range of motion and neck supple.     Comments: Positive straight leg raising on the left at 15 degrees Patrick's maneuver is negative.  Skin:    General: Skin is warm and dry.     Capillary Refill: Capillary refill takes less than 2 seconds.  Neurological:     Mental Status: She is alert.     Comments: Upper extremity strength and reflexes are intact cranial nerve examination is normal.  Lower extremity reveals marked weakness in the gastroc on the left side mild weakness in the iliopsoas on the left side graded at 3 out of 5.  Sensation is diminished throughout the entirety of the left lower extremity.  Absent reflexes in the patella and the Achilles on the left 1+ on the right.  Psychiatric:  Mood and Affect: Mood normal.        Behavior: Behavior normal.        Thought Content: Thought content normal.        Judgment: Judgment normal.     Assessment/Plan Spondylosis with left lumbar radiculopathy L2-3 and L5-S1, severe left S1 radiculopathy with marked weakness in the gastroc.  Degenerative scoliosis.  Plan: Emergent surgery for decompression of L2-3 and L5-S1 on the left.  Stefani Dama, MD 02/02/2022, 11:54 AM

## 2022-02-02 NOTE — Progress Notes (Signed)
Patient ID: Erica Frank, female   DOB: 03/06/1941, 81 y.o.   MRN: [id number] Vital signs are stable Incision is clean and dry Postoperatively patient has good relief of left leg pain.  Disc herniation found at time of surgery likely has a lot to do with her acutely worsening pain and weakness in the left leg.  Her motor strength appears improved also.

## 2022-02-02 NOTE — Transfer of Care (Signed)
Immediate Anesthesia Transfer of Care Note  Patient: Erica Frank  Procedure(s) Performed: Left Lumbar two-three, Lumbar five-Sacral one Sublaminar decompression (Left: Back)  Patient Location: PACU  Anesthesia Type:General  Level of Consciousness: awake, alert  and oriented  Airway & Oxygen Therapy: Patient Spontanous Breathing  Post-op Assessment: Report given to RN and Post -op Vital signs reviewed and stable  Post vital signs: Reviewed and stable  Last Vitals:  Vitals Value Taken Time  BP 96/57 02/02/22 1408  Temp    Pulse 63 02/02/22 1409  Resp 25 02/02/22 1410  SpO2 91 % 02/02/22 1409  Vitals shown include unvalidated device data.  Last Pain:  Vitals:   02/02/22 1208  TempSrc:   PainSc: 8          Complications: No notable events documented.

## 2022-02-03 ENCOUNTER — Encounter (HOSPITAL_COMMUNITY): Payer: Self-pay | Admitting: Neurological Surgery

## 2022-02-03 ENCOUNTER — Other Ambulatory Visit: Payer: Self-pay

## 2022-02-03 ENCOUNTER — Other Ambulatory Visit: Payer: Self-pay | Admitting: Pharmacist

## 2022-02-03 DIAGNOSIS — M48061 Spinal stenosis, lumbar region without neurogenic claudication: Secondary | ICD-10-CM | POA: Diagnosis not present

## 2022-02-03 DIAGNOSIS — I25118 Atherosclerotic heart disease of native coronary artery with other forms of angina pectoris: Secondary | ICD-10-CM

## 2022-02-03 DIAGNOSIS — E7800 Pure hypercholesterolemia, unspecified: Secondary | ICD-10-CM

## 2022-02-03 MED ORDER — PRALUENT 75 MG/ML ~~LOC~~ SOAJ: 75.0000 mg | SUBCUTANEOUS | 3 refills | Status: DC

## 2022-02-03 MED ORDER — TRAMADOL HCL 50 MG PO TABS
50.0000 mg | ORAL_TABLET | Freq: Four times a day (QID) | ORAL | 0 refills | Status: DC | PRN
Start: 2022-02-03 — End: 2022-04-06

## 2022-02-03 NOTE — Plan of Care (Signed)
Pt doing well. Pt given D/C instructions with verbal understanding. Rx's were sent to the pharmacy by MD. Pt's incision is clean and dry with no sign of infection. Pt's IV was removed prior to D/C. Pt D/C'd home via wheelchair oer MD order. Pt is stable @ D/C and has no other needs at this time. Holli Humbles, RN  ?

## 2022-02-03 NOTE — Discharge Instructions (Signed)

## 2022-02-03 NOTE — Evaluation (Signed)
Occupational Therapy Evaluation ?Patient Details ?Name: Erica Frank ?MRN: [id number] ?DOB: [birth year] ?Today's Date: 02/03/2022 ? ? ?History of Present Illness 81 y/o female admitted 02/02/22 following L lumbar laminotomy L2-3 and L5-S1 with decompression of L3 and S1 nerve roots. PMH: HTN, MI, Afib, CAD  ? ?Clinical Impression ?  ?Gerlene was evaluated s/p the above back sx, she is generally indep at baseline. Her daughter is an Therapist, sports and plans to assist for a few days at d/c but returns to work on Monday. Pt was set up for all sitting ADLs, min guard for all standing ADLs and min guard for functional ambulation with RW. She is limited by sx site pain, generalized weakness and L knee pain (replaced 1 year ago). Pt recalled great understanding of back precautions, compensatory techniques and AE use to adhere to precautions during functional tasks. Pt does nto required further OT acutely.Recommend d/c to home.  ?   ? ?Recommendations for follow up therapy are one component of a multi-disciplinary discharge planning process, led by the attending physician.  Recommendations may be updated based on patient status, additional functional criteria and insurance authorization.  ? ?Follow Up Recommendations ? No OT follow up  ?  ?Assistance Recommended at Discharge Frequent or constant Supervision/Assistance  ?Patient can return home with the following A little help with walking and/or transfers;A little help with bathing/dressing/bathroom;Assistance with cooking/housework;Assist for transportation;Help with stairs or ramp for entrance ? ?  ?Functional Status Assessment ? Patient has had a recent decline in their functional status and demonstrates the ability to make significant improvements in function in a reasonable and predictable amount of time.  ?Equipment Recommendations ? None recommended by OT (recommend AE: gave pt AE handout to purchase)  ?  ?Recommendations for Other Services   ? ? ?  ?Precautions / Restrictions  Precautions ?Precautions: Fall;Back ?Precaution Booklet Issued: Yes (comment) ?Required Braces or Orthoses:  (no brace needed) ?Restrictions ?Weight Bearing Restrictions: No  ? ?  ? ?Mobility Bed Mobility ?Overal bed mobility: Needs Assistance ?Bed Mobility: Rolling, Sidelying to Sit ?Rolling: Supervision ?Sidelying to sit: Supervision ?  ?  ?  ?General bed mobility comments: cues for log roll ?  ? ?Transfers ?Overall transfer level: Needs assistance ?Equipment used: Rolling walker (2 wheels) ?Transfers: Sit to/from Stand ?Sit to Stand: Min guard ?  ?  ?  ?  ?  ?General transfer comment: unsteady, especially with fatigue ?  ? ?  ?Balance Overall balance assessment: Needs assistance ?Sitting-balance support: Feet supported ?Sitting balance-Leahy Scale: Good ?  ?  ?Standing balance support: No upper extremity supported, During functional activity ?Standing balance-Leahy Scale: Fair ?  ?  ?  ?  ?  ?  ?  ?  ?  ?  ?  ?  ?   ? ?ADL either performed or assessed with clinical judgement  ? ?ADL Overall ADL's : Needs assistance/impaired ?  ?  ?  ?  ?  ?Functional mobility during ADLs: Min guard;Rolling walker (2 wheels) ?General ADL Comments: pt demonstrated set up ability to complete UB ADLs, and min guard for LB ADLs. she required cues throguhout to maintain back precautions and use compensatory techniques. pt internally distracted by pain. Benefits from RW for support and balance. Reviewed AE use, pt demonstrated great use of reacher and sockaide to increase independence for LB clothes  ? ? ? ?Vision Baseline Vision/History: 0 No visual deficits ?Ability to See in Adequate Light: 0 Adequate ?Patient Visual Report: No change from baseline ?Vision  Assessment?: No apparent visual deficits  ?   ? ?Pertinent Vitals/Pain Pain Assessment ?Pain Assessment: Faces ?Faces Pain Scale: Hurts even more ?Pain Location: back - incision site ?Pain Descriptors / Indicators: Operative site guarding, Grimacing ?Pain Intervention(s): Monitored  during session, Limited activity within patient's tolerance  ? ? ? ?Hand Dominance   ?  ?Extremity/Trunk Assessment Upper Extremity Assessment ?Upper Extremity Assessment: Generalized weakness ?  ?Lower Extremity Assessment ?Lower Extremity Assessment: Defer to PT evaluation ?  ?Cervical / Trunk Assessment ?Cervical / Trunk Assessment: Back Surgery ?  ?Communication Communication ?Communication: No difficulties ?  ?Cognition Arousal/Alertness: Awake/alert ?Behavior During Therapy: [facility] for tasks assessed/performed ?Overall Cognitive Status: Within Functional Limits for tasks assessed ?  ?  ?General Comments: recalled 3/3 back precautions ?  ?  ?General Comments  VSS on RA ? ?  ?   ?   ? ? ?Home Living Family/patient expects to be discharged to:: Private residence ?Living Arrangements: Children ?Available Help at Discharge: Family ?Type of Home: House ?Home Access: Ramped entrance ?  ?  ?Home Layout: One level ?  ?  ?Bathroom Shower/Tub: Walk-in shower ?  ?Bathroom Toilet: Handicapped height ?  ?  ?Home Equipment: Conservation officer, nature (2 wheels);Cane - single point;Crutches;BSC/3in1;Wheelchair - manual;Adaptive equipment ?Adaptive Equipment: Reacher ?Additional Comments: sleeps in recliner mainly ?  ? ?  ?Prior Functioning/Environment Prior Level of Function : Independent/Modified Independent;Driving ?  ?  ?  ?  ?  ?  ?Mobility Comments: no AD ?ADLs Comments: sleeps in recliner ?  ? ?  ?  ?OT Problem List: Decreased strength;Decreased range of motion;Decreased activity tolerance;Impaired balance (sitting and/or standing);Decreased safety awareness;Decreased knowledge of use of DME or AE;Decreased knowledge of precautions;Pain ?  ?   ?   ?OT Goals(Current goals can be found in the care plan section) Acute Rehab OT Goals ?Patient Stated Goal: less pain ?OT Goal Formulation: With patient ?Time For Goal Achievement: 02/03/22  ? ?AM-PAC OT "6 Clicks" Daily Activity     ?Outcome Measure Help from another person eating meals?:  None ?Help from another person taking care of personal grooming?: A Little ?Help from another person toileting, which includes using toliet, bedpan, or urinal?: A Little ?Help from another person bathing (including washing, rinsing, drying)?: A Little ?Help from another person to put on and taking off regular upper body clothing?: None ?Help from another person to put on and taking off regular lower body clothing?: A Little ?6 Click Score: 20 ?  ?End of Session Equipment Utilized During Treatment: Rolling walker (2 wheels) ?Nurse Communication: Mobility status ? ?Activity Tolerance: Patient tolerated treatment well ?Patient left: in bed;with call bell/phone within reach ? ?OT Visit Diagnosis: Unsteadiness on feet (R26.81);Other abnormalities of gait and mobility (R26.89);Pain  ?              ?Time: [phone] ?OT Time Calculation (min): 22 min ?Charges:  OT General Charges ?$OT Visit: 1 Visit ?OT Evaluation ?$OT Eval Moderate Complexity: 1 Mod ? ? ?Genine Beckett A Christie Copley ?02/03/2022, 10:16 AM ?

## 2022-02-03 NOTE — Discharge Summary (Signed)
Physician Discharge Summary  ?Patient ID: ?Erica Frank ?MRN: [id number] ?DOB/AGE: 1941/05/31 81 y.o. ? ?Admit date: 02/02/2022 ?Discharge date: 02/03/2022 ? ?Admission Diagnoses: Lumbar radiculopathy left L2-3 L5-S1 ? ?Discharge Diagnoses: Lumbar radiculopathy left L2-3 L5-S1.  Herniated nucleus pulposus L5-S1 left ?Principal Problem: ?  Degenerative lumbar spinal stenosis ? ? ?Discharged Condition: good ? ?Hospital Course: Patient was admitted to undergo surgical decompression at L2-3 and L5-S1.  She is found to have a lumbar herniated nucleus pulposus at L5-S1 on the left.  She did remarkably well postoperatively. ? ?Consults: None ? ?Significant Diagnostic Studies: None ? ?Treatments: surgery: See op note ? ?Discharge Exam: ?Blood pressure (!) 99/59, pulse 65, temperature 98.4 ?F (36.9 ?C), temperature source Oral, resp. rate 16, height 5\' 2"  (1.575 m), weight 59 kg, SpO2 96 %. ?Incision clean and dry motor function intact ? ?Disposition: Discharge disposition: 01-Home or Self Care ? ? ? ? ? ? ?Discharge Instructions   ? ? Call MD for:  redness, tenderness, or signs of infection (pain, swelling, redness, odor or green/yellow discharge around incision site)   Complete by: As directed ?  ? Call MD for:  severe uncontrolled pain   Complete by: As directed ?  ? Call MD for:  temperature >100.4   Complete by: As directed ?  ? Diet - low sodium heart healthy   Complete by: As directed ?  ? Discharge wound care:   Complete by: As directed ?  ? Okay to shower. Do not apply salves or appointments to incision. No heavy lifting with the upper extremities greater than 10 pounds. May resume driving when not requiring pain medication and patient feels comfortable with doing so.  ? Incentive spirometry RT   Complete by: As directed ?  ? Increase activity slowly   Complete by: As directed ?  ? ?  ? ?Allergies as of 02/03/2022   ? ?   Reactions  ? Hydrocodone   ? Depressed  ? Repatha [evolocumab] Diarrhea  ? Muscle ache  ? ?  ? ?   ?Medication List  ?  ? ?TAKE these medications   ? ?acetaminophen 500 MG tablet ?Commonly known as: TYLENOL ?Take 1,000 mg by mouth 4 (four) times daily as needed for mild pain or moderate pain. ?  ?allopurinol 100 MG tablet ?Commonly known as: ZYLOPRIM ?Take 100 mg by mouth daily as needed (Gout). ?  ?ALPRAZolam 0.25 MG tablet ?Commonly known as: 04/05/2022 ?Take 0.25 mg by mouth at bedtime as needed for sleep. ?  ?aspirin 81 MG chewable tablet ?Commonly known as: Aspirin Childrens ?Chew 1 tablet (81 mg total) by mouth daily. ?  ?BIOTIN PO ?Take 1 tablet by mouth at bedtime. Hair, skin & nails ?  ?dextromethorphan-guaiFENesin 30-600 MG 12hr tablet ?Commonly known as: MUCINEX DM ?Take 1 tablet by mouth daily as needed for cough. ?  ?docusate sodium 100 MG capsule ?Commonly known as: COLACE ?Take 100 mg by mouth daily as needed for mild constipation. ?  ?doxycycline 100 MG capsule ?Commonly known as: VIBRAMYCIN ?Take 1 capsule (100 mg total) by mouth 2 (two) times daily. ?  ?ezetimibe-simvastatin 10-40 MG tablet ?Commonly known as: VYTORIN ?TAKE 1 TABLET BY MOUTH DAILY ?  ?gabapentin 300 MG capsule ?Commonly known as: NEURONTIN ?Take 300 mg by mouth 3 (three) times daily as needed (Nerve pain). ?  ?isosorbide mononitrate 60 MG 24 hr tablet ?Commonly known as: IMDUR ?TAKE 1 TABLET(60 MG) BY MOUTH DAILY ?  ?Lidocaine 4 % Ptch ?Apply 1 patch  topically daily as needed (pain). ?  ?lisinopril 5 MG tablet ?Commonly known as: ZESTRIL ?Take 1 tablet (5 mg total) by mouth daily. ?  ?methocarbamol 500 MG tablet ?Commonly known as: Robaxin ?Take 1 tablet (500 mg total) by mouth every 6 (six) hours as needed for muscle spasms. ?What changed: when to take this ?  ?MULTIVITAMIN ADULT PO ?Take 1 tablet by mouth daily as needed (if thinks about it). ?  ?nitroGLYCERIN 0.4 MG SL tablet ?Commonly known as: NITROSTAT ?Place 1 tablet (0.4 mg total) under the tongue every 5 (five) minutes as needed for chest pain. ?  ?ondansetron 4 MG  tablet ?Commonly known as: ZOFRAN ?Take 4 mg by mouth every 8 (eight) hours as needed for nausea or vomiting. ?  ?Praluent 75 MG/ML Soaj ?Generic drug: Alirocumab ?Inject 75 mg into the skin every 14 (fourteen) days. ?  ?predniSONE 10 MG tablet ?Commonly known as: DELTASONE ?Take 2 tablets (20 mg total) by mouth daily. Take 2 tablets (20 mg total) by mouth daily for the first 7 days.  Take 1 tablet (10 mg total) by mouth daily for the remainder 5 days. ?What changed: how much to take ?  ?Refresh 1.4-0.6 % Soln ?Generic drug: Polyvinyl Alcohol-Povidone PF ?Place 1 drop into both eyes daily as needed (Dry eyes). ?  ?saccharomyces boulardii 250 MG capsule ?Commonly known as: FLORASTOR ?Take 1 capsule by mouth as needed (if thinks about it). ?  ?traMADol 50 MG tablet ?Commonly known as: ULTRAM ?Take 1 tablet (50 mg total) by mouth every 6 (six) hours as needed for moderate pain or severe pain. ?What changed: reasons to take this ?  ?traZODone 50 MG tablet ?Commonly known as: DESYREL ?Take 50-150 mg by mouth at bedtime. ?  ?Turmeric 500 MG Caps ?Take 1 capsule by mouth daily as needed (if thinks about it). ?  ?VITAMIN B 12 PO ?Take 1 tablet by mouth daily as needed (if thinks of it). ?  ? ?  ? ?  ?  ? ? ?  ?Discharge Care Instructions  ?(From admission, onward)  ?  ? ? ?  ? ?  Start     Ordered  ? 02/03/22 0000  Discharge wound care:       ?Comments: Okay to shower. Do not apply salves or appointments to incision. No heavy lifting with the upper extremities greater than 10 pounds. May resume driving when not requiring pain medication and patient feels comfortable with doing so.  ? 02/03/22 0929  ? ?  ?  ? ?  ? ? ? ?Signed: ?Stefani Dama ?02/03/2022, 9:30 AM ? ? ?

## 2022-02-03 NOTE — Evaluation (Signed)
Physical Therapy Evaluation & Discharge ?Patient Details ?Name: Erica Frank ?MRN: [id number] ?DOB: [birth year] ?Today's Date: 02/03/2022 ? ?History of Present Illness ? 81 y/o female admitted 02/02/22 following L lumbar laminotomy L2-3 and L5-S1 with decompression of L3 and S1 nerve roots. PMH: HTN, MI, Afib, CAD  ?Clinical Impression ? Patient admitted following above procedure. Patient functioning at modI for mobility with use of RW for comfort. Educated patient on back precautions and progressive walking program, patient verbalized understanding. Encouraged patient in the next 2-3 days to set RW aside to assist with return to independence. No further skilled PT identified acutely. No PT follow up recommended at this time.   ?   ? ?Recommendations for follow up therapy are one component of a multi-disciplinary discharge planning process, led by the attending physician.  Recommendations may be updated based on patient status, additional functional criteria and insurance authorization. ? ?Follow Up Recommendations No PT follow up ? ?  ?Assistance Recommended at Discharge PRN  ?Patient can return home with the following ?   ? ?  ?Equipment Recommendations None recommended by PT  ?Recommendations for Other Services ?    ?  ?Functional Status Assessment Patient has had a recent decline in their functional status and demonstrates the ability to make significant improvements in function in a reasonable and predictable amount of time.  ? ?  ?Precautions / Restrictions Precautions ?Precautions: Fall;Back ?Precaution Booklet Issued: Yes (comment) ?Required Braces or Orthoses:  (no brace needed) ?Restrictions ?Weight Bearing Restrictions: No  ? ?  ? ?Mobility ? Bed Mobility ?Overal bed mobility: Modified Independent ?  ?  ?  ?  ?  ?  ?  ?  ? ?Transfers ?Overall transfer level: Modified independent ?Equipment used: Rolling Georgenia Salim (2 wheels) ?  ?  ?  ?  ?  ?  ?  ?  ?  ? ?Ambulation/Gait ?Ambulation/Gait assistance: Modified  independent (Device/Increase time) ?Gait Distance (Feet): 200 Feet ?Assistive device: Rolling Maecyn Panning (2 wheels) ?Gait Pattern/deviations: Step-through pattern, Decreased stride length, Knee flexed in stance - left ?Gait velocity: decreased ?  ?  ?General Gait Details: mild limping on L due to hx of L TKA with restricted ROM ? ?Stairs ?  ?  ?  ?  ?  ? ?Wheelchair Mobility ?  ? ?Modified Rankin (Stroke Patients Only) ?  ? ?  ? ?Balance Overall balance assessment: No apparent balance deficits (not formally assessed) (reliant on RW for support) ?  ?  ?  ?  ?  ?  ?  ?  ?  ?  ?  ?  ?  ?  ?  ?  ?  ?  ?   ? ? ? ?Pertinent Vitals/Pain Pain Assessment ?Pain Assessment: Faces ?Faces Pain Scale: Hurts even more ?Pain Location: back - incision site ?Pain Descriptors / Indicators: Operative site guarding, Grimacing ?Pain Intervention(s): Monitored during session, Repositioned, Patient requesting pain meds-RN notified  ? ? ?Home Living Family/patient expects to be discharged to:: Private residence ?Living Arrangements: Children ?Available Help at Discharge: Family ?Type of Home: House ?Home Access: Ramped entrance ?  ?  ?  ?Home Layout: One level ?Home Equipment: Conservation officer, nature (2 wheels);Cane - single point;Crutches;BSC/3in1;Wheelchair - manual;Adaptive equipment ?Additional Comments: sleeps in recliner mainly  ?  ?Prior Function Prior Level of Function : Independent/Modified Independent;Driving ?  ?  ?  ?  ?  ?  ?  ?  ?  ? ? ?Hand Dominance  ?   ? ?  ?Extremity/Trunk  Assessment  ? Upper Extremity Assessment ?Upper Extremity Assessment: Defer to OT evaluation ?  ? ?Lower Extremity Assessment ?Lower Extremity Assessment: Generalized weakness (hx of L TKA with restricted ROM - lacking full extension) ?  ? ?Cervical / Trunk Assessment ?Cervical / Trunk Assessment: Back Surgery  ?Communication  ? Communication: No difficulties  ?Cognition Arousal/Alertness: Awake/alert ?Behavior During Therapy: [facility] for tasks  assessed/performed ?Overall Cognitive Status: Within Functional Limits for tasks assessed ?  ?  ?  ?  ?  ?  ?  ?  ?  ?  ?  ?  ?  ?  ?  ?  ?  ?  ?  ? ?  ?General Comments   ? ?  ?Exercises    ? ?Assessment/Plan  ?  ?PT Assessment Patient does not need any further PT services  ?PT Problem List   ? ?   ?  ?PT Treatment Interventions     ? ?PT Goals (Current goals can be found in the Care Plan section)  ?Acute Rehab PT Goals ?Patient Stated Goal: to go home ?PT Goal Formulation: All assessment and education complete, DC therapy ? ?  ?Frequency   ?  ? ? ?Co-evaluation   ?  ?  ?  ?  ? ? ?  ?AM-PAC PT "6 Clicks" Mobility  ?Outcome Measure Help needed turning from your back to your side while in a flat bed without using bedrails?: None ?Help needed moving from lying on your back to sitting on the side of a flat bed without using bedrails?: None ?Help needed moving to and from a bed to a chair (including a wheelchair)?: None ?Help needed standing up from a chair using your arms (e.g., wheelchair or bedside chair)?: None ?Help needed to walk in hospital room?: None ?Help needed climbing 3-5 steps with a railing? : None ?6 Click Score: 24 ? ?  ?End of Session   ?Activity Tolerance: Patient tolerated treatment well ?Patient left: in bed;with call bell/phone within reach ?Nurse Communication: Mobility status ?PT Visit Diagnosis: Unsteadiness on feet (R26.81);Muscle weakness (generalized) (M62.81) ?  ? ?Time: [id number] ?PT Time Calculation (min) (ACUTE ONLY): 18 min ? ? ?Charges:   PT Evaluation ?$PT Eval Low Complexity: 1 Low ?  ?  ?   ? ? ?Jahmarion Popoff A. Gilford Rile, PT, DPT ?Acute Rehabilitation Services ?Pager [phone] ?Office [phone] ? ? ?Jadaya Sommerfield A Deavion Dobbs ?02/03/2022, 8:30 AM ? ?

## 2022-02-03 NOTE — TOC Transition Note (Signed)
Transition of Care (TOC) - CM/SW Discharge Note ? ? ?Patient Details  ?Name: Erica Frank ?MRN: [id number] ?Date of Birth: September 17, 1941 ? ?Transition of Care (TOC) CM/SW Contact:  ?Lawerance Sabal, RN ?Phone Number: ?02/03/2022, 8:40 AM ? ? ?Clinical Narrative:    ?No HH needs identified. 3C staff will provide any DME needed.  ? ? ? ?  ?  ? ? ?Patient Goals and CMS Choice ?  ?  ?  ? ?Discharge Placement ?  ?           ?  ?  ?  ?  ? ?Discharge Plan and Services ?  ?  ?           ?  ?  ?  ?  ?  ?  ?  ?  ?  ?  ? ?Social Determinants of Health (SDOH) Interventions ?  ? ? ?Readmission Risk Interventions ?No flowsheet data found. ? ? ? ? ?

## 2022-02-03 NOTE — Telephone Encounter (Signed)
Called and spoke to pt, pt voiced understanding.

## 2022-02-18 ENCOUNTER — Other Ambulatory Visit: Payer: Self-pay

## 2022-02-18 ENCOUNTER — Other Ambulatory Visit (HOSPITAL_COMMUNITY): Payer: Self-pay | Admitting: Internal Medicine

## 2022-02-18 ENCOUNTER — Ambulatory Visit (HOSPITAL_COMMUNITY)
Admission: RE | Admit: 2022-02-18 | Discharge: 2022-02-18 | Disposition: A | Discharge status: Home / Self Care | Payer: Medicare Other | Source: Ambulatory Visit | Attending: Internal Medicine | Admitting: Internal Medicine

## 2022-02-18 DIAGNOSIS — M7989 Other specified soft tissue disorders: Secondary | ICD-10-CM | POA: Diagnosis not present

## 2022-02-18 DIAGNOSIS — M79662 Pain in left lower leg: Secondary | ICD-10-CM | POA: Diagnosis not present

## 2022-02-24 ENCOUNTER — Telehealth: Payer: Self-pay | Admitting: Orthopaedic Surgery

## 2022-02-24 NOTE — Telephone Encounter (Signed)
Pt called. She was scheduled for 4/5 to come back in to discuss surgery further with Dr. [name] ?

## 2022-02-24 NOTE — Telephone Encounter (Signed)
Please call the pt, she was up most of the night and called the on call provider- she wants an appt today  ?

## 2022-02-26 ENCOUNTER — Other Ambulatory Visit: Payer: Self-pay | Admitting: Cardiology

## 2022-02-26 DIAGNOSIS — I10 Essential (primary) hypertension: Secondary | ICD-10-CM

## 2022-03-05 DIAGNOSIS — M65332 Trigger finger, left middle finger: Secondary | ICD-10-CM | POA: Insufficient documentation

## 2022-03-10 ENCOUNTER — Ambulatory Visit: Payer: Medicare Other | Admitting: Orthopaedic Surgery

## 2022-03-10 ENCOUNTER — Encounter: Payer: Self-pay | Admitting: Orthopaedic Surgery

## 2022-03-10 DIAGNOSIS — T8484XD Pain due to internal orthopedic prosthetic devices, implants and grafts, subsequent encounter: Secondary | ICD-10-CM | POA: Diagnosis not present

## 2022-03-10 DIAGNOSIS — M659 Synovitis and tenosynovitis, unspecified: Secondary | ICD-10-CM | POA: Diagnosis not present

## 2022-03-10 DIAGNOSIS — M25562 Pain in left knee: Secondary | ICD-10-CM | POA: Diagnosis not present

## 2022-03-10 DIAGNOSIS — Z96652 Presence of left artificial knee joint: Secondary | ICD-10-CM

## 2022-03-10 DIAGNOSIS — G8929 Other chronic pain: Secondary | ICD-10-CM

## 2022-03-10 NOTE — Progress Notes (Signed)
The patient is an 81 year old female well-known to me.  She had a left total knee arthroplasty on October 07, 2020 by one of my colleagues in town.  She has had severe pain in that knee since surgery.  She has tried and failed all forms conservative treatment.  I have seen her on multiple occasions.  There is no evidence of prosthetic loosening on imaging studies.  She feels like the knee is unstable for her.  It wakes her up at night.  We have tried steroid injections as well as activity modification and time.  She is taking anti-inflammatories.  She is been to physical therapy again for the knee. ? ?Examination of her left knee today shows full range of motion of the knee.  There is no effusion and no warmth to the knee.  There is no evidence of infection.  The knee feels stable on exam. ? ?I had a long thorough discussion with her and family today about her knee.  She understands that any type of revision surgery cannot guarantee that this would control her pain.  I feel like we have exhausted all conservative treatment measures as does she.  I recommend an open arthrotomy of her left knee with a synovectomy and at minimum changing out her polyliner and potentially even upsize the poly since she does feel the knee is unstable.  There is always potential for revision of all components.  I had a long and thorough discussion with her about the surgery and what it involves and understanding that our goal is trying to decrease her pain and improve her mobility and quality of life with her no guarantees.  There is always a heightened risk of infection going back in the knee as well as blood clots and bone loss.  All question concerns were answered and addressed.  We will work on getting this scheduled. ?

## 2022-03-16 ENCOUNTER — Other Ambulatory Visit: Payer: Self-pay | Admitting: Cardiology

## 2022-03-21 ENCOUNTER — Encounter: Payer: Self-pay | Admitting: Cardiology

## 2022-03-22 ENCOUNTER — Telehealth: Payer: Self-pay | Admitting: Orthopaedic Surgery

## 2022-03-22 NOTE — Telephone Encounter (Signed)
Pt is calling back to find out about Surgery  ?

## 2022-03-23 NOTE — Telephone Encounter (Signed)
I called patient and scheduled surgery. 

## 2022-04-02 ENCOUNTER — Encounter: Payer: Self-pay | Admitting: Orthopaedic Surgery

## 2022-04-06 ENCOUNTER — Ambulatory Visit: Payer: Medicare Other | Admitting: Obstetrics and Gynecology

## 2022-04-06 ENCOUNTER — Encounter: Payer: Self-pay | Admitting: Obstetrics and Gynecology

## 2022-04-06 VITALS — BP 124/78 | HR 73 | Ht 60.0 in | Wt 128.0 lb

## 2022-04-06 DIAGNOSIS — N3281 Overactive bladder: Secondary | ICD-10-CM | POA: Diagnosis not present

## 2022-04-06 DIAGNOSIS — N393 Stress incontinence (female) (male): Secondary | ICD-10-CM | POA: Diagnosis not present

## 2022-04-06 DIAGNOSIS — R350 Frequency of micturition: Secondary | ICD-10-CM

## 2022-04-06 NOTE — Progress Notes (Signed)
[name] ?New Patient Evaluation and Consultation ? ?Referring Provider: Tamela Gammon, NP ?PCP: Prince Solian, MD ?Date of Service: 04/06/2022 ? ?SUBJECTIVE ?Chief Complaint: New Patient (Initial Visit) Erica Frank is a 81 y.o. female here for a consult on urinary urgency. /) ? ?History of Present Illness: Erica Frank is a 81 y.o. White or Caucasian female seen in consultation at the request of NP Marny Lowenstein for evaluation of incontinence.   ? ?Review of records from NP [facility] significant for: ?She has leakage with cough and sneeze. Also has a problem getting to the bathroom in time at night.  ? ?Urinary Symptoms: ?Leaks urine with cough/ sneeze, laughing, exercise, lifting, and with urgency. UUI> SUI ?Leaks 2-3 time(s) per day.  ?Pad use: 2 pads per day.   ?She is bothered by her UI symptoms. ? ?Day time voids 3-4.  Nocturia: 2-3 times per night to void. ?Voiding dysfunction: she empties her bladder well.  ?does not use a catheter to empty bladder.  ?When urinating, she feels a weak stream ?Drinks: 1 cup decaf coffee, 1 glass decaf tea, not a lot of water  ? ?UTIs: 1 UTI's in the last year.   ?Denies history of blood in urine and kidney or bladder stones ? ?Pelvic Organ Prolapse Symptoms:                  ?She Denies a feeling of a bulge the vaginal area.  ? ?Bowel Symptom: ?Bowel movements: 1 time(s) per day ?Stool consistency: soft  ?Straining: yes.  ?Splinting: no.  ?Incomplete evacuation: no.  ?She Denies accidental bowel leakage / fecal incontinence ?Bowel regimen: stool softener ?Last colonoscopy: Date 2016, Results- neg ? ?Sexual Function ?Sexually active: no.  ? ?Pelvic Pain ?Admits to pelvic pain ?Location: vaginal area ?Pain occurs: at night ?Prior pain treatment: n/a ?Improved by: walking ?Worsened by: reclining ? ? ?Past Medical History:  ?Past Medical History:  ?Diagnosis Date  ? Anginal pain ([place]) 1989  ? Anxiety   ? Arthritis   ? Atherosclerosis   ? Atrial  fibrillation ([place])   ? Coronary artery disease   ? Dysrhythmia   ? a-fib  ? GERD (gastroesophageal reflux disease)   ? Heart attack ([place]) 2020  ? Heart disease   ? High cholesterol   ? Hypertension   ? ? ? ?Past Surgical History:   ?Past Surgical History:  ?Procedure Laterality Date  ? ABDOMINAL HYSTERECTOMY  1983  ? for endometriosis with Burch  ? ANGIOPLASTY    ? ANGIOPLASTY    ? BACK SURGERY    ? CARDIAC SURGERY    ? Catheterization  ? CORONARY STENT INTERVENTION N/A 07/09/2019  ? Procedure: CORONARY STENT INTERVENTION;  Surgeon: Adrian Prows, MD;  Location: [place] [facility];  Service: Cardiovascular;  Laterality: N/A;  ? LEFT HEART CATH AND CORONARY ANGIOGRAPHY N/A 07/09/2019  ? Procedure: LEFT HEART CATH AND CORONARY ANGIOGRAPHY and possible intervention;  Surgeon: Adrian Prows, MD;  Location: [place] [facility];  Service: Cardiovascular;  Laterality: N/A;  4: or 4:30 PM today  ? LUMBAR LAMINECTOMY/DECOMPRESSION MICRODISCECTOMY Left 02/02/2022  ? Procedure: Left Lumbar two-three, Lumbar five-Sacral one Sublaminar decompression;  Surgeon: Kristeen Miss, MD;  Location: [place];  Service: Neurosurgery;  Laterality: Left;  ? NECK SURGERY    ? TOTAL KNEE ARTHROPLASTY Left 10/07/2020  ? Procedure: TOTAL KNEE ARTHROPLASTY;  Surgeon: Paralee Cancel, MD;  Location: [facility];  Service: Orthopedics;  Laterality: Left;  70 mins  ? ? ? ?  Past OB/GYN History: ?OB History  ?Gravida Para Term Preterm AB Living  ?1 1       1   ?SAB IAB Ectopic Multiple Live Births  ?           ?  ?# Outcome Date GA Lbr Len/2nd Weight Sex Delivery Anes PTL Lv  ?1 Para           ? ? ?Vaginal deliveries: 1,  Forceps/ Vacuum deliveries: 0, Cesarean section: 0 ?S/p hysterectomy ? ? ?Medications: She has a current medication list which includes the following prescription(s): acetaminophen, praluent, allopurinol, alprazolam, aspirin, biotin, cyanocobalamin, docusate sodium, doxycycline, ezetimibe-simvastatin, isosorbide mononitrate, lisinopril, methocarbamol,  multiple vitamin, nitroglycerin, ondansetron, refresh, and saccharomyces boulardii.  ? ?Allergies: Patient is allergic to hydrocodone and repatha [evolocumab].  ? ?Social History:  ?Social History  ? ?Tobacco Use  ? Smoking status: Never  ? Smokeless tobacco: Never  ?Vaping Use  ? Vaping Use: Never used  ?Substance Use Topics  ? Alcohol use: Yes  ?  Alcohol/week: 3.0 standard drinks  ?  Types: 3 Glasses of wine per week  ?  Comment: Occasional  ? Drug use: No  ? ? ?Relationship status: widowed ?She lives with daughter.   ?She is not employed. ?Regular exercise: Yes: mostly prior to knee surgery ?History of abuse: No ? ?Family History:   ?Family History  ?Problem Relation Age of Onset  ? Uterine cancer Mother   ? Hypertension Brother   ? Heart disease Brother   ? Breast cancer Cousin   ? ? ? ?Review of Systems: Review of Systems  ?Constitutional:  Positive for malaise/fatigue. Negative for fever and weight loss.  ?Respiratory:  Negative for cough, shortness of breath and wheezing.   ?Cardiovascular:  Negative for chest pain, palpitations and leg swelling.  ?Gastrointestinal:  Negative for abdominal pain and blood in stool.  ?Genitourinary:  Negative for dysuria.  ?Musculoskeletal:  Positive for myalgias.  ?Skin:  Negative for rash.  ?Neurological:  Negative for dizziness and headaches.  ?Endo/Heme/Allergies:  Bruises/bleeds easily.  ?Psychiatric/Behavioral:  Negative for depression. The patient is not nervous/anxious.   ? ? ?OBJECTIVE ?Physical Exam: ?Vitals:  ? 04/06/22 1421  ?BP: 124/78  ?Pulse: 73  ?Weight: 128 lb (58.1 kg)  ?Height: 5' (1.524 m)  ? ? ?Physical Exam ?Constitutional:   ?   General: She is not in acute distress. ?Pulmonary:  ?   Effort: Pulmonary effort is normal.  ?Abdominal:  ?   General: There is no distension.  ?   Palpations: Abdomen is soft.  ?   Tenderness: There is no abdominal tenderness. There is no rebound.  ?Musculoskeletal:     ?   General: No swelling. Normal range of motion.  ?Skin: ?    General: Skin is warm and dry.  ?   Findings: No rash.  ?Neurological:  ?   Mental Status: She is alert and oriented to person, place, and time.  ?Psychiatric:     ?   Mood and Affect: Mood normal.     ?   Behavior: Behavior normal.  ? ? ? ?GU / Detailed Urogynecologic Evaluation:  ?Pelvic Exam: Normal external female genitalia; Bartholin's and Skene's glands normal in appearance; urethral meatus normal in appearance, no urethral masses or discharge.  ? ?CST: negative ? ?s/p hysterectomy: Speculum exam reveals normal vaginal mucosa with  atrophy and normal vaginal cuff.  Adnexa no mass, fullness, tenderness.   ? ? ?Pelvic floor strength I/V ? ?Pelvic floor musculature: Right levator  non-tender, Right obturator non-tender, Left levator non-tender, Left obturator non-tender ? ?POP-Q:  ? ?POP-Q ? ?-3  ?                                          Aa   ?-3 ?                                          Ba  ?-7  ?                                            C  ? ?2  ?                                          Gh  ?4  ?                                          Pb  ?7  ?                                          tvl  ? ?-2  ?                                          Ap  ?-2  ?                                          Bp  ?   ?                                            D  ? ? ? ?Rectal Exam:  ?Normal external rectum ? ?Post-Void Residual (PVR) by Bladder Scan: ?In order to evaluate bladder emptying, we discussed obtaining a postvoid residual and she agreed to this procedure. ? ?Procedure: The ultrasound unit was placed on the patient's abdomen in the suprapubic region after the patient had voided. A PVR of 12 ml was obtained by bladder scan. ? ?Laboratory Results: ?Unable to provide urine sample ? ? ?ASSESSMENT AND PLAN ?Ms. Henderson is a 81 y.o. with:  ?1. Overactive bladder   ?2. Urinary frequency   ?3. SUI (stress urinary incontinence, female)   ? ?OAB ?- We discussed the symptoms of overactive bladder (OAB), which include urinary  urgency, urinary frequency, nocturia, with or without urge incontinence.  While we do not know the exact etiology of OAB, several treatment options exist. We discussed management including behavioral therap

## 2022-04-06 NOTE — Patient Instructions (Signed)

## 2022-04-19 ENCOUNTER — Ambulatory Visit: Payer: Medicare Other | Admitting: Orthopaedic Surgery

## 2022-04-29 ENCOUNTER — Encounter: Payer: Self-pay | Admitting: Physical Therapy

## 2022-04-29 ENCOUNTER — Ambulatory Visit: Payer: Medicare Other | Attending: Obstetrics and Gynecology | Admitting: Physical Therapy

## 2022-04-29 DIAGNOSIS — R279 Unspecified lack of coordination: Secondary | ICD-10-CM | POA: Insufficient documentation

## 2022-04-29 DIAGNOSIS — R293 Abnormal posture: Secondary | ICD-10-CM | POA: Diagnosis not present

## 2022-04-29 DIAGNOSIS — M6281 Muscle weakness (generalized): Secondary | ICD-10-CM | POA: Diagnosis present

## 2022-04-29 DIAGNOSIS — N3281 Overactive bladder: Secondary | ICD-10-CM | POA: Diagnosis not present

## 2022-04-29 DIAGNOSIS — N393 Stress incontinence (female) (male): Secondary | ICD-10-CM | POA: Diagnosis not present

## 2022-04-29 NOTE — Patient Instructions (Signed)

## 2022-04-29 NOTE — Therapy (Signed)
OUTPATIENT PHYSICAL THERAPY FEMALE PELVIC EVALUATION   Patient Name: Erica Frank MRN: [id number] DOB:Oct 28, 1941, 81 y.o., female Today's Date: 04/29/2022    Past Medical History:  Diagnosis Date   Anginal pain ([place]) 1989   Anxiety    Arthritis    Atherosclerosis    Atrial fibrillation ([place])    Coronary artery disease    Dysrhythmia    a-fib   GERD (gastroesophageal reflux disease)    Heart attack ([place]) 2020   Heart disease    High cholesterol    Hypertension    Past Surgical History:  Procedure Laterality Date   ABDOMINAL HYSTERECTOMY  1983   for endometriosis with Burch   ANGIOPLASTY     ANGIOPLASTY     BACK SURGERY     CARDIAC SURGERY     Catheterization   CORONARY STENT INTERVENTION N/A 07/09/2019   Procedure: CORONARY STENT INTERVENTION;  Surgeon: Adrian Prows, MD;  Location: [place] [facility];  Service: Cardiovascular;  Laterality: N/A;   LEFT HEART CATH AND CORONARY ANGIOGRAPHY N/A 07/09/2019   Procedure: LEFT HEART CATH AND CORONARY ANGIOGRAPHY and possible intervention;  Surgeon: Adrian Prows, MD;  Location: [place] [facility];  Service: Cardiovascular;  Laterality: N/A;  4: or 4:30 PM today   LUMBAR LAMINECTOMY/DECOMPRESSION MICRODISCECTOMY Left 02/02/2022   Procedure: Left Lumbar two-three, Lumbar five-Sacral one Sublaminar decompression;  Surgeon: Kristeen Miss, MD;  Location: [place];  Service: Neurosurgery;  Laterality: Left;   NECK SURGERY     TOTAL KNEE ARTHROPLASTY Left 10/07/2020   Procedure: TOTAL KNEE ARTHROPLASTY;  Surgeon: Paralee Cancel, MD;  [facility];  Service: Orthopedics;  Laterality: Left;  70 mins   Patient Active Problem List   Diagnosis Date Noted   Degenerative lumbar spinal stenosis 02/02/2022   S/P left TKA 10/07/2020   Left knee OA 10/07/2020   Syncope 01/16/2020   Anemia 01/16/2020   Thrombocytopenia ([place]) 01/16/2020   CAD (coronary artery disease) 01/16/2020   Elevated troponin 07/09/2019   NSTEMI (non-ST elevated myocardial  infarction) ([place]) 07/09/2019   Paroxysmal atrial fibrillation ([place]) 02/15/2019   Mixed hyperlipidemia 02/15/2019   Bradycardia 02/15/2019   Coronary artery disease involving native coronary artery of native heart with unstable angina pectoris ([place]) 12/20/2018   Dyslipidemia 12/20/2018   Essential hypertension 12/20/2018    PCP: Prince Solian, MD  REFERRING PROVIDER: Jaquita Folds, MD  REFERRING DIAG: N32.81 (ICD-10-CM) - Overactive bladder N39.3 (ICD-10-CM) - SUI (stress urinary incontinence, female)  THERAPY DIAG:  Muscle weakness (generalized)  Abnormal posture  Unspecified lack of coordination  Rationale for Evaluation and Treatment Rehabilitation  ONSET DATE: several years  SUBJECTIVE:  SUBJECTIVE STATEMENT: Pt reports urinary leakage with every trip to restroom and can't get there quickly enough.  Fluid intake: Yes: 1-2 bottles of water per day; one cup of decaf coffee but other than that no other fluids most of the time. Sometimes tea but that's it.     Patient confirms identification and approves PT to assess pelvic floor and treatment Yes   PAIN:  Are you having pain? No   PRECAUTIONS: None  WEIGHT BEARING RESTRICTIONS No  FALLS:  Has patient fallen in last 6 months? Yes. Number of falls 1 - tripped over the table status post knee replacement   LIVING ENVIRONMENT: Lives with: lives with their family Lives in: House/apartment   OCCUPATION: retired   PLOF: Independent  PATIENT GOALS to have less leakage.   PERTINENT HISTORY:  Multiple previous back surgeries , 2021 Lt TKA with severe pain since this in Lt knee, hysterectomy  Sexual abuse: No  BOWEL MOVEMENT Pain with bowel movement: No sometimes has stomach cramping Type of bowel movement:Type ([place]  Stool Scale) 4, Frequency once per day, and Strain Yes sometimes but only because she is on tramadol for pain and forgets stool softer  Fully empty rectum: Yes:   Leakage: No Pads: No Fiber supplement: Yes: has one but forgets to take it.   URINATION Pain with urination: No Fully empty bladder: Yes:   Stream: Strong Urgency: Yes:   Frequency: 4x  per day and 1-2x per night to urinate  Leakage: Urge to void, Walking to the bathroom, Coughing, Sneezing, and Laughing Pads: Yes: 1x daily  not at night  INTERCOURSE Pain with intercourse:  not active   PREGNANCY Vaginal deliveries 1 Tearing Yes: she thinks so but can't remember  C-section deliveries 0 Currently pregnant No  PROLAPSE None but does report sometimes having pressure at vaginal opening but very rarely and walking makes it go away.     OBJECTIVE:   DIAGNOSTIC FINDINGS:    COGNITION:  Overall cognitive status: Within functional limits for tasks assessed     SENSATION:  Light touch: Appears intact  Proprioception: Appears intact  MUSCLE LENGTH: Bil hamstrings and adductors limited by 50%   FUNCTIONAL TESTS:  Unable to complete full functional squat due to Lt knee restrictions and poor mechanics  GAIT: Distance walked: 300' Assistive device utilized: None Level of assistance: Modified independence Comments: decreased stride length, limited Lt knee extension during stance phase, decreased step height at Lt, decreased cadence and decreased trunk and arm mobility with gait  POSTURE:  Rounded shoulders, posterior pelvic tilt, mild kyphosis   LUMBARAROM/PROM  A/PROM A/PROM  eval  Flexion Limited by 25%  Extension Limited by 25%  Right lateral flexion Limited by 75%  Left lateral flexion Limited by 75%  Right rotation Limited by 75%  Left rotation Limited by 75%   (Blank rows = not tested)  LOWER EXTREMITY ROM:  WFL RT, Lt knee functional range but with discomfort from pt, all other joints  WFL  LOWER EXTREMITY MMT:  Bil hips 4/5; Rt knee and bil ankles 5/5, Lt knee 3/5 with pain   PELVIC MMT: deferred    MMT eval  Vaginal   Internal Anal Sphincter   External Anal Sphincter   Puborectalis   Diastasis Recti   (Blank rows = not tested)        PALPATION:   General  mild discomfort with abdominal palpation but denied pain, mild restrictions noted over mid lower quadrants  External Perineal Exam deferred at pt request                             Internal Pelvic Floor deferred   TONE: deferred  PROLAPSE: Deferred   TODAY'S TREATMENT  04/29/2022 EVAL Examination completed, findings reviewed, pt educated on POC, HEP, and bladder irritants, urge drill. Pt motivated to participate in PT and agreeable to attempt recommendations.    PATIENT EDUCATION:  Education details: [id number] Person educated: Patient Education method: Explanation, Demonstration, Tactile cues, Verbal cues, and Handouts Education comprehension: verbalized understanding and returned demonstration   HOME EXERCISE PROGRAM: [id number]  ASSESSMENT:  CLINICAL IMPRESSION: Patient is a 81 y.o. female  who was seen today for physical therapy evaluation and treatment for urinary incontinence. Pt reports at least a 20 year history of some kind of urinary problems, worsening over time to currently urinary leakage several times a day in small amounts and frequently while en route to restroom but also with stressors of lifting, sneezing/laughing/coughing. Pt found to have decreased mobility in spine in all directions, decreased flexibility in bil hips and decreased strength in bil hips, limited gait mechanics with pain and mobility deficits in Lt knee, and mild fascial restrictions at abdomen. Pt requested to defer internal assessment at this time and would like to attempt HEP first as MD just tested this. As per chart review, Dr. Wannetta Sender documented 1/5 strength at pelvic floor. Pt given handouts for  bladder irritants, urge drill, and HEP to initiate pelvic floor strengthening. Pt denied additional questions at end of session, all handouts discussed. Pt would benefit from additional PT to further address deficits.     OBJECTIVE IMPAIRMENTS decreased coordination, decreased endurance, decreased mobility, difficulty walking, decreased strength, increased fascial restrictions, impaired flexibility, improper body mechanics, postural dysfunction, and pain.   ACTIVITY LIMITATIONS lifting, squatting, continence, and locomotion level  PARTICIPATION LIMITATIONS: cleaning and community activity  PERSONAL FACTORS Age, Past/current experiences, Time since onset of injury/illness/exacerbation, and 1-2 comorbidities: multiple mobility related deficits   are also affecting patient's functional outcome.   REHAB POTENTIAL: Good  CLINICAL DECISION MAKING: Stable/uncomplicated  EVALUATION COMPLEXITY: Low   GOALS: Goals reviewed with patient? Yes  SHORT TERM GOALS: Target date: 05/27/2022  Pt to be I with HEP.  Baseline: Goal status: INITIAL  2.  Pt to demonstrate at least 2/5 pelvic floor strength for improved pelvic stability and decreased strain at pelvic floor/ decrease leakage.  Baseline:  Goal status: INITIAL    LONG TERM GOALS: Target date:  07/30/22    Pt to be I with advanced HEP.  Baseline:  Goal status: INITIAL  2.  Pt to demonstrate at least 3/5 pelvic floor strength for improved pelvic stability and decreased strain at pelvic floor/ decrease leakage.  Baseline:  Goal status: INITIAL  3.  Pt to demonstrate improved coordination of pelvic floor and breathing mechanics with strengthening exercise to decrease strain at pelvic floor for leakage.  Baseline:  Goal status: INITIAL  4.  Pt to demonstrate improved gait mechanics with or without assistive device for 50' for improved efficiency to restroom for decreased leakage.  Baseline:  Goal status: INITIAL  5.  Pt to be I with  recall of voiding mechanics and breathing mechanics to improve bowel and bladder habits.  Baseline:  Goal status: INITIAL    PLAN: PT FREQUENCY: 1x/week  PT DURATION:  10 sessions  PLANNED INTERVENTIONS: Therapeutic exercises, Therapeutic activity, Neuromuscular re-education, Balance training,  Gait training, Patient/Family education, Joint mobilization, DME instructions, Aquatic Therapy, Dry Needling, Spinal mobilization, Cryotherapy, Moist heat, Manual lymph drainage, scar mobilization, Taping, Biofeedback, and Manual therapy  PLAN FOR NEXT SESSION: strengthening and coordination of pelvic floor and breathing mechanics, internal if appropriate and pt consents. Review handouts as needed   Stacy Gardner, PT, DPT 05/25/234:15 PM

## 2022-05-05 ENCOUNTER — Other Ambulatory Visit: Payer: Self-pay | Admitting: Physician Assistant

## 2022-05-05 DIAGNOSIS — Z96652 Presence of left artificial knee joint: Secondary | ICD-10-CM

## 2022-05-05 NOTE — Progress Notes (Signed)
Surgical Instructions    Your procedure is scheduled on Thursday, May 13, 2022 at 8:45 AM.  Report to [facility] Main Entrance "A" at 6:45 A.M., then check in with the Admitting office.  Call this number if you have problems the morning of surgery:  [phone]   If you have any questions prior to your surgery date call [phone]: Open Monday-Friday 8am-4pm    Remember:  Do not eat after midnight the night before your surgery  You may drink clear liquids until 5:45 AM the morning of your surgery.   Clear liquids allowed are: Water, Non-Citrus Juices (without pulp), Carbonated Beverages, Clear Tea, Black Coffee Only (NO MILK, CREAM OR POWDERED CREAMER of any kind), and Gatorade.    Take these medicines the morning of surgery with A SIP OF WATER:  doxycycline (VIBRAMYCIN) ezetimibe-simvastatin (VYTORIN) isosorbide mononitrate (IMDUR)  IF NEEDED: acetaminophen (TYLENOL) allopurinol (ZYLOPRIM) methocarbamol (ROBAXIN) nitroGLYCERIN (NITROSTAT) Polyvinyl Alcohol-Povidone PF (REFRESH) ondansetron (ZOFRAN)   As of today, STOP taking any Aspirin (unless otherwise instructed by your surgeon) Aleve, Naproxen, Ibuprofen, Motrin, Advil, Goody's, BC's, all herbal medications, fish oil, and all vitamins.                     Do NOT Smoke (Tobacco/Vaping) for 24 hours prior to your procedure.  If you use a CPAP at night, you may bring your mask/headgear for your overnight stay.   Contacts, glasses, piercing's, hearing aid's, dentures or partials may not be worn into surgery, please bring cases for these belongings.    For patients admitted to the hospital, discharge time will be determined by your treatment team.   Patients discharged the day of surgery will not be allowed to drive home, and someone needs to stay with them for 24 hours.  SURGICAL WAITING ROOM VISITATION Patients having surgery or a procedure may have two support people in the waiting area. Visitors may stay in the  waiting area during the procedure and switch out with other visitors if needed. Children under the age of 32 must have an adult accompany them who is not the patient. If the patient needs to stay at the hospital during part of their recovery, the visitor guidelines for inpatient rooms apply.  Please refer to the [facility] website for the visitor guidelines for Inpatients (after your surgery is over and you are in a regular room).    Special instructions:   [facility]- Preparing For Surgery  Before surgery, you can play an important role. Because skin is not sterile, your skin needs to be as free of germs as possible. You can reduce the number of germs on your skin by washing with CHG (chlorahexidine gluconate) Soap before surgery.  CHG is an antiseptic cleaner which kills germs and bonds with the skin to continue killing germs even after washing.    Oral Hygiene is also important to reduce your risk of infection.  Remember - BRUSH YOUR TEETH THE MORNING OF SURGERY WITH YOUR REGULAR TOOTHPASTE  Please do not use if you have an allergy to CHG or antibacterial soaps. If your skin becomes reddened/irritated stop using the CHG.  Do not shave (including legs and underarms) for at least 48 hours prior to first CHG shower. It is OK to shave your face.  Please follow these instructions carefully.   Shower the NIGHT BEFORE SURGERY and the MORNING OF SURGERY  If you chose to wash your hair, wash your hair first as usual with your normal shampoo.  After you shampoo, rinse your hair and body thoroughly to remove the shampoo.  Use CHG Soap as you would any other liquid soap. You can apply CHG directly to the skin and wash gently with a scrungie or a clean washcloth.   Apply the CHG Soap to your body ONLY FROM THE NECK DOWN.  Do not use on open wounds or open sores. Avoid contact with your eyes, ears, mouth and genitals (private parts). Wash Face and genitals (private parts)  with your normal soap.    Wash thoroughly, paying special attention to the area where your surgery will be performed.  Thoroughly rinse your body with warm water from the neck down.  DO NOT shower/wash with your normal soap after using and rinsing off the CHG Soap.  Pat yourself dry with a CLEAN TOWEL.  Wear CLEAN PAJAMAS to bed the night before surgery  Place CLEAN SHEETS on your bed the night before your surgery  DO NOT SLEEP WITH PETS.   Day of Surgery: Take a shower with CHG soap. Do not wear jewelry or makeup Do not wear lotions, powders, perfumes, or deodorant. Do not shave 48 hours prior to surgery. Do not bring valuables to the hospital.  [facility] is not responsible for any belongings or valuables. Do not wear nail polish, gel polish, artificial nails, or any other type of covering on natural nails (fingers and toes) If you have artificial nails or gel coating that need to be removed by a nail salon, please have this removed prior to surgery. Artificial nails or gel coating may interfere with anesthesia's ability to adequately monitor your vital signs. Wear Clean/Comfortable clothing the morning of surgery Do not apply any deodorants/lotions.   Remember to brush your teeth WITH YOUR REGULAR TOOTHPASTE.   Please read over the following fact sheets that you were given.  If you received a COVID test during your pre-op visit  it is requested that you wear a mask when out in public, stay away from anyone that may not be feeling well and notify your surgeon if you develop symptoms. If you have been in contact with anyone that has tested positive in the last 10 days please notify you surgeon.

## 2022-05-06 ENCOUNTER — Ambulatory Visit: Payer: Medicare Other

## 2022-05-06 ENCOUNTER — Encounter (HOSPITAL_COMMUNITY)
Admission: RE | Admit: 2022-05-06 | Discharge: 2022-05-06 | Disposition: A | Discharge status: Home / Self Care | Payer: Medicare Other | Source: Ambulatory Visit | Attending: Orthopaedic Surgery | Admitting: Orthopaedic Surgery

## 2022-05-06 ENCOUNTER — Other Ambulatory Visit: Payer: Self-pay

## 2022-05-06 ENCOUNTER — Encounter (HOSPITAL_COMMUNITY): Payer: Self-pay

## 2022-05-06 VITALS — BP 123/71 | HR 62 | Temp 98.1°F | Resp 18 | Ht 60.0 in | Wt 128.0 lb

## 2022-05-06 DIAGNOSIS — Z96652 Presence of left artificial knee joint: Secondary | ICD-10-CM | POA: Diagnosis not present

## 2022-05-06 DIAGNOSIS — Z01812 Encounter for preprocedural laboratory examination: Secondary | ICD-10-CM | POA: Diagnosis present

## 2022-05-06 DIAGNOSIS — R748 Abnormal levels of other serum enzymes: Secondary | ICD-10-CM | POA: Insufficient documentation

## 2022-05-06 DIAGNOSIS — T8484XD Pain due to internal orthopedic prosthetic devices, implants and grafts, subsequent encounter: Secondary | ICD-10-CM | POA: Insufficient documentation

## 2022-05-06 DIAGNOSIS — I6523 Occlusion and stenosis of bilateral carotid arteries: Secondary | ICD-10-CM

## 2022-05-06 DIAGNOSIS — Z01818 Encounter for other preprocedural examination: Secondary | ICD-10-CM

## 2022-05-06 HISTORY — DX: Abnormal levels of other serum enzymes: R74.8

## 2022-05-06 LAB — CBC
HCT: 42.9 % (ref 36.0–46.0)
Hemoglobin: 13.7 g/dL (ref 12.0–15.0)
MCH: 31.7 pg (ref 26.0–34.0)
MCHC: 31.9 g/dL (ref 30.0–36.0)
MCV: 99.3 fL (ref 80.0–100.0)
Platelets: 255 10*3/uL (ref 150–400)
RBC: 4.32 MIL/uL (ref 3.87–5.11)
RDW: 12.7 % (ref 11.5–15.5)
WBC: 7.4 10*3/uL (ref 4.0–10.5)
nRBC: 0 % (ref 0.0–0.2)

## 2022-05-06 LAB — COMPREHENSIVE METABOLIC PANEL
ALT: 25 U/L (ref 0–44)
AST: 28 U/L (ref 15–41)
Albumin: 3.7 g/dL (ref 3.5–5.0)
Alkaline Phosphatase: 66 U/L (ref 38–126)
Anion gap: 8 (ref 5–15)
BUN: 17 mg/dL (ref 8–23)
CO2: 26 mmol/L (ref 22–32)
Calcium: 8.9 mg/dL (ref 8.9–10.3)
Chloride: 105 mmol/L (ref 98–111)
Creatinine, Ser: 0.8 mg/dL (ref 0.44–1.00)
GFR, Estimated: >60 mL/min (ref >60)
Glucose, Bld: 97 mg/dL (ref 70–99)
Potassium: 4.5 mmol/L (ref 3.5–5.1)
Sodium: 139 mmol/L (ref 135–145)
Total Bilirubin: 0.3 mg/dL (ref 0.3–1.2)
Total Protein: 6.3 g/dL — ABNORMAL LOW (ref 6.5–8.1)

## 2022-05-06 LAB — TYPE AND SCREEN
ABO/RH(D): O POS
Antibody Screen: NEGATIVE

## 2022-05-06 LAB — SURGICAL PCR SCREEN
MRSA, PCR: NEGATIVE
Staphylococcus aureus: NEGATIVE

## 2022-05-06 NOTE — Progress Notes (Addendum)
PCP - Chilton Greathouse, MD Cardiologist - Dr Yates Decamp- pt states she is scheduled to see MD June 19 (follows up Q6 months) pt denies any cardiac symptoms or recent cardiac issues.   PPM/ICD - denies Chest x-ray -  EKG - 12/02/21 Stress Test - 02/26/19 ECHO - 04/23/19 Cardiac Cath - 07/09/19  Carotid Duplex- 05/06/2022 - pt reports she had this done today, and has this done every 6 months d/t "blockages".   Sleep Study - denies  Aspirin Instructions: Follow your surgeon's instructions on when to stop Aspirin.  If no instructions were given by your surgeon then you will need to call the office to get those instructions.    ERAS Protcol - ERAS + ensure order- given to pt at PAT appointment  COVID TEST- N/A   Anesthesia review: heart history- hx of stent in past. Pt denies any recent cardiac symptoms. Does pt need cardiac clearance prior to surgery? Pt reports she is not scheduled to see Dr. Jacinto Halim prior to surgery.   Patient denies shortness of breath, fever, cough and chest pain at PAT appointment   All instructions explained to the patient, with a verbal understanding of the material. Patient agrees to go over the instructions while at home for a better understanding. Patient also instructed to self quarantine after being tested for COVID-19. The opportunity to ask questions was provided.

## 2022-05-06 NOTE — Progress Notes (Addendum)
Surgical Instructions    Your procedure is scheduled on Thursday, May 13, 2022 at 8:45 AM.  Report to [facility] Main Entrance "A" at 6:45 A.M., then check in with the Admitting office.  Call this number if you have problems the morning of surgery:  [phone]   If you have any questions prior to your surgery date call [phone]: Open Monday-Friday 8am-4pm    Remember:  Do not eat after midnight the night before your surgery  You may drink clear liquids until 5:45 AM the morning of your surgery.   Clear liquids allowed are: Water, Non-Citrus Juices (without pulp), Carbonated Beverages, Clear Tea, Black Coffee Only (NO MILK, CREAM OR POWDERED CREAMER of any kind), and Gatorade.  Patient Instructions  The night before surgery:  No food after midnight. ONLY clear liquids after midnight  The day of surgery (if you do NOT have diabetes):  Drink ONE (1) Pre-Surgery Clear Ensure by 5:45AM the morning of surgery. Drink in one sitting. Do not sip.  This drink was given to you during your hospital  pre-op appointment visit.  Nothing else to drink after completing the  Pre-Surgery Clear Ensure.      Take these medicines the morning of surgery with A SIP OF WATER: Duloxetine (Cymbalta) isosorbide mononitrate (IMDUR)  IF NEEDED: acetaminophen (TYLENOL) Tramadol (ultram) allopurinol (ZYLOPRIM) methocarbamol (ROBAXIN) nitroGLYCERIN (NITROSTAT)- please call and speak to pre-op nurse if you have taken this Polyvinyl Alcohol-Povidone PF (REFRESH) ondansetron (ZOFRAN)   Follow your surgeon's instructions on when to stop Aspirin.  If no instructions were given by your surgeon then you will need to call the office to get those instructions.    As of today, STOP taking any Aleve, Naproxen, Ibuprofen, Motrin, Advil, Goody's, BC's, all herbal medications, fish oil, and all vitamins.                     Do NOT Smoke (Tobacco/Vaping) for 24 hours prior to your procedure.  If you use a  CPAP at night, you may bring your mask/headgear for your overnight stay.   Contacts, glasses, piercing's, hearing aid's, dentures or partials may not be worn into surgery, please bring cases for these belongings.    For patients admitted to the hospital, discharge time will be determined by your treatment team.   Patients discharged the day of surgery will not be allowed to drive home, and someone needs to stay with them for 24 hours.  SURGICAL WAITING ROOM VISITATION Patients having surgery or a procedure may have two support people in the waiting area. Visitors may stay in the waiting area during the procedure and switch out with other visitors if needed. Children under the age of 80 must have an adult accompany them who is not the patient. If the patient needs to stay at the hospital during part of their recovery, the visitor guidelines for inpatient rooms apply.  Please refer to the [facility] website for the visitor guidelines for Inpatients (after your surgery is over and you are in a regular room).    Special instructions:   [place]- Preparing For Surgery  Before surgery, you can play an important role. Because skin is not sterile, your skin needs to be as free of germs as possible. You can reduce the number of germs on your skin by washing with CHG (chlorahexidine gluconate) Soap before surgery.  CHG is an antiseptic cleaner which kills germs and bonds with the skin to continue killing germs even after washing.  Oral Hygiene is also important to reduce your risk of infection.  Remember - BRUSH YOUR TEETH THE MORNING OF SURGERY WITH YOUR REGULAR TOOTHPASTE  Please do not use if you have an allergy to CHG or antibacterial soaps. If your skin becomes reddened/irritated stop using the CHG.  Do not shave (including legs and underarms) for at least 48 hours prior to first CHG shower. It is OK to shave your face.  Please follow these instructions carefully.   Shower the NIGHT  BEFORE SURGERY and the MORNING OF SURGERY  If you chose to wash your hair, wash your hair first as usual with your normal shampoo.  After you shampoo, rinse your hair and body thoroughly to remove the shampoo.  Use CHG Soap as you would any other liquid soap. You can apply CHG directly to the skin and wash gently with a scrungie or a clean washcloth.   Apply the CHG Soap to your body ONLY FROM THE NECK DOWN.  Do not use on open wounds or open sores. Avoid contact with your eyes, ears, mouth and genitals (private parts). Wash Face and genitals (private parts)  with your normal soap.   Wash thoroughly, paying special attention to the area where your surgery will be performed.  Thoroughly rinse your body with warm water from the neck down.  DO NOT shower/wash with your normal soap after using and rinsing off the CHG Soap.  Pat yourself dry with a CLEAN TOWEL.  Wear CLEAN PAJAMAS to bed the night before surgery  Place CLEAN SHEETS on your bed the night before your surgery  DO NOT SLEEP WITH PETS.   Day of Surgery: Take a shower with CHG soap. Do not wear jewelry or makeup Do not wear lotions, powders, perfumes, or deodorant. Do not shave 48 hours prior to surgery. Do not bring valuables to the hospital.  [facility] is not responsible for any belongings or valuables. Do not wear nail polish, gel polish, artificial nails, or any other type of covering on natural nails (fingers and toes) If you have artificial nails or gel coating that need to be removed by a nail salon, please have this removed prior to surgery. Artificial nails or gel coating may interfere with anesthesia's ability to adequately monitor your vital signs. Wear Clean/Comfortable clothing the morning of surgery Do not apply any deodorants/lotions.   Remember to brush your teeth WITH YOUR REGULAR TOOTHPASTE.   Please read over the following fact sheets that you were given.  If you received a COVID test during your  pre-op visit  it is requested that you wear a mask when out in public, stay away from anyone that may not be feeling well and notify your surgeon if you develop symptoms. If you have been in contact with anyone that has tested positive in the last 10 days please notify you surgeon.

## 2022-05-10 NOTE — Progress Notes (Signed)
Anesthesia Chart Review:  Follows with cardiologist Dr. Einar Gip for history of HTN, HLD, carotid stenosis, asymptomatic chronic sinus bradycardia, paroxysmal A-fib s/p cardioversion 2020, CAD treated with balloon angioplasty 1989 in 1990, NSTEMI on 07/09/2019 treated with stenting of the mid LAD.  Last seen 11/12/2021, noted to be doing well at that time from cardiac standpoint. Cardiac clearance per letter 05/12/22 states, "Erica Frank is at low risk, from a cardiac standpoint, for her upcoming procedure: Left Knee revision.  It is ok to proceed without further cardiac testing. If applicable can hold Eliquis for 2  day(s) prior to procedure and re-start 2-5  days post procedure."  Recently underwent L2-3 and L5-S1 lumbar decompression on 5/46/5681 without complication.  Preop labs reviewed, unremarkable.  EKG 11/12/2021: Sinus bradycardia.  Rate 55.  LAD.  Carotid artery duplex 05/06/2022:  Duplex suggests stenosis in the right internal carotid artery (16-49%).  Duplex suggests stenosis in the right external carotid artery (<50%).  Duplex suggests stenosis in the left internal carotid artery (50-69%),  upper end of the spectrum. Duplex suggests stenosis in the left external  carotid artery (<50%).  Antegrade right vertebral artery flow. Antegrade left vertebral artery  flow.  Compared to the study done on 11/05/2021, no significant change. Follow up  in six months is appropriate if clinically indicated.  Cath and PCI 07/09/2019: Moderate amount of diffuse coronary calcification noted in all coronary vessels including left main.  Right coronary artery and ramus intermediate showed mid 30% stenosis.  Mid LAD had a focal moderately calcified high-grade 95% stenosis S/P  STENT SYNERGY DES 3X24. Maximum pressure: 16 atm. Inflation time: 75 sec. Stent strut is well apposed.  There was residual 0% stenosis with maintenance of TIMI-3 TIMI-3 flow.   Recommendation: Patient will be discharged home in the  morning if she remains stable.  ASA and plavix for 4 weeks then Plavix only along with Eliquis. I have loaded her with Brilinta for now. 120 mm contrast utilized.  Echocardiogram 04/23/2019:  Normal LV systolic function with EF 59%. Left ventricle cavity is normal  in size. Normal global wall motion. Calculated EF 59%.  Moderate (Grade II) mitral regurgitation. E-wave dominant mitral inflow.  Mild tricuspid regurgitation.  No evidence of pulmonary hypertension.     [name] [facility] Short Stay Center/Anesthesiology Phone [phone] 05/12/2022 10:23 AM

## 2022-05-12 ENCOUNTER — Encounter: Payer: Self-pay | Admitting: Cardiology

## 2022-05-12 NOTE — Anesthesia Preprocedure Evaluation (Addendum)
Anesthesia Evaluation  Patient identified by MRN, date of birth, ID band Patient awake    Reviewed: Allergy & Precautions, NPO status , Patient's Chart, lab work & pertinent test results  Airway Mallampati: II  TM Distance: >3 FB     Dental   Pulmonary    breath sounds clear to auscultation       Cardiovascular hypertension, + angina + CAD and + Past MI  + dysrhythmias  Rhythm:Regular Rate:Normal     Neuro/Psych negative neurological ROS     GI/Hepatic Neg liver ROS, GERD  ,  Endo/Other  negative endocrine ROS  Renal/GU negative Renal ROS     Musculoskeletal   Abdominal   Peds  Hematology   Anesthesia Other Findings   Reproductive/Obstetrics                            Anesthesia Physical Anesthesia Plan  ASA: 3  Anesthesia Plan: General   Post-op Pain Management:    Induction: Intravenous  PONV Risk Score and Plan: Ondansetron and Treatment may vary due to age or medical condition  Airway Management Planned: LMA and Oral ETT  Additional Equipment:   Intra-op Plan:   Post-operative Plan:   Informed Consent: I have reviewed the patients History and Physical, chart, labs and discussed the procedure including the risks, benefits and alternatives for the proposed anesthesia with the patient or authorized representative who has indicated his/her understanding and acceptance.     Dental advisory given  Plan Discussed with:   Anesthesia Plan Comments: (PAT note by Karoline Caldwell, PA-C: Follows with cardiologist Dr. Einar Gip for history of HTN, HLD, carotid stenosis, asymptomatic chronic sinus bradycardia, paroxysmal A-fib s/p cardioversion 2020, CAD treated with balloon angioplasty 1989 in 1990, NSTEMI on 07/09/2019 treated with stenting of the mid LAD.  Last seen 11/12/2021, noted to be doing well at that time from cardiac standpoint. Cardiac clearance per letter 05/12/22 states, "Arlice Colt at low risk, from a cardiac standpoint, for herupcoming procedure: Left Knee revision. It is ok to proceed without further cardiac testing. If applicable can hold Eliquisfor 2day(s) prior to procedure and re-start 2-5 days post procedure."  Recently underwent L2-3 and L5-S1 lumbar decompression on 12/14/3233 without complication.  Preop labs reviewed, unremarkable.  EKG 11/12/2021: Sinus bradycardia.  Rate 55.  LAD.  Carotid artery duplex 05/06/2022:  Duplex suggests stenosis in the right internal carotid artery (16-49%).  Duplex suggests stenosis in the right external carotid artery (<50%).  Duplex suggests stenosis in the left internal carotid artery (50-69%),  upper end of the spectrum. Duplex suggests stenosis in the left external  carotid artery (<50%).  Antegrade right vertebral artery flow. Antegrade left vertebral artery  flow.  Compared to the study done on 11/05/2021, no significant change. Follow up  in six months is appropriate if clinically indicated.  Cath and PCI 07/09/2019: Moderate amount of diffuse coronary calcification noted in all coronary vessels including left main. Right coronary artery and ramus intermediate showed mid 30% stenosis. Mid LAD had a focal moderately calcified high-grade 95% stenosis S/P STENT SYNERGY DES 3X24. Maximum pressure: 16 atm. Inflation time: 75 sec. Stent strut is well apposed. There was residual 0% stenosis with maintenance of TIMI-3 TIMI-3 flow.  Recommendation:Patient will be discharged home in the morning if she remains stable. ASA and plavix for 4 weeks then Plavix only along with Eliquis. I have loaded her with Brilinta for now. 120 mm contrast utilized.  Echocardiogram 04/23/2019:  Normal LV systolic function with EF 59%. Left ventricle cavity is normal  in size. Normal global wall motion. Calculated EF 59%.  Moderate (Grade II) mitral regurgitation. E-wave dominant mitral inflow.  Mild tricuspid regurgitation.  No  evidence of pulmonary hypertension.   )       Anesthesia Quick Evaluation

## 2022-05-13 ENCOUNTER — Inpatient Hospital Stay (HOSPITAL_COMMUNITY)
Admission: RE | Admit: 2022-05-13 | Discharge: 2022-05-17 | DRG: 489 | Disposition: A | Discharge status: Home Health | Payer: Medicare Other | Source: Ambulatory Visit | Attending: Orthopaedic Surgery | Admitting: Orthopaedic Surgery

## 2022-05-13 ENCOUNTER — Other Ambulatory Visit: Payer: Self-pay

## 2022-05-13 ENCOUNTER — Encounter (HOSPITAL_COMMUNITY)
Admission: RE | Disposition: A | Discharge status: Home Health | Payer: Self-pay | Source: Ambulatory Visit | Attending: Orthopaedic Surgery

## 2022-05-13 ENCOUNTER — Inpatient Hospital Stay (HOSPITAL_COMMUNITY): Payer: Medicare Other | Admitting: Anesthesiology

## 2022-05-13 ENCOUNTER — Encounter (HOSPITAL_COMMUNITY): Payer: Self-pay | Admitting: Orthopaedic Surgery

## 2022-05-13 ENCOUNTER — Other Ambulatory Visit: Payer: Medicare Other

## 2022-05-13 ENCOUNTER — Inpatient Hospital Stay (HOSPITAL_COMMUNITY): Payer: Medicare Other | Admitting: Emergency Medicine

## 2022-05-13 DIAGNOSIS — Z7982 Long term (current) use of aspirin: Secondary | ICD-10-CM | POA: Diagnosis not present

## 2022-05-13 DIAGNOSIS — Z79899 Other long term (current) drug therapy: Secondary | ICD-10-CM | POA: Diagnosis not present

## 2022-05-13 DIAGNOSIS — T8484XA Pain due to internal orthopedic prosthetic devices, implants and grafts, initial encounter: Secondary | ICD-10-CM

## 2022-05-13 DIAGNOSIS — I252 Old myocardial infarction: Secondary | ICD-10-CM

## 2022-05-13 DIAGNOSIS — I10 Essential (primary) hypertension: Secondary | ICD-10-CM | POA: Diagnosis present

## 2022-05-13 DIAGNOSIS — E7800 Pure hypercholesterolemia, unspecified: Secondary | ICD-10-CM | POA: Diagnosis present

## 2022-05-13 DIAGNOSIS — M659 Synovitis and tenosynovitis, unspecified: Secondary | ICD-10-CM

## 2022-05-13 DIAGNOSIS — M65862 Other synovitis and tenosynovitis, left lower leg: Secondary | ICD-10-CM | POA: Diagnosis present

## 2022-05-13 DIAGNOSIS — T8484XD Pain due to internal orthopedic prosthetic devices, implants and grafts, subsequent encounter: Secondary | ICD-10-CM | POA: Diagnosis not present

## 2022-05-13 DIAGNOSIS — K219 Gastro-esophageal reflux disease without esophagitis: Secondary | ICD-10-CM | POA: Diagnosis present

## 2022-05-13 DIAGNOSIS — I4891 Unspecified atrial fibrillation: Secondary | ICD-10-CM | POA: Diagnosis present

## 2022-05-13 DIAGNOSIS — Z96652 Presence of left artificial knee joint: Secondary | ICD-10-CM | POA: Diagnosis not present

## 2022-05-13 DIAGNOSIS — Y831 Surgical operation with implant of artificial internal device as the cause of abnormal reaction of the patient, or of later complication, without mention of misadventure at the time of the procedure: Secondary | ICD-10-CM | POA: Diagnosis present

## 2022-05-13 DIAGNOSIS — Z8249 Family history of ischemic heart disease and other diseases of the circulatory system: Secondary | ICD-10-CM | POA: Diagnosis not present

## 2022-05-13 DIAGNOSIS — Z885 Allergy status to narcotic agent status: Secondary | ICD-10-CM

## 2022-05-13 DIAGNOSIS — G8929 Other chronic pain: Secondary | ICD-10-CM | POA: Diagnosis present

## 2022-05-13 DIAGNOSIS — Z955 Presence of coronary angioplasty implant and graft: Secondary | ICD-10-CM

## 2022-05-13 DIAGNOSIS — I2510 Atherosclerotic heart disease of native coronary artery without angina pectoris: Secondary | ICD-10-CM | POA: Diagnosis present

## 2022-05-13 DIAGNOSIS — F419 Anxiety disorder, unspecified: Secondary | ICD-10-CM | POA: Diagnosis present

## 2022-05-13 DIAGNOSIS — Z888 Allergy status to other drugs, medicaments and biological substances status: Secondary | ICD-10-CM

## 2022-05-13 DIAGNOSIS — I25119 Atherosclerotic heart disease of native coronary artery with unspecified angina pectoris: Secondary | ICD-10-CM

## 2022-05-13 HISTORY — PX: TOTAL KNEE REVISION: SHX996

## 2022-05-13 SURGERY — TOTAL KNEE REVISION
Anesthesia: General | Site: Knee | Laterality: Left

## 2022-05-13 MED ORDER — ORAL CARE MOUTH RINSE: 15.0000 mL | Freq: Once | OROMUCOSAL | Status: AC

## 2022-05-13 MED ORDER — DOCUSATE SODIUM 100 MG PO CAPS
100.0000 mg | ORAL_CAPSULE | Freq: Two times a day (BID) | ORAL | Status: DC
  Administered 2022-05-13 – 2022-05-17 (×8): 100 mg via ORAL
  Filled 2022-05-13 (×9): qty 1

## 2022-05-13 MED ORDER — BUPIVACAINE-EPINEPHRINE (PF) 0.25% -1:200000 IJ SOLN
INTRAMUSCULAR | Status: DC | PRN
  Administered 2022-05-13: 20 mL

## 2022-05-13 MED ORDER — TRANEXAMIC ACID-NACL 1000-0.7 MG/100ML-% IV SOLN
1000.0000 mg | INTRAVENOUS | Status: AC
  Administered 2022-05-13: 1000 mg via INTRAVENOUS

## 2022-05-13 MED ORDER — ESMOLOL HCL 100 MG/10ML IV SOLN
INTRAVENOUS | Status: DC | PRN
  Administered 2022-05-13: 10 mg via INTRAVENOUS

## 2022-05-13 MED ORDER — PHENYLEPHRINE 80 MCG/ML (10ML) SYRINGE FOR IV PUSH (FOR BLOOD PRESSURE SUPPORT)
PREFILLED_SYRINGE | INTRAVENOUS | Status: DC | PRN
  Administered 2022-05-13 (×2): 160 ug via INTRAVENOUS
  Administered 2022-05-13 (×2): 80 ug via INTRAVENOUS

## 2022-05-13 MED ORDER — SIMVASTATIN 20 MG PO TABS
40.0000 mg | ORAL_TABLET | Freq: Every day | ORAL | Status: DC
  Administered 2022-05-13 – 2022-05-16 (×4): 40 mg via ORAL
  Filled 2022-05-13 (×4): qty 2

## 2022-05-13 MED ORDER — LACTATED RINGERS IV SOLN: INTRAVENOUS | Status: DC

## 2022-05-13 MED ORDER — METOCLOPRAMIDE HCL 5 MG PO TABS: 5.0000 mg | ORAL_TABLET | Freq: Three times a day (TID) | ORAL | Status: DC | PRN

## 2022-05-13 MED ORDER — MIDAZOLAM HCL 2 MG/2ML IJ SOLN
INTRAMUSCULAR | Status: AC
  Administered 2022-05-13: 1.5 mg via INTRAVENOUS
  Filled 2022-05-13: qty 2

## 2022-05-13 MED ORDER — EZETIMIBE 10 MG PO TABS
10.0000 mg | ORAL_TABLET | Freq: Every day | ORAL | Status: DC
  Administered 2022-05-13 – 2022-05-16 (×4): 10 mg via ORAL
  Filled 2022-05-13 (×4): qty 1

## 2022-05-13 MED ORDER — 0.9 % SODIUM CHLORIDE (POUR BTL) OPTIME
TOPICAL | Status: DC | PRN
  Administered 2022-05-13: 1000 mL

## 2022-05-13 MED ORDER — CEFAZOLIN SODIUM-DEXTROSE 2-4 GM/100ML-% IV SOLN
INTRAVENOUS | Status: AC
  Filled 2022-05-13: qty 100

## 2022-05-13 MED ORDER — ONDANSETRON HCL 4 MG/2ML IJ SOLN
4.0000 mg | Freq: Four times a day (QID) | INTRAMUSCULAR | Status: DC | PRN
  Administered 2022-05-16: 4 mg via INTRAVENOUS
  Filled 2022-05-13: qty 2

## 2022-05-13 MED ORDER — METHOCARBAMOL 1000 MG/10ML IJ SOLN
500.0000 mg | Freq: Four times a day (QID) | INTRAVENOUS | Status: DC | PRN
  Filled 2022-05-13: qty 5

## 2022-05-13 MED ORDER — METOCLOPRAMIDE HCL 5 MG/ML IJ SOLN: 5.0000 mg | Freq: Three times a day (TID) | INTRAMUSCULAR | Status: DC | PRN

## 2022-05-13 MED ORDER — BUPIVACAINE-EPINEPHRINE (PF) 0.5% -1:200000 IJ SOLN
INTRAMUSCULAR | Status: DC | PRN
  Administered 2022-05-13: 20 mL via PERINEURAL

## 2022-05-13 MED ORDER — PANTOPRAZOLE SODIUM 40 MG PO TBEC
40.0000 mg | DELAYED_RELEASE_TABLET | Freq: Every day | ORAL | Status: DC
  Administered 2022-05-14 – 2022-05-17 (×4): 40 mg via ORAL
  Filled 2022-05-13 (×4): qty 1

## 2022-05-13 MED ORDER — LIDOCAINE 2% (20 MG/ML) 5 ML SYRINGE
INTRAMUSCULAR | Status: DC | PRN
  Administered 2022-05-13: 40 mg via INTRAVENOUS

## 2022-05-13 MED ORDER — ISOSORBIDE MONONITRATE ER 60 MG PO TB24
60.0000 mg | ORAL_TABLET | Freq: Every day | ORAL | Status: DC
  Administered 2022-05-14 – 2022-05-17 (×4): 60 mg via ORAL
  Filled 2022-05-13 (×4): qty 1

## 2022-05-13 MED ORDER — HYDROMORPHONE HCL 1 MG/ML IJ SOLN
INTRAMUSCULAR | Status: AC
  Filled 2022-05-13: qty 1

## 2022-05-13 MED ORDER — MIDAZOLAM HCL 2 MG/2ML IJ SOLN: 1.5000 mg | Freq: Once | INTRAMUSCULAR | Status: AC

## 2022-05-13 MED ORDER — OXYCODONE HCL 5 MG PO TABS
10.0000 mg | ORAL_TABLET | ORAL | Status: DC | PRN
  Administered 2022-05-14 – 2022-05-16 (×5): 15 mg via ORAL
  Administered 2022-05-16 – 2022-05-17 (×2): 10 mg via ORAL
  Filled 2022-05-13 (×6): qty 3

## 2022-05-13 MED ORDER — PHENOL 1.4 % MT LIQD: 1.0000 | OROMUCOSAL | Status: DC | PRN

## 2022-05-13 MED ORDER — BUPIVACAINE-EPINEPHRINE (PF) 0.5% -1:200000 IJ SOLN: INTRAMUSCULAR | Status: DC | PRN

## 2022-05-13 MED ORDER — PROPOFOL 10 MG/ML IV BOLUS
INTRAVENOUS | Status: AC
  Filled 2022-05-13: qty 20

## 2022-05-13 MED ORDER — SODIUM CHLORIDE 0.9 % IV SOLN: INTRAVENOUS | Status: DC

## 2022-05-13 MED ORDER — LISINOPRIL 5 MG PO TABS
5.0000 mg | ORAL_TABLET | Freq: Every day | ORAL | Status: DC
  Administered 2022-05-13 – 2022-05-16 (×4): 5 mg via ORAL
  Filled 2022-05-13 (×4): qty 1

## 2022-05-13 MED ORDER — ASPIRIN 81 MG PO CHEW
81.0000 mg | CHEWABLE_TABLET | Freq: Two times a day (BID) | ORAL | Status: DC
  Administered 2022-05-13 – 2022-05-17 (×8): 81 mg via ORAL
  Filled 2022-05-13 (×8): qty 1

## 2022-05-13 MED ORDER — HYDROMORPHONE HCL 1 MG/ML IJ SOLN
0.5000 mg | INTRAMUSCULAR | Status: DC | PRN
  Administered 2022-05-13 – 2022-05-16 (×6): 1 mg via INTRAVENOUS
  Filled 2022-05-13 (×6): qty 1

## 2022-05-13 MED ORDER — FENTANYL CITRATE (PF) 100 MCG/2ML IJ SOLN
INTRAMUSCULAR | Status: AC
  Filled 2022-05-13: qty 2

## 2022-05-13 MED ORDER — DIPHENHYDRAMINE HCL 12.5 MG/5ML PO ELIX: 12.5000 mg | ORAL_SOLUTION | ORAL | Status: DC | PRN

## 2022-05-13 MED ORDER — DULOXETINE HCL 60 MG PO CPEP: 60.0000 mg | ORAL_CAPSULE | ORAL | Status: DC

## 2022-05-13 MED ORDER — FENTANYL CITRATE (PF) 100 MCG/2ML IJ SOLN
25.0000 ug | INTRAMUSCULAR | Status: DC | PRN
  Administered 2022-05-13 (×2): 25 ug via INTRAVENOUS
  Administered 2022-05-13: 50 ug via INTRAVENOUS
  Administered 2022-05-13 (×2): 25 ug via INTRAVENOUS

## 2022-05-13 MED ORDER — POLYVINYL ALCOHOL-POVIDONE PF 1.4-0.6 % OP SOLN
1.0000 [drp] | Freq: Every day | OPHTHALMIC | Status: DC | PRN
Start: 2022-05-13 — End: 2022-05-13

## 2022-05-13 MED ORDER — ALLOPURINOL 100 MG PO TABS
100.0000 mg | ORAL_TABLET | Freq: Every day | ORAL | Status: DC
  Administered 2022-05-14 – 2022-05-17 (×4): 100 mg via ORAL
  Filled 2022-05-13 (×4): qty 1

## 2022-05-13 MED ORDER — OXYCODONE HCL 5 MG PO TABS
5.0000 mg | ORAL_TABLET | ORAL | Status: DC | PRN
  Administered 2022-05-13: 5 mg via ORAL
  Administered 2022-05-15 (×2): 10 mg via ORAL
  Administered 2022-05-15 (×2): 5 mg via ORAL
  Administered 2022-05-16: 10 mg via ORAL
  Administered 2022-05-17: 5 mg via ORAL
  Administered 2022-05-17: 10 mg via ORAL
  Administered 2022-05-17: 5 mg via ORAL
  Filled 2022-05-13: qty 1
  Filled 2022-05-13 (×9): qty 2

## 2022-05-13 MED ORDER — FENTANYL CITRATE (PF) 250 MCG/5ML IJ SOLN
INTRAMUSCULAR | Status: AC
  Filled 2022-05-13: qty 5

## 2022-05-13 MED ORDER — METHOCARBAMOL 500 MG PO TABS
500.0000 mg | ORAL_TABLET | Freq: Four times a day (QID) | ORAL | Status: DC | PRN
  Administered 2022-05-13 – 2022-05-16 (×10): 500 mg via ORAL
  Filled 2022-05-13 (×10): qty 1

## 2022-05-13 MED ORDER — TRANEXAMIC ACID-NACL 1000-0.7 MG/100ML-% IV SOLN
INTRAVENOUS | Status: AC
  Filled 2022-05-13: qty 100

## 2022-05-13 MED ORDER — POVIDONE-IODINE 10 % EX SWAB
2.0000 "application " | Freq: Once | CUTANEOUS | Status: AC
  Administered 2022-05-13: 2 via TOPICAL

## 2022-05-13 MED ORDER — ACETAMINOPHEN 10 MG/ML IV SOLN
INTRAVENOUS | Status: AC
  Filled 2022-05-13: qty 100

## 2022-05-13 MED ORDER — PHENYLEPHRINE HCL-NACL 20-0.9 MG/250ML-% IV SOLN
INTRAVENOUS | Status: DC | PRN
  Administered 2022-05-13: 20 ug/min via INTRAVENOUS

## 2022-05-13 MED ORDER — MENTHOL 3 MG MT LOZG: 1.0000 | LOZENGE | OROMUCOSAL | Status: DC | PRN

## 2022-05-13 MED ORDER — PROPOFOL 10 MG/ML IV BOLUS
INTRAVENOUS | Status: DC | PRN
  Administered 2022-05-13: 120 mg via INTRAVENOUS

## 2022-05-13 MED ORDER — ALUM & MAG HYDROXIDE-SIMETH 200-200-20 MG/5ML PO SUSP: 30.0000 mL | ORAL | Status: DC | PRN

## 2022-05-13 MED ORDER — ONDANSETRON HCL 4 MG/2ML IJ SOLN
INTRAMUSCULAR | Status: DC | PRN
  Administered 2022-05-13: 4 mg via INTRAVENOUS

## 2022-05-13 MED ORDER — CHLORHEXIDINE GLUCONATE 0.12 % MT SOLN
15.0000 mL | Freq: Once | OROMUCOSAL | Status: AC
  Administered 2022-05-13: 15 mL via OROMUCOSAL
  Filled 2022-05-13: qty 15

## 2022-05-13 MED ORDER — CEFAZOLIN SODIUM-DEXTROSE 1-4 GM/50ML-% IV SOLN
1.0000 g | Freq: Four times a day (QID) | INTRAVENOUS | Status: AC
  Administered 2022-05-13 (×2): 1 g via INTRAVENOUS
  Filled 2022-05-13 (×2): qty 50

## 2022-05-13 MED ORDER — SODIUM CHLORIDE 0.9 % IR SOLN
Status: DC | PRN
  Administered 2022-05-13: 3000 mL

## 2022-05-13 MED ORDER — ACETAMINOPHEN 10 MG/ML IV SOLN
1000.0000 mg | Freq: Once | INTRAVENOUS | Status: AC
Start: 2022-05-13 — End: 2022-05-13
  Administered 2022-05-13: 1000 mg via INTRAVENOUS

## 2022-05-13 MED ORDER — CEFAZOLIN SODIUM-DEXTROSE 2-4 GM/100ML-% IV SOLN
2.0000 g | INTRAVENOUS | Status: AC
  Administered 2022-05-13: 2 g via INTRAVENOUS

## 2022-05-13 MED ORDER — TRAMADOL HCL 50 MG PO TABS
100.0000 mg | ORAL_TABLET | Freq: Four times a day (QID) | ORAL | Status: DC | PRN
  Administered 2022-05-13 – 2022-05-17 (×2): 100 mg via ORAL
  Filled 2022-05-13 (×2): qty 2

## 2022-05-13 MED ORDER — FENTANYL CITRATE (PF) 100 MCG/2ML IJ SOLN
INTRAMUSCULAR | Status: AC
  Administered 2022-05-13: 50 ug via INTRAVENOUS
  Filled 2022-05-13: qty 2

## 2022-05-13 MED ORDER — ACETAMINOPHEN 325 MG PO TABS
325.0000 mg | ORAL_TABLET | Freq: Four times a day (QID) | ORAL | Status: DC | PRN
  Administered 2022-05-14 – 2022-05-17 (×4): 650 mg via ORAL
  Filled 2022-05-13 (×4): qty 2

## 2022-05-13 MED ORDER — ONDANSETRON HCL 4 MG PO TABS: 4.0000 mg | ORAL_TABLET | Freq: Four times a day (QID) | ORAL | Status: DC | PRN

## 2022-05-13 MED ORDER — EPHEDRINE SULFATE-NACL 50-0.9 MG/10ML-% IV SOSY
PREFILLED_SYRINGE | INTRAVENOUS | Status: DC | PRN
  Administered 2022-05-13: 10 mg via INTRAVENOUS

## 2022-05-13 MED ORDER — BUPIVACAINE-EPINEPHRINE (PF) 0.25% -1:200000 IJ SOLN
INTRAMUSCULAR | Status: AC
  Filled 2022-05-13: qty 30

## 2022-05-13 MED ORDER — PROPOFOL 1000 MG/100ML IV EMUL
INTRAVENOUS | Status: AC
Start: 2022-05-13 — End: ?
  Filled 2022-05-13: qty 100

## 2022-05-13 MED ORDER — HYDROMORPHONE HCL 1 MG/ML IJ SOLN
0.5000 mg | INTRAMUSCULAR | Status: DC | PRN
  Administered 2022-05-13: 0.5 mg via INTRAVENOUS

## 2022-05-13 MED ORDER — FENTANYL CITRATE (PF) 100 MCG/2ML IJ SOLN: 50.0000 ug | Freq: Once | INTRAMUSCULAR | Status: AC

## 2022-05-13 MED ORDER — EZETIMIBE-SIMVASTATIN 10-40 MG PO TABS
1.0000 | ORAL_TABLET | Freq: Every day | ORAL | Status: DC
  Filled 2022-05-13 (×2): qty 1

## 2022-05-13 MED ORDER — FENTANYL CITRATE (PF) 250 MCG/5ML IJ SOLN
INTRAMUSCULAR | Status: DC | PRN
  Administered 2022-05-13: 25 ug via INTRAVENOUS
  Administered 2022-05-13: 100 ug via INTRAVENOUS
  Administered 2022-05-13: 50 ug via INTRAVENOUS
  Administered 2022-05-13: 25 ug via INTRAVENOUS

## 2022-05-13 MED ORDER — POLYETHYLENE GLYCOL 3350 17 G PO PACK
17.0000 g | PACK | Freq: Every day | ORAL | Status: DC | PRN
  Administered 2022-05-16: 17 g via ORAL
  Filled 2022-05-13: qty 1

## 2022-05-13 SURGICAL SUPPLY — 66 items
ATTUNE PSRP INSR SZ5 8 KNEE (Insert) ×1 IMPLANT
BAG COUNTER SPONGE SURGICOUNT (BAG) ×3 IMPLANT
BAG SPNG CNTER NS LX DISP (BAG) ×1
BANDAGE ESMARK 6X9 LF (GAUZE/BANDAGES/DRESSINGS) ×2 IMPLANT
BLADE CLIPPER SURG (BLADE) IMPLANT
BLADE SAG 18X100X1.27 (BLADE) ×3 IMPLANT
BLADE SAGITTAL 25.0X1.27X90 (BLADE) ×3 IMPLANT
BNDG CMPR 9X6 STRL LF SNTH (GAUZE/BANDAGES/DRESSINGS) ×1
BNDG COHESIVE 6X5 TAN STRL LF (GAUZE/BANDAGES/DRESSINGS) ×4 IMPLANT
BNDG ELASTIC 6X5.8 VLCR STR LF (GAUZE/BANDAGES/DRESSINGS) ×3 IMPLANT
BNDG ESMARK 6X9 LF (GAUZE/BANDAGES/DRESSINGS) ×2
BOWL SMART MIX CTS (DISPOSABLE) IMPLANT
COOLER ICEMAN CLASSIC (MISCELLANEOUS) ×1 IMPLANT
COVER SURGICAL LIGHT HANDLE (MISCELLANEOUS) ×3 IMPLANT
CUFF TOURN SGL QUICK 34 (TOURNIQUET CUFF) ×2
CUFF TOURN SGL QUICK 42 (TOURNIQUET CUFF) IMPLANT
CUFF TRNQT CYL 34X4.125X (TOURNIQUET CUFF) IMPLANT
DRAPE EXTREMITY T 121X128X90 (DISPOSABLE) ×1 IMPLANT
DRAPE ORTHO SPLIT 77X108 STRL (DRAPES) ×4
DRAPE SURG ORHT 6 SPLT 77X108 (DRAPES) ×4 IMPLANT
DRAPE U-SHAPE 47X51 STRL (DRAPES) ×3 IMPLANT
DRSG PAD ABDOMINAL 8X10 ST (GAUZE/BANDAGES/DRESSINGS) ×6 IMPLANT
DURAPREP 26ML APPLICATOR (WOUND CARE) ×3 IMPLANT
ELECT REM PT RETURN 9FT ADLT (ELECTROSURGICAL) ×2
ELECTRODE REM PT RTRN 9FT ADLT (ELECTROSURGICAL) ×2 IMPLANT
EVACUATOR 1/8 PVC DRAIN (DRAIN) IMPLANT
FACESHIELD STD STERILE (MASK) ×6 IMPLANT
GAUZE SPONGE 4X4 12PLY STRL (GAUZE/BANDAGES/DRESSINGS) ×3 IMPLANT
GAUZE XEROFORM 1X8 LF (GAUZE/BANDAGES/DRESSINGS) ×3 IMPLANT
GLOVE BIO SURGEON STRL SZ8 (GLOVE) ×3 IMPLANT
GLOVE BIOGEL PI IND STRL 8 (GLOVE) ×4 IMPLANT
GLOVE BIOGEL PI INDICATOR 8 (GLOVE) ×2
GLOVE ORTHO TXT STRL SZ7.5 (GLOVE) ×3 IMPLANT
GOWN STRL REUS W/ TWL LRG LVL3 (GOWN DISPOSABLE) ×2 IMPLANT
GOWN STRL REUS W/ TWL XL LVL3 (GOWN DISPOSABLE) ×4 IMPLANT
GOWN STRL REUS W/TWL LRG LVL3 (GOWN DISPOSABLE) ×2
GOWN STRL REUS W/TWL XL LVL3 (GOWN DISPOSABLE) ×4
HANDPIECE INTERPULSE COAX TIP (DISPOSABLE) ×2
IMMOBILIZER KNEE 22 UNIV (SOFTGOODS) ×2 IMPLANT
KIT BASIN OR (CUSTOM PROCEDURE TRAY) ×3 IMPLANT
KIT TURNOVER KIT B (KITS) ×3 IMPLANT
MANIFOLD NEPTUNE II (INSTRUMENTS) ×3 IMPLANT
NS IRRIG 1000ML POUR BTL (IV SOLUTION) ×3 IMPLANT
PACK TOTAL JOINT (CUSTOM PROCEDURE TRAY) ×3 IMPLANT
PAD ARMBOARD 7.5X6 YLW CONV (MISCELLANEOUS) ×6 IMPLANT
PAD COLD SHLDR WRAP-ON (PAD) ×1 IMPLANT
PADDING CAST COTTON 6X4 STRL (CAST SUPPLIES) ×3 IMPLANT
SET HNDPC FAN SPRY TIP SCT (DISPOSABLE) IMPLANT
SET PAD KNEE POSITIONER (MISCELLANEOUS) ×3 IMPLANT
STAPLER VISISTAT 35W (STAPLE) ×4 IMPLANT
SUCTION FRAZIER HANDLE 10FR (MISCELLANEOUS) ×2
SUCTION TUBE FRAZIER 10FR DISP (MISCELLANEOUS) ×2 IMPLANT
SUT VIC AB 0 CT1 27 (SUTURE) ×4
SUT VIC AB 0 CT1 27XBRD ANBCTR (SUTURE) ×4 IMPLANT
SUT VIC AB 1 CT1 27 (SUTURE) ×4
SUT VIC AB 1 CT1 27XBRD ANBCTR (SUTURE) ×4 IMPLANT
SUT VIC AB 2-0 CT1 27 (SUTURE) ×4
SUT VIC AB 2-0 CT1 TAPERPNT 27 (SUTURE) ×4 IMPLANT
SWAB COLLECTION DEVICE MRSA (MISCELLANEOUS) IMPLANT
SWAB CULTURE ESWAB REG 1ML (MISCELLANEOUS) ×2 IMPLANT
TOWEL GREEN STERILE (TOWEL DISPOSABLE) ×3 IMPLANT
TOWEL GREEN STERILE FF (TOWEL DISPOSABLE) ×3 IMPLANT
TRAY FOLEY W/BAG SLVR 16FR (SET/KITS/TRAYS/PACK)
TRAY FOLEY W/BAG SLVR 16FR ST (SET/KITS/TRAYS/PACK) IMPLANT
WATER STERILE IRR 1000ML POUR (IV SOLUTION) ×9 IMPLANT
WRAP KNEE MAXI GEL POST OP (GAUZE/BANDAGES/DRESSINGS) ×2 IMPLANT

## 2022-05-13 NOTE — Anesthesia Procedure Notes (Addendum)
Anesthesia Regional Block: Adductor canal block   Pre-Anesthetic Checklist: , timeout performed,  Correct Patient, Correct Site, Correct Laterality,  Correct Procedure, Correct Position, site marked,  Risks and benefits discussed,  Surgical consent,  Pre-op evaluation,  At surgeon's request and post-op pain management  Laterality: Left  Prep: chloraprep       Needles:  Injection technique: Single-shot  Needle Type: Stimulator Needle - 80          Additional Needles:   Procedures: Doppler guided,,,, ultrasound used (permanent image in chart),,    Narrative:  Start time: 05/13/2022 8:25 AM End time: 05/13/2022 8:40 AM Injection made incrementally with aspirations every 5 mL.  Performed by: Other  Anesthesiologist: Dorris Singh, MD

## 2022-05-13 NOTE — Anesthesia Postprocedure Evaluation (Signed)
Anesthesia Post Note  Patient: Erica Frank  Procedure(s) Performed: SYNOVECTOMY AND LEFT KNEE POLY-LINER EXCHANGE (Left: Knee)     Patient location during evaluation: PACU Anesthesia Type: General Level of consciousness: awake Pain management: pain level controlled Vital Signs Assessment: post-procedure vital signs reviewed and stable Respiratory status: spontaneous breathing Cardiovascular status: stable Postop Assessment: no apparent nausea or vomiting Anesthetic complications: no   No notable events documented.  Last Vitals:  Vitals:   05/13/22 1300 05/13/22 1333  BP: 137/73 140/74  Pulse: 60 61  Resp: 12 16  Temp: 36.6 C 36.6 C  SpO2: 92% 99%    Last Pain:  Vitals:   05/13/22 1824  TempSrc:   PainSc: 5                  Seichi Kaufhold

## 2022-05-13 NOTE — Anesthesia Procedure Notes (Signed)
Procedure Name: LMA Insertion Date/Time: 05/13/2022 9:24 AM  Performed by: Kara Mead, CRNAPre-anesthesia Checklist: Patient identified, Emergency Drugs available, Suction available, Patient being monitored and Timeout performed Patient Re-evaluated:Patient Re-evaluated prior to induction Oxygen Delivery Method: Circle system utilized Preoxygenation: Pre-oxygenation with 100% oxygen Induction Type: IV induction Ventilation: Mask ventilation without difficulty LMA: LMA inserted LMA Size: 4.0 Number of attempts: 1 Tube secured with: Tape Dental Injury: Teeth and Oropharynx as per pre-operative assessment

## 2022-05-13 NOTE — H&P (Signed)
Erica Frank is an 81 y.o. female.   Chief Complaint: Painful left total knee arthroplasty HPI: The patient is an 81 year old female who had a primary total knee arthroplasty performed in November 2021 by one of my colleagues in town.  She has hurt ever since then and a work-up by her primary orthopedic surgeon was negative for prosthetic loosening or any other abnormalities that could be seen and he appropriately told her to give this time.  She then sought a second opinion with me and certainly agreed that she needed to give this time.  There is no evidence of infection or loosening of the implants on bone scan or other studies.  Her plain films show normal-appearing implant she has good range of motion and it feels ligamentously stable.  However, given the continued severe pain with her left knee she does wish to proceed with an exploration of the knee and revision of components as necessary depending on her interoperative findings.  She does have some recurrent synovitis of the knee but no large effusion and she understands at the minimum we will performed a synovectomy with a polyliner exchange.  Past Medical History:  Diagnosis Date   Anginal pain ([place]) 1989   Anxiety    Arthritis    Atherosclerosis    Atrial fibrillation ([place])    Coronary artery disease    Dysrhythmia    a-fib   Elevated liver enzymes    GERD (gastroesophageal reflux disease)    Heart attack ([place]) 2020   Heart disease    High cholesterol    Hypertension     Past Surgical History:  Procedure Laterality Date   ABDOMINAL HYSTERECTOMY  1983   for endometriosis with [place]   ANGIOPLASTY     ANGIOPLASTY     BACK SURGERY     1990   CARDIAC SURGERY     Catheterization   CORONARY STENT INTERVENTION N/A 07/09/2019   Procedure: CORONARY STENT INTERVENTION;  Surgeon: Adrian Prows, MD;  Location: [facility];  Service: Cardiovascular;  Laterality: N/A;   LEFT HEART CATH AND CORONARY ANGIOGRAPHY N/A 07/09/2019    Procedure: LEFT HEART CATH AND CORONARY ANGIOGRAPHY and possible intervention;  Surgeon: Adrian Prows, MD;  Location: [place] [facility];  Service: Cardiovascular;  Laterality: N/A;  4: or 4:30 PM today   LUMBAR LAMINECTOMY/DECOMPRESSION MICRODISCECTOMY Left 02/02/2022   Procedure: Left Lumbar two-three, Lumbar five-Sacral one Sublaminar decompression;  Surgeon: Kristeen Miss, MD;  Location: [facility];  Service: Neurosurgery;  Laterality: Left;   NECK SURGERY     TOTAL KNEE ARTHROPLASTY Left 10/07/2020   Procedure: TOTAL KNEE ARTHROPLASTY;  Surgeon: Paralee Cancel, MD;  Location: [facility];  Service: Orthopedics;  Laterality: Left;  70 mins    Family History  Problem Relation Age of Onset   Uterine cancer Mother    Hypertension Brother    Heart disease Brother    Breast cancer Cousin    Social History:  reports that she has never smoked. She has never used smokeless tobacco. She reports that she does not currently use alcohol after a past usage of about 3.0 standard drinks of alcohol per week. She reports that she does not use drugs.  Allergies:  Allergies  Allergen Reactions   Hydrocodone     "Makes her feel crazy" Depressed   Repatha [Evolocumab] Diarrhea    Medications Prior to Admission  Medication Sig Dispense Refill   acetaminophen (TYLENOL) 500 MG tablet Take 1,000 mg by mouth daily as  needed (pain).     Alirocumab (PRALUENT) 75 MG/ML SOAJ Inject 75 mg into the skin every 14 (fourteen) days. 6 mL 3   allopurinol (ZYLOPRIM) 100 MG tablet Take 100 mg by mouth daily.     BIOTIN PO Take 1 tablet by mouth daily. Hair, skin & nails     Cyanocobalamin (VITAMIN B-12 PO) Take 1 tablet by mouth See admin instructions. Take 1 tablet by mouth daily when able to remember     ezetimibe-simvastatin (VYTORIN) 10-40 MG tablet TAKE 1 TABLET BY MOUTH DAILY (Patient taking differently: Take 1 tablet by mouth at bedtime.) 90 tablet 1   isosorbide mononitrate (IMDUR) 60 MG 24 hr tablet TAKE 1 TABLET(60 MG)  BY MOUTH DAILY (Patient taking differently: Take 60 mg by mouth daily.) 90 tablet 3   lisinopril (ZESTRIL) 5 MG tablet TAKE 1 TABLET(5 MG) BY MOUTH DAILY (Patient taking differently: Take 5 mg by mouth at bedtime.) 90 tablet 3   traMADol (ULTRAM) 50 MG tablet Take 100 mg by mouth daily.     albuterol (VENTOLIN HFA) 108 (90 Base) MCG/ACT inhaler SMARTSIG:2 Puff(s) By Mouth Every 4 Hours PRN (Patient not taking: Reported on 05/06/2022)     ALPRAZolam (XANAX) 0.25 MG tablet Take 0.25 mg by mouth at bedtime as needed for sleep. (Patient not taking: Reported on 05/06/2022)     aspirin EC 81 MG tablet Take 81 mg by mouth daily. Swallow whole.     CALCIUM PO Take 1 tablet by mouth daily.     doxycycline (VIBRAMYCIN) 100 MG capsule Take 1 capsule (100 mg total) by mouth 2 (two) times daily. (Patient not taking: Reported on 05/06/2022) 20 capsule 0   DULoxetine (CYMBALTA) 30 MG capsule Take 30 mg by mouth daily as needed (knee pain). (Patient not taking: Reported on 05/06/2022)     DULoxetine (CYMBALTA) 60 MG capsule Take 60 mg by mouth See admin instructions. Take 60 mg by mouth daily PRN for knee pain     methocarbamol (ROBAXIN) 500 MG tablet Take 1 tablet (500 mg total) by mouth every 6 (six) hours as needed for muscle spasms. (Patient taking differently: Take 500 mg by mouth daily as needed for muscle spasms.) 40 tablet 0   methocarbamol (ROBAXIN) 750 MG tablet Take 750 mg by mouth daily as needed for muscle spasms.     nitroGLYCERIN (NITROSTAT) 0.4 MG SL tablet Place 1 tablet (0.4 mg total) under the tongue every 5 (five) minutes as needed for chest pain. 25 tablet 11   ondansetron (ZOFRAN) 4 MG tablet Take 4 mg by mouth every 8 (eight) hours as needed for nausea or vomiting. (Patient not taking: Reported on 05/06/2022)     Polyvinyl Alcohol-Povidone PF (REFRESH) 1.4-0.6 % SOLN Place 1 drop into both eyes daily as needed (Dry eyes).      No results found for this or any previous visit (from the past 48  hour(s)). No results found.  Review of Systems  All other systems reviewed and are negative.   Blood pressure 115/67, pulse 61, temperature 97.7 F (36.5 C), temperature source Oral, resp. rate 18, height 5' (1.524 m), weight 58.1 kg, SpO2 94 %. Physical Exam Vitals reviewed.  Constitutional:      Appearance: Normal appearance.  HENT:     Head: Normocephalic and atraumatic.  Eyes:     Extraocular Movements: Extraocular movements intact.     Pupils: Pupils are equal, round, and reactive to light.  Cardiovascular:     Rate and Rhythm:  Normal rate.     Pulses: Normal pulses.  Pulmonary:     Effort: Pulmonary effort is normal.     Breath sounds: Normal breath sounds.  Abdominal:     General: Abdomen is flat.     Palpations: Abdomen is soft.  Musculoskeletal:     Cervical back: Normal range of motion and neck supple.     Left knee: Tenderness present over the medial joint line, lateral joint line and patellar tendon.  Neurological:     Mental Status: She is alert and oriented to person, place, and time.  Psychiatric:        Behavior: Behavior normal.      Assessment/Plan Painful left total knee arthroplasty  The plan is to proceed to surgery today for left knee open synovectomy with at the minimum polyliner exchange versus revision of components.  We will assess for infection or any other abnormalities that are causing her to continue chronic pain that she has with that left knee.  The risks and benefits have been explained to her in detail and informed consent is obtained.  Mcarthur Rossetti, MD 05/13/2022, 7:58 AM

## 2022-05-13 NOTE — Plan of Care (Signed)

## 2022-05-13 NOTE — Plan of Care (Signed)
?  Problem: Education: ?Goal: Knowledge of General Education information will improve ?Description: Including pain rating scale, medication(s)/side effects and non-pharmacologic comfort measures ?Outcome: Progressing ?  ?Problem: Clinical Measurements: ?Goal: Respiratory complications will improve ?Outcome: Progressing ?  ?Problem: Elimination: ?Goal: Will not experience complications related to urinary retention ?Outcome: Progressing ?  ?Problem: Pain Managment: ?Goal: General experience of comfort will improve ?Outcome: Progressing ?  ?Problem: Safety: ?Goal: Ability to remain free from injury will improve ?Outcome: Progressing ?  ?Problem: Skin Integrity: ?Goal: Risk for impaired skin integrity will decrease ?Outcome: Progressing ?  ?

## 2022-05-13 NOTE — Op Note (Signed)
Erica Frank, [place] MEDICAL RECORD NO: [id number] ACCOUNT NO: [id number] DATE OF BIRTH: March 08, 1941 FACILITY: [facility] LOCATION: [facility] PHYSICIAN: Vanita Panda. Erica Ivan, MD  Operative Report   DATE OF PROCEDURE: 05/13/2022  PREOPERATIVE DIAGNOSIS:  Painful left total knee arthroplasty with synovitis.  POSTOPERATIVE DIAGNOSES:  Painful left total knee arthroplasty with synovitis.  PROCEDURES:  Left knee arthrotomy with synovectomy and poly liner exchange/upsizing of poly liner.  IMPLANTS:  DePuy Attune RP size 5, 8 mm insert implanted and a 7 mm implant poly liner removed.  SURGEON:  Vanita Panda. Erica Ivan, MD.  ASSISTANT:  Rexene Edison, PA-C.  ANESTHESIA:   1.  Left lower extremity adductor canal block. 2.  General. 3.  Local with 0.25% Marcaine with epinephrine around the arthrotomy.  TOURNIQUET TIME:  Less than 1 hour.  ESTIMATED BLOOD LOSS:  Less than 100 mL  ANTIBIOTICS:  2 g IV Ancef.  COMPLICATIONS:  None.  INDICATIONS:  The patient is an 81 year old female who had a left primary total knee arthroplasty done by one of my colleagues in town in November 2021.  Ever since that surgery her knee has been painful and sore.  Eventually she sought a second opinion  with me.  The x-ray showed a normal-appearing implant and on exam, she just has a mild effusion, but really no significant instability on exam.  She describes global pain and this hurt ever since the surgery itself.  There was no evidence of a neuroma  and she has excellent range of motion of the knee.  Eventually after a year of still hurting at 13 months of bone scan was obtained showing no evidence of prosthetic loosening, but certainly some synovitis in the knee.  I have placed a steroid injection  in the knee once and had her give this time with physical therapy and pain medications.  She is not on any narcotics though and continues to hurt to the point she does wish to proceed with some type of surgical intervention  given her continued pain.  I  feel like this is reasonable at this point given the long failure of conservative treatment for over a year and a half to getting close to 2 years with dealing with that knee.  I talked about the possibility of having infection, having removed all the  implants.  I talked to her about just a poly liner exchange and synovectomy versus revision arthroplasty if the components are grossly loose.  We also discussed the risk of acute blood loss anemia, nerve or vessel injury, fracture, infection, DVT,  continued implant failure and continued pain. She understands this is quite difficult to find a source of her painful knee arthroplasty when it is not readily evident.  DESCRIPTION OF PROCEDURE:  After informed consent was obtained, appropriate left knee was marked and adductor canal block was obtained in the holding room.  She was then brought to the operating room and placed supine on the operating table.  General  anesthesia was then obtained.  I then assessed her knee clinically, there is a mild effusion.  There is no redness.  There is just a slight play in clinical exam with varus and valgus stressing and just a slight play with drawer sign, but this is  minimal.  We then placed a nonsterile tourniquet was placed around the upper left thigh and left thigh, knee, leg, ankle and foot were prepped and draped with DuraPrep and sterile drapes including a sterile stockinette.  A timeout was called.  She  was  identified correct patient, correct left knee.  We then used Esmarch to wrap that leg and tourniquet was inflated to 300 mm of pressure.  I then made an incision directly over the old incision, carried this proximally and distally.  I dissected down the  knee joint, carried out a medial parapatellar arthrotomy, identified an effusion in the knee, but it was clear there was no evidence of infection at all.  The synovium was only slightly angry and I did perform a synovectomy in all  3 compartments of the  knee, but again there was no evidence of infection and the synovium was just inflamed.  I did assess the undersurface of the patella easily and did not see any wear on the polyethylene patellar button.  We then flexed the knee and removed the size 5-7 mm  polyethylene insert.  We did not find any significant wear on the post or the medial or lateral aspects of the polyethylene liner.  I then assessed the femoral component and the tibial components and we saw no evidence of loosening at all.  I also used  electrocautery to remove any scar tissue from the posterior aspect of the knee.  We did not see any other worrisome findings.  We then trialled an 8 mm thickness insert to upsize 1 insert and that did give her a little bit more stability with varus and  valgus stressing and less drawer sign.  We felt like it was better to go ahead and upsize her insert.  We then irrigated the knee with 3 liters of normal saline solution using pulsatile lavage.  I then placed the real size 5-8 mm Attune RP polyethylene  insert and put her through several cycles of motion.  We were pleased with range of motion and stability.  We then irrigated the knee again with normal saline solution.  Prior to placing the polyethylene liner I did place 0.25% Marcaine with epinephrine  around the arthrotomy.  Once the polyethylene insert was in place, the tourniquet was let down and hemostasis was obtained with electrocautery.  We then closed our arthrotomy with interrupted #1 Vicryl suture followed by 0 Vicryl to close the deep tissue  and 2-0 Vicryl to close the subcutaneous tissue.  The skin was closed with staples.  A well-padded sterile dressing was applied.  She was awakened, extubated, and taken to recovery room in stable condition with all final counts being correct.  No  complications noted.  Of note, Rexene Edison, PA-C's assistance was crucial and medically necessary throughout this case from beginning to end.   He was involved with soft tissue management and retracting as well as helping get the polyethylene liner  exchange.  He was involved directly with a layered closure of the wound.   PUS D: 05/13/2022 10:27:21 am T: 05/13/2022 1:18:00 pm  JOB: [id number]/ [id number]

## 2022-05-13 NOTE — Transfer of Care (Signed)
Immediate Anesthesia Transfer of Care Note  Patient: Erica Frank  Procedure(s) Performed: SYNOVECTOMY AND LEFT KNEE POLY-LINER EXCHANGE (Left: Knee)  Patient Location: PACU  Anesthesia Type:General and Regional  Level of Consciousness: drowsy  Airway & Oxygen Therapy: Patient Spontanous Breathing and Patient connected to face mask oxygen  Post-op Assessment: Report given to RN and Post -op Vital signs reviewed and stable  Post vital signs: Reviewed and stable  Last Vitals:  Vitals Value Taken Time  BP 168/86 05/13/22 1043  Temp    Pulse 89 05/13/22 1045  Resp 13 05/13/22 1045  SpO2 96 % 05/13/22 1045  Vitals shown include unvalidated device data.  Last Pain:  Vitals:   05/13/22 0744  TempSrc:   PainSc: 0-No pain         Complications: No notable events documented.

## 2022-05-13 NOTE — Progress Notes (Signed)
Pt arrived to room 5N09 via bed after surgery. Received report from [place], [place] in PACU. See assessment. Will continue to monitor.

## 2022-05-13 NOTE — Progress Notes (Signed)
PT Cancellation Note  Patient Details Name: Aanyah Kohen MRN: [id number] DOB: 1941-07-30   Cancelled Treatment:    Reason Eval/Treat Not Completed: Fatigue/lethargy limiting ability to participate;Pain limiting ability to participate Pt reports she is not feeling well and would like to defer PT until tomorrow. Will follow up as schedule allows.   Lou Miner, DPT  Acute Rehabilitation Services  Office: [phone]  Rudean Hitt 05/13/2022, 4:23 PM

## 2022-05-13 NOTE — Brief Op Note (Signed)
05/13/2022  10:29 AM  PATIENT:  Erica Frank  81 y.o. female  PRE-OPERATIVE DIAGNOSIS:  painful left total knee with synovitis  POST-OPERATIVE DIAGNOSIS:  painful left total knee with synovitis  PROCEDURE:  Procedure(s): SYNOVECTOMY AND LEFT KNEE POLY-LINER EXCHANGE (Left)  SURGEON:  Surgeon(s) and Role:    Kathryne Hitch, MD - Primary  PHYSICIAN ASSISTANT: Rexene Edison, PA-C  ANESTHESIA:   local, regional, and general  EBL:  20 mL   COUNTS:  YES  TOURNIQUET:   Total Tourniquet Time Documented: Thigh (Left) - 27 minutes Total: Thigh (Left) - 27 minutes   DICTATION: .Other Dictation: Dictation Number [id number]  PLAN OF CARE: Admit to inpatient   PATIENT DISPOSITION:  PACU - hemodynamically stable.   Delay start of Pharmacological VTE agent (>24hrs) due to surgical blood loss or risk of bleeding: no

## 2022-05-14 ENCOUNTER — Encounter (HOSPITAL_COMMUNITY): Payer: Self-pay | Admitting: Orthopaedic Surgery

## 2022-05-14 LAB — BASIC METABOLIC PANEL
Anion gap: 4 — ABNORMAL LOW (ref 5–15)
BUN: 6 mg/dL — ABNORMAL LOW (ref 8–23)
CO2: 30 mmol/L (ref 22–32)
Calcium: 8.9 mg/dL (ref 8.9–10.3)
Chloride: 103 mmol/L (ref 98–111)
Creatinine, Ser: 0.79 mg/dL (ref 0.44–1.00)
GFR, Estimated: >60 mL/min (ref >60)
Glucose, Bld: 119 mg/dL — ABNORMAL HIGH (ref 70–99)
Potassium: 4.6 mmol/L (ref 3.5–5.1)
Sodium: 137 mmol/L (ref 135–145)

## 2022-05-14 LAB — CBC
HCT: 41.2 % (ref 36.0–46.0)
Hemoglobin: 13.7 g/dL (ref 12.0–15.0)
MCH: 32.2 pg (ref 26.0–34.0)
MCHC: 33.3 g/dL (ref 30.0–36.0)
MCV: 96.9 fL (ref 80.0–100.0)
Platelets: 256 10*3/uL (ref 150–400)
RBC: 4.25 MIL/uL (ref 3.87–5.11)
RDW: 12.9 % (ref 11.5–15.5)
WBC: 8.2 10*3/uL (ref 4.0–10.5)
nRBC: 0 % (ref 0.0–0.2)

## 2022-05-14 MED ORDER — ASPIRIN 81 MG PO CHEW: 81.0000 mg | CHEWABLE_TABLET | Freq: Two times a day (BID) | ORAL | 0 refills | Status: DC

## 2022-05-14 MED ORDER — METHOCARBAMOL 500 MG PO TABS: 500.0000 mg | ORAL_TABLET | Freq: Four times a day (QID) | ORAL | 0 refills | Status: DC | PRN

## 2022-05-14 MED ORDER — OXYCODONE HCL 5 MG PO TABS: 5.0000 mg | ORAL_TABLET | ORAL | 0 refills | Status: DC | PRN

## 2022-05-14 NOTE — Progress Notes (Signed)
Subjective: 1 Day Post-Op Procedure(s) (LRB): SYNOVECTOMY AND LEFT KNEE POLY-LINER EXCHANGE (Left) Patient reports pain as quite significant.    Objective: Vital signs in last 24 hours: Temp:  [97.4 F (36.3 C)-98.1 F (36.7 C)] 98 F (36.7 C) (06/09 0421) Pulse Rate:  [53-89] 89 (06/09 0421) Resp:  [12-20] 16 (06/09 0421) BP: (102-168)/(60-89) 135/89 (06/09 0421) SpO2:  [90 %-99 %] 91 % (06/09 0421) Weight:  [58.1 kg-64.3 kg] 64.3 kg (06/08 1333)  Intake/Output from previous day: 06/08 0701 - 06/09 0700 In: 1521.2 [P.O.:360; I.V.:911.2; IV Piggyback:250] Out: 270 [Urine:250; Blood:20] Intake/Output this shift: No intake/output data recorded.  Recent Labs    05/14/22 0459  HGB 13.7   Recent Labs    05/14/22 0459  WBC 8.2  RBC 4.25  HCT 41.2  PLT 256   Recent Labs    05/14/22 0459  NA 137  K 4.6  CL 103  CO2 30  BUN 6*  CREATININE 0.79  GLUCOSE 119*  CALCIUM 8.9   No results for input(s): "LABPT", "INR" in the last 72 hours.  Sensation intact distally Intact pulses distally Dorsiflexion/Plantar flexion intact Incision: dressing C/D/I No cellulitis present Compartment soft   Assessment/Plan: 1 Day Post-Op Procedure(s) (LRB): SYNOVECTOMY AND LEFT KNEE POLY-LINER EXCHANGE (Left) Up with therapy  Further disposition will be depending how her pain is controlled and how she does with mobility working with physical therapy.    Mcarthur Rossetti 05/14/2022, 7:02 AM

## 2022-05-14 NOTE — Progress Notes (Signed)
Patient ID: Erica Frank, female   DOB: [birth year], 81 y.o.   MRN: [id number] The patient is still struggling with pain control which limits her mobility.  I gave her reassurance that our surgery was minimal in terms of just a polyliner exchange removing some scar tissue but I certainly understand that she is having a significant mount of pain.  Her calf is still soft and she is neurovascularly intact.  She understands that going home will be when she is safe from a mobility standpoint.  She feels like she will be able to discharge to home tomorrow and hopefully that can be the case.  I will send in medications to [facility] pharmacy for the potential for discharge home tomorrow.

## 2022-05-14 NOTE — Evaluation (Signed)
Physical Therapy Evaluation Patient Details Name: Erica Frank MRN: [id number] DOB: [birth year] Today's Date: 05/14/2022  History of Present Illness  81 yo female with onset of significant pain in L knee since sx November 2022 was admitted 6/8 for revision of TKA.  Pt received arthrotomy and synovectomy and polyliner exchange on L knee, with plan to follow up with home therapy.  PMHx:  HTN, MI, A-fib, CAD, L lumbar laminectomy, atherosclerosis, angina, HTN,  Clinical Impression  Pt was seen for mobility after her knee revision on LLE, and demonstrates good initial movement to get on BSC and to sit up in the chair.  Her PLOF was to live at home with family to assist her, and given her pain will need to accomplish more independent movement to consider that discharge setting.  MD is set to make dc recommendations and so PT will work toward a home dc if possible.  Pt will need to walk a longer trip, will need to demonstrate more independence standing and will need to increase her control of static and dynamic standing balance.  Goals for acute PT are outlined below.       Recommendations for follow up therapy are one component of a multi-disciplinary discharge planning process, led by the attending physician.  Recommendations may be updated based on patient status, additional functional criteria and insurance authorization.  Follow Up Recommendations Follow physician's recommendations for discharge plan and follow up therapies    Assistance Recommended at Discharge Intermittent Supervision/Assistance  Patient can return home with the following  A little help with walking and/or transfers;A little help with bathing/dressing/bathroom;Assistance with cooking/housework;Assist for transportation;Help with stairs or ramp for entrance    Equipment Recommendations None recommended by PT  Recommendations for Other Services       Functional Status Assessment Patient has had a recent decline in their  functional status and demonstrates the ability to make significant improvements in function in a reasonable and predictable amount of time.     Precautions / Restrictions Precautions Precautions: Knee;Fall Precaution Booklet Issued: No Precaution Comments: verbally reviewed information Required Braces or Orthoses: Knee Immobilizer - Left Knee Immobilizer - Left: On when out of bed or walking Restrictions Weight Bearing Restrictions: Yes LLE Weight Bearing: Weight bearing as tolerated      Mobility  Bed Mobility Overal bed mobility: Needs Assistance Bed Mobility: Supine to Sit     Supine to sit: Min assist     General bed mobility comments: minor help to move LLE and support trunk    Transfers Overall transfer level: Needs assistance Equipment used: Rolling walker (2 wheels), 1 person hand held assist Transfers: Sit to/from Stand Sit to Stand: Mod assist           General transfer comment: mod to come up to stand and by end of session was min assist    Ambulation/Gait Ambulation/Gait assistance: Min assist Gait Distance (Feet): 10 Feet Assistive device: Rolling walker (2 wheels), 1 person hand held assist Gait Pattern/deviations: Step-to pattern, Decreased stride length, Wide base of support (LLE is lagging a bit with walker) Gait velocity: reduced Gait velocity interpretation: <1.31 ft/sec, indicative of household ambulator Pre-gait activities: standing balance ck    Stairs            Wheelchair Mobility    Modified Rankin (Stroke Patients Only)       Balance Overall balance assessment: Needs assistance Sitting-balance support: Feet supported Sitting balance-Leahy Scale: Fair     Standing balance support: Bilateral  upper extremity supported, During functional activity Standing balance-Leahy Scale: Poor Standing balance comment: pt is using walker for WB support but getting benefit from it to move and reduce L knee pressure                              Pertinent Vitals/Pain Pain Assessment Pain Assessment: 0-10 Pain Score: 9  Pain Location: L knee Pain Descriptors / Indicators: Operative site guarding, Grimacing Pain Intervention(s): Limited activity within patient's tolerance, Monitored during session, Premedicated before session, Repositioned, Ice applied    Home Living Family/patient expects to be discharged to:: Private residence Living Arrangements: Children Available Help at Discharge: Family Type of Home: House Home Access: Ramped entrance       Home Layout: One level Home Equipment: Conservation officer, nature (2 wheels);Cane - single point;Crutches;BSC/3in1;Wheelchair Training and development officer      Prior Function Prior Level of Function : Independent/Modified Independent;Driving             Mobility Comments: using RW for support prior to this surgery       Hand Dominance   Dominant Hand: Right    Extremity/Trunk Assessment   Upper Extremity Assessment Upper Extremity Assessment: Overall WFL for tasks assessed    Lower Extremity Assessment Lower Extremity Assessment: LLE deficits/detail LLE Deficits / Details: new TKA revision with ROM and strength deficits as expected LLE: Unable to fully assess due to immobilization LLE Coordination: decreased gross motor    Cervical / Trunk Assessment Cervical / Trunk Assessment: Kyphotic (mild)  Communication   Communication: No difficulties  Cognition Arousal/Alertness: Awake/alert Behavior During Therapy: WFL for tasks assessed/performed Overall Cognitive Status: Within Functional Limits for tasks assessed                                 General Comments: pt is motivated in spite of the pain        General Comments General comments (skin integrity, edema, etc.): pt is up in chair at end of session and has pain relief with application of ice at the end    Exercises     Assessment/Plan    PT Assessment Patient needs continued  PT services  PT Problem List Decreased strength;Decreased range of motion;Decreased activity tolerance;Decreased balance;Decreased mobility;Decreased skin integrity;Pain       PT Treatment Interventions DME instruction;Gait training;Stair training;Functional mobility training;Therapeutic activities;Therapeutic exercise;Balance training;Neuromuscular re-education;Patient/family education    PT Goals (Current goals can be found in the Care Plan section)  Acute Rehab PT Goals Patient Stated Goal: to get pain under control on L knee PT Goal Formulation: With patient Time For Goal Achievement: 05/28/22 Potential to Achieve Goals: Good    Frequency 7X/week     Co-evaluation               AM-PAC PT "6 Clicks" Mobility  Outcome Measure Help needed turning from your back to your side while in a flat bed without using bedrails?: A Little Help needed moving from lying on your back to sitting on the side of a flat bed without using bedrails?: A Little Help needed moving to and from a bed to a chair (including a wheelchair)?: A Little Help needed standing up from a chair using your arms (e.g., wheelchair or bedside chair)?: A Little Help needed to walk in hospital room?: A Little Help needed climbing 3-5 steps with a railing? : A Lot  6 Click Score: 17    End of Session Equipment Utilized During Treatment: Gait belt Activity Tolerance: Patient limited by pain Patient left: in chair;with call bell/phone within reach;with chair alarm set (ice applied) Nurse Communication: Mobility status PT Visit Diagnosis: Unsteadiness on feet (R26.81);Muscle weakness (generalized) (M62.81);Pain Pain - Right/Left: Left Pain - part of body: Knee    Time: [id number] PT Time Calculation (min) (ACUTE ONLY): 34 min   Charges:   PT Evaluation $PT Eval Moderate Complexity: 1 Mod PT Treatments $Therapeutic Activity: 8-22 mins       Ramond Dial 05/14/2022, 1:14 PM  Mee Hives, PT PhD Acute Rehab  Dept. Number: [place]

## 2022-05-14 NOTE — Plan of Care (Signed)

## 2022-05-14 NOTE — Progress Notes (Signed)
Physical Therapy Treatment Patient Details Name: Erica Frank MRN: [id number] DOB: 04-18-1941 Today's Date: 05/14/2022   History of Present Illness 81 yo female with onset of significant pain in L knee since sx November 2022 was admitted 6/8 for revision of TKA.  Pt received arthrotomy and synovectomy and polyliner exchange on L knee, with plan to follow up with home therapy.  PMHx:  HTN, MI, A-fib, CAD, L lumbar laminectomy, atherosclerosis, angina, HTN,    PT Comments    Pt was seen for mobility to get up to side of bed and BSC, then to take a few steps and review her exercises on handout for TKA.  Pt is really concerned about pain and did discuss with her the reality of a second day from surgery.  Pt was reassured tomorrow is likely to be better, and encouraged her to ask for meds and replace the ice on L knee as needed.  Follow up with her tomorrow for hallway walk, to review more exercises from TKA routine and to encourage her to ask questions about how she is doing.  Pt is very much improved from AM, and will continue to work on the acute PT goals as are outlined in [place].   Recommendations for follow up therapy are one component of a multi-disciplinary discharge planning process, led by the attending physician.  Recommendations may be updated based on patient status, additional functional criteria and insurance authorization.  Follow Up Recommendations  Follow physician's recommendations for discharge plan and follow up therapies     Assistance Recommended at Discharge Intermittent Supervision/Assistance  Patient can return home with the following A little help with walking and/or transfers;A little help with bathing/dressing/bathroom;Assistance with cooking/housework;Assist for transportation;Help with stairs or ramp for entrance   Equipment Recommendations  None recommended by PT    Recommendations for Other Services       Precautions / Restrictions Precautions Precautions:  Knee;Fall Precaution Booklet Issued: Yes (comment) Precaution Comments: verbally reviewed information Required Braces or Orthoses: Knee Immobilizer - Left Knee Immobilizer - Left: On when out of bed or walking Restrictions Weight Bearing Restrictions: Yes LLE Weight Bearing: Weight bearing as tolerated     Mobility  Bed Mobility Overal bed mobility: Needs Assistance Bed Mobility: Supine to Sit     Supine to sit: Min assist     General bed mobility comments: minor help to move LLE and support trunk    Transfers Overall transfer level: Needs assistance Equipment used: Rolling walker (2 wheels), 1 person hand held assist Transfers: Sit to/from Stand Sit to Stand: Mod assist           General transfer comment: mod to come up to stand and by end of session was min assist    Ambulation/Gait Ambulation/Gait assistance: Min assist Gait Distance (Feet): 4 Feet Assistive device: Rolling walker (2 wheels), 1 person hand held assist Gait Pattern/deviations: Step-to pattern, Decreased stride length, Wide base of support (LLE is lagging a bit with walker) Gait velocity: reduced Gait velocity interpretation: <1.31 ft/sec, indicative of household ambulator Pre-gait activities: standing balance correction wtih increased WB on LLE     Stairs             Wheelchair Mobility    Modified Rankin (Stroke Patients Only)       Balance Overall balance assessment: Needs assistance Sitting-balance support: Feet supported Sitting balance-Leahy Scale: Fair     Standing balance support: Bilateral upper extremity supported, During functional activity Standing balance-Leahy Scale: Poor Standing balance  comment: pt is using walker for WB support but getting benefit from it to move and reduce L knee pressure                            Cognition Arousal/Alertness: Awake/alert Behavior During Therapy: WFL for tasks assessed/performed Overall Cognitive Status: Within  Functional Limits for tasks assessed                                 General Comments: pt is motivated in spite of the pain        Exercises Total Joint Exercises Ankle Circles/Pumps: AROM, 5 reps Quad Sets: AROM, 10 reps Gluteal Sets: AROM, 10 reps    General Comments General comments (skin integrity, edema, etc.): assisted to [facility] and then to bed for her exercises, with pt demonstrating a better awareness of her L knee and how to control pain      Pertinent Vitals/Pain Pain Assessment Pain Assessment: Faces Faces Pain Scale: Hurts whole lot Pain Location: L knee Pain Descriptors / Indicators: Operative site guarding, Grimacing Pain Intervention(s): Limited activity within patient's tolerance, Monitored during session, Premedicated before session, Repositioned, Ice applied    Home Living                          Prior Function            PT Goals (current goals can now be found in the care plan section) Acute Rehab PT Goals Patient Stated Goal: to get pain under control on L knee PT Goal Formulation: With patient Time For Goal Achievement: 05/28/22 Potential to Achieve Goals: Good    Frequency    7X/week      PT Plan      Co-evaluation              AM-PAC PT "6 Clicks" Mobility   Outcome Measure  Help needed turning from your back to your side while in a flat bed without using bedrails?: A Little Help needed moving from lying on your back to sitting on the side of a flat bed without using bedrails?: A Little Help needed moving to and from a bed to a chair (including a wheelchair)?: A Little Help needed standing up from a chair using your arms (e.g., wheelchair or bedside chair)?: A Little Help needed to walk in hospital room?: A Little Help needed climbing 3-5 steps with a railing? : A Lot 6 Click Score: 17    End of Session Equipment Utilized During Treatment: Gait belt Activity Tolerance: Patient limited by pain Patient  left: with call bell/phone within reach;in bed;with bed alarm set (ice applied) Nurse Communication: Mobility status PT Visit Diagnosis: Unsteadiness on feet (R26.81);Muscle weakness (generalized) (M62.81);Pain Pain - Right/Left: Left Pain - part of body: Knee     Time: [id number] PT Time Calculation (min) (ACUTE ONLY): 36 min  Charges:  $Therapeutic Exercise: 8-22 mins $Therapeutic Activity: 8-22 mins Ramond Dial 05/14/2022, 8:59 PM  Mee Hives, PT PhD Acute Rehab Dept. Number: [place] and [place]

## 2022-05-14 NOTE — Discharge Instructions (Signed)
You may put all of your weight on your left leg as comfort allows. Do work on straightening and bending your knee throughout the day. Ice your knee intermittently throughout the day. The current dressing on your knee can get wet in the shower and can stay off for the next 2 weeks.

## 2022-05-14 NOTE — TOC Initial Note (Signed)
Transition of Care [facility]) - Initial/Assessment Note    Patient Details  Name: Erica Frank MRN: [id number] Date of Birth: [birth year]  [facility] CM/SW Contact:    Epifanio Lesches, RN Phone Number: 05/14/2022, 12:28 PM  Clinical Narrative:                   S/p  Left knee arthrotomy with synovectomy and poly liner exchange/upsizing of poly liner, 6/8  From home alone. PTA independent with ADL's. States already has RW, W/C, crutches @ home. States nurse daughter to assist with care once d/c. Pt agreeable to home health services. No preference with provider as long as they are in network. Referral made with [facility] and accepted.  Pt states will have transportation to home once d/c.  TOC team will continue to monitor and assist with needs...  Expected Discharge Plan: Home w Home Health Services Barriers to Discharge: Continued Medical Work up   Patient Goals and CMS Choice     Choice offered to / list presented to : Patient  Expected Discharge Plan and Services Expected Discharge Plan: Home w Home Health Services   Discharge Planning Services: CM Consult   Living arrangements for the past 2 months: Single Family Home                           HH Arranged: PT [facility] Agency: [facility] Date HH Agency Contacted: 05/14/22 Time HH Agency Contacted: [phone] Representative spoke with at [facility] Agency: [place]  Prior Living Arrangements/Services Living arrangements for the past 2 months: Single Family Home Lives with:: Self Patient language and need for interpreter reviewed:: Yes Do you feel safe going back to the place where you live?: Yes      Need for Family Participation in Patient Care: Yes (Comment) Care giver support system in place?: Yes (comment) Current home services: DME (W/C, RW, crutches) Criminal Activity/Legal Involvement Pertinent to Current Situation/Hospitalization: No - Comment as needed  Activities of  Daily Living Home Assistive Devices/Equipment: None ADL Screening (condition at time of admission) Patient's cognitive ability adequate to safely complete daily activities?: Yes Is the patient deaf or have difficulty hearing?: No Does the patient have difficulty seeing, even when wearing glasses/contacts?: No Does the patient have difficulty concentrating, remembering, or making decisions?: No Patient able to express need for assistance with ADLs?: Yes Does the patient have difficulty dressing or bathing?: No Independently performs ADLs?: Yes (appropriate for developmental age) Does the patient have difficulty walking or climbing stairs?: Yes Weakness of Legs: Left Weakness of Arms/Hands: None  Permission Sought/Granted                  Emotional Assessment Appearance:: Appears stated age Attitude/Demeanor/Rapport: Engaged Affect (typically observed): Accepting Orientation: : Oriented to Self, Oriented to Place, Oriented to  Time, Oriented to Situation Alcohol / Substance Use: Not Applicable Psych Involvement: No (comment)  Admission diagnosis:  Status post revision of total replacement of left knee [Z96.652] Patient Active Problem List   Diagnosis Date Noted   Painful total knee replacement, left (HCC) 05/13/2022   Status post revision of total replacement of left knee 05/13/2022   Degenerative lumbar spinal stenosis 02/02/2022   S/P left TKA 10/07/2020   Left knee OA 10/07/2020   Syncope 01/16/2020   Anemia 01/16/2020   Thrombocytopenia (HCC) 01/16/2020   CAD (coronary artery disease) 01/16/2020   Elevated troponin 07/09/2019   NSTEMI (  non-ST elevated myocardial infarction) (HCC) 07/09/2019   Paroxysmal atrial fibrillation (HCC) 02/15/2019   Mixed hyperlipidemia 02/15/2019   Bradycardia 02/15/2019   Coronary artery disease involving native coronary artery of native heart with unstable angina pectoris (HCC) 12/20/2018   Dyslipidemia 12/20/2018   Essential  hypertension 12/20/2018   PCP:  Chilton Greathouse, MD Pharmacy:   [facility] [phone] - [place], [place] - [address] AT [facility] OF [address] & [address] [place] [address] Phone: [phone] Fax: [phone]     Social Determinants of Health (SDOH) Interventions    Readmission Risk Interventions     No data to display

## 2022-05-15 NOTE — Plan of Care (Signed)

## 2022-05-15 NOTE — Progress Notes (Signed)
Physical Therapy Treatment Patient Details Name: Erica Frank MRN: [id number] DOB: [birth year] Today's Date: 05/15/2022   History of Present Illness 81 yo female with onset of significant pain in L knee since sx November 2022 was admitted 6/8 for revision of TKA.  Pt received arthrotomy and synovectomy and polyliner exchange on L knee, with plan to follow up with home therapy.  PMHx:  HTN, MI, A-fib, CAD, L lumbar laminectomy, atherosclerosis, angina, HTN,    PT Comments    Pt was seen for mobility on RW to get up from side of bed and walkin her room.  Practicing BSC transfers with less help today, and pt is getting up to side of bed with better effort.  Pain continues to plague her but pt is determined to think about her goal to get home.  Has reiterated commitment to avoid SNF care, and will follow up with all goals of PT POC. Continue to work on ROM to L knee as tolerated.   Recommendations for follow up therapy are one component of a multi-disciplinary discharge planning process, led by the attending physician.  Recommendations may be updated based on patient status, additional functional criteria and insurance authorization.  Follow Up Recommendations  Follow physician's recommendations for discharge plan and follow up therapies     Assistance Recommended at Discharge Intermittent Supervision/Assistance  Patient can return home with the following A little help with bathing/dressing/bathroom;A lot of help with walking and/or transfers;Assistance with cooking/housework;Assist for transportation;Help with stairs or ramp for entrance   Equipment Recommendations  None recommended by PT    Recommendations for Other Services       Precautions / Restrictions Precautions Precautions: Knee;Fall Precaution Booklet Issued: Yes (comment) Restrictions Weight Bearing Restrictions: Yes LLE Weight Bearing: Weight bearing as tolerated     Mobility  Bed Mobility Overal bed mobility: Needs  Assistance Bed Mobility: Supine to Sit     Supine to sit: Min assist     General bed mobility comments: pt did the majority of the work, was distracted by pain    Transfers Overall transfer level: Needs assistance Equipment used: Rolling walker (2 wheels), 1 person hand held assist Transfers: Sit to/from Stand Sit to Stand: Mod assist           General transfer comment: mod to power up and control balance    Ambulation/Gait Ambulation/Gait assistance: Min assist Gait Distance (Feet): 40 Feet Assistive device: Rolling walker (2 wheels), 1 person hand held assist Gait Pattern/deviations: Step-to pattern, Step-through pattern, Decreased stance time - left, Decreased weight shift to left Gait velocity: reduced Gait velocity interpretation: <1.31 ft/sec, indicative of household ambulator Pre-gait activities: standing balance and posture General Gait Details: controlling wb on LLe with all tasks, but more stable in standing with immobilizer on   Stairs             Wheelchair Mobility    Modified Rankin (Stroke Patients Only)       Balance Overall balance assessment: Needs assistance Sitting-balance support: Feet supported Sitting balance-Leahy Scale: Fair     Standing balance support: Bilateral upper extremity supported, During functional activity Standing balance-Leahy Scale: Poor                              Cognition Arousal/Alertness: Awake/alert Behavior During Therapy: WFL for tasks assessed/performed Overall Cognitive Status: No family/caregiver present to determine baseline cognitive functioning  General Comments: distracted but in a lot of pain        Exercises Total Joint Exercises Ankle Circles/Pumps: AROM, 5 reps Quad Sets: AROM, 10 reps Hip ABduction/ADduction: AAROM Straight Leg Raises: AAROM, 10 reps    General Comments General comments (skin integrity, edema, etc.): pt has  clean bandage on L knee with ice in place, removed for application of brace and walk/transfer training, replaced with ice packs at end of session      Pertinent Vitals/Pain Pain Assessment Pain Assessment: Faces Faces Pain Scale: Hurts whole lot Pain Location: L knee Pain Descriptors / Indicators: Grimacing, Guarding Pain Intervention(s): Limited activity within patient's tolerance, Monitored during session, Premedicated before session, Repositioned, Ice applied    Home Living                          Prior Function            PT Goals (current goals can now be found in the care plan section) Acute Rehab PT Goals Patient Stated Goal: to get pain under control on L knee Progress towards PT goals: Progressing toward goals    Frequency    7X/week      PT Plan Current plan remains appropriate    Co-evaluation              AM-PAC PT "6 Clicks" Mobility   Outcome Measure  Help needed turning from your back to your side while in a flat bed without using bedrails?: A Little Help needed moving from lying on your back to sitting on the side of a flat bed without using bedrails?: A Little Help needed moving to and from a bed to a chair (including a wheelchair)?: A Little Help needed standing up from a chair using your arms (e.g., wheelchair or bedside chair)?: A Lot Help needed to walk in hospital room?: A Little Help needed climbing 3-5 steps with a railing? : A Lot 6 Click Score: 16    End of Session Equipment Utilized During Treatment: Gait belt Activity Tolerance: Patient limited by pain Patient left: in chair;with call bell/phone within reach;with chair alarm set Nurse Communication: Mobility status PT Visit Diagnosis: Unsteadiness on feet (R26.81);Muscle weakness (generalized) (M62.81);Pain Pain - Right/Left: Left Pain - part of body: Knee     Time: [id number] PT Time Calculation (min) (ACUTE ONLY): 38 min  Charges:  $Gait Training: 8-22  mins $Therapeutic Exercise: 8-22 mins $Therapeutic Activity: 8-22 mins       Ramond Dial 05/15/2022, 5:46 PM  Mee Hives, PT PhD Acute Rehab Dept. Number: [place] and [place]

## 2022-05-15 NOTE — Plan of Care (Signed)

## 2022-05-15 NOTE — Progress Notes (Signed)
Physical Therapy Treatment Patient Details Name: Erica Frank MRN: [id number] DOB: 01-07-1941 Today's Date: 05/15/2022   History of Present Illness 81 yo female with onset of significant pain in L knee since sx November 2022 was admitted 6/8 for revision of TKA.  Pt received arthrotomy and synovectomy and polyliner exchange on L knee, with plan to follow up with home therapy.  PMHx:  HTN, MI, A-fib, CAD, L lumbar laminectomy, atherosclerosis, angina, HTN,    PT Comments    Very abbreviated session to get pt moved and repositioned on the bed as well as adjust the ice and pads on L knee.  Pt requested a bedpan instead of getting OOB, and was surprisingly good to elevate hips and roll with pt to assist the process.  Follow up with her for another gait session tomorrow, as pt is still anticipating a discharge to her daughter's home.   Recommendations for follow up therapy are one component of a multi-disciplinary discharge planning process, led by the attending physician.  Recommendations may be updated based on patient status, additional functional criteria and insurance authorization.  Follow Up Recommendations  Follow physician's recommendations for discharge plan and follow up therapies     Assistance Recommended at Discharge Intermittent Supervision/Assistance  Patient can return home with the following A little help with bathing/dressing/bathroom;A lot of help with walking and/or transfers;Assistance with cooking/housework;Assist for transportation;Help with stairs or ramp for entrance   Equipment Recommendations  None recommended by PT    Recommendations for Other Services       Precautions / Restrictions Precautions Precautions: Knee;Fall Precaution Booklet Issued: Yes (comment) Restrictions Weight Bearing Restrictions: Yes LLE Weight Bearing: Weight bearing as tolerated     Mobility  Bed Mobility Overal bed mobility: Needs Assistance Bed Mobility: Rolling      Supine to sit: Min assist     General bed mobility comments: pt is demonstrating bridging with no assist other than cues    Transfers Overall transfer level: Needs assistance Equipment used: Rolling walker (2 wheels), 1 person hand held assist Transfers: Sit to/from Stand Sit to Stand: Mod assist           General transfer comment: declines OOB    Ambulation/Gait Ambulation/Gait assistance: Min assist Gait Distance (Feet): 40 Feet Assistive device: Rolling walker (2 wheels), 1 person hand held assist Gait Pattern/deviations: Step-to pattern, Step-through pattern, Decreased stance time - left, Decreased weight shift to left Gait velocity: reduced Gait velocity interpretation: <1.31 ft/sec, indicative of household ambulator Pre-gait activities: standing balance and posture General Gait Details: controlling wb on LLe with all tasks, but more stable in standing with immobilizer on   Stairs             Wheelchair Mobility    Modified Rankin (Stroke Patients Only)       Balance Overall balance assessment: Needs assistance Sitting-balance support: Feet supported Sitting balance-Leahy Scale: Fair     Standing balance support: Bilateral upper extremity supported, During functional activity Standing balance-Leahy Scale: Poor                              Cognition Arousal/Alertness: Awake/alert Behavior During Therapy: WFL for tasks assessed/performed Overall Cognitive Status: No family/caregiver present to determine baseline cognitive functioning                                 General Comments:  distracted but in a lot of pain        Exercises Total Joint Exercises Ankle Circles/Pumps: AROM, 5 reps Quad Sets: AROM, 10 reps Hip ABduction/ADduction: AAROM Straight Leg Raises: AAROM, 10 reps    General Comments General comments (skin integrity, edema, etc.): pt has clean bandage on L knee with ice in place, removed for  application of brace and walk/transfer training, replaced with ice packs at end of session      Pertinent Vitals/Pain Pain Assessment Pain Assessment: Faces Faces Pain Scale: Hurts whole lot Pain Location: L knee Pain Descriptors / Indicators: Grimacing, Guarding Pain Intervention(s): Limited activity within patient's tolerance, Monitored during session, Premedicated before session, Repositioned, Ice applied    Home Living                          Prior Function            PT Goals (current goals can now be found in the care plan section) Acute Rehab PT Goals Patient Stated Goal: to get pain under control on L knee Progress towards PT goals: Not progressing toward goals - comment    Frequency    7X/week      PT Plan Current plan remains appropriate    Co-evaluation              AM-PAC PT "6 Clicks" Mobility   Outcome Measure  Help needed turning from your back to your side while in a flat bed without using bedrails?: A Little Help needed moving from lying on your back to sitting on the side of a flat bed without using bedrails?: A Little Help needed moving to and from a bed to a chair (including a wheelchair)?: A Little Help needed standing up from a chair using your arms (e.g., wheelchair or bedside chair)?: A Lot Help needed to walk in hospital room?: A Little Help needed climbing 3-5 steps with a railing? : A Lot 6 Click Score: 16    End of Session Equipment Utilized During Treatment: Gait belt Activity Tolerance: Patient limited by pain;Patient limited by fatigue Patient left: in bed;with call bell/phone within reach;with bed alarm set Nurse Communication: Mobility status PT Visit Diagnosis: Unsteadiness on feet (R26.81);Muscle weakness (generalized) (M62.81);Pain Pain - Right/Left: Left Pain - part of body: Knee     Time: [id number] PT Time Calculation (min) (ACUTE ONLY): 10 min  Charges:  $Gait Training: 8-22 mins $Therapeutic  Exercise: 8-22 mins $Therapeutic Activity: 8-22 mins        Ramond Dial 05/15/2022, 5:51 PM  Mee Hives, PT PhD Acute Rehab Dept. Number: [place] and [place]

## 2022-05-15 NOTE — Progress Notes (Signed)
Patient ID: Erica Frank, female   DOB: [birth year], 81 y.o.   MRN: [id number] Continues to struggle with pain.  She did get some sleep overnight but this morning she was woken up and had a significant amount of pain.  She is attempted to mobilize with physical therapy although this has been limited due to her pain.  She is hoping to go home today and her daughter is a Engineer, civil (consulting) and can help she does not want to overburden her.  On examination compartments remain soft and her distal neurosensory exam is intact.  2+ dorsalis pedis pulse.  Dressing is clean dry intact.  We will plan to manage her pain with multimodal pain protocol and hopefully mobilize in an effort to discharge later today or tomorrow.

## 2022-05-16 MED ORDER — CYCLOBENZAPRINE HCL 5 MG PO TABS
5.0000 mg | ORAL_TABLET | Freq: Three times a day (TID) | ORAL | Status: DC | PRN
Start: 2022-05-16 — End: 2022-05-18
  Administered 2022-05-16 – 2022-05-17 (×4): 5 mg via ORAL
  Filled 2022-05-16 (×4): qty 1

## 2022-05-16 NOTE — Progress Notes (Signed)
Physical Therapy Treatment Patient Details Name: Erica Frank MRN: [id number] DOB: Apr 12, 1941 Today's Date: 05/16/2022   History of Present Illness 81 yo female with onset of significant pain in L knee since sx November 2022 was admitted 6/8 for revision of TKA.  Pt received arthrotomy and synovectomy and polyliner exchange on L knee, with plan to follow up with home therapy.  PMHx:  HTN, MI, A-fib, CAD, L lumbar laminectomy, atherosclerosis, angina, HTN,    PT Comments    Continuing work on functional mobility and activity tolerance;  Session focused on therex for L knee extensor activation and on flexion and extension ROM and functional transfers; Very painful, but participating well;  Once up to EOB, pt needed to use the [facility]; assisted with sit to stand transfer and pivotal steps bed to [facility]; Pt still quite painful in standing and needed up to mod assist to steady, and shift center of mass forward over feet;   She is concerned about managing at home at her current functional level and is interested in going to SNF for post-acute rehab to maximize independence and safety with mobility and ADLs prior to getting home; PT in agreement  Recommendations for follow up therapy are one component of a multi-disciplinary discharge planning process, led by the attending physician.  Recommendations may be updated based on patient status, additional functional criteria and insurance authorization.  Follow Up Recommendations  Skilled nursing-short term rehab (<3 hours/day)     Assistance Recommended at Discharge Intermittent Supervision/Assistance  Patient can return home with the following A little help with bathing/dressing/bathroom;A lot of help with walking and/or transfers;Assistance with cooking/housework;Assist for transportation;Help with stairs or ramp for entrance   Equipment Recommendations  Rolling walker (2 wheels);BSC/3in1 (Pt may already have)    Recommendations for Other Services        Precautions / Restrictions Precautions Precautions: Knee;Fall Precaution Booklet Issued: Yes (comment) Precaution Comments: verbally reviewed information Required Braces or Orthoses: Knee Immobilizer - Left Knee Immobilizer - Left: On when out of bed or walking Restrictions LLE Weight Bearing: Weight bearing as tolerated     Mobility  Bed Mobility Overal bed mobility: Needs Assistance Bed Mobility: Supine to Sit     Supine to sit: Min assist     General bed mobility comments: Slow moving, but able to move LEs to EOB and push up on elbows without assist; min assist to square off hips at EOB once sitting    Transfers Overall transfer level: Needs assistance Equipment used: Rolling walker (2 wheels), 1 person hand held assist Transfers: Sit to/from Stand Sit to Stand: Mod assist           General transfer comment: Mod assist to rise and stabilize; multimodal cues to shift weight anteriorly over feet; Increased time to find balance    Ambulation/Gait Ambulation/Gait assistance: Min assist Gait Distance (Feet):  (pivotal steps bed to recliner) Assistive device: Rolling walker (2 wheels)         General Gait Details: Lots of encouragement to use BSC instead of bedpan; small steps bed to [facility] with KI on   Stairs             Wheelchair Mobility    Modified Rankin (Stroke Patients Only)       Balance     Sitting balance-Leahy Scale: Fair       Standing balance-Leahy Scale: Poor  Cognition Arousal/Alertness: Awake/alert Behavior During Therapy: WFL for tasks assessed/performed Overall Cognitive Status: No family/caregiver present to determine baseline cognitive functioning                                 General Comments: distracted but in a lot of pain        Exercises Total Joint Exercises Quad Sets: AROM, 10 reps Short Arc Quad: AAROM, Left, 10 reps Heel Slides: AAROM, Left (3  reps)    General Comments        Pertinent Vitals/Pain Pain Assessment Pain Assessment: Faces Faces Pain Scale: Hurts whole lot Pain Location: L knee Pain Descriptors / Indicators: Grimacing, Guarding Pain Intervention(s): Limited activity within patient's tolerance    Home Living                          Prior Function            PT Goals (current goals can now be found in the care plan section) Acute Rehab PT Goals Patient Stated Goal: to get pain under control on L knee PT Goal Formulation: With patient Time For Goal Achievement: 05/28/22 Potential to Achieve Goals: Good Progress towards PT goals: Progressing toward goals (slowly)    Frequency    7X/week      PT Plan Discharge plan needs to be updated    Co-evaluation              AM-PAC PT "6 Clicks" Mobility   Outcome Measure  Help needed turning from your back to your side while in a flat bed without using bedrails?: A Little Help needed moving from lying on your back to sitting on the side of a flat bed without using bedrails?: A Little Help needed moving to and from a bed to a chair (including a wheelchair)?: A Lot Help needed standing up from a chair using your arms (e.g., wheelchair or bedside chair)?: A Lot Help needed to walk in hospital room?: A Lot Help needed climbing 3-5 steps with a railing? : A Lot 6 Click Score: 14    End of Session Equipment Utilized During Treatment: Gait belt;Left knee immobilizer Activity Tolerance: Patient limited by pain;Patient limited by fatigue Patient left: with call bell/phone within reach;with nursing/sitter in room (On [facility]) Nurse Communication: Mobility status PT Visit Diagnosis: Unsteadiness on feet (R26.81);Muscle weakness (generalized) (M62.81);Pain Pain - Right/Left: Left Pain - part of body: Knee     Time: [id number] PT Time Calculation (min) (ACUTE ONLY): 35 min  Charges:  $Gait Training: 8-22 mins $Therapeutic Exercise: 8-22  mins                     Roney Marion, PT  Acute Rehabilitation Services Office [place] 05/16/2022, 1:48 PM

## 2022-05-16 NOTE — Progress Notes (Signed)
Physical Therapy Treatment Patient Details Name: Erica Frank MRN: [id number] DOB: 1941/11/26 Today's Date: 05/16/2022   History of Present Illness 81 yo female with onset of significant pain in L knee since sx November 2022 was admitted 6/8 for revision of TKA.  Pt received arthrotomy and synovectomy and polyliner exchange on L knee, with plan to follow up with home therapy.  PMHx:  HTN, MI, A-fib, CAD, L lumbar laminectomy, atherosclerosis, angina, HTN,    PT Comments    Continuing work on functional mobility and activity tolerance;  session focused on progressive ambulation, with good progress noted; Min/mod assist to help pt to [facility]; RN joined [place] and applied a sacral dressing, as well as a pressure redistribution cushion for her chair to help with bottom soreness and to prevent injury; Good walk in hallway; Nauseated towards end of walk -- pt and daughter did not want to request anti-nausea meds  Recommendations for follow up therapy are one component of a multi-disciplinary discharge planning process, led by the attending physician.  Recommendations may be updated based on patient status, additional functional criteria and insurance authorization.  Follow Up Recommendations  Skilled nursing-short term rehab (<3 hours/day)     Assistance Recommended at Discharge Intermittent Supervision/Assistance  Patient can return home with the following A little help with bathing/dressing/bathroom;A lot of help with walking and/or transfers;Assistance with cooking/housework;Assist for transportation;Help with stairs or ramp for entrance   Equipment Recommendations  Rolling walker (2 wheels);BSC/3in1 (Pt may already have)    Recommendations for Other Services       Precautions / Restrictions Precautions Precautions: Knee;Fall Precaution Comments: verbally reviewed information Required Braces or Orthoses: Knee Immobilizer - Left Knee Immobilizer - Left: On when out of bed or  walking Restrictions LLE Weight Bearing: Weight bearing as tolerated     Mobility  Bed Mobility Overal bed mobility: Needs Assistance Bed Mobility: Supine to Sit     Supine to sit: Min assist     General bed mobility comments: Slow moving, but able to move LEs to EOB and push up on elbows without assist; min assist to square off hips at EOB once sitting    Transfers Overall transfer level: Needs assistance Equipment used: Rolling walker (2 wheels), 1 person hand held assist Transfers: Sit to/from Stand Sit to Stand: Min assist           General transfer comment: Min assist to stabilize once standing to allwo for pt to switch hand from bed to RW; first got bed to [facility], tehn stood from [facility]    Ambulation/Gait Ambulation/Gait assistance: Herbalist (Feet): 60 Feet Assistive device: Rolling walker (2 wheels) Gait Pattern/deviations: Step-through pattern (emerging) Gait velocity: reduced     General Gait Details: Cues for increasing step length and width; REceptive to cues and did correct, but also very nervous and would revert back to very short steps; chair follow to boost pt's confidence and incr steps and distance   Stairs             Wheelchair Mobility    Modified Rankin (Stroke Patients Only)       Balance     Sitting balance-Leahy Scale: Fair       Standing balance-Leahy Scale: Poor                              Cognition Arousal/Alertness: Awake/alert Behavior During Therapy: WFL for tasks assessed/performed Overall Cognitive Status: Within  Functional Limits for tasks assessed                                 General Comments: distracted but in a lot of pain        Exercises Total Joint Exercises Quad Sets: AROM, 10 reps Short Arc Quad: AAROM, Left, 10 reps Heel Slides: AAROM, Left (3 reps)    General Comments General comments (skin integrity, edema, etc.): Pt's daughter, Erica Frank, present and  helpful during session      Pertinent Vitals/Pain Pain Assessment Pain Assessment: Faces Faces Pain Scale: Hurts even more Pain Location: L knee Pain Descriptors / Indicators: Grimacing, Guarding Pain Intervention(s): Premedicated before session    Home Living                          Prior Function            PT Goals (current goals can now be found in the care plan section) Acute Rehab PT Goals Patient Stated Goal: to get pain under control on L knee PT Goal Formulation: With patient Time For Goal Achievement: 05/28/22 Potential to Achieve Goals: Good Progress towards PT goals: Progressing toward goals    Frequency    7X/week      PT Plan Current plan remains appropriate    Co-evaluation              AM-PAC PT "6 Clicks" Mobility   Outcome Measure  Help needed turning from your back to your side while in a flat bed without using bedrails?: A Little Help needed moving from lying on your back to sitting on the side of a flat bed without using bedrails?: A Little Help needed moving to and from a bed to a chair (including a wheelchair)?: A Lot Help needed standing up from a chair using your arms (e.g., wheelchair or bedside chair)?: A Lot Help needed to walk in hospital room?: A Lot Help needed climbing 3-5 steps with a railing? : A Lot 6 Click Score: 14    End of Session Equipment Utilized During Treatment: Gait belt;Left knee immobilizer Activity Tolerance: Patient tolerated treatment well Patient left: in chair;with call bell/phone within reach;with family/visitor present Nurse Communication: Mobility status PT Visit Diagnosis: Unsteadiness on feet (R26.81);Muscle weakness (generalized) (M62.81);Pain Pain - Right/Left: Left Pain - part of body: Knee     Time: [id number] PT Time Calculation (min) (ACUTE ONLY): 35 min  Charges:  $Gait Training: 23-37 mins                     Roney Marion, [place] Office  [phone]    Colletta [place] 05/16/2022, 6:27 PM

## 2022-05-16 NOTE — Progress Notes (Signed)
Patient ID: Erica Frank, female   DOB: [birth year], 81 y.o.   MRN: [id number] Continues to struggle with pain.  She is hoping to go home today and her daughter is a Marine scientist and can help she does not want to overburden her.  On examination compartments remain soft and her distal neurosensory exam is intact.  2+ dorsalis pedis pulse.  Dressing is clean dry intact. Will plan to have her work with PT today, if unable to mobilize safely may require DC to SNF

## 2022-05-17 ENCOUNTER — Telehealth: Payer: Self-pay | Admitting: Orthopaedic Surgery

## 2022-05-17 NOTE — Care Management Important Message (Signed)
Important Message  Patient Details  Name: Erica Frank MRN: [id number] Date of Birth: [birth year]   Medicare Important Message Given:  Yes     Tajee Savant Stefan Church 05/17/2022, 4:10 PM

## 2022-05-17 NOTE — Discharge Summary (Signed)
Patient ID: Erica Frank MRN: [id number] DOB/AGE: [birth year] 81 y.o.  Admit date: 05/13/2022 Discharge date: 05/17/2022  Admission Diagnoses:  Principal Problem:   Painful total knee replacement, left ([place]) Active Problems:   Status post revision of total replacement of left knee   Discharge Diagnoses:  Same  Past Medical History:  Diagnosis Date   Anginal pain ([place]) 1989   Anxiety    Arthritis    Atherosclerosis    Atrial fibrillation ([place])    Coronary artery disease    Dysrhythmia    a-fib   Elevated liver enzymes    GERD (gastroesophageal reflux disease)    Heart attack ([place]) 2020   Heart disease    High cholesterol    Hypertension     Surgeries: Procedure(s): SYNOVECTOMY AND LEFT KNEE POLY-LINER EXCHANGE on 05/13/2022   Consultants:   Discharged Condition: Improved  Hospital Course: Erica Frank is an 81 y.o. female who was admitted 05/13/2022 for operative treatment ofPainful total knee replacement, left ([place]). Patient has severe unremitting pain that affects sleep, daily activities, and work/hobbies. After pre-op clearance the patient was taken to the operating room on 05/13/2022 and underwent  Procedure(s): SYNOVECTOMY AND LEFT KNEE POLY-LINER EXCHANGE.    Patient was given perioperative antibiotics:  Anti-infectives (From admission, onward)    Start     Dose/Rate Route Frequency Ordered Stop   05/13/22 1530  ceFAZolin (ANCEF) IVPB 1 g/50 mL premix        1 g 100 mL/hr over 30 Minutes Intravenous Every 6 hours 05/13/22 1334 05/15/22 0117   05/13/22 0745  ceFAZolin (ANCEF) IVPB 2g/100 mL premix        2 g 200 mL/hr over 30 Minutes Intravenous On call to O.R. 05/13/22 [id number] 05/13/22 0929   05/13/22 0736  ceFAZolin (ANCEF) 2-4 GM/100ML-% IVPB       Note to Pharmacy: Alphonsus Sias: cabinet override      05/13/22 0736 05/13/22 0930        Patient was given sequential compression devices, early ambulation, and chemoprophylaxis to prevent  DVT.  Patient benefited maximally from hospital stay and there were no complications.    Recent vital signs: Patient Vitals for the past 24 hrs:  BP Temp Temp src Pulse Resp SpO2  05/17/22 1415 110/63 98 F (36.7 C) -- 74 18 97 %  05/17/22 0752 (!) 118/59 (!) 97.5 F (36.4 C) Oral 70 17 95 %  05/17/22 0007 (!) 117/49 98.4 F (36.9 C) Oral 82 -- 96 %  05/16/22 2030 101/64 98.1 F (36.7 C) Oral 79 16 95 %     Recent laboratory studies: No results for input(s): "WBC", "HGB", "HCT", "PLT", "NA", "K", "CL", "CO2", "BUN", "CREATININE", "GLUCOSE", "INR", "CALCIUM" in the last 72 hours.  Invalid input(s): "PT", "2"   Discharge Medications:   Allergies as of 05/17/2022       Reactions   Hydrocodone    "Makes her feel crazy" Depressed   Repatha [evolocumab] Diarrhea        Medication List     STOP taking these medications    aspirin EC 81 MG tablet Replaced by: aspirin 81 MG chewable tablet       TAKE these medications    acetaminophen 500 MG tablet Commonly known as: TYLENOL Take 1,000 mg by mouth daily as needed (pain).   allopurinol 100 MG tablet Commonly known as: ZYLOPRIM Take 100 mg by mouth daily.   aspirin 81 MG chewable tablet Chew 1 tablet (81 mg total)  by mouth 2 (two) times daily. Replaces: aspirin EC 81 MG tablet   BIOTIN PO Take 1 tablet by mouth daily. Hair, skin & nails   CALCIUM PO Take 1 tablet by mouth daily.   DULoxetine 60 MG capsule Commonly known as: CYMBALTA Take 60 mg by mouth See admin instructions. Take 60 mg by mouth daily PRN for knee pain   ezetimibe-simvastatin 10-40 MG tablet Commonly known as: VYTORIN TAKE 1 TABLET BY MOUTH DAILY What changed: when to take this   isosorbide mononitrate 60 MG 24 hr tablet Commonly known as: IMDUR TAKE 1 TABLET(60 MG) BY MOUTH DAILY What changed: See the new instructions.   lisinopril 5 MG tablet Commonly known as: ZESTRIL TAKE 1 TABLET(5 MG) BY MOUTH DAILY What changed: See the new  instructions.   methocarbamol 500 MG tablet Commonly known as: ROBAXIN Take 1 tablet (500 mg total) by mouth every 6 (six) hours as needed for muscle spasms.   nitroGLYCERIN 0.4 MG SL tablet Commonly known as: NITROSTAT Place 1 tablet (0.4 mg total) under the tongue every 5 (five) minutes as needed for chest pain.   oxyCODONE 5 MG immediate release tablet Commonly known as: Oxy IR/ROXICODONE Take 1-2 tablets (5-10 mg total) by mouth every 4 (four) hours as needed for moderate pain (pain score 4-6).   Praluent 75 MG/ML So[place] Generic drug: Alirocumab Inject 75 mg into the skin every 14 (fourteen) days.   Refresh 1.4-0.6 % Soln Generic drug: Polyvinyl Alcohol-Povidone PF Place 1 drop into both eyes daily as needed (Dry eyes).   traMADol 50 MG tablet Commonly known as: ULTRAM Take 100 mg by mouth daily.   VITAMIN B-12 PO Take 1 tablet by mouth See admin instructions. Take 1 tablet by mouth daily when able to remember               Durable Medical Equipment  (From admission, onward)           Start     Ordered   05/13/22 1335  DME 3 n 1  Once        05/13/22 1334   05/13/22 1335  DME Walker rolling  Once       Question Answer Comment  Walker: With 5 Inch Wheels   Patient needs a walker to treat with the following condition Status post revision of total knee replacement, left      05/13/22 1334            Diagnostic Studies: PCV CAROTID DUPLEX (BILATERAL)  Result Date: 05/09/2022 Carotid artery duplex 05/06/2022: Duplex suggests stenosis in the right internal carotid artery (16-49%). Duplex suggests stenosis in the right external carotid artery (<50%). Duplex suggests stenosis in the left internal carotid artery (50-69%), upper end of the spectrum. Duplex suggests stenosis in the left external carotid artery (<50%). Antegrade right vertebral artery flow. Antegrade left vertebral artery flow. Compared to the study done on 11/05/2021, no significant change. Follow  up in six months is appropriate if clinically indicated.   Disposition: Discharge disposition: 01-Home or [place] Follow up.   Why: home health PT services will be provided by [facility], start of care with in 48 hours post discharge.        Erica Solian, MD Follow up.   Specialty: Internal Medicine Contact information: [address] [place] [address] [phone]  Jacolyn Reedy, MD .   Specialty: Cardiology Contact information: [place] [address] [phone]         [facility] Follow up.   Specialty: Orthopedics Contact information: [place] [id number] [phone]        Mcarthur Rossetti, MD Follow up in 2 week(s).   Specialty: Orthopedic Surgery Contact information: [address] [place] [address] [phone]                  Signed: Mcarthur Rossetti 05/17/2022, 4:41 PM

## 2022-05-17 NOTE — Telephone Encounter (Signed)
Received call from [facility] -nurse from [address] [place] stating patient asked to be discharged this afternoon. The number to contact Latoya is [phone]

## 2022-05-17 NOTE — Progress Notes (Signed)
Pt is requesting a discharge order for today.  Spoke with MDO Claris Che) will relay message to MD/staff.

## 2022-05-17 NOTE — Plan of Care (Signed)

## 2022-05-17 NOTE — Progress Notes (Signed)
Physical Therapy Treatment Patient Details Name: Erica Frank MRN: [id number] DOB: 1941/02/28 Today's Date: 05/17/2022   History of Present Illness 81 yo female with onset of significant pain in L knee since sx November 2022 was admitted 6/8 for revision of TKA.  Pt received arthrotomy and synovectomy and polyliner exchange on L knee, with plan to follow up with home therapy.  PMHx:  HTN, MI, A-fib, CAD, L lumbar laminectomy, atherosclerosis, angina, HTN,    PT Comments    Pt admitted with above diagnosis. Pt was able to progress ambulation with RW with min guard assist with cues for sequencing. Pt states that her daughter will assist her at home and wants to go home.  D/C plan updated below.  Will see pt this pm and pt will go home today or tomorrow.   Pt currently with functional limitations due to balance and endurance deficits. Pt will benefit from skilled PT to increase their independence and safety with mobility to allow discharge to the venue listed below.      Recommendations for follow up therapy are one component of a multi-disciplinary discharge planning process, led by the attending physician.  Recommendations may be updated based on patient status, additional functional criteria and insurance authorization.  Follow Up Recommendations  Home health PT     Assistance Recommended at Discharge Set up Supervision/Assistance  Patient can return home with the following A little help with bathing/dressing/bathroom;Assistance with cooking/housework;Assist for transportation;Help with stairs or ramp for entrance;A little help with walking and/or transfers   Equipment Recommendations  None recommended by PT    Recommendations for Other Services       Precautions / Restrictions Precautions Precautions: Knee;Fall Precaution Booklet Issued: Yes (comment) Precaution Comments: verbally reviewed information Knee Immobilizer - Left: Other (comment) (MD discontinued knee  immobilizer) Restrictions Weight Bearing Restrictions: Yes LLE Weight Bearing: Weight bearing as tolerated     Mobility  Bed Mobility Overal bed mobility: Needs Assistance Bed Mobility: Supine to Sit     Supine to sit: Supervision     General bed mobility comments: Slow moving, but able to move LEs to EOB and push up on elbows without assist, used gait belt to assist left LE to EOB.    Transfers Overall transfer level: Needs assistance Equipment used: Rolling walker (2 wheels), 1 person hand held assist Transfers: Sit to/from Stand, Bed to chair/wheelchair/BSC Sit to Stand: Supervision Stand pivot transfers: Min guard         General transfer comment: Pt needed incr time to rise to standing as she remained in flexed position initially upon reaching for RW.  Pt worried about urinating therefore placed 3n1 to pts left and she pivoted to the 3N1 and urinated.  Was able to clean herself on her own and then stood from 3n1 with same assist and incr time. Pt needed cues for upright posture. Was able to pivot with guard assist and cues only.    Ambulation/Gait Ambulation/Gait assistance: Min guard Gait Distance (Feet): 45 Feet Assistive device: Rolling walker (2 wheels) Gait Pattern/deviations: Step-to pattern, Decreased step length - left, Decreased stance time - left, Decreased weight shift to left, Antalgic, Trunk flexed (emerging) Gait velocity: reduced Gait velocity interpretation: 1.31 - 2.62 ft/sec, indicative of limited community ambulator   General Gait Details: Cues for increasing step length and width; REceptive to cues and was able to return demonstration throughout gait. Pt states she feels more confident with ambulation without knee immobilizer.  Pt states she will have  her family assisting her at home and she wants to go home today.   Stairs             Wheelchair Mobility    Modified Rankin (Stroke Patients Only)       Balance Overall balance  assessment: Needs assistance Sitting-balance support: Feet supported, No upper extremity supported Sitting balance-Leahy Scale: Fair     Standing balance support: Bilateral upper extremity supported, During functional activity Standing balance-Leahy Scale: Poor Standing balance comment: pt is using walker for WB support but getting benefit from it to move and reduce L knee pressure                            Cognition Arousal/Alertness: Awake/alert Behavior During Therapy: WFL for tasks assessed/performed Overall Cognitive Status: Within Functional Limits for tasks assessed                                          Exercises Total Joint Exercises Ankle Circles/Pumps: AROM, 10 reps, Supine Quad Sets: AROM, 10 reps Towel Squeeze: AROM, Both, 10 reps, Supine Heel Slides: AAROM, Left, 10 reps, Supine Hip ABduction/ADduction: AROM, Left, 10 reps, Supine Straight Leg Raises: 10 reps, AROM, Left, Supine Long Arc Quad: AAROM, Left, 10 reps, Seated Knee Flexion: AAROM, Left, 10 reps, Seated Goniometric ROM: 6-83 degrees    General Comments        Pertinent Vitals/Pain Pain Assessment Pain Assessment: Faces Faces Pain Scale: Hurts even more Pain Location: L knee Pain Descriptors / Indicators: Grimacing, Guarding Pain Intervention(s): Limited activity within patient's tolerance, Monitored during session, Repositioned, Patient requesting pain meds-RN notified, Ice applied    Home Living                          Prior Function            PT Goals (current goals can now be found in the care plan section) Progress towards PT goals: Progressing toward goals    Frequency    7X/week      PT Plan Discharge plan needs to be updated    Co-evaluation              AM-PAC PT "6 Clicks" Mobility   Outcome Measure  Help needed turning from your back to your side while in a flat bed without using bedrails?: None Help needed moving  from lying on your back to sitting on the side of a flat bed without using bedrails?: None Help needed moving to and from a bed to a chair (including a wheelchair)?: A Little Help needed standing up from a chair using your arms (e.g., wheelchair or bedside chair)?: A Little Help needed to walk in hospital room?: A Little Help needed climbing 3-5 steps with a railing? : A Lot 6 Click Score: 19    End of Session Equipment Utilized During Treatment: Gait belt Activity Tolerance: Patient limited by pain Patient left: in chair;with call bell/phone within reach;with chair alarm set Nurse Communication: Mobility status;Patient requests pain meds;Weight bearing status PT Visit Diagnosis: Unsteadiness on feet (R26.81);Muscle weakness (generalized) (M62.81);Pain Pain - Right/Left: Left Pain - part of body: Knee     Time: 0915-1010 PT Time Calculation (min) (ACUTE ONLY): 55 min  Charges:  $Gait Training: 8-22 mins $Therapeutic Exercise: 23-37 mins $Self Care/Home Management:  [place] M,PT Acute Rehab Services [place] 05/17/2022, 1:30 PM

## 2022-05-17 NOTE — TOC Transition Note (Signed)
Transition of Care [facility]) - CM/SW Discharge Note   Patient Details  Name: Erica Frank MRN: [id number] Date of Birth: 1941-10-28  Transition of Care [facility]) CM/SW Contact:  Bess Kinds, RN Phone Number: [phone] 05/17/2022, 11:00 AM   Clinical Narrative:     Spoke with patient at the bedside to discuss post acute transition. Patient anticipates that she will dc home later today or tomorrow. Previous RNCM arranged HH PT with [facility] - updated liaison, Morrie Sheldon, with dcp. Patient already has RW and 3n1 at home. Daughter to provide transportation home, and will provide support as needed to patient once home. No further TOC needs identified at this time.   Final next level of care: Home w Home Health Services Barriers to Discharge: Continued Medical Work up   Patient Goals and CMS Choice   CMS Medicare.gov Compare Post Acute Care list provided to:: Patient Choice offered to / list presented to : Patient  Discharge Placement                       Discharge Plan and Services   Discharge Planning Services: CM Consult            DME Arranged: N/A DME Agency: NA       HH Arranged: PT HH Agency: [facility] Date HH Agency Contacted: 05/17/22 Time HH Agency Contacted: 1059 Representative spoke with at [facility] Agency: Morrie Sheldon  Social Determinants of Health (SDOH) Interventions     Readmission Risk Interventions     No data to display

## 2022-05-17 NOTE — Progress Notes (Signed)
Patient ID: Erica Frank, female   DOB: 1941/08/06, 81 y.o.   MRN: [id number] I came by lunch to see the patient.  She has been up with physical therapy.  Her daughter is at the bedside.  She does feel at this point that she will be able to be discharged to home instead of going to skilled nursing.  She is supposed to have another therapy session this afternoon so we will likely hold off on discharge until tomorrow but hopefully discharge will be to home.  Her daughter agrees with this treatment plan as well.

## 2022-05-17 NOTE — Progress Notes (Signed)
Patient ID: Erica Frank, female   DOB: [birth year], 81 y.o.   MRN: [id number] The patient is awake and alert this morning.  Her vital signs are stable.  Her left operative knee is stable.  I did change the dressing and the staple line is intact.  Her calf is soft.  I talked her in length in detail about her knee.  I am going to have the knee immobilizer stop completely at this standpoint because she feels that this is limiting her mobility.  We talked about the possibility of short-term skilled nursing placement but she says today she decided she want to see how therapy goes because she thinks she can just be able to go home instead.  I will check on her again then around noon time.

## 2022-05-17 NOTE — Progress Notes (Signed)
05/17/22 1600  PT Visit Information  Last PT Received On 05/17/22  Assistance Needed +1  History of Present Illness 81 yo female with onset of significant pain in L knee since sx November 2022 was admitted 6/8 for revision of TKA.  Pt received arthrotomy and synovectomy and polyliner exchange on L knee, with plan to follow up with home therapy.  PMHx:  HTN, MI, A-fib, CAD, L lumbar laminectomy, atherosclerosis, angina, HTN,  Precautions  Precautions Knee;Fall  Precaution Booklet Issued Yes (comment)  Precaution Comments verbally reviewed information  Required Braces or Orthoses Knee Immobilizer - Left  Knee Immobilizer - Left Other (comment) (MD discontinued knee immobilizer)  Restrictions  Weight Bearing Restrictions Yes  LLE Weight Bearing WBAT  Pain Assessment  Pain Assessment Faces  Faces Pain Scale 6  Pain Location L knee  Pain Descriptors / Indicators Grimacing;Guarding  Pain Intervention(s) Limited activity within patient's tolerance;Monitored during session;Repositioned;Premedicated before session  Cognition  Arousal/Alertness Awake/alert  Behavior During Therapy WFL for tasks assessed/performed  Overall Cognitive Status Within Functional Limits for tasks assessed  Bed Mobility  Overal bed mobility Needs Assistance  Bed Mobility Supine to Sit  Supine to sit Supervision  General bed mobility comments Slow moving, but able to move LEs to EOB and push up on elbows without assist, used right LE to assist left LE to EOB.  Transfers  Overall transfer level Needs assistance  Equipment used Rolling walker (2 wheels);1 person hand held assist  Transfers Sit to/from Stand;Bed to chair/wheelchair/BSC  Sit to Stand Min assist;Mod assist;Min guard  General transfer comment Pt needed incr time to rise to standing as she remained in flexed position initially upon reaching for RW.  Pt needed cues for upright posture.Had more difficulty needing mod assist when standing from toilet.   Ambulation/Gait  Ambulation/Gait assistance Min guard  Gait Distance (Feet) 65 Feet  Assistive device Rolling walker (2 wheels)  Gait Pattern/deviations Step-to pattern;Decreased step length - left;Decreased stance time - left;Decreased weight shift to left;Antalgic;Trunk flexed  General Gait Details Cues for increasing step length and width; REceptive to cues and was able to return demonstration throughout gait. Pt states she feels more confident with ambulation without knee immobilizer.  Pt more tearful and anxious this pm but was still able to incr distance.  Gait velocity reduced  Gait velocity interpretation 1.31 - 2.62 ft/sec, indicative of limited community ambulator  Balance  Overall balance assessment Needs assistance  Sitting-balance support Feet supported;No upper extremity supported  Sitting balance-Leahy Scale Fair  Standing balance support Bilateral upper extremity supported;During functional activity  Standing balance-Leahy Scale Poor  Standing balance comment pt is using walker for WB support but getting benefit from it to move and reduce L knee pressure  General Comments  General comments (skin integrity, edema, etc.) Pt's daughter, Marcelino Duster, present and helpful during session  Exercises  Exercises Total Joint  Total Joint Exercises  Ankle Circles/Pumps AROM;10 reps;Supine  Quad Sets AROM;10 reps  PT - End of Session  Equipment Utilized During Treatment Gait belt  Activity Tolerance Patient limited by pain  Patient left with call bell/phone within reach;in bed;with bed alarm set;with family/visitor present  Nurse Communication Mobility status;Weight bearing status   PT - Assessment/Plan  PT Plan Current plan remains appropriate  PT Visit Diagnosis Unsteadiness on feet (R26.81);Muscle weakness (generalized) (M62.81);Pain  Pain - Right/Left Left  Pain - part of body Knee  PT Frequency (ACUTE ONLY) 7X/week  Follow Up Recommendations Home health PT  Assistance  recommended at discharge Set up Supervision/Assistance  Patient can return home with the following A little help with bathing/dressing/bathroom;Assistance with cooking/housework;Assist for transportation;Help with stairs or ramp for entrance;A little help with walking and/or transfers  PT equipment None recommended by PT  AM-PAC PT "6 Clicks" Mobility Outcome Measure (Version 2)  Help needed turning from your back to your side while in a flat bed without using bedrails? 4  Help needed moving from lying on your back to sitting on the side of a flat bed without using bedrails? 4  Help needed moving to and from a bed to a chair (including a wheelchair)? 3  Help needed standing up from a chair using your arms (e.g., wheelchair or bedside chair)? 3  Help needed to walk in hospital room? 3  Help needed climbing 3-5 steps with a railing?  2  6 Click Score 19  Consider Recommendation of Discharge To: Home with [facility]  Progressive Mobility  What is the highest level of mobility based on the progressive mobility assessment? Level 5 (Walks with assist in room/hall) - Balance while stepping forward/back and can walk in room with assist - Complete  Activity Ambulated with assistance in hallway  PT Goal Progression  Progress towards PT goals Progressing toward goals  PT Time Calculation  PT Start Time (ACUTE ONLY) 1342  PT Stop Time (ACUTE ONLY) 1408  PT Time Calculation (min) (ACUTE ONLY) 26 min  PT General Charges  $$ ACUTE PT VISIT 1 Visit  PT Treatments  $Gait Training 23-37 mins  Pt continues to progress slowly.  In more pain this pm but was able to incr distance.  Pt and daughter agree that they will be ready for pt to d/c home with daughter tomorrow.  Miray Mancino M,PT Acute Rehab Services [phone]

## 2022-05-20 ENCOUNTER — Other Ambulatory Visit: Payer: Self-pay | Admitting: Orthopaedic Surgery

## 2022-05-20 ENCOUNTER — Telehealth: Payer: Self-pay | Admitting: Orthopaedic Surgery

## 2022-05-20 MED ORDER — OXYCODONE HCL 5 MG PO TABS: 5.0000 mg | ORAL_TABLET | ORAL | 0 refills | Status: DC | PRN

## 2022-05-20 NOTE — Telephone Encounter (Addendum)
Patient called in Requesting refill on Oxycodone to be sent to [facility] pharmacy

## 2022-05-20 NOTE — Telephone Encounter (Signed)
Received $25.00 check and medical records release form from patient/Forwarding to [facility] today 

## 2022-05-20 NOTE — Telephone Encounter (Signed)
Please advise 

## 2022-05-21 ENCOUNTER — Telehealth: Payer: Self-pay

## 2022-05-21 NOTE — Telephone Encounter (Signed)
Patients daughter asking about moms knee bandage, said it's coming off some. I told her she didn't have to have that bandage, she could still shower and not saturate, and replace with guaze and tape.

## 2022-05-21 NOTE — Telephone Encounter (Signed)
Patient's daughter Marcelino Duster called and left a VM asking for a call back.  She did not state what she needed.  Cb# [phone].  Please advise.  Thank you.

## 2022-05-23 NOTE — Progress Notes (Unsigned)
 Primary Physician/Referring:  Avva, Ravisankar, MD  Patient ID: Erica Frank, female    DOB: 03/09/1941, 80 y.o.   MRN: [id number]  No chief complaint on file.  HPI:    Erica Frank  is a 80 y.o. female  with hypertension, hyperlipidemia, and paroxysmal atrial fibrillation on 01/25/2019 S/P cardioversion in the ED, CAD and balloon angioplasty in 1989 and 1990, NSTEMI on 07/09/2019 S/P stenting to the mid LAD. She did not tolerate Repatha and has discontinued this and she did not refill WelChol due to cost.  She is presently on Vytorin.  Has chronic vasovagal episodes especially when she feels like she is needing to have a bowel movement, she feels dizzy and feels like she is going to pass out, symptoms are stable.  She has not had any syncope.   No chest pain, dyspnea. States Repatha makes her very week and tired.   Past Medical History:  Diagnosis Date   Anginal pain (HCC) 1989   Anxiety    Arthritis    Atherosclerosis    Atrial fibrillation (HCC)    Coronary artery disease    Dysrhythmia    a-fib   Elevated liver enzymes    GERD (gastroesophageal reflux disease)    Heart attack (HCC) 2020   Heart disease    High cholesterol    Hypertension    Past Surgical History:  Procedure Laterality Date   ABDOMINAL HYSTERECTOMY  1983   for endometriosis with Burch   ANGIOPLASTY     ANGIOPLASTY     BACK SURGERY     1990   CARDIAC SURGERY     Catheterization   CORONARY STENT INTERVENTION N/A 07/09/2019   Procedure: CORONARY STENT INTERVENTION;  Surgeon: Ganji, Jay, MD;  [facility]: [facility];  Service: Cardiovascular;  Laterality: N/A;   LEFT HEART CATH AND CORONARY ANGIOGRAPHY N/A 07/09/2019   Procedure: LEFT HEART CATH AND CORONARY ANGIOGRAPHY and possible intervention;  Surgeon: Ganji, Jay, MD;  Location: [facility];  Service: Cardiovascular;  Laterality: N/A;  4: or 4:30 PM today   LUMBAR LAMINECTOMY/DECOMPRESSION MICRODISCECTOMY Left 02/02/2022    Procedure: Left Lumbar two-three, Lumbar five-Sacral one Sublaminar decompression;  Surgeon: Elsner, Henry, MD;  Location: [facility];  Service: Neurosurgery;  Laterality: Left;   NECK SURGERY     TOTAL KNEE ARTHROPLASTY Left 10/07/2020   Procedure: TOTAL KNEE ARTHROPLASTY;  Surgeon: Olin, Matthew, MD;  Location: [facility];  Service: Orthopedics;  Laterality: Left;  70 mins   TOTAL KNEE REVISION Left 05/13/2022   Procedure: SYNOVECTOMY AND LEFT KNEE POLY-LINER EXCHANGE;  Surgeon: Blackman, Christopher Y, MD;  Location: [facility];  Service: Orthopedics;  Laterality: Left;   Family History  Problem Relation Age of Onset   Uterine cancer Mother    Hypertension Brother    Heart disease Brother    Breast cancer Cousin     Social History   Tobacco Use   Smoking status: Never   Smokeless tobacco: Never  Substance Use Topics   Alcohol use: Not Currently    Alcohol/week: 3.0 standard drinks of alcohol    Types: 3 Glasses of wine per week    Comment: "I havent in several months" 05/06/22   Marital Status: Widowed  ROS  Review of Systems  Cardiovascular:  Negative for dyspnea on exertion, leg swelling and syncope (near syncope).  Musculoskeletal:  Positive for arthritis (bilateral knee) and joint pain (bilateral knee pain).  Gastrointestinal:  Negative for melena.  Neurological:  Positive   for focal weakness (left leg due to arthritis).  All other systems reviewed and are negative.  Objective  There were no vitals taken for this visit.     05/17/2022    2:15 PM 05/17/2022    7:52 AM 05/17/2022   12:07 AM  Vitals with BMI  Systolic 110 118 117  Diastolic 63 59 49  Pulse 74 70 82     Physical Exam Constitutional:      General: She is not in acute distress.    Appearance: She is well-developed.  Neck:     Vascular: Carotid bruit (left) present. No JVD.  Cardiovascular:     Rate and Rhythm: Normal rate and regular rhythm.     Pulses: Intact distal pulses.          Carotid pulses are  on the right  side with bruit.    Heart sounds: No murmur heard.    No gallop.  Pulmonary:     Effort: Pulmonary effort is normal. No accessory muscle usage.     Breath sounds: Normal breath sounds.  Abdominal:     General: Bowel sounds are normal.     Palpations: Abdomen is soft.  Musculoskeletal:        General: No swelling.  Skin:    Capillary Refill: Capillary refill takes less than 2 seconds.    Laboratory examination:   Recent Labs    02/02/22 1130 05/06/22 1400 05/14/22 0459  NA 136 139 137  K 4.3 4.5 4.6  CL 100 105 103  CO2 28 26 30  GLUCOSE 100* 97 119*  BUN 21 17 6*  CREATININE 0.96 0.80 0.79  CALCIUM 8.7* 8.9 8.9  GFRNONAA 60* >60 >60    estimated creatinine clearance is 46.9 mL/min (by C-G formula based on SCr of 0.79 mg/dL).     Latest Ref Rng & Units 05/14/2022    4:59 AM 05/06/2022    2:00 PM 02/02/2022   11:30 AM  CMP  Glucose 70 - 99 mg/dL 119  97  100   BUN 8 - 23 mg/dL 6  17  21   Creatinine 0.44 - 1.00 mg/dL 0.79  0.80  0.96   Sodium 135 - 145 mmol/L 137  139  136   Potassium 3.5 - 5.1 mmol/L 4.6  4.5  4.3   Chloride 98 - 111 mmol/L 103  105  100   CO2 22 - 32 mmol/L 30  26  28   Calcium 8.9 - 10.3 mg/dL 8.9  8.9  8.7   Total Protein 6.5 - 8.1 g/dL  6.3    Total Bilirubin 0.3 - 1.2 mg/dL  0.3    Alkaline Phos 38 - 126 U/L  66    AST 15 - 41 U/L  28    ALT 0 - 44 U/L  25        Latest Ref Rng & Units 05/14/2022    4:59 AM 05/06/2022    2:00 PM 02/02/2022   11:30 AM  CBC  WBC 4.0 - 10.5 K/uL 8.2  7.4  9.1   Hemoglobin 12.0 - 15.0 g/dL 13.7  13.7  14.8   Hematocrit 36.0 - 46.0 % 41.2  42.9  44.0   Platelets 150 - 400 K/uL 256  255  354    External labs:  Cholesterol, total 114.000 m 02/09/2022 HDL 51.000 mg 02/09/2022 LDL 34.000 mg 02/09/2022 Triglycerides 143.000 m 02/09/2022  Hemoglobin 13.700 g/d 05/14/2022 Platelets 256.000 K/ 05/14/2022  Creatinine, Serum 0.790   mg/ 05/14/2022 Potassium 4.600 mm 05/14/2022 ALT (SGPT) 25.000 U/L 05/06/2022  TSH 1.070  02/09/2022  Medications and allergies   Allergies  Allergen Reactions   Hydrocodone     "Makes her feel crazy" Depressed   Repatha [Evolocumab] Diarrhea      Medications after today's encounter  Current Outpatient Medications:    acetaminophen (TYLENOL) 500 MG tablet, Take 1,000 mg by mouth daily as needed (pain)., Disp: , Rfl:    Alirocumab (PRALUENT) 75 MG/ML SOAJ, Inject 75 mg into the skin every 14 (fourteen) days., Disp: 6 mL, Rfl: 3   allopurinol (ZYLOPRIM) 100 MG tablet, Take 100 mg by mouth daily., Disp: , Rfl:    aspirin 81 MG chewable tablet, Chew 1 tablet (81 mg total) by mouth 2 (two) times daily., Disp: 30 tablet, Rfl: 0   BIOTIN PO, Take 1 tablet by mouth daily. Hair, skin & nails, Disp: , Rfl:    CALCIUM PO, Take 1 tablet by mouth daily., Disp: , Rfl:    Cyanocobalamin (VITAMIN B-12 PO), Take 1 tablet by mouth See admin instructions. Take 1 tablet by mouth daily when able to remember, Disp: , Rfl:    DULoxetine (CYMBALTA) 60 MG capsule, Take 60 mg by mouth See admin instructions. Take 60 mg by mouth daily PRN for knee pain, Disp: , Rfl:    ezetimibe-simvastatin (VYTORIN) 10-40 MG tablet, TAKE 1 TABLET BY MOUTH DAILY (Patient taking differently: Take 1 tablet by mouth at bedtime.), Disp: 90 tablet, Rfl: 1   isosorbide mononitrate (IMDUR) 60 MG 24 hr tablet, TAKE 1 TABLET(60 MG) BY MOUTH DAILY (Patient taking differently: Take 60 mg by mouth daily.), Disp: 90 tablet, Rfl: 3   lisinopril (ZESTRIL) 5 MG tablet, TAKE 1 TABLET(5 MG) BY MOUTH DAILY (Patient taking differently: Take 5 mg by mouth at bedtime.), Disp: 90 tablet, Rfl: 3   methocarbamol (ROBAXIN) 500 MG tablet, Take 1 tablet (500 mg total) by mouth every 6 (six) hours as needed for muscle spasms., Disp: 30 tablet, Rfl: 0   nitroGLYCERIN (NITROSTAT) 0.4 MG SL tablet, Place 1 tablet (0.4 mg total) under the tongue every 5 (five) minutes as needed for chest pain., Disp: 25 tablet, Rfl: 11   oxyCODONE (OXY IR/ROXICODONE) 5 MG  immediate release tablet, Take 1-2 tablets (5-10 mg total) by mouth every 4 (four) hours as needed for moderate pain (pain score 4-6)., Disp: 30 tablet, Rfl: 0   Polyvinyl Alcohol-Povidone PF (REFRESH) 1.4-0.6 % SOLN, Place 1 drop into both eyes daily as needed (Dry eyes)., Disp: , Rfl:    traMADol (ULTRAM) 50 MG tablet, Take 100 mg by mouth daily., Disp: , Rfl:     Radiology:   CT Head Wo Contrast 01/16/2020: No acute intracranial abnormality noted. Diffuse mucosal thickening within the right maxillary antrum.  Cardiac Studies:   Coronary Angiography & Balloon Angioplasty of LAD in 1989 and 1990.   DCCV 01/25/2019: 120J x 1 to NSR.  Lexiscan myoview stress test 02/26/2019:  1. Lexiscan stress test was performed. Exercise capacity was not assessed. No stress symptoms reported. Resting blood pressure was 110/70 mmHg and peak effect blood pressure was 146/60 mmHg. The resting and stress electrocardiogram demonstrated normal sinus rhythm, normal  conduction, occasional PAC, and normal repolarization.   Stress EKG is non diagnostic for ischemia as it is a pharmacologic stress.  2. The overall quality of the study is good. There is no evidence of abnormal lung activity. Stress and rest SPECT images demonstrate homogeneous tracer distribution throughout the   myocardium. Gated SPECT imaging reveals normal myocardial thickening and wall motion. The left ventricular ejection fraction was normal (52%).   3. Low risk study.  Coronary Angiography 07/09/2019: Moderate amount of diffuse coronary calcification noted in all coronary vessels including left main.  Right coronary artery and ramus intermediate showed mid 30% stenosis.  Mid LAD had a focal moderately calcified high-grade 95% stenosis S/P  STENT SYNERGY DES 3X24. Maximum pressure: 16 atm. Inflation time: 75 sec. Stent strut is well apposed.  There was residual 0% stenosis with maintenance of TIMI-3 TIMI-3 flow  Echocardiogram 04/23/2019: Normal LV  systolic function with EF 59%. Left ventricle cavity is normal in size. Normal global wall motion. Calculated EF 59%. Moderate (Grade II) mitral regurgitation. E-wave dominant mitral inflow. Mild tricuspid regurgitation. No evidence of pulmonary hypertension.  Event monitor 02/01/2020 - 03/01/2020.  Baseline sample showed Sinus Rhythm w/Artifact with a heart rate of 61.5 bpm. There were 1 critical, 0 serious, and 5 stable events that occurred.  4 patient activated events were accidental push or no symptoms reported revealed normal sinus rhythm. There was a 11 beat NSVT noted at the rate of 178 bpm on 02/04/2020 at 5:41 AM.  Otherwise no heart block, no atrial fibrillation, rare PVCs noted.  Carotid artery duplex 05/06/2022: Duplex suggests stenosis in the right internal carotid artery (16-49%). Duplex suggests stenosis in the right external carotid artery (<50%). Duplex suggests stenosis in the left internal carotid artery (50-69%), upper end of the spectrum. Duplex suggests stenosis in the left external carotid artery (<50%). Antegrade right vertebral artery flow. Antegrade left vertebral artery flow. Compared to the study done on 11/05/2021, no significant change. Follow up in six months is appropriate if clinically indicated.  EKG  EKG 11/12/2021: Marked sinus bradycardia at the rate of 55 bpm, leftward axis.  No evidence of ischemia..  No change from 05/07/2021, previously heart rate was 47 bpm.    Assessment     ICD-10-CM   1. Coronary artery disease of native artery of native heart with stable angina pectoris (HCC)  I25.118     2. Asymptomatic bilateral carotid artery stenosis  I65.23     3. Paroxysmal atrial fibrillation (HCC)  I48.0       No orders of the defined types were placed in this encounter.   There are no discontinued medications.   Recommendations:   Ms. Erica Frank  is a 80 y.o. female  with hypertension, hyperlipidemia, asymptomatic chronic sinus bradycardia,  and paroxysmal atrial fibrillation on 01/25/2019 S/P cardioversion in the ED, CAD and balloon angioplasty in 1989 and 1990, NSTEMI on 07/09/2019 S/P stenting to the mid LAD. She did not tolerate Repatha, however has continued to taking this as encysted due to underlying coronary disease and coronary artery disease.  She develops marked generalized fatigue and weakness and also arthralgias with Repatha.  She is presently on Vytorin.  I could consider siRNA molecule (Leqivio).  Patient has vagally mediated presyncope related to bowel movements.  This is remained stable.     From cardiac standpoint she has not had any angina pectoris, no clinical evidence of heart failure, blood pressure is well controlled.  She continues to have significant discomfort since knee replacement on the left.   Carotid artery duplex shows minimal progression in left carotid disease, will continue 6 monthly surveillance.  OV in 6 months.   Jay Ganji, MD, FACC 05/23/2022, 9:48 PM Office: [phone] Pager: [phone]  

## 2022-05-24 ENCOUNTER — Ambulatory Visit: Payer: Medicare Other | Admitting: Cardiology

## 2022-05-24 ENCOUNTER — Encounter: Payer: Self-pay | Admitting: Cardiology

## 2022-05-24 VITALS — BP 118/70 | HR 69 | Temp 98.0°F | Resp 17 | Ht 60.0 in | Wt 129.4 lb

## 2022-05-24 DIAGNOSIS — I25118 Atherosclerotic heart disease of native coronary artery with other forms of angina pectoris: Secondary | ICD-10-CM

## 2022-05-24 DIAGNOSIS — I480 Paroxysmal atrial fibrillation: Secondary | ICD-10-CM

## 2022-05-24 DIAGNOSIS — I6523 Occlusion and stenosis of bilateral carotid arteries: Secondary | ICD-10-CM

## 2022-05-24 DIAGNOSIS — E7800 Pure hypercholesterolemia, unspecified: Secondary | ICD-10-CM

## 2022-05-24 MED ORDER — APIXABAN 5 MG PO TABS: 5.0000 mg | ORAL_TABLET | Freq: Two times a day (BID) | ORAL | 3 refills | Status: DC

## 2022-05-25 ENCOUNTER — Telehealth: Payer: Self-pay

## 2022-05-25 ENCOUNTER — Other Ambulatory Visit: Payer: Self-pay | Admitting: Orthopaedic Surgery

## 2022-05-25 MED ORDER — OXYCODONE HCL 5 MG PO TABS: 5.0000 mg | ORAL_TABLET | Freq: Four times a day (QID) | ORAL | 0 refills | Status: DC | PRN

## 2022-05-25 NOTE — Telephone Encounter (Signed)
Requesting refill of Oxycodone  

## 2022-05-27 ENCOUNTER — Ambulatory Visit (INDEPENDENT_AMBULATORY_CARE_PROVIDER_SITE_OTHER): Payer: Medicare Other | Admitting: Orthopaedic Surgery

## 2022-05-27 ENCOUNTER — Encounter: Payer: Self-pay | Admitting: Orthopaedic Surgery

## 2022-05-27 ENCOUNTER — Other Ambulatory Visit: Payer: Self-pay

## 2022-05-27 DIAGNOSIS — Z96652 Presence of left artificial knee joint: Secondary | ICD-10-CM

## 2022-05-27 DIAGNOSIS — M659 Synovitis and tenosynovitis, unspecified: Secondary | ICD-10-CM

## 2022-05-27 NOTE — Progress Notes (Signed)
The patient is 2 weeks status post a left knee open synovectomy with polyliner exchange from a chronically painful total knee arthroplasty.  The original knee is done by one my colleagues in town.  Fortunately did not find any evidence of prosthetic loosening.  She tolerated the poly exchange well is now pushing yourself through home health therapy.  She wants to proceed soon to outpatient therapy and I agree with this as well.  On exam she has swelling of her left operative knee which is to be expected.  It feels stable on my exam and her extension is almost full and her flexion is to 90 degrees and just past that.  She is on Eliquis is a blood thinning medication that she is staying on.  She did get a refill pain medicine yesterday.  She is driving as well.  She is an active 81 year old female.  She is very motivated about outpatient physical therapy and would like to have this done at least 3 times a week.  We will see if we can get that set up for upstairs here and then I will see her back in 4 weeks to see how she is doing overall but no x-rays are needed.

## 2022-05-31 NOTE — Therapy (Signed)
OUTPATIENT PHYSICAL THERAPY LOWER EXTREMITY EVALUATION   Patient Name: Erica Frank MRN: [id number] DOB:1941/06/23, 81 y.o., female Today's Date: 06/01/2022   PT End of Session - 06/01/22 1520     Visit Number 1    Number of Visits 24    Date for PT Re-Evaluation 07/30/22    Authorization Type [employer] medicare    Progress Note Due on Visit 10    PT Start Time 1430    PT Stop Time 1513    PT Time Calculation (min) 43 min    Activity Tolerance Patient limited by pain;Patient tolerated treatment well    Behavior During Therapy [facility] for tasks assessed/performed             Past Medical History:  Diagnosis Date   Anginal pain (HCC) 1989   Anxiety    Arthritis    Atherosclerosis    Atrial fibrillation (HCC)    Coronary artery disease    Dysrhythmia    a-fib   Elevated liver enzymes    GERD (gastroesophageal reflux disease)    Heart attack (HCC) 2020   Heart disease    High cholesterol    Hypertension    NSTEMI (non-ST elevated myocardial infarction) (HCC) 07/09/2019   Past Surgical History:  Procedure Laterality Date   ABDOMINAL HYSTERECTOMY  1983   for endometriosis with Burch   ANGIOPLASTY     ANGIOPLASTY     BACK SURGERY     1990   CARDIAC SURGERY     Catheterization   CORONARY STENT INTERVENTION N/A 07/09/2019   Procedure: CORONARY STENT INTERVENTION;  Surgeon: Yates Decamp, MD;  [facility]: [facility];  Service: Cardiovascular;  Laterality: N/A;   LEFT HEART CATH AND CORONARY ANGIOGRAPHY N/A 07/09/2019   Procedure: LEFT HEART CATH AND CORONARY ANGIOGRAPHY and possible intervention;  Surgeon: Yates Decamp, MD;  Location: [facility];  Service: Cardiovascular;  Laterality: N/A;  4: or 4:30 PM today   LUMBAR LAMINECTOMY/DECOMPRESSION MICRODISCECTOMY Left 02/02/2022   Procedure: Left Lumbar two-three, Lumbar five-Sacral one Sublaminar decompression;  Surgeon: Barnett Abu, MD;  Location: [facility];  Service: Neurosurgery;  Laterality: Left;   NECK SURGERY      TOTAL KNEE ARTHROPLASTY Left 10/07/2020   Procedure: TOTAL KNEE ARTHROPLASTY;  Surgeon: Durene Romans, MD;  Location: [facility];  Service: Orthopedics;  Laterality: Left;  70 mins   TOTAL KNEE REVISION Left 05/13/2022   Procedure: SYNOVECTOMY AND LEFT KNEE POLY-LINER EXCHANGE;  Surgeon: Kathryne Hitch, MD;  Location: [facility];  Service: Orthopedics;  Laterality: Left;   Patient Active Problem List   Diagnosis Date Noted   Status post revision of total replacement of left knee 05/13/2022   Degenerative lumbar spinal stenosis 02/02/2022   S/P left TKA 10/07/2020   Left knee OA 10/07/2020   Syncope 01/16/2020   Anemia 01/16/2020   Thrombocytopenia (HCC) 01/16/2020   Paroxysmal atrial fibrillation (HCC) 02/15/2019   Mixed hyperlipidemia 02/15/2019   Bradycardia 02/15/2019   Coronary artery disease involving native coronary artery of native heart with unstable angina pectoris (HCC) 12/20/2018   Dyslipidemia 12/20/2018   Essential hypertension 12/20/2018    PCP: Chilton Greathouse, MD  REFERRING PROVIDER: Kathryne Hitch, MD  REFERRING DIAG: [phone] (ICD-10-CM) - Status post revision of total knee, left  THERAPY DIAG:  Acute pain of left knee  Stiffness of left knee, not elsewhere classified  Muscle weakness (generalized)  Localized edema  Difficulty in walking, not elsewhere classified  Rationale for Evaluation and Treatment  Rehabilitation  ONSET DATE: 05/13/2022  SUBJECTIVE:   SUBJECTIVE STATEMENT: Pt arriving using a rolling walker reporting 4-5 visits from HHPT. Pt stating today is a bad day with pain 7/10.  Pt stating she is trying to wean off the oxycodone.   PERTINENT HISTORY: HTN, MI, A-fib, CAD, L lumbar laminectomy, atherosclerosis, angina, HTN,   PAIN:  Are you having pain? Yes: NPRS scale: 7/10 Pain location: left knee Pain description: achy, burning Aggravating factors: walking, standing Relieving factors: meds, icing  PRECAUTIONS:  None  WEIGHT BEARING RESTRICTIONS No  FALLS:  Has patient fallen in last 6 months? Yes following back operation 3 months ago  LIVING ENVIRONMENT: Lives with: lives with their family, daughter lives with pt Lives in: House/apartment Stairs: No, bedroom on main floor, ramp to enter Has following equipment at home: Environmental consultant - 2 wheeled, st cane  OCCUPATION: retired  PLOF: Independent  PATIENT GOALS walk without walker, back to daily activities, golfing, swimming, work in garden.    OBJECTIVE:     PATIENT SURVEYS:  05/31/22: FOTO 36% (predicted 51%)  COGNITION:  Overall cognitive status: Within functional limits for tasks assessed     SENSATION: WFL  EDEMA:  Circumferential: Rt knee : 37.5 centimeters Circumferential: Leftt knee: 39 centimeters  MUSCLE LENGTH: Hamstrings: Right 86 deg; Left 75 deg  POSTURE: rounded shoulders and forward head  PALPATION: Tender on medial and lateral joint line with more pain reported on medial side  LOWER EXTREMITY ROM:  Active ROM Right eval Left eval  Hip flexion    Hip extension    Hip abduction    Hip adduction    Knee flexion 125 80  Knee extension 0 -4   (Blank rows = not tested)  Passive ROM Left eval  Hip flexion   Hip extension   Hip abduction   Hip adduction   Knee flexion 88  Knee extension -2     LOWER EXTREMITY MMT:  MMT Right eval Left eval  Hip flexion 5/5 4/5  Hip extension    Hip abduction 4/5 4/5  Hip adduction 4/5 4/5  Hip internal rotation    Hip external rotation    Knee flexion 5/5 2/5  Knee extension 5/5 2/5  Ankle dorsiflexion 5/5 5/5  Ankle plantarflexion 5/5 5/5  Ankle inversion    Ankle eversion     (Blank rows = not tested)    FUNCTIONAL TESTS:  5 times sit to stand: 15 seconds with UE support  GAIT: Distance walked: 50 feet  Assistive device utilized: Walker - 2 wheeled Level of assistance: Modified independence Comments: antalgic gait pattern, decreased left knee  flexion and hip flexion    TODAY'S TREATMENT: HEP instruction/performance c cues for techniques, handout provided.  Trial set performed of each for comprehension and symptom assessment.  See below for exercise list.    PATIENT EDUCATION:  Education details: PT POC, HEP Person educated: Patient Education method: Explanation, Demonstration, Tactile cues, Verbal cues, and Handouts Education comprehension: verbalized understanding, returned demonstration, and needs further education   HOME EXERCISE PROGRAM: Access Code: [email or web address] URL: [email or web address] Date: 06/01/2022 Prepared by: Narda Amber  Exercises - Supine Straight Leg Raises  - 3 x daily - 7 x weekly - 2 sets - 10 reps - Supine Heel Slide with Strap  - 3 x daily - 7 x weekly - 2 sets - 10 reps - Long Sitting Quad Set with Towel Roll Under Heel  - 3 x daily - 7 x weekly - 2 sets -  10 reps - 5 seconds hold - Seated Long Arc Quad  - 3 x daily - 7 x weekly - 2 sets - 10 reps - 3 seconds hold - Seated Knee Flexion AAROM  - 3 x daily - 7 x weekly - 10 reps - 10 seconds hold  ASSESSMENT:  CLINICAL IMPRESSION: 05/31/22:  Patient is a 81 y.o. who comes to clinic with complaints of Left knee pain s/p total knee revision on 05/13/2022 with mobility, strength and movement coordination deficits that impair their ability to perform usual daily and recreational functional activities without increase difficulty/symptoms.  Patient to benefit from skilled PT services to address impairments and limitations to improve to previous level of function without restriction secondary to condition.   OBJECTIVE IMPAIRMENTS Abnormal gait, decreased activity tolerance, decreased balance, decreased mobility, difficulty walking, decreased ROM, decreased strength, increased edema, impaired flexibility, and pain.   ACTIVITY LIMITATIONS lifting, bending, sitting, standing, sleeping, stairs, transfers, and dressing  PARTICIPATION LIMITATIONS:  cleaning, driving, and shopping  PERSONAL FACTORS   HTN, MI, A-fib, CAD, L lumbar laminectomy, atherosclerosis, angina, HTN,  are also affecting patient's functional outcome.   REHAB POTENTIAL: Good  CLINICAL DECISION MAKING: Stable/uncomplicated  EVALUATION COMPLEXITY: Low   GOALS: Goals reviewed with patient? Yes  SHORT TERM GOALS: Target date: 07/06/2022  Patient will demonstrate independent use of initial home exercise program to maintain progress from in clinic treatments. Goal status: New        2. Pt will be able to navigate curb step with st cane modified independently.          A. Goal Status: New         Long term PT goals (target dates for all long term goals are 8 weeks  07/30/2022 ) Patient will demonstrate/report pain at worst less than or equal to 2/10 with ADL's.  Goal status: New   Patient will demonstrate independent use of home exercise program to facilitate ability to maintain/progress functional gains from skilled physical therapy services. Goal status: New   Patient will demonstrate FOTO outcome > or = 51 % to indicate reduced disability due to condition. Goal status: New   Pt will be able to amb c no device community surfaces with normalize gait pattern.  Goal status: New       5.  Pt will be able to navigate 1 flight of stairs with single hand rail with step  over step pattern.   Goal status: New  6. Pt will improve Left knee flexion to >/= 120 degrees to perform functional mobility.      A. Goal Status: New   PLAN: PT FREQUENCY:  2-3 x/ week   PT DURATION: 8 weeks  PLANNED INTERVENTIONS: Therapeutic exercises, Therapeutic activity, Neuromuscular re-education, Balance training, Gait training, Patient/Family education, Joint mobilization, Stair training, Electrical stimulation, Cryotherapy, Moist heat, Taping, Vasopneumatic device, and Manual therapy  PLAN FOR NEXT SESSION: Nustep, LE strengtheing, ROM, vasopneumatic   Sharmon Leyden, PT,  MPT 06/01/2022, 3:23 PM

## 2022-06-01 ENCOUNTER — Ambulatory Visit: Payer: Medicare Other | Admitting: Physical Therapy

## 2022-06-01 ENCOUNTER — Other Ambulatory Visit: Payer: Self-pay

## 2022-06-01 ENCOUNTER — Encounter: Payer: Self-pay | Admitting: Physical Therapy

## 2022-06-01 DIAGNOSIS — M25662 Stiffness of left knee, not elsewhere classified: Secondary | ICD-10-CM | POA: Diagnosis not present

## 2022-06-01 DIAGNOSIS — M6281 Muscle weakness (generalized): Secondary | ICD-10-CM | POA: Diagnosis not present

## 2022-06-01 DIAGNOSIS — R262 Difficulty in walking, not elsewhere classified: Secondary | ICD-10-CM

## 2022-06-01 DIAGNOSIS — R600 Localized edema: Secondary | ICD-10-CM

## 2022-06-01 DIAGNOSIS — M25562 Pain in left knee: Secondary | ICD-10-CM

## 2022-06-03 ENCOUNTER — Encounter: Payer: Self-pay | Admitting: Physical Therapy

## 2022-06-03 ENCOUNTER — Ambulatory Visit: Payer: Medicare Other | Admitting: Physical Therapy

## 2022-06-03 DIAGNOSIS — M6281 Muscle weakness (generalized): Secondary | ICD-10-CM | POA: Diagnosis not present

## 2022-06-03 DIAGNOSIS — M25662 Stiffness of left knee, not elsewhere classified: Secondary | ICD-10-CM

## 2022-06-03 DIAGNOSIS — R600 Localized edema: Secondary | ICD-10-CM | POA: Diagnosis not present

## 2022-06-03 DIAGNOSIS — M25562 Pain in left knee: Secondary | ICD-10-CM | POA: Diagnosis not present

## 2022-06-03 DIAGNOSIS — R262 Difficulty in walking, not elsewhere classified: Secondary | ICD-10-CM

## 2022-06-03 NOTE — Therapy (Signed)
OUTPATIENT PHYSICAL THERAPY TREATMENT NOTE   Patient Name: Erica Frank MRN: [id number] DOB:1941-03-13, 81 y.o., female Today's Date: 06/03/2022  PCP: Prince Solian, MD REFERRING PROVIDER: Mcarthur Rossetti, MD  END OF SESSION:   PT End of Session - 06/03/22 1019     Visit Number 2    Number of Visits 24    Date for PT Re-Evaluation 07/30/22    Authorization Type [employer] medicare    Progress Note Due on Visit 10    PT Start Time 1015    PT Stop Time 1115    PT Time Calculation (min) 60 min    Activity Tolerance Patient limited by pain;Patient tolerated treatment well    Behavior During Therapy [facility] for tasks assessed/performed             Past Medical History:  Diagnosis Date   Anginal pain ([place]) 1989   Anxiety    Arthritis    Atherosclerosis    Atrial fibrillation ([place])    Coronary artery disease    Dysrhythmia    a-fib   Elevated liver enzymes    GERD (gastroesophageal reflux disease)    Heart attack ([place]) 2020   Heart disease    High cholesterol    Hypertension    NSTEMI (non-ST elevated myocardial infarction) ([place]) 07/09/2019   Past Surgical History:  Procedure Laterality Date   ABDOMINAL HYSTERECTOMY  1983   for endometriosis with Burch   ANGIOPLASTY     ANGIOPLASTY     BACK SURGERY     1990   CARDIAC SURGERY     Catheterization   CORONARY STENT INTERVENTION N/A 07/09/2019   Procedure: CORONARY STENT INTERVENTION;  Surgeon: Adrian Prows, MD;  Location: [place] [facility];  Service: Cardiovascular;  Laterality: N/A;   LEFT HEART CATH AND CORONARY ANGIOGRAPHY N/A 07/09/2019   Procedure: LEFT HEART CATH AND CORONARY ANGIOGRAPHY and possible intervention;  Surgeon: Adrian Prows, MD;  Location: [place] [facility];  Service: Cardiovascular;  Laterality: N/A;  4: or 4:30 PM today   LUMBAR LAMINECTOMY/DECOMPRESSION MICRODISCECTOMY Left 02/02/2022   Procedure: Left Lumbar two-three, Lumbar five-Sacral one Sublaminar decompression;  Surgeon: Kristeen Miss, MD;  Location: [place];  Service: Neurosurgery;  Laterality: Left;   NECK SURGERY     TOTAL KNEE ARTHROPLASTY Left 10/07/2020   Procedure: TOTAL KNEE ARTHROPLASTY;  Surgeon: Paralee Cancel, MD;  Location: [facility];  Service: Orthopedics;  Laterality: Left;  70 mins   TOTAL KNEE REVISION Left 05/13/2022   Procedure: SYNOVECTOMY AND LEFT KNEE POLY-LINER EXCHANGE;  Surgeon: Mcarthur Rossetti, MD;  Location: [place];  Service: Orthopedics;  Laterality: Left;   Patient Active Problem List   Diagnosis Date Noted   Status post revision of total replacement of left knee 05/13/2022   Degenerative lumbar spinal stenosis 02/02/2022   S/P left TKA 10/07/2020   Left knee OA 10/07/2020   Syncope 01/16/2020   Anemia 01/16/2020   Thrombocytopenia ([place]) 01/16/2020   Paroxysmal atrial fibrillation ([place]) 02/15/2019   Mixed hyperlipidemia 02/15/2019   Bradycardia 02/15/2019   Coronary artery disease involving native coronary artery of native heart with unstable angina pectoris ([place]) 12/20/2018   Dyslipidemia 12/20/2018   Essential hypertension 12/20/2018    REFERRING DIAG: [id number] (ICD-10-CM) - Status post revision of total knee, left  ONSET DATE: 05/13/2022  THERAPY DIAG:  Acute pain of left knee  Stiffness of left knee, not elsewhere classified  Muscle weakness (generalized)  Localized edema  Difficulty in walking, not elsewhere classified  Rationale for Evaluation and Treatment Rehabilitation  PERTINENT HISTORY: HTN, MI, A-fib, CAD, L lumbar laminectomy, atherosclerosis, angina, HTN  PRECAUTIONS: None  SUBJECTIVE: she reports exercises went well. She does 1-2 every 1-2 hours. She sleeps good because of meds.   PAIN:  Are you having pain? Yes: NPRS scale: this morning 6/10 and since PT eval lowest 0/10 & 8/10 Pain location: left knee more in front Pain description: achy & burning, tightness Aggravating factors: exercising bending knee Relieving factors: laying down, meds &  ice  OBJECTIVE: (objective measures completed at initial evaluation unless otherwise dated)  OBJECTIVE:    PATIENT SURVEYS:  05/31/22: FOTO 36% (predicted 51%)                       SENSATION: 06/01/2022:  WFL   EDEMA:  06/01/2022:  Circumferential: Rt knee : 37.5 centimeters 06/01/2022:  Circumferential: Left knee: 39 centimeters   MUSCLE LENGTH: 06/01/2022:  Hamstrings: Right 86 deg; Left 75 deg  POSTURE: 06/01/2022:  rounded shoulders and forward head  PALPATION: 06/01/2022:  Tender on medial and lateral joint line with more pain reported on medial side  LOWER EXTREMITY ROM:  Active ROM Right Eval 06/01/22 Left Eval 06/01/22  Hip flexion      Hip extension      Hip abduction      Hip adduction      Knee flexion 125 80  Knee extension 0 -4   (Blank rows = not tested)   Passive ROM Left Eval 06/01/22  Hip flexion    Hip extension    Hip abduction    Hip adduction    Knee flexion 88  Knee extension -2       LOWER EXTREMITY MMT:  MMT Right Eval 06/01/22 Left Eval 06/01/22  Hip flexion 5/5 4/5  Hip extension      Hip abduction 4/5 4/5  Hip adduction 4/5 4/5  Hip internal rotation      Hip external rotation      Knee flexion 5/5 2/5  Knee extension 5/5 2/5  Ankle dorsiflexion 5/5 5/5  Ankle plantarflexion 5/5 5/5  Ankle inversion      Ankle eversion       (Blank rows = not tested)       FUNCTIONAL TESTS:  06/01/2022:  5 times sit to stand: 15 seconds with UE support   GAIT: 06/01/2022:  Distance walked: 50 feet  Assistive device utilized: Environmental consultant - 2 wheeled Level of assistance: Modified independence Comments: antalgic gait pattern, decreased left knee flexion and hip flexion       TODAY'S TREATMENT: 06/03/2022: Pt reported intestinal issues requiring 3 trips to bathroom during session. Pt unable to tolerate activities after using Nustep except to work on gait to/from bathroom.   Therapeutic Exercise:  Aerobic: Nustep seat 5 with BLEs/BUEs level 5  for 8 min.  Supine: Prone:  Seated:  Standing: Neuromuscular Re-education: Manual Therapy: Therapeutic Activity:  PT demo & verbal cues on weight shifting onto LLE with pelvic movements for 5-10 times upon arising prior to walking. Pt return demo & verbalized understanding. Her gait with RW had less decrease in LLE stance duration. Pt ambulated 50' X 2 with cane. Initially with contact assist progressing to supervision.  No balance issues noted.  Self Care: Trigger Point Dry Needling:  Modalities:    06/01/2022:  HEP instruction/performance c cues for techniques, handout provided.  Trial set performed of each for comprehension and symptom assessment.  See below  for exercise list.       PATIENT EDUCATION:  Education details: PT POC, HEP Person educated: Patient Education method: Explanation, Demonstration, Tactile cues, Verbal cues, and Handouts Education comprehension: verbalized understanding, returned demonstration, and needs further education     HOME EXERCISE PROGRAM: Access Code: [email or web address] URL: [email or web address] Date: 06/01/2022 Prepared by: Narda Amber   Exercises - Supine Straight Leg Raises  - 3 x daily - 7 x weekly - 2 sets - 10 reps - Supine Heel Slide with Strap  - 3 x daily - 7 x weekly - 2 sets - 10 reps - Long Sitting Quad Set with Towel Roll Under Heel  - 3 x daily - 7 x weekly - 2 sets - 10 reps - 5 seconds hold - Seated Long Arc Quad  - 3 x daily - 7 x weekly - 2 sets - 10 reps - 3 seconds hold - Seated Knee Flexion AAROM  - 3 x daily - 7 x weekly - 10 reps - 10 seconds hold   ASSESSMENT:   CLINICAL IMPRESSION: PT session significantly limited by patient's intestinal issues requiring large amount of time in bathroom.  She improved gait with cane with instruction.    OBJECTIVE IMPAIRMENTS Abnormal gait, decreased activity tolerance, decreased balance, decreased mobility, difficulty walking, decreased ROM, decreased strength, increased  edema, impaired flexibility, and pain.    ACTIVITY LIMITATIONS lifting, bending, sitting, standing, sleeping, stairs, transfers, and dressing   PARTICIPATION LIMITATIONS: cleaning, driving, and shopping   PERSONAL FACTORS   HTN, MI, A-fib, CAD, L lumbar laminectomy, atherosclerosis, angina, HTN,  are also affecting patient's functional outcome.    REHAB POTENTIAL: Good   CLINICAL DECISION MAKING: Stable/uncomplicated   EVALUATION COMPLEXITY: Low     GOALS: Goals reviewed with patient? Yes   SHORT TERM GOALS: Target date: 07/06/2022  Patient will demonstrate independent use of initial home exercise program to maintain progress from in clinic treatments. Goal status: New         2. Pt will be able to navigate curb step with st cane modified independently.                    A. Goal Status: New         Long term PT goals (target dates for all long term goals are 8 weeks  07/30/2022 ) Patient will demonstrate/report pain at worst less than or equal to 2/10 with ADL's.  Goal status: New   Patient will demonstrate independent use of home exercise program to facilitate ability to maintain/progress functional gains from skilled physical therapy services. Goal status: New   Patient will demonstrate FOTO outcome > or = 51 % to indicate reduced disability due to condition. Goal status: New   Pt will be able to amb c no device community surfaces with normalize gait pattern.  Goal status: New       5.  Pt will be able to navigate 1 flight of stairs with single hand rail with step  over step pattern.   Goal status: New   6. Pt will improve Left knee flexion to >/= 120 degrees to perform functional mobility.      A. Goal Status: New     PLAN: PT FREQUENCY:  2-3 x/ week    PT DURATION: 8 weeks   PLANNED INTERVENTIONS: Therapeutic exercises, Therapeutic activity, Neuromuscular re-education, Balance training, Gait training, Patient/Family education, Joint mobilization, Stair training,  Electrical stimulation, Cryotherapy, Moist heat, Taping, Vasopneumatic device, and Manual therapy  PLAN FOR NEXT SESSION: Manual therapy & exercise to improve range & knee function including Nustep, balance activities. vasopneumatic    Jamey Reas, PT, DPT 06/03/2022, 12:43 PM

## 2022-06-04 ENCOUNTER — Telehealth: Payer: Self-pay | Admitting: Orthopaedic Surgery

## 2022-06-04 ENCOUNTER — Other Ambulatory Visit: Payer: Self-pay | Admitting: Orthopaedic Surgery

## 2022-06-04 MED ORDER — OXYCODONE HCL 5 MG PO TABS: 5.0000 mg | ORAL_TABLET | Freq: Four times a day (QID) | ORAL | 0 refills | Status: DC | PRN

## 2022-06-04 NOTE — Telephone Encounter (Signed)
Pt came in asking for a refill of oxycodone. Please send to pharmacy on file. Pt phone number is [phone]

## 2022-06-05 ENCOUNTER — Encounter (HOSPITAL_BASED_OUTPATIENT_CLINIC_OR_DEPARTMENT_OTHER): Payer: Self-pay

## 2022-06-05 ENCOUNTER — Other Ambulatory Visit: Payer: Self-pay

## 2022-06-05 ENCOUNTER — Emergency Department (HOSPITAL_BASED_OUTPATIENT_CLINIC_OR_DEPARTMENT_OTHER)
Admission: EM | Admit: 2022-06-05 | Discharge: 2022-06-05 | Disposition: A | Discharge status: Home / Self Care | Payer: Medicare Other | Attending: Emergency Medicine | Admitting: Emergency Medicine

## 2022-06-05 ENCOUNTER — Emergency Department (HOSPITAL_BASED_OUTPATIENT_CLINIC_OR_DEPARTMENT_OTHER): Payer: Medicare Other

## 2022-06-05 DIAGNOSIS — R1012 Left upper quadrant pain: Secondary | ICD-10-CM | POA: Insufficient documentation

## 2022-06-05 DIAGNOSIS — R1011 Right upper quadrant pain: Secondary | ICD-10-CM | POA: Insufficient documentation

## 2022-06-05 DIAGNOSIS — R109 Unspecified abdominal pain: Secondary | ICD-10-CM

## 2022-06-05 LAB — COMPREHENSIVE METABOLIC PANEL
ALT: 14 U/L (ref 0–44)
AST: 19 U/L (ref 15–41)
Albumin: 4.2 g/dL (ref 3.5–5.0)
Alkaline Phosphatase: 70 U/L (ref 38–126)
Anion gap: 10 (ref 5–15)
BUN: 19 mg/dL (ref 8–23)
CO2: 25 mmol/L (ref 22–32)
Calcium: 9.8 mg/dL (ref 8.9–10.3)
Chloride: 99 mmol/L (ref 98–111)
Creatinine, Ser: 0.83 mg/dL (ref 0.44–1.00)
GFR, Estimated: >60 mL/min (ref >60)
Glucose, Bld: 97 mg/dL (ref 70–99)
Potassium: 4 mmol/L (ref 3.5–5.1)
Sodium: 134 mmol/L — ABNORMAL LOW (ref 135–145)
Total Bilirubin: 0.4 mg/dL (ref 0.3–1.2)
Total Protein: 7 g/dL (ref 6.5–8.1)

## 2022-06-05 LAB — URINALYSIS, ROUTINE W REFLEX MICROSCOPIC
Bilirubin Urine: NEGATIVE
Glucose, UA: NEGATIVE mg/dL
Hgb urine dipstick: NEGATIVE
Ketones, ur: NEGATIVE mg/dL
Leukocytes,Ua: NEGATIVE
Nitrite: NEGATIVE
Protein, ur: NEGATIVE mg/dL
Specific Gravity, Urine: 1.01 (ref 1.005–1.030)
pH: 7 (ref 5.0–8.0)

## 2022-06-05 LAB — CBC
HCT: 38.8 % (ref 36.0–46.0)
Hemoglobin: 12.6 g/dL (ref 12.0–15.0)
MCH: 30.4 pg (ref 26.0–34.0)
MCHC: 32.5 g/dL (ref 30.0–36.0)
MCV: 93.5 fL (ref 80.0–100.0)
Platelets: 357 10*3/uL (ref 150–400)
RBC: 4.15 MIL/uL (ref 3.87–5.11)
RDW: 13.5 % (ref 11.5–15.5)
WBC: 6.1 10*3/uL (ref 4.0–10.5)
nRBC: 0 % (ref 0.0–0.2)

## 2022-06-05 LAB — LIPASE, BLOOD: Lipase: 36 U/L (ref 11–51)

## 2022-06-05 MED ORDER — ONDANSETRON HCL 4 MG/2ML IJ SOLN
4.0000 mg | Freq: Once | INTRAMUSCULAR | Status: AC
  Administered 2022-06-05: 4 mg via INTRAVENOUS
  Filled 2022-06-05: qty 2

## 2022-06-05 MED ORDER — PANTOPRAZOLE SODIUM 20 MG PO TBEC: 20.0000 mg | DELAYED_RELEASE_TABLET | Freq: Every day | ORAL | 0 refills | Status: DC

## 2022-06-05 MED ORDER — MORPHINE SULFATE (PF) 4 MG/ML IV SOLN
4.0000 mg | Freq: Once | INTRAVENOUS | Status: AC
  Administered 2022-06-05: 4 mg via INTRAVENOUS
  Filled 2022-06-05: qty 1

## 2022-06-05 NOTE — ED Notes (Signed)
Reviewed AVS/discharge instruction with patient. Time allotted for and all questions answered. Patient is agreeable for d/c and escorted to ed exit by staff.  

## 2022-06-05 NOTE — ED Notes (Addendum)
Pt now returned from CT dept via stretcher;  no acute changes noted.   CT ABD/Pelvis results pending

## 2022-06-05 NOTE — ED Notes (Signed)
Pt now oob to hall bathroom; reports sudden onset of worsening abd cramping - pain and nausea med pending

## 2022-06-05 NOTE — ED Notes (Signed)
Patient transported to CT via stretcher escorted by Rad Tech   

## 2022-06-05 NOTE — ED Notes (Signed)
Pt ambulatory to room with personal cane from waiting room - reports ongoing lower abd pain described as aching - pt mentions at this time h/o diverticulitis "this year or last year" - pt reports pain is similar to when she had diverticulitis - denies n/v/d

## 2022-06-05 NOTE — ED Provider Notes (Signed)
[facility] EMERGENCY DEPT Provider Note   CSN: [id number] Arrival date & time: 06/05/22  1836     History  Chief Complaint  Patient presents with   Abdominal Pain    Erica Frank is a 81 y.o. female.  81 year old female presents with 4 days of abdominal discomfort.  Abdominal pain has been bilateral upper quadrants and worse with movement as well as eating.  No fever or emesis noted.  Has history of diverticulitis and feels this is similar.  Denies any black or bloody stools.  No treatment use prior to arrival       Home Medications Prior to Admission medications   Medication Sig Start Date End Date Taking? Authorizing Provider  acetaminophen (TYLENOL) 500 MG tablet Take 1,000 mg by mouth daily as needed (pain).    [provider]  Alirocumab (PRALUENT) 75 MG/ML SOAJ Inject 75 mg into the skin every 14 (fourteen) days. 02/03/22   Yates Decamp, MD  allopurinol (ZYLOPRIM) 100 MG tablet Take 100 mg by mouth daily. 11/24/21   [provider]  apixaban (ELIQUIS) 5 MG TABS tablet Take 1 tablet (5 mg total) by mouth 2 (two) times daily. 05/24/22   Yates Decamp, MD  BIOTIN PO Take 1 tablet by mouth daily. Hair, skin & nails    [provider]  CALCIUM PO Take 1 tablet by mouth daily.    [provider]  Cyanocobalamin (VITAMIN B-12 PO) Take 1 tablet by mouth See admin instructions. Take 1 tablet by mouth daily when able to remember    [provider]  DULoxetine (CYMBALTA) 60 MG capsule Take 60 mg by mouth See admin instructions. Take 60 mg by mouth daily PRN for knee pain 03/28/22   [provider]  ezetimibe-simvastatin (VYTORIN) 10-40 MG tablet TAKE 1 TABLET BY MOUTH DAILY Patient taking differently: Take 1 tablet by mouth at bedtime. 12/28/21   Yates Decamp, MD  isosorbide mononitrate (IMDUR) 60 MG 24 hr tablet TAKE 1 TABLET(60 MG) BY MOUTH DAILY Patient taking differently: Take 60 mg by mouth daily. 03/16/22   Yates Decamp,  MD  lisinopril (ZESTRIL) 5 MG tablet TAKE 1 TABLET(5 MG) BY MOUTH DAILY Patient taking differently: Take 5 mg by mouth at bedtime. 02/26/22   Yates Decamp, MD  methocarbamol (ROBAXIN) 500 MG tablet Take 1 tablet (500 mg total) by mouth every 6 (six) hours as needed for muscle spasms. 05/14/22   Kathryne Hitch, MD  nitroGLYCERIN (NITROSTAT) 0.4 MG SL tablet Place 1 tablet (0.4 mg total) under the tongue every 5 (five) minutes as needed for chest pain. 12/20/18   Georgeanna Lea, MD  oxyCODONE (OXY IR/ROXICODONE) 5 MG immediate release tablet Take 1-2 tablets (5-10 mg total) by mouth every 6 (six) hours as needed for moderate pain (pain score 4-6). 06/04/22   Kathryne Hitch, MD  Polyvinyl Alcohol-Povidone PF (REFRESH) 1.4-0.6 % SOLN Place 1 drop into both eyes daily as needed (Dry eyes).    [provider]  traMADol (ULTRAM) 50 MG tablet Take 100 mg by mouth daily. 03/31/22   [provider]      Allergies    Hydrocodone and Repatha [evolocumab]    Review of Systems   Review of Systems  All other systems reviewed and are negative.   Physical Exam Updated Vital Signs BP (!) 182/87   Pulse (!) 57   Temp (!) 97.5 F (36.4 C) (Oral)   Resp 20   Ht 1.524 m (5')  Wt 58.7 kg   SpO2 99%   BMI 25.27 kg/m  Physical Exam Vitals and nursing note reviewed.  Constitutional:      General: She is not in acute distress.    Appearance: Normal appearance. She is well-developed. She is not toxic-appearing.  HENT:     Head: Normocephalic and atraumatic.  Eyes:     General: Lids are normal.     Conjunctiva/sclera: Conjunctivae normal.     Pupils: Pupils are equal, round, and reactive to light.  Neck:     Thyroid: No thyroid mass.     Trachea: No tracheal deviation.  Cardiovascular:     Rate and Rhythm: Normal rate and regular rhythm.     Heart sounds: Normal heart sounds. No murmur heard.    No gallop.  Pulmonary:     Effort: Pulmonary effort is normal. No  respiratory distress.     Breath sounds: Normal breath sounds. No stridor. No decreased breath sounds, wheezing, rhonchi or rales.  Abdominal:     General: There is no distension.     Palpations: Abdomen is soft.     Tenderness: There is abdominal tenderness in the right upper quadrant and left upper quadrant. There is no rebound.  Musculoskeletal:        General: No tenderness. Normal range of motion.     Cervical back: Normal range of motion and neck supple.  Skin:    General: Skin is warm and dry.     Findings: No abrasion or rash.  Neurological:     Mental Status: She is alert and oriented to person, place, and time. Mental status is at baseline.     GCS: GCS eye subscore is 4. GCS verbal subscore is 5. GCS motor subscore is 6.     Cranial Nerves: No cranial nerve deficit.     Sensory: No sensory deficit.     Motor: Motor function is intact.  Psychiatric:        Attention and Perception: Attention normal.        Speech: Speech normal.        Behavior: Behavior normal.     ED Results / Procedures / Treatments   Labs (all labs ordered are listed, but only abnormal results are displayed) Labs Reviewed  COMPREHENSIVE METABOLIC PANEL - Abnormal; Notable for the following components:      Result Value   Sodium 134 (*)    All other components within normal limits  LIPASE, BLOOD  CBC  URINALYSIS, ROUTINE W REFLEX MICROSCOPIC    EKG None  Radiology CT Abdomen Pelvis Wo Contrast  Result Date: 06/05/2022 CLINICAL DATA:  Acute abdominal pain for several days, initial encounter EXAM: CT ABDOMEN AND PELVIS WITHOUT CONTRAST TECHNIQUE: Multidetector CT imaging of the abdomen and pelvis was performed following the standard protocol without IV contrast. RADIATION DOSE REDUCTION: This exam was performed according to the departmental dose-optimization program which includes automated exposure control, adjustment of the mA and/or kV according to patient size and/or use of iterative  reconstruction technique. COMPARISON:  09/18/2020 FINDINGS: Lower chest: No acute abnormality. Hepatobiliary: No focal liver abnormality is seen. No gallstones, gallbladder wall thickening, or biliary dilatation. Pancreas: Unremarkable. No pancreatic ductal dilatation or surrounding inflammatory changes. Spleen: Normal in size without focal abnormality. Adrenals/Urinary Tract: Adrenal glands are within normal limits. Kidneys are well visualized. No renal calculi or obstructive changes are seen. The bladder is partially distended. Stomach/Bowel: Scattered diverticular change of the colon is noted without evidence of diverticulitis. The  appendix is well visualized and within normal limits. Small bowel and stomach are unremarkable. Vascular/Lymphatic: Aortic atherosclerosis. No enlarged abdominal or pelvic lymph nodes. Reproductive: Status post hysterectomy. No adnexal masses. Other: No abdominal wall hernia or abnormality. No abdominopelvic ascites. Musculoskeletal: Degenerative changes of lumbar spine are noted. IMPRESSION: Diverticulosis without diverticulitis. No acute abnormality noted. Electronically Signed   By: Alcide Clever M.D.   On: 06/05/2022 21:36    Procedures Procedures    Medications Ordered in ED Medications  morphine (PF) 4 MG/ML injection 4 mg (has no administration in time range)  ondansetron (ZOFRAN) injection 4 mg (has no administration in time range)    ED Course/ Medical Decision Making/ A&P                           Medical Decision Making Amount and/or Complexity of Data Reviewed Labs: ordered. Radiology: ordered.  Risk Prescription drug management.   Patient presented with upper abdominal pain that is worse with movement or eating.  Concern for possible diverticular disease versus perforated viscus.  Abdominal CT formed and from interpretation showed no acute findings.  His labs are significant for normal urinalysis, lipase liver function test.  She has no leukocytosis  on CBC.  She responded well to IV morphine and feels much better.  Suspect some element of possible gastritis will place on PPI and she will follow-up with her doctor.  Turn precautions given.  This was discussed with her daughter at bedside who is in agreement to        Final Clinical Impression(s) / ED Diagnoses Final diagnoses:  None    Rx / DC Orders ED Discharge Orders     None         Lorre Nick, MD 06/05/22 2227

## 2022-06-05 NOTE — ED Triage Notes (Signed)
Patient here POV from Home.  Endorses ABD Pain that began 4 days PTA. Constant in [place] but Wavers in Intensity.   Mild Nausea at Times. No Diarrhea. No Emesis. Mild Constipation. No Fevers. No Urinary Symptoms. Located in Lower ABD and radiates to LLQ and Umbilical Area.  NAD noted during Triage. A&Ox4. GCS 15. Ambulatory with Cane.

## 2022-06-07 ENCOUNTER — Ambulatory Visit: Payer: Medicare Other | Admitting: Physical Therapy

## 2022-06-07 ENCOUNTER — Encounter: Payer: Self-pay | Admitting: Physical Therapy

## 2022-06-07 ENCOUNTER — Other Ambulatory Visit: Payer: Self-pay | Admitting: Internal Medicine

## 2022-06-07 DIAGNOSIS — R262 Difficulty in walking, not elsewhere classified: Secondary | ICD-10-CM

## 2022-06-07 DIAGNOSIS — M25562 Pain in left knee: Secondary | ICD-10-CM | POA: Diagnosis not present

## 2022-06-07 DIAGNOSIS — M25662 Stiffness of left knee, not elsewhere classified: Secondary | ICD-10-CM | POA: Diagnosis not present

## 2022-06-07 DIAGNOSIS — M6281 Muscle weakness (generalized): Secondary | ICD-10-CM | POA: Diagnosis not present

## 2022-06-07 DIAGNOSIS — R600 Localized edema: Secondary | ICD-10-CM

## 2022-06-07 DIAGNOSIS — Z1231 Encounter for screening mammogram for malignant neoplasm of breast: Secondary | ICD-10-CM

## 2022-06-07 NOTE — Therapy (Signed)
OUTPATIENT PHYSICAL THERAPY TREATMENT NOTE   Patient Name: Erica Frank MRN: [id number] DOB:1940-12-31, 81 y.o., female Today's Date: 06/07/2022  PCP: Chilton Greathouse, MD REFERRING PROVIDER: Kathryne Hitch, MD  END OF SESSION:   PT End of Session - 06/07/22 1144     Visit Number 3    Number of Visits 24    Date for PT Re-Evaluation 07/30/22    Authorization Type [employer] medicare    Progress Note Due on Visit 10    PT Start Time 1145    PT Stop Time 1230    PT Time Calculation (min) 45 min    Activity Tolerance Patient limited by pain;Patient tolerated treatment well    Behavior During Therapy [facility] for tasks assessed/performed             Past Medical History:  Diagnosis Date   Anginal pain (HCC) 1989   Anxiety    Arthritis    Atherosclerosis    Atrial fibrillation (HCC)    Coronary artery disease    Dysrhythmia    a-fib   Elevated liver enzymes    GERD (gastroesophageal reflux disease)    Heart attack (HCC) 2020   Heart disease    High cholesterol    Hypertension    NSTEMI (non-ST elevated myocardial infarction) (HCC) 07/09/2019   Past Surgical History:  Procedure Laterality Date   ABDOMINAL HYSTERECTOMY  1983   for endometriosis with Burch   ANGIOPLASTY     ANGIOPLASTY     BACK SURGERY     1990   CARDIAC SURGERY     Catheterization   CORONARY STENT INTERVENTION N/A 07/09/2019   Procedure: CORONARY STENT INTERVENTION;  Surgeon: Yates Decamp, MD;  [facility]: [facility];  Service: Cardiovascular;  Laterality: N/A;   LEFT HEART CATH AND CORONARY ANGIOGRAPHY N/A 07/09/2019   Procedure: LEFT HEART CATH AND CORONARY ANGIOGRAPHY and possible intervention;  Surgeon: Yates Decamp, MD;  Location: [facility];  Service: Cardiovascular;  Laterality: N/A;  4: or 4:30 PM today   LUMBAR LAMINECTOMY/DECOMPRESSION MICRODISCECTOMY Left 02/02/2022   Procedure: Left Lumbar two-three, Lumbar five-Sacral one Sublaminar decompression;  Surgeon: Barnett Abu,  MD;  Location: [facility];  Service: Neurosurgery;  Laterality: Left;   NECK SURGERY     TOTAL KNEE ARTHROPLASTY Left 10/07/2020   Procedure: TOTAL KNEE ARTHROPLASTY;  Surgeon: Durene Romans, MD;  Location: [facility];  Service: Orthopedics;  Laterality: Left;  70 mins   TOTAL KNEE REVISION Left 05/13/2022   Procedure: SYNOVECTOMY AND LEFT KNEE POLY-LINER EXCHANGE;  Surgeon: Kathryne Hitch, MD;  Location: [facility];  Service: Orthopedics;  Laterality: Left;   Patient Active Problem List   Diagnosis Date Noted   Status post revision of total replacement of left knee 05/13/2022   Degenerative lumbar spinal stenosis 02/02/2022   S/P left TKA 10/07/2020   Left knee OA 10/07/2020   Syncope 01/16/2020   Anemia 01/16/2020   Thrombocytopenia (HCC) 01/16/2020   Paroxysmal atrial fibrillation (HCC) 02/15/2019   Mixed hyperlipidemia 02/15/2019   Bradycardia 02/15/2019   Coronary artery disease involving native coronary artery of native heart with unstable angina pectoris (HCC) 12/20/2018   Dyslipidemia 12/20/2018   Essential hypertension 12/20/2018    REFERRING DIAG: E70.350 (ICD-10-CM) - Status post revision of total knee, left  ONSET DATE: 05/13/2022  THERAPY DIAG:  Acute pain of left knee  Stiffness of left knee, not elsewhere classified  Muscle weakness (generalized)  Localized edema  Difficulty in walking, not elsewhere classified  Rationale for Evaluation and Treatment Rehabilitation  PERTINENT HISTORY: HTN, MI, A-fib, CAD, L lumbar laminectomy, atherosclerosis, angina, HTN  PRECAUTIONS: None  SUBJECTIVE: she relays knee pain and stiffness in left knee  PAIN:  Are you having pain? Yes: NPRS scale: 7/10 today overall Pain location: left knee more in front Pain description: achy & burning, tightness Aggravating factors: exercising bending knee Relieving factors: laying down, meds & ice  OBJECTIVE: (objective measures completed at initial evaluation unless otherwise  dated)  OBJECTIVE:    PATIENT SURVEYS:  05/31/22: FOTO 36% (predicted 51%)                       SENSATION: 06/01/2022:  WFL   EDEMA:  06/01/2022:  Circumferential: Rt knee : 37.5 centimeters 06/01/2022:  Circumferential: Left knee: 39 centimeters   MUSCLE LENGTH: 06/01/2022:  Hamstrings: Right 86 deg; Left 75 deg  POSTURE: 06/01/2022:  rounded shoulders and forward head  PALPATION: 06/01/2022:  Tender on medial and lateral joint line with more pain reported on medial side  LOWER EXTREMITY ROM:  Active ROM Right Eval 06/01/22 Left Eval 06/01/22 06/07/22  Hip flexion       Hip extension       Hip abduction       Hip adduction       Knee flexion 125 80 90 AAROM   Knee extension 0 -4 -4   (Blank rows = not tested)   Passive ROM Left Eval 06/01/22  Hip flexion    Hip extension    Hip abduction    Hip adduction    Knee flexion 88  Knee extension -2       LOWER EXTREMITY MMT:  MMT Right Eval 06/01/22 Left Eval 06/01/22  Hip flexion 5/5 4/5  Hip extension      Hip abduction 4/5 4/5  Hip adduction 4/5 4/5  Hip internal rotation      Hip external rotation      Knee flexion 5/5 2/5  Knee extension 5/5 2/5  Ankle dorsiflexion 5/5 5/5  Ankle plantarflexion 5/5 5/5  Ankle inversion      Ankle eversion       (Blank rows = not tested)       FUNCTIONAL TESTS:  06/01/2022:  5 times sit to stand: 15 seconds with UE support   GAIT: 06/01/2022:  Distance walked: 50 feet  Assistive device utilized: Environmental consultant - 2 wheeled Level of assistance: Modified independence Comments: antalgic gait pattern, decreased left knee flexion and hip flexion       TODAY'S TREATMENT: 06/07/22 -Nu step L5 X 8 min UE/LE -Seated knee flexion AAROM stretch 5 sec X15 -Seated LAQ on left 2X10 -Seated hamstring curl red 2X10 -supine quad sets 5 sec X10 -Supine AAROM heelslides 5 sec X10 -Supine heel prop for knee extension stretch 3 min  -manual therpay: Left knee PROM with overpressure to  tolerance, manual hamstring stretching  -modalaties: vasopnuematic X 10 min to left    06/03/2022: Pt reported intestinal issues requiring 3 trips to bathroom during session. Pt unable to tolerate activities after using Nustep except to work on gait to/from bathroom.   Therapeutic Exercise:  Aerobic: Nustep seat 5 with BLEs/BUEs level 5 for 8 min.  Supine: Prone:  Seated:  Standing: Neuromuscular Re-education: Manual Therapy: Therapeutic Activity:  PT demo & verbal cues on weight shifting onto LLE with pelvic movements for 5-10 times upon arising prior to walking. Pt return demo & verbalized understanding. Her gait with  RW had less decrease in LLE stance duration. Pt ambulated 50' X 2 with cane. Initially with contact assist progressing to supervision.  No balance issues noted.  Self Care: Trigger Point Dry Needling:  Modalities:          PATIENT EDUCATION:  Education details: PT POC, HEP Person educated: Patient Education method: Explanation, Demonstration, Tactile cues, Verbal cues, and Handouts Education comprehension: verbalized understanding, returned demonstration, and needs further education     HOME EXERCISE PROGRAM: Access Code: [email or web address] URL: [email or web address] Date: 06/01/2022 Prepared by: Narda Amber   Exercises - Supine Straight Leg Raises  - 3 x daily - 7 x weekly - 2 sets - 10 reps - Supine Heel Slide with Strap  - 3 x daily - 7 x weekly - 2 sets - 10 reps - Long Sitting Quad Set with Towel Roll Under Heel  - 3 x daily - 7 x weekly - 2 sets - 10 reps - 5 seconds hold - Seated Long Arc Quad  - 3 x daily - 7 x weekly - 2 sets - 10 reps - 3 seconds hold - Seated Knee Flexion AAROM  - 3 x daily - 7 x weekly - 10 reps - 10 seconds hold   ASSESSMENT:   CLINICAL IMPRESSION: She had to go to ER last week due to her stomach pain and intestinal issues. She relays she feels better now and session was not limited by any bathroom breaks today. I  did work to improve her left knee ROM and strength within her limited activity tolerance. She will continue to benefit from skilled PT.   OBJECTIVE IMPAIRMENTS Abnormal gait, decreased activity tolerance, decreased balance, decreased mobility, difficulty walking, decreased ROM, decreased strength, increased edema, impaired flexibility, and pain.    ACTIVITY LIMITATIONS lifting, bending, sitting, standing, sleeping, stairs, transfers, and dressing   PARTICIPATION LIMITATIONS: cleaning, driving, and shopping   PERSONAL FACTORS   HTN, MI, A-fib, CAD, L lumbar laminectomy, atherosclerosis, angina, HTN,  are also affecting patient's functional outcome.    REHAB POTENTIAL: Good   CLINICAL DECISION MAKING: Stable/uncomplicated   EVALUATION COMPLEXITY: Low     GOALS: Goals reviewed with patient? Yes   SHORT TERM GOALS: Target date: 07/06/2022  Patient will demonstrate independent use of initial home exercise program to maintain progress from in clinic treatments. Goal status: ongoing         2. Pt will be able to navigate curb step with st cane modified independently.                    A. Goal Status: ongoing        Long term PT goals (target dates for all long term goals are 8 weeks  07/30/2022 ) Patient will demonstrate/report pain at worst less than or equal to 2/10 with ADL's.  Goal status: New   Patient will demonstrate independent use of home exercise program to facilitate ability to maintain/progress functional gains from skilled physical therapy services. Goal status: New   Patient will demonstrate FOTO outcome > or = 51 % to indicate reduced disability due to condition. Goal status: New   Pt will be able to amb c no device community surfaces with normalize gait pattern.  Goal status: New       5.  Pt will be able to navigate 1 flight of stairs with single hand rail with step  over step pattern.   Goal status: New   6. Pt will improve Left knee flexion  to >/= 120 degrees to  perform functional mobility.      A. Goal Status: New     PLAN: PT FREQUENCY:  2-3 x/ week    PT DURATION: 8 weeks   PLANNED INTERVENTIONS: Therapeutic exercises, Therapeutic activity, Neuromuscular re-education, Balance training, Gait training, Patient/Family education, Joint mobilization, Stair training, Electrical stimulation, Cryotherapy, Moist heat, Taping, Vasopneumatic device, and Manual therapy   PLAN FOR NEXT SESSION: Manual therapy & exercise to improve range & knee strength, vasopneumatic if desired.    April Manson, PT, DPT 06/07/2022, 11:45 AM

## 2022-06-09 ENCOUNTER — Ambulatory Visit: Payer: Medicare Other | Admitting: Rehabilitative and Restorative Service Providers"

## 2022-06-09 ENCOUNTER — Encounter: Payer: Self-pay | Admitting: Rehabilitative and Restorative Service Providers"

## 2022-06-09 DIAGNOSIS — M6281 Muscle weakness (generalized): Secondary | ICD-10-CM

## 2022-06-09 DIAGNOSIS — R600 Localized edema: Secondary | ICD-10-CM | POA: Diagnosis not present

## 2022-06-09 DIAGNOSIS — M25662 Stiffness of left knee, not elsewhere classified: Secondary | ICD-10-CM | POA: Diagnosis not present

## 2022-06-09 DIAGNOSIS — R262 Difficulty in walking, not elsewhere classified: Secondary | ICD-10-CM

## 2022-06-09 DIAGNOSIS — M25562 Pain in left knee: Secondary | ICD-10-CM

## 2022-06-09 NOTE — Therapy (Addendum)
OUTPATIENT PHYSICAL THERAPY TREATMENT NOTE   Patient Name: Erica Frank MRN: [id number] DOB:[birth year], 81 y.o., female Today's Date: 06/09/2022  PCP: Chilton Greathouse, MD REFERRING PROVIDER: Kathryne Hitch, MD  END OF SESSION:   PT End of Session - 06/09/22 1452     Visit Number 4    Number of Visits 24    Date for PT Re-Evaluation 07/30/22    Authorization Type [employer] medicare    Progress Note Due on Visit 10    PT Start Time 1423    PT Stop Time 1513    PT Time Calculation (min) 50 min    Activity Tolerance Patient tolerated treatment well    Behavior During Therapy WFL for tasks assessed/performed              Past Medical History:  Diagnosis Date   Anginal pain (HCC) 1989   Anxiety    Arthritis    Atherosclerosis    Atrial fibrillation (HCC)    Coronary artery disease    Dysrhythmia    a-fib   Elevated liver enzymes    GERD (gastroesophageal reflux disease)    Heart attack (HCC) 2020   Heart disease    High cholesterol    Hypertension    NSTEMI (non-ST elevated myocardial infarction) (HCC) 07/09/2019   Past Surgical History:  Procedure Laterality Date   ABDOMINAL HYSTERECTOMY  1983   for endometriosis with Burch   ANGIOPLASTY     ANGIOPLASTY     BACK SURGERY     1990   CARDIAC SURGERY     Catheterization   CORONARY STENT INTERVENTION N/A 07/09/2019   Procedure: CORONARY STENT INTERVENTION;  Surgeon: Yates Decamp, MD;  Location: [facility];  Service: Cardiovascular;  Laterality: N/A;   LEFT HEART CATH AND CORONARY ANGIOGRAPHY N/A 07/09/2019   Procedure: LEFT HEART CATH AND CORONARY ANGIOGRAPHY and possible intervention;  Surgeon: Yates Decamp, MD;  Location: [facility];  Service: Cardiovascular;  Laterality: N/A;  4: or 4:30 PM today   LUMBAR LAMINECTOMY/DECOMPRESSION MICRODISCECTOMY Left 02/02/2022   Procedure: Left Lumbar two-three, Lumbar five-Sacral one Sublaminar decompression;  Surgeon: Barnett Abu, MD;  Location: [facility];   Service: Neurosurgery;  Laterality: Left;   NECK SURGERY     TOTAL KNEE ARTHROPLASTY Left 10/07/2020   Procedure: TOTAL KNEE ARTHROPLASTY;  Surgeon: Durene Romans, MD;  Location: [facility];  Service: Orthopedics;  Laterality: Left;  70 mins   TOTAL KNEE REVISION Left 05/13/2022   Procedure: SYNOVECTOMY AND LEFT KNEE POLY-LINER EXCHANGE;  Surgeon: Kathryne Hitch, MD;  Location: [facility];  Service: Orthopedics;  Laterality: Left;   Patient Active Problem List   Diagnosis Date Noted   Status post revision of total replacement of left knee 05/13/2022   Degenerative lumbar spinal stenosis 02/02/2022   S/P left TKA 10/07/2020   Left knee OA 10/07/2020   Syncope 01/16/2020   Anemia 01/16/2020   Thrombocytopenia (HCC) 01/16/2020   Paroxysmal atrial fibrillation (HCC) 02/15/2019   Mixed hyperlipidemia 02/15/2019   Bradycardia 02/15/2019   Coronary artery disease involving native coronary artery of native heart with unstable angina pectoris (HCC) 12/20/2018   Dyslipidemia 12/20/2018   Essential hypertension 12/20/2018    REFERRING DIAG: A19.379 (ICD-10-CM) - Status post revision of total knee, left  ONSET DATE: 05/13/2022  THERAPY DIAG:  Acute pain of left knee  Stiffness of left knee, not elsewhere classified  Muscle weakness (generalized)  Localized edema  Difficulty in walking, not elsewhere classified  Rationale  for Evaluation and Treatment Rehabilitation  PERTINENT HISTORY: HTN, MI, A-fib, CAD, L lumbar laminectomy, atherosclerosis, angina, HTN  PRECAUTIONS: None  SUBJECTIVE: Pt indicated pain in knee staying around 6/10 when hurting.  Reported it comes and goes sometimes.   PAIN:   NPRS scale: 6/10 today overall Pain location: left knee more in front/medial Pain description: achy & burning, tightness Aggravating factors: exercising bending knee Relieving factors: laying down, meds & ice  OBJECTIVE: (objective measures completed at initial evaluation unless otherwise  dated)    PATIENT SURVEYS:  05/31/22: FOTO 36% (predicted 51%)                       SENSATION: 06/01/2022:  WFL   EDEMA:  06/01/2022:  Circumferential: Rt knee : 37.5 centimeters 06/01/2022:  Circumferential: Left knee: 39 centimeters   MUSCLE LENGTH: 06/01/2022:  Hamstrings: Right 86 deg; Left 75 deg  POSTURE: 06/01/2022:  rounded shoulders and forward head  PALPATION: 06/01/2022:  Tender on medial and lateral joint line with more pain reported on medial side  LOWER EXTREMITY ROM:  Active ROM Right Eval 06/01/22 Left Eval 06/01/22 06/07/22 06/09/2022  Hip flexion        Hip extension        Hip abduction        Hip adduction        Knee flexion 125 80 90 AAROM  Supine AROM heel slide: 97  Knee extension 0 -4 -4    (Blank rows = not tested)   Passive ROM Left Eval 06/01/22  Hip flexion    Hip extension    Hip abduction    Hip adduction    Knee flexion 88  Knee extension -2       LOWER EXTREMITY MMT:  MMT Right Eval 06/01/22 Left Eval 06/01/22  Hip flexion 5/5 4/5  Hip extension      Hip abduction 4/5 4/5  Hip adduction 4/5 4/5  Hip internal rotation      Hip external rotation      Knee flexion 5/5 2/5  Knee extension 5/5 2/5  Ankle dorsiflexion 5/5 5/5  Ankle plantarflexion 5/5 5/5  Ankle inversion      Ankle eversion       (Blank rows = not tested)       FUNCTIONAL TESTS:  06/01/2022:  5 times sit to stand: 15 seconds with UE support   GAIT: 06/01/2022:  Distance walked: 50 feet  Assistive device utilized: Environmental consultant - 2 wheeled Level of assistance: Modified independence Comments: antalgic gait pattern, decreased left knee flexion and hip flexion     TODAY'S TREATMENT: 06/09/2022: Therex:  Nustep lvl 5 8 mins UE/LE  Supine AROM heel slide Lt x 10  Supine Lt quad set 5 sec hold x 5  Supine SLR Lt 2 x 5   Seated Lt leg LAQ c end range pause in extension and flexion x 15   Neuro Re-ed:  Church pew anterior/posterior ankle weight shift c SBA 2  mins  Retro step Lt leg posterior x 15 c SBA  Manual:  Seated Lt knee flexion/distraction/IR mobilization c movement for flexion mobility. Contract/relax for Lt quad into flexion in sitting  modalaties: vasopnuematic X 10 min to left   06/07/22 -Nu step L5 X 8 min UE/LE -Seated knee flexion AAROM stretch 5 sec X15 -Seated LAQ on left 2X10 -Seated hamstring curl red 2X10 -supine quad sets 5 sec X10 -Supine AAROM heelslides 5 sec X10 -Supine heel prop  for knee extension stretch 3 min  -manual therpay: Left knee PROM with overpressure to tolerance, manual hamstring stretching  -modalaties: vasopnuematic X 10 min to left    06/03/2022: Pt reported intestinal issues requiring 3 trips to bathroom during session. Pt unable to tolerate activities after using Nustep except to work on gait to/from bathroom.   Therapeutic Exercise:  Aerobic: Nustep seat 5 with BLEs/BUEs level 5 for 8 min.  Supine: Prone:  Seated:  Standing: Neuromuscular Re-education: Manual Therapy: Therapeutic Activity:  PT demo & verbal cues on weight shifting onto LLE with pelvic movements for 5-10 times upon arising prior to walking. Pt return demo & verbalized understanding. Her gait with RW had less decrease in LLE stance duration. Pt ambulated 50' X 2 with cane. Initially with contact assist progressing to supervision.  No balance issues noted.  Self Care: Trigger Point Dry Needling:  Modalities:          PATIENT EDUCATION:  Education details: PT POC, HEP Person educated: Patient Education method: Explanation, Demonstration, Tactile cues, Verbal cues, and Handouts Education comprehension: verbalized understanding, returned demonstration, and needs further education     HOME EXERCISE PROGRAM: Access Code: [email or web address] URL: [email or web address] Date: 06/01/2022 Prepared by: Narda Amber   Exercises - Supine Straight Leg Raises  - 3 x daily - 7 x weekly - 2 sets - 10 reps - Supine Heel  Slide with Strap  - 3 x daily - 7 x weekly - 2 sets - 10 reps - Long Sitting Quad Set with Towel Roll Under Heel  - 3 x daily - 7 x weekly - 2 sets - 10 reps - 5 seconds hold - Seated Long Arc Quad  - 3 x daily - 7 x weekly - 2 sets - 10 reps - 3 seconds hold - Seated Knee Flexion AAROM  - 3 x daily - 7 x weekly - 10 reps - 10 seconds hold   ASSESSMENT:   CLINICAL IMPRESSION: Quality of flexion mobility continued to improve.  Extension restriction noted.  Fair control on standing balance intervention.  Continued skilled Pt services indicated at this time.    OBJECTIVE IMPAIRMENTS Abnormal gait, decreased activity tolerance, decreased balance, decreased mobility, difficulty walking, decreased ROM, decreased strength, increased edema, impaired flexibility, and pain.    ACTIVITY LIMITATIONS lifting, bending, sitting, standing, sleeping, stairs, transfers, and dressing   PARTICIPATION LIMITATIONS: cleaning, driving, and shopping   PERSONAL FACTORS   HTN, MI, A-fib, CAD, L lumbar laminectomy, atherosclerosis, angina, HTN,  are also affecting patient's functional outcome.    REHAB POTENTIAL: Good   CLINICAL DECISION MAKING: Stable/uncomplicated   EVALUATION COMPLEXITY: Low     GOALS: Goals reviewed with patient? Yes   SHORT TERM GOALS: Target date: 07/06/2022  Patient will demonstrate independent use of initial home exercise program to maintain progress from in clinic treatments. Goal status: ongoing         2. Pt will be able to navigate curb step with st cane modified independently.                    A. Goal Status: ongoing        Long term PT goals (target dates for all long term goals are 8 weeks  07/30/2022 ) Patient will demonstrate/report pain at worst less than or equal to 2/10 with ADL's.  Goal status: New   Patient will demonstrate independent use of home exercise program to facilitate ability to maintain/progress functional gains from skilled physical therapy  services. Goal status: New   Patient will demonstrate FOTO outcome > or = 51 % to indicate reduced disability due to condition. Goal status: New   Pt will be able to amb c no device community surfaces with normalize gait pattern.  Goal status: New       5.  Pt will be able to navigate 1 flight of stairs with single hand rail with step  over step pattern.   Goal status: New   6. Pt will improve Left knee flexion to >/= 120 degrees to perform functional mobility.      A. Goal Status: New     PLAN: PT FREQUENCY:  2-3 x/ week    PT DURATION: 8 weeks   PLANNED INTERVENTIONS: Therapeutic exercises, Therapeutic activity, Neuromuscular re-education, Balance training, Gait training, Patient/Family education, Joint mobilization, Stair training, Electrical stimulation, Cryotherapy, Moist heat, Taping, Vasopneumatic device, and Manual therapy   PLAN FOR NEXT SESSION: Manual and therex for mobility gains, strengthening.  Early balance improvements.  Possible shift to 2x/week soon    Chyrel Masson, PT, DPT, OCS, ATC 06/09/22  3:06 PM

## 2022-06-10 ENCOUNTER — Encounter: Payer: Medicare Other | Admitting: Rehabilitative and Restorative Service Providers"

## 2022-06-11 ENCOUNTER — Ambulatory Visit: Payer: Medicare Other | Admitting: Rehabilitative and Restorative Service Providers"

## 2022-06-11 ENCOUNTER — Encounter: Payer: Self-pay | Admitting: Rehabilitative and Restorative Service Providers"

## 2022-06-11 DIAGNOSIS — M25562 Pain in left knee: Secondary | ICD-10-CM | POA: Diagnosis not present

## 2022-06-11 DIAGNOSIS — M25662 Stiffness of left knee, not elsewhere classified: Secondary | ICD-10-CM

## 2022-06-11 DIAGNOSIS — R600 Localized edema: Secondary | ICD-10-CM | POA: Diagnosis not present

## 2022-06-11 DIAGNOSIS — M6281 Muscle weakness (generalized): Secondary | ICD-10-CM

## 2022-06-11 DIAGNOSIS — R262 Difficulty in walking, not elsewhere classified: Secondary | ICD-10-CM

## 2022-06-11 NOTE — Therapy (Signed)
[facility] Physical Therapy [address] [place], [place], [address] Phone: [phone]   Fax:  [phone]  Patient Details  Name: Erica Frank MRN: [id number] Date of Birth: 1941/06/17 Referring Provider:  No ref. provider found  Encounter Date: 06/11/2022    OUTPATIENT PHYSICAL THERAPY TREATMENT NOTE   Patient Name: Erica Frank MRN: [id number] DOB:1941/09/02, 81 y.o., female Today's Date: 06/11/2022  PCP: Chilton Greathouse, MD REFERRING PROVIDER: Kathryne Hitch, MD  END OF SESSION:   PT End of Session - 06/11/22 0940     Visit Number 5    Number of Visits 24    Date for PT Re-Evaluation 07/30/22    Authorization Type UHC medicare    Progress Note Due on Visit 10    PT Start Time 0935    PT Stop Time 1030    PT Time Calculation (min) 55 min    Activity Tolerance Patient tolerated treatment well;No increased pain    Behavior During Therapy [facility] for tasks assessed/performed               Past Medical History:  Diagnosis Date   Anginal pain (HCC) 1989   Anxiety    Arthritis    Atherosclerosis    Atrial fibrillation (HCC)    Coronary artery disease    Dysrhythmia    a-fib   Elevated liver enzymes    GERD (gastroesophageal reflux disease)    Heart attack (HCC) 2020   Heart disease    High cholesterol    Hypertension    NSTEMI (non-ST elevated myocardial infarction) (HCC) 07/09/2019   Past Surgical History:  Procedure Laterality Date   ABDOMINAL HYSTERECTOMY  1983   for endometriosis with Burch   ANGIOPLASTY     ANGIOPLASTY     BACK SURGERY     1990   CARDIAC SURGERY     Catheterization   CORONARY STENT INTERVENTION N/A 07/09/2019   Procedure: CORONARY STENT INTERVENTION;  Surgeon: Yates Decamp, MD;  [facility]: [facility] INV[facility];  Service: Cardiovascular;  Laterality: N/A;   LEFT HEART CATH AND CORONARY ANGIOGRAPHY N/A 07/09/2019   Procedure: LEFT HEART CATH AND CORONARY ANGIOGRAPHY and possible intervention;   Surgeon: Yates Decamp, MD;  Location: [facility] INV[facility] CV LAB;  Service: Cardiovascular;  Laterality: N/A;  4: or 4:30 PM today   LUMBAR LAMINECTOMY/DECOMPRESSION MICRODISCECTOMY Left 02/02/2022   Procedure: Left Lumbar two-three, Lumbar five-Sacral one Sublaminar decompression;  Surgeon: Barnett Abu, MD;  Location: [facility] OR;  Service: Neurosurgery;  Laterality: Left;   NECK SURGERY     TOTAL KNEE ARTHROPLASTY Left 10/07/2020   Procedure: TOTAL KNEE ARTHROPLASTY;  Surgeon: Durene Romans, MD;  Location: [facility] ORS;  Service: Orthopedics;  Laterality: Left;  70 mins   TOTAL KNEE REVISION Left 05/13/2022   Procedure: SYNOVECTOMY AND LEFT KNEE POLY-LINER EXCHANGE;  Surgeon: Kathryne Hitch, MD;  Location: [facility] OR;  Service: Orthopedics;  Laterality: Left;   Patient Active Problem List   Diagnosis Date Noted   Status post revision of total replacement of left knee 05/13/2022   Degenerative lumbar spinal stenosis 02/02/2022   S/P left TKA 10/07/2020   Left knee OA 10/07/2020   Syncope 01/16/2020   Anemia 01/16/2020   Thrombocytopenia (HCC) 01/16/2020   Paroxysmal atrial fibrillation (HCC) 02/15/2019   Mixed hyperlipidemia 02/15/2019   Bradycardia 02/15/2019   Coronary artery disease involving native coronary artery of native heart with unstable angina pectoris (HCC) 12/20/2018   Dyslipidemia 12/20/2018   Essential hypertension 12/20/2018  REFERRING DIAG: B93.903 (ICD-10-CM) - Status post revision of total knee, left  ONSET DATE: 05/13/2022  THERAPY DIAG:  Acute pain of left knee  Stiffness of left knee, not elsewhere classified  Muscle weakness (generalized)  Localized edema  Difficulty in walking, not elsewhere classified  Rationale for Evaluation and Treatment Rehabilitation  PERTINENT HISTORY: HTN, MI, A-fib, CAD, L lumbar laminectomy, atherosclerosis, angina, HTN  PRECAUTIONS: None  SUBJECTIVE: Pt indicated pain in knee staying around 6/10 when hurting.  Reported it comes and  goes sometimes. Pt reports she thinks she is overdoing it at home b/c of the level of soreness she keeps having. She states she is stretching her knee on the step, attempted the bike, walking, doing HEP more. PT advised her to not perform the step stretch yet and do the HEP only the prescribed frequency.   PAIN:   NPRS scale: 6/10 today overall Pain location: left knee more in front/medial Pain description: achy & burning, tightness Aggravating factors: exercising bending knee Relieving factors: laying down, meds & ice  OBJECTIVE: (objective measures completed at initial evaluation unless otherwise dated)    PATIENT SURVEYS:  05/31/22: FOTO 36% (predicted 51%)                       SENSATION: 06/01/2022:  WFL   EDEMA:  06/01/2022:  Circumferential: Rt knee : 37.5 centimeters 06/01/2022:  Circumferential: Left knee: 39 centimeters   MUSCLE LENGTH: 06/01/2022:  Hamstrings: Right 86 deg; Left 75 deg  POSTURE: 06/01/2022:  rounded shoulders and forward head  PALPATION: 06/01/2022:  Tender on medial and lateral joint line with more pain reported on medial side  LOWER EXTREMITY ROM:  Active ROM Right Eval 06/01/22 Left Eval 06/01/22 06/07/22 06/09/2022  Hip flexion        Hip extension        Hip abduction        Hip adduction        Knee flexion 125 80 90 AAROM  Supine AROM heel slide: 97  Knee extension 0 -4 -4    (Blank rows = not tested)   Passive ROM Left Eval 06/01/22  Hip flexion    Hip extension    Hip abduction    Hip adduction    Knee flexion 88  Knee extension -2       LOWER EXTREMITY MMT:  MMT Right Eval 06/01/22 Left Eval 06/01/22  Hip flexion 5/5 4/5  Hip extension      Hip abduction 4/5 4/5  Hip adduction 4/5 4/5  Hip internal rotation      Hip external rotation      Knee flexion 5/5 2/5  Knee extension 5/5 2/5  Ankle dorsiflexion 5/5 5/5  Ankle plantarflexion 5/5 5/5  Ankle inversion      Ankle eversion       (Blank rows = not tested)        FUNCTIONAL TESTS:  06/01/2022:  5 times sit to stand: 15 seconds with UE support   GAIT: 06/01/2022:  Distance walked: 50 feet  Assistive device utilized: Environmental consultant - 2 wheeled Level of assistance: Modified independence Comments: antalgic gait pattern, decreased left knee flexion and hip flexion     TODAY'S TREATMENT:  [facility] Adult PT Treatment:  DATE: 06/11/22 Therapeutic Exercise: NuStep LEs only level 3 with concentration on knee extension STS from EOM x 15, x 5 with concentration on knee ROM and control Quad sets x 20 with knee ext -14 at end SLR x 15 Heel slides x 10 Max L knee flexion w/bridge 2x10 Heel slide with static hold 5x30 sec Seated EOM 2x30 sec hamstring stretch L Manual Therapy: Knee ext mob supine grade 2 to pt tolerance  Modalities: vasopnuematic X 15 min to left low compression 3 snowflakes   06/09/2022: Therex:  Nustep lvl 5 8 mins UE/LE  Supine AROM heel slide Lt x 10  Supine Lt quad set 5 sec hold x 5  Supine SLR Lt 2 x 5   Seated Lt leg LAQ c end range pause in extension and flexion x 15   Neuro Re-ed:  Church pew anterior/posterior ankle weight shift c SBA 2 mins  Retro step Lt leg posterior x 15 c SBA  Manual:  Seated Lt knee flexion/distraction/IR mobilization c movement for flexion mobility. Contract/relax for Lt quad into flexion in sitting  modalaties: vasopnuematic X 10 min to left   06/07/22 -Nu step L5 X 8 min UE/LE -Seated knee flexion AAROM stretch 5 sec X15 -Seated LAQ on left 2X10 -Seated hamstring curl red 2X10 -supine quad sets 5 sec X10 -Supine AAROM heelslides 5 sec X10 -Supine heel prop for knee extension stretch 3 min  -manual therpay: Left knee PROM with overpressure to tolerance, manual hamstring stretching  -modalaties: vasopnuematic X 10 min to left    06/03/2022: Pt reported intestinal issues requiring 3 trips to bathroom during session. Pt unable to tolerate activities  after using Nustep except to work on gait to/from bathroom.   Therapeutic Exercise:  Aerobic: Nustep seat 5 with BLEs/BUEs level 5 for 8 min.  Supine: Prone:  Seated:  Standing: Neuromuscular Re-education: Manual Therapy: Therapeutic Activity:  PT demo & verbal cues on weight shifting onto LLE with pelvic movements for 5-10 times upon arising prior to walking. Pt return demo & verbalized understanding. Her gait with RW had less decrease in LLE stance duration. Pt ambulated 50' X 2 with cane. Initially with contact assist progressing to supervision.  No balance issues noted.  Self Care: Trigger Point Dry Needling:  Modalities:          PATIENT EDUCATION:  Education details: PT POC, HEP Person educated: Patient Education method: Explanation, Demonstration, Tactile cues, Verbal cues, and Handouts Education comprehension: verbalized understanding, returned demonstration, and needs further education     HOME EXERCISE PROGRAM: Access Code: [email or web address] URL: [email or web address] Date: 06/01/2022 Prepared by: Narda Amber   Exercises - Supine Straight Leg Raises  - 3 x daily - 7 x weekly - 2 sets - 10 reps - Supine Heel Slide with Strap  - 3 x daily - 7 x weekly - 2 sets - 10 reps - Long Sitting Quad Set with Towel Roll Under Heel  - 3 x daily - 7 x weekly - 2 sets - 10 reps - 5 seconds hold - Seated Long Arc Quad  - 3 x daily - 7 x weekly - 2 sets - 10 reps - 3 seconds hold - Seated Knee Flexion AAROM  - 3 x daily - 7 x weekly - 10 reps - 10 seconds hold   ASSESSMENT:   CLINICAL IMPRESSION: Quality of flexion mobility continued to improve.  Continued extension restriction noted.  Ext AROM supine -14 initially progressing to -9, flexion 90 degrees. Continued skilled  Pt services indicated at this time. Pt encouraged to not do too much at home as she is overexercising and increasing her soreness.    OBJECTIVE IMPAIRMENTS Abnormal gait, decreased activity tolerance,  decreased balance, decreased mobility, difficulty walking, decreased ROM, decreased strength, increased edema, impaired flexibility, and pain.    ACTIVITY LIMITATIONS lifting, bending, sitting, standing, sleeping, stairs, transfers, and dressing   PARTICIPATION LIMITATIONS: cleaning, driving, and shopping   PERSONAL FACTORS   HTN, MI, A-fib, CAD, L lumbar laminectomy, atherosclerosis, angina, HTN,  are also affecting patient's functional outcome.    REHAB POTENTIAL: Good   CLINICAL DECISION MAKING: Stable/uncomplicated   EVALUATION COMPLEXITY: Low     GOALS: Goals reviewed with patient? Yes   SHORT TERM GOALS: Target date: 07/06/2022  Patient will demonstrate independent use of initial home exercise program to maintain progress from in clinic treatments. Goal status: ongoing         2. Pt will be able to navigate curb step with st cane modified independently.                    A. Goal Status: ongoing        Long term PT goals (target dates for all long term goals are 8 weeks  07/30/2022 ) Patient will demonstrate/report pain at worst less than or equal to 2/10 with ADL's.  Goal status: New   Patient will demonstrate independent use of home exercise program to facilitate ability to maintain/progress functional gains from skilled physical therapy services. Goal status: New   Patient will demonstrate FOTO outcome > or = 51 % to indicate reduced disability due to condition. Goal status: New   Pt will be able to amb c no device community surfaces with normalize gait pattern.  Goal status: New       5.  Pt will be able to navigate 1 flight of stairs with single hand rail with step  over step pattern.   Goal status: New   6. Pt will improve Left knee flexion to >/= 120 degrees to perform functional mobility.      A. Goal Status: New     PLAN: PT FREQUENCY:  2-3 x/ week    PT DURATION: 8 weeks   PLANNED INTERVENTIONS: Therapeutic exercises, Therapeutic activity, Neuromuscular  re-education, Balance training, Gait training, Patient/Family education, Joint mobilization, Stair training, Electrical stimulation, Cryotherapy, Moist heat, Taping, Vasopneumatic device, and Manual therapy   PLAN FOR NEXT SESSION: Manual and therex for mobility gains, strengthening.  Early balance improvements.  Possible shift to 2x/week soon     Luna Fuse, PT 06/11/2022, 10:18 AM  [facility] Physical Therapy [address] [place], [place], [address] Phone: [phone]   Fax:  [phone]

## 2022-06-14 ENCOUNTER — Other Ambulatory Visit: Payer: Self-pay | Admitting: Orthopaedic Surgery

## 2022-06-14 ENCOUNTER — Telehealth: Payer: Self-pay | Admitting: Orthopaedic Surgery

## 2022-06-14 ENCOUNTER — Ambulatory Visit: Payer: Medicare Other | Admitting: Physical Therapy

## 2022-06-14 ENCOUNTER — Encounter: Payer: Self-pay | Admitting: Physical Therapy

## 2022-06-14 DIAGNOSIS — R262 Difficulty in walking, not elsewhere classified: Secondary | ICD-10-CM

## 2022-06-14 DIAGNOSIS — M25662 Stiffness of left knee, not elsewhere classified: Secondary | ICD-10-CM

## 2022-06-14 DIAGNOSIS — M6281 Muscle weakness (generalized): Secondary | ICD-10-CM | POA: Diagnosis not present

## 2022-06-14 DIAGNOSIS — R600 Localized edema: Secondary | ICD-10-CM | POA: Diagnosis not present

## 2022-06-14 DIAGNOSIS — M25562 Pain in left knee: Secondary | ICD-10-CM

## 2022-06-14 MED ORDER — OXYCODONE HCL 5 MG PO TABS: 5.0000 mg | ORAL_TABLET | Freq: Four times a day (QID) | ORAL | 0 refills | Status: DC | PRN

## 2022-06-14 NOTE — Telephone Encounter (Signed)
Pt need a refill of oxycodone. Please sen to pharmacy on file. Pt phone number is [phone].

## 2022-06-14 NOTE — Therapy (Signed)
[facility] Physical Therapy [address] [place], [place], [address] Phone: [phone]   Fax:  [phone]  Patient Details  Name: Erica Frank MRN: [id number] Date of Birth: 1941/11/07 Referring Provider:  No ref. provider found  Encounter Date: 06/14/2022    OUTPATIENT PHYSICAL THERAPY TREATMENT NOTE   Patient Name: Erica Frank MRN: [id number] DOB:[birth year], 81 y.o., female Today's Date: 06/14/2022  PCP: Prince Solian, MD REFERRING PROVIDER: Mcarthur Rossetti, MD  END OF SESSION:   PT End of Session - 06/14/22 1052     Visit Number 6    Number of Visits 24    Date for PT Re-Evaluation 07/30/22    Authorization Type UHC medicare    Progress Note Due on Visit 10    PT Start Time 1015    PT Stop Time 1100    PT Time Calculation (min) 45 min    Activity Tolerance Patient tolerated treatment well;No increased pain    Behavior During Therapy [facility] for tasks assessed/performed                Past Medical History:  Diagnosis Date   Anginal pain ([place]) 1989   Anxiety    Arthritis    Atherosclerosis    Atrial fibrillation ([place])    Coronary artery disease    Dysrhythmia    a-fib   Elevated liver enzymes    GERD (gastroesophageal reflux disease)    Heart attack ([place]) 2020   Heart disease    High cholesterol    Hypertension    NSTEMI (non-ST elevated myocardial infarction) ([place]) 07/09/2019   Past Surgical History:  Procedure Laterality Date   ABDOMINAL HYSTERECTOMY  1983   for endometriosis with Burch   ANGIOPLASTY     ANGIOPLASTY     BACK SURGERY     1990   CARDIAC SURGERY     Catheterization   CORONARY STENT INTERVENTION N/A 07/09/2019   Procedure: CORONARY STENT INTERVENTION;  Surgeon: Adrian Prows, MD;  Location: [place] CV LAB;  Service: Cardiovascular;  Laterality: N/A;   LEFT HEART CATH AND CORONARY ANGIOGRAPHY N/A 07/09/2019   Procedure: LEFT HEART CATH AND CORONARY ANGIOGRAPHY and possible intervention;   Surgeon: Adrian Prows, MD;  Location: [place] CV LAB;  Service: Cardiovascular;  Laterality: N/A;  4: or 4:30 PM today   LUMBAR LAMINECTOMY/DECOMPRESSION MICRODISCECTOMY Left 02/02/2022   Procedure: Left Lumbar two-three, Lumbar five-Sacral one Sublaminar decompression;  Surgeon: Kristeen Miss, MD;  Location: [place];  Service: Neurosurgery;  Laterality: Left;   NECK SURGERY     TOTAL KNEE ARTHROPLASTY Left 10/07/2020   Procedure: TOTAL KNEE ARTHROPLASTY;  Surgeon: Paralee Cancel, MD;  Location: [facility];  Service: Orthopedics;  Laterality: Left;  70 mins   TOTAL KNEE REVISION Left 05/13/2022   Procedure: SYNOVECTOMY AND LEFT KNEE POLY-LINER EXCHANGE;  Surgeon: Mcarthur Rossetti, MD;  Location: [place];  Service: Orthopedics;  Laterality: Left;   Patient Active Problem List   Diagnosis Date Noted   Status post revision of total replacement of left knee 05/13/2022   Degenerative lumbar spinal stenosis 02/02/2022   S/P left TKA 10/07/2020   Left knee OA 10/07/2020   Syncope 01/16/2020   Anemia 01/16/2020   Thrombocytopenia ([place]) 01/16/2020   Paroxysmal atrial fibrillation ([place]) 02/15/2019   Mixed hyperlipidemia 02/15/2019   Bradycardia 02/15/2019   Coronary artery disease involving native coronary artery of native heart with unstable angina pectoris ([place]) 12/20/2018   Dyslipidemia 12/20/2018   Essential hypertension 12/20/2018  REFERRING DIAG: Z32.992 (ICD-10-CM) - Status post revision of total knee, left  ONSET DATE: 05/13/2022  THERAPY DIAG:  Acute pain of left knee  Stiffness of left knee, not elsewhere classified  Muscle weakness (generalized)  Localized edema  Difficulty in walking, not elsewhere classified  Rationale for Evaluation and Treatment Rehabilitation  PERTINENT HISTORY: HTN, MI, A-fib, CAD, L lumbar laminectomy, atherosclerosis, angina, HTN  PRECAUTIONS: None  SUBJECTIVE: Pt stating her pain is consistently a 6/10. Pt did report her pain will drop some  after taking her tramadol. Pt reporting compliance in her HEP. Pt concerned with her calf swelling and pain in her lower leg. We discussed purchasing over the counter thigh high compression stockings.   PAIN:   NPRS scale: 6/10 today overall Pain location: left knee more in front/medial Pain description: achy & burning, tightness Aggravating factors: exercising bending knee Relieving factors: laying down, meds & ice  OBJECTIVE: (objective measures completed at initial evaluation unless otherwise dated)    PATIENT SURVEYS:  05/31/22: FOTO 36% (predicted 51%)                       SENSATION: 06/01/2022:  WFL   EDEMA:  06/01/2022:  Circumferential: Rt knee : 37.5 centimeters 06/01/2022:  Circumferential: Left knee: 39 centimeters   MUSCLE LENGTH: 06/01/2022:  Hamstrings: Right 86 deg; Left 75 deg  POSTURE: 06/01/2022:  rounded shoulders and forward head  PALPATION: 06/01/2022:  Tender on medial and lateral joint line with more pain reported on medial side  LOWER EXTREMITY ROM:  Active ROM Right Eval 06/01/22 Left Eval 06/01/22 06/07/22 06/09/2022 06/14/22   Hip flexion         Hip extension         Hip abduction         Hip adduction         Knee flexion 125 80 90 AAROM  Supine AROM heel slide: 97 Supine Active 96  Knee extension 0 -4 -4  -4   (Blank rows = not tested)   Passive ROM Left Eval 06/01/22 Left 06/14/22 supine  Hip flexion     Hip extension     Hip abduction     Hip adduction     Knee flexion 88 100  Knee extension -2 -2       LOWER EXTREMITY MMT:  MMT Right Eval 06/01/22 Left Eval 06/01/22  Hip flexion 5/5 4/5  Hip extension      Hip abduction 4/5 4/5  Hip adduction 4/5 4/5  Hip internal rotation      Hip external rotation      Knee flexion 5/5 2/5  Knee extension 5/5 2/5  Ankle dorsiflexion 5/5 5/5  Ankle plantarflexion 5/5 5/5  Ankle inversion      Ankle eversion       (Blank rows = not tested)       FUNCTIONAL TESTS:  06/01/2022:  5  times sit to stand: 15 seconds with UE support   GAIT: 06/01/2022:  Distance walked: 50 feet  Assistive device utilized: Environmental consultant - 2 wheeled Level of assistance: Modified independence Comments: antalgic gait pattern, decreased left knee flexion and hip flexion     TODAY'S TREATMENT: 06/14/22: Therapeutic Exercise: NuStep LEs only level 4 LE's only x 8 minutes Leg Press: 50# bilateral LE's 3 x 10, 37# Left LE only 2 x 10 holding 3 sec in extension Rocking side to side on fitter first 2 point board x 1 minute Rocking forward/back  on fitter first 2 point board x 1 minute Quad sets x 15 with overpressure on extension holding 5 sec SLR 2 x 10 Heel slides x 10 LAQ 3# weight 2 x 10 holding 3 sec AAROM knee flexion in tail gait position Manual Therapy: Knee flexion mobs with flexion and IR in sitting Modalities: vasopnuematic X 15 min to left low compression 3 snowflakes   [facility] Adult PT Treatment:                                                DATE: 06/11/22 Therapeutic Exercise: NuStep LEs only level 3 with concentration on knee extension STS from EOM x 15, x 5 with concentration on knee ROM and control Quad sets x 20 with knee ext -14 at end SLR x 15 Heel slides x 10 Max L knee flexion w/bridge 2x10 Heel slide with static hold 5x30 sec Seated EOM 2x30 sec hamstring stretch L Manual Therapy: Knee ext mob supine grade 2 to pt tolerance  Modalities: vasopnuematic X 15 min to left low compression 3 snowflakes   06/09/2022: Therex:  Nustep lvl 5 8 mins UE/LE  Supine AROM heel slide Lt x 10  Supine Lt quad set 5 sec hold x 5  Supine SLR Lt 2 x 5   Seated Lt leg LAQ c end range pause in extension and flexion x 15   Neuro Re-ed:  Church pew anterior/posterior ankle weight shift c SBA 2 mins  Retro step Lt leg posterior x 15 c SBA  Manual:  Seated Lt knee flexion/distraction/IR mobilization c movement for flexion mobility. Contract/relax for Lt quad into flexion in  sitting  modalaties: vasopnuematic X 10 min to left       PATIENT EDUCATION:  Education details: PT POC, HEP Person educated: Patient Education method: Explanation, Demonstration, Tactile cues, Verbal cues, and Handouts Education comprehension: verbalized understanding, returned demonstration, and needs further education     HOME EXERCISE PROGRAM: Access Code: [email or web address] URL: [email or web address] Date: 06/01/2022 Prepared by: Kearney Hard   Exercises - Supine Straight Leg Raises  - 3 x daily - 7 x weekly - 2 sets - 10 reps - Supine Heel Slide with Strap  - 3 x daily - 7 x weekly - 2 sets - 10 reps - Long Sitting Quad Set with Towel Roll Under Heel  - 3 x daily - 7 x weekly - 2 sets - 10 reps - 5 seconds hold - Seated Long Arc Quad  - 3 x daily - 7 x weekly - 2 sets - 10 reps - 3 seconds hold - Seated Knee Flexion AAROM  - 3 x daily - 7 x weekly - 10 reps - 10 seconds hold   ASSESSMENT:   CLINICAL IMPRESSION: Pt tolerating exercises well. Focusing on strengthening, ROM with beginning balance activities. Pt's PROM has improved to 100 degrees left knee flexion. Pt has currently met all her STG's. We discussed dropping pt to 2x/ week possibly next week pending pt's continuing progress. Continue with skilled PT to maximize pt's function.    OBJECTIVE IMPAIRMENTS Abnormal gait, decreased activity tolerance, decreased balance, decreased mobility, difficulty walking, decreased ROM, decreased strength, increased edema, impaired flexibility, and pain.    ACTIVITY LIMITATIONS lifting, bending, sitting, standing, sleeping, stairs, transfers, and dressing   PARTICIPATION LIMITATIONS: cleaning, driving, and shopping   PERSONAL FACTORS   HTN,  MI, A-fib, CAD, L lumbar laminectomy, atherosclerosis, angina, HTN,  are also affecting patient's functional outcome.    REHAB POTENTIAL: Good   CLINICAL DECISION MAKING: Stable/uncomplicated   EVALUATION COMPLEXITY: Low      GOALS: Goals reviewed with patient? Yes   SHORT TERM GOALS: Target date: 07/06/2022  Patient will demonstrate independent use of initial home exercise program to maintain progress from in clinic treatments. Goal status: MET 06/14/22         2. Pt will be able to navigate curb step with st cane modified independently.                    A. Goal Status: MET 06/14/22        Long term PT goals (target dates for all long term goals are 8 weeks  07/30/2022 ) Patient will demonstrate/report pain at worst less than or equal to 2/10 with ADL's.  Goal status: New   Patient will demonstrate independent use of home exercise program to facilitate ability to maintain/progress functional gains from skilled physical therapy services. Goal status: New   Patient will demonstrate FOTO outcome > or = 51 % to indicate reduced disability due to condition. Goal status: New   Pt will be able to amb c no device community surfaces with normalize gait pattern.  Goal status: New       5.  Pt will be able to navigate 1 flight of stairs with single hand rail with step  over step pattern.   Goal status: New   6. Pt will improve Left knee flexion to >/= 120 degrees to perform functional mobility.      A. Goal Status: New     PLAN: PT FREQUENCY:  2-3 x/ week    PT DURATION: 8 weeks   PLANNED INTERVENTIONS: Therapeutic exercises, Therapeutic activity, Neuromuscular re-education, Balance training, Gait training, Patient/Family education, Joint mobilization, Stair training, Electrical stimulation, Cryotherapy, Moist heat, Taping, Vasopneumatic device, and Manual therapy   PLAN FOR NEXT SESSION: Manual and therex for mobility gains, strengthening.  Balance.      Oretha Caprice, PT, MPT 06/14/2022, 10:53 AM  [facility] Physical Therapy [address] [place], [place], [address] Phone: [phone]   Fax:  [phone]

## 2022-06-16 ENCOUNTER — Ambulatory Visit: Payer: Medicare Other | Admitting: Physical Therapy

## 2022-06-16 ENCOUNTER — Encounter: Payer: Self-pay | Admitting: Physical Therapy

## 2022-06-16 DIAGNOSIS — M6281 Muscle weakness (generalized): Secondary | ICD-10-CM | POA: Diagnosis not present

## 2022-06-16 DIAGNOSIS — R262 Difficulty in walking, not elsewhere classified: Secondary | ICD-10-CM

## 2022-06-16 DIAGNOSIS — M25662 Stiffness of left knee, not elsewhere classified: Secondary | ICD-10-CM

## 2022-06-16 DIAGNOSIS — M25562 Pain in left knee: Secondary | ICD-10-CM

## 2022-06-16 DIAGNOSIS — R600 Localized edema: Secondary | ICD-10-CM

## 2022-06-16 NOTE — Therapy (Signed)
[facility] Physical Therapy [address] [place], [place], [address] Phone: [phone]   Fax:  [phone]  Patient Details  Name: Erica Frank MRN: [id number] Date of Birth: [birth year] Referring Provider:  No ref. provider found  Encounter Date: 06/16/2022    OUTPATIENT PHYSICAL THERAPY TREATMENT NOTE   Patient Name: Erica Frank MRN: [id number] DOB:[birth year], 81 y.o., female Today's Date: 06/16/2022  PCP: Prince Solian, MD REFERRING PROVIDER: Mcarthur Rossetti, MD  END OF SESSION:   PT End of Session - 06/16/22 1055     Visit Number 7    Number of Visits 24    Date for PT Re-Evaluation 07/30/22    Authorization Type UHC medicare    Progress Note Due on Visit 10    PT Start Time 0076    PT Stop Time 1104    PT Time Calculation (min) 49 min    Activity Tolerance Patient tolerated treatment well;No increased pain    Behavior During Therapy [facility] for tasks assessed/performed                 Past Medical History:  Diagnosis Date   Anginal pain ([place]) 1989   Anxiety    Arthritis    Atherosclerosis    Atrial fibrillation ([place])    Coronary artery disease    Dysrhythmia    a-fib   Elevated liver enzymes    GERD (gastroesophageal reflux disease)    Heart attack ([place]) 2020   Heart disease    High cholesterol    Hypertension    NSTEMI (non-ST elevated myocardial infarction) ([place]) 07/09/2019   Past Surgical History:  Procedure Laterality Date   ABDOMINAL HYSTERECTOMY  1983   for endometriosis with Burch   ANGIOPLASTY     ANGIOPLASTY     BACK SURGERY     1990   CARDIAC SURGERY     Catheterization   CORONARY STENT INTERVENTION N/A 07/09/2019   Procedure: CORONARY STENT INTERVENTION;  Surgeon: Adrian Prows, MD;  Location: [place] [facility];  Service: Cardiovascular;  Laterality: N/A;   LEFT HEART CATH AND CORONARY ANGIOGRAPHY N/A 07/09/2019   Procedure: LEFT HEART CATH AND CORONARY ANGIOGRAPHY and possible  intervention;  Surgeon: Adrian Prows, MD;  Location: [place] [facility];  Service: Cardiovascular;  Laterality: N/A;  4: or 4:30 PM today   LUMBAR LAMINECTOMY/DECOMPRESSION MICRODISCECTOMY Left 02/02/2022   Procedure: Left Lumbar two-three, Lumbar five-Sacral one Sublaminar decompression;  Surgeon: Kristeen Miss, MD;  Location: [place];  Service: Neurosurgery;  Laterality: Left;   NECK SURGERY     TOTAL KNEE ARTHROPLASTY Left 10/07/2020   Procedure: TOTAL KNEE ARTHROPLASTY;  Surgeon: Paralee Cancel, MD;  [facility];  Service: Orthopedics;  Laterality: Left;  70 mins   TOTAL KNEE REVISION Left 05/13/2022   Procedure: SYNOVECTOMY AND LEFT KNEE POLY-LINER EXCHANGE;  Surgeon: Mcarthur Rossetti, MD;  Location: [place];  Service: Orthopedics;  Laterality: Left;   Patient Active Problem List   Diagnosis Date Noted   Status post revision of total replacement of left knee 05/13/2022   Degenerative lumbar spinal stenosis 02/02/2022   S/P left TKA 10/07/2020   Left knee OA 10/07/2020   Syncope 01/16/2020   Anemia 01/16/2020   Thrombocytopenia ([place]) 01/16/2020   Paroxysmal atrial fibrillation ([place]) 02/15/2019   Mixed hyperlipidemia 02/15/2019   Bradycardia 02/15/2019   Coronary artery disease involving native coronary artery of native heart with unstable angina pectoris ([place]) 12/20/2018   Dyslipidemia 12/20/2018   Essential hypertension 12/20/2018  REFERRING DIAG: I26.415 (ICD-10-CM) - Status post revision of total knee, left  ONSET DATE: 05/13/2022  THERAPY DIAG:  Acute pain of left knee  Stiffness of left knee, not elsewhere classified  Muscle weakness (generalized)  Localized edema  Difficulty in walking, not elsewhere classified  Rationale for Evaluation and Treatment Rehabilitation  PERTINENT HISTORY: HTN, MI, A-fib, CAD, L lumbar laminectomy, atherosclerosis, angina, HTN  PRECAUTIONS: None  SUBJECTIVE: Pt stating her pain is consistently a 6/10. Pt did report her pain will  drop some after taking her tramadol. Pt reporting compliance in her HEP. Pt concerned with her calf swelling and pain in her lower leg. We discussed purchasing over the counter thigh high compression stockings.   PAIN:   NPRS scale: 6/10 today overall Pain location: left knee more in front/medial Pain description: achy & burning, tightness Aggravating factors: exercising bending knee Relieving factors: laying down, meds & ice  OBJECTIVE: (objective measures completed at initial evaluation unless otherwise dated)    PATIENT SURVEYS:  05/31/22: FOTO 36% (predicted 51%)                       SENSATION: 06/01/2022:  WFL   EDEMA:  06/01/2022:  Circumferential: Rt knee : 37.5 centimeters 06/01/2022:  Circumferential: Left knee: 39 centimeters   MUSCLE LENGTH: 06/01/2022:  Hamstrings: Right 86 deg; Left 75 deg  POSTURE: 06/01/2022:  rounded shoulders and forward head  PALPATION: 06/01/2022:  Tender on medial and lateral joint line with more pain reported on medial side  LOWER EXTREMITY ROM:  Active ROM Right Eval 06/01/22 Left Eval 06/01/22 06/07/22 06/09/2022 06/14/22   Hip flexion         Hip extension         Hip abduction         Hip adduction         Knee flexion 125 80 90 AAROM  Supine AROM heel slide: 97 Supine Active 96  Knee extension 0 -4 -4  -4   (Blank rows = not tested)   Passive ROM Left Eval 06/01/22 Left 06/14/22 supine Left 06/16/22 supine  Hip flexion      Hip extension      Hip abduction      Hip adduction      Knee flexion 88 100 110  Knee extension -2 -2 -2       LOWER EXTREMITY MMT:  MMT Right Eval 06/01/22 Left Eval 06/01/22  Hip flexion 5/5 4/5  Hip extension      Hip abduction 4/5 4/5  Hip adduction 4/5 4/5  Hip internal rotation      Hip external rotation      Knee flexion 5/5 2/5  Knee extension 5/5 2/5  Ankle dorsiflexion 5/5 5/5  Ankle plantarflexion 5/5 5/5  Ankle inversion      Ankle eversion       (Blank rows = not tested)        FUNCTIONAL TESTS:  06/01/2022:  5 times sit to stand: 15 seconds with UE support   GAIT: 06/01/2022:  Distance walked: 50 feet  Assistive device utilized: Environmental consultant - 2 wheeled Level of assistance: Modified independence Comments: antalgic gait pattern, decreased left knee flexion and hip flexion     TODAY'S TREATMENT: 06/16/22: Therapeutic Exercise: NuStep LEs only level 4 LE's only x 8 minutes Leg Press: 50# bilateral LE's 3 x 10, 37# Left LE only 2 x 10 holding 3 sec in extension Standing on Airex c alternating cone  taps x 20 with CGA c gait belt Stepping toward 3 cone Step ups 6 inch step x 15 leading c left Lateral step ups x 10 each side LAQ 3# weight 2 x 10 holding 3 sec Manual Therapy: Knee flexion mobs with flexion and IR in sitting Modalities: vasopnuematic X 10 min to left medium compression 34 degrees   06/14/22: Therapeutic Exercise: NuStep LEs only level 4 LE's only x 8 minutes Leg Press: 50# bilateral LE's 3 x 10, 37# Left LE only 2 x 10 holding 3 sec in extension Rocking side to side on fitter first 2 point board x 1 minute Rocking forward/back on fitter first 2 point board x 1 minute Quad sets x 15 with overpressure on extension holding 5 sec SLR 2 x 10 Heel slides x 10 LAQ 3# weight 2 x 10 holding 3 sec AAROM knee flexion in tail gait position Manual Therapy: Knee flexion mobs with flexion and IR in sitting Modalities: vasopnuematic X 15 min to left low compression 3 snowflakes   [facility] Adult PT Treatment:                                                DATE: 06/11/22 Therapeutic Exercise: NuStep LEs only level 3 with concentration on knee extension STS from EOM x 15, x 5 with concentration on knee ROM and control Quad sets x 20 with knee ext -14 at end SLR x 15 Heel slides x 10 Max L knee flexion w/bridge 2x10 Heel slide with static hold 5x30 sec Seated EOM 2x30 sec hamstring stretch L Manual Therapy: Knee ext mob supine grade 2 to pt  tolerance  Modalities: vasopnuematic X 15 min to left low compression 3 snowflakes         PATIENT EDUCATION:  Education details: PT POC, HEP Person educated: Patient Education method: Explanation, Demonstration, Tactile cues, Verbal cues, and Handouts Education comprehension: verbalized understanding, returned demonstration, and needs further education     HOME EXERCISE PROGRAM: Access Code: [email or web address] URL: [email or web address] Date: 06/01/2022 Prepared by: Jennifer Martin   Exercises - Supine Straight Leg Raises  - 3 x daily - 7 x weekly - 2 sets - 10 reps - Supine Heel Slide with Strap  - 3 x daily - 7 x weekly - 2 sets - 10 reps - Long Sitting Quad Set with Towel Roll Under Heel  - 3 x daily - 7 x weekly - 2 sets - 10 reps - 5 seconds hold - Seated Long Arc Quad  - 3 x daily - 7 x weekly - 2 sets - 10 reps - 3 seconds hold - Seated Knee Flexion AAROM  - 3 x daily - 7 x weekly - 10 reps - 10 seconds hold   ASSESSMENT:   CLINICAL IMPRESSION: Pt reporting more stiffness today after using her recumbent bike at home yesterday. Pt stating pain today is 7/10. Pt able to tolerate exercises for strengthening and balance. Passive ROM in left knee flexion increased to 110 degrees. Continue with skilled PT to maximize pt's function.    OBJECTIVE IMPAIRMENTS Abnormal gait, decreased activity tolerance, decreased balance, decreased mobility, difficulty walking, decreased ROM, decreased strength, increased edema, impaired flexibility, and pain.    ACTIVITY LIMITATIONS lifting, bending, sitting, standing, sleeping, stairs, transfers, and dressing   PARTICIPATION LIMITATIONS: cleaning, driving, and shopping   PERSONAL FACTORS     HTN, MI, A-fib, CAD, L lumbar laminectomy, atherosclerosis, angina, HTN,  are also affecting patient's functional outcome.    REHAB POTENTIAL: Good   CLINICAL DECISION MAKING: Stable/uncomplicated   EVALUATION COMPLEXITY: Low     GOALS: Goals  reviewed with patient? Yes   SHORT TERM GOALS: Target date: 07/06/2022  Patient will demonstrate independent use of initial home exercise program to maintain progress from in clinic treatments. Goal status: MET 06/14/22         2. Pt will be able to navigate curb step with st cane modified independently.                    A. Goal Status: MET 06/14/22        Long term PT goals (target dates for all long term goals are 8 weeks  07/30/2022 ) Patient will demonstrate/report pain at worst less than or equal to 2/10 with ADL's.  Goal status: New   Patient will demonstrate independent use of home exercise program to facilitate ability to maintain/progress functional gains from skilled physical therapy services. Goal status: New   Patient will demonstrate FOTO outcome > or = 51 % to indicate reduced disability due to condition. Goal status: New   Pt will be able to amb c no device community surfaces with normalize gait pattern.  Goal status: New       5.  Pt will be able to navigate 1 flight of stairs with single hand rail with step  over step pattern.   Goal status: New   6. Pt will improve Left knee flexion to >/= 120 degrees to perform functional mobility.      A. Goal Status: New     PLAN: PT FREQUENCY:  2-3 x/ week    PT DURATION: 8 weeks   PLANNED INTERVENTIONS: Therapeutic exercises, Therapeutic activity, Neuromuscular re-education, Balance training, Gait training, Patient/Family education, Joint mobilization, Stair training, Electrical stimulation, Cryotherapy, Moist heat, Taping, Vasopneumatic device, and Manual therapy   PLAN FOR NEXT SESSION: Manual and therex for mobility gains, strengthening.  Balance.      Oretha Caprice, PT, MPT 06/16/2022, 10:56 AM  [facility] Physical Therapy [address] [place], [place], [address] Phone: [phone]   Fax:  [phone]

## 2022-06-17 ENCOUNTER — Encounter: Payer: Self-pay | Admitting: Rehabilitative and Restorative Service Providers"

## 2022-06-17 ENCOUNTER — Ambulatory Visit: Payer: Medicare Other | Admitting: Rehabilitative and Restorative Service Providers"

## 2022-06-17 DIAGNOSIS — M25562 Pain in left knee: Secondary | ICD-10-CM | POA: Diagnosis not present

## 2022-06-17 DIAGNOSIS — M6281 Muscle weakness (generalized): Secondary | ICD-10-CM | POA: Diagnosis not present

## 2022-06-17 DIAGNOSIS — M25662 Stiffness of left knee, not elsewhere classified: Secondary | ICD-10-CM | POA: Diagnosis not present

## 2022-06-17 DIAGNOSIS — R262 Difficulty in walking, not elsewhere classified: Secondary | ICD-10-CM

## 2022-06-17 DIAGNOSIS — R600 Localized edema: Secondary | ICD-10-CM

## 2022-06-17 NOTE — Therapy (Signed)
[facility] Physical Therapy [address] [place], [place], [address] Phone: [phone]   Fax:  [phone]  Patient Details  Name: Erica Frank MRN: [id number] Date of Birth: [birth year] Referring Provider:  No ref. provider found  Encounter Date: 06/17/2022    OUTPATIENT PHYSICAL THERAPY TREATMENT NOTE   Patient Name: Erica Frank MRN: [id number] DOB:1941/10/27, 81 y.o., female Today's Date: 06/17/2022  PCP: Prince Solian, MD REFERRING PROVIDER: Mcarthur Rossetti, MD  END OF SESSION:   PT End of Session - 06/17/22 0944     Visit Number 8    Number of Visits 24    Date for PT Re-Evaluation 07/30/22    Authorization Type UHC medicare    Progress Note Due on Visit 10    PT Start Time 0929    PT Stop Time 1019    PT Time Calculation (min) 50 min    Activity Tolerance Patient tolerated treatment well    Behavior During Therapy [facility] for tasks assessed/performed                  Past Medical History:  Diagnosis Date   Anginal pain ([place]) 1989   Anxiety    Arthritis    Atherosclerosis    Atrial fibrillation ([place])    Coronary artery disease    Dysrhythmia    a-fib   Elevated liver enzymes    GERD (gastroesophageal reflux disease)    Heart attack ([place]) 2020   Heart disease    High cholesterol    Hypertension    NSTEMI (non-ST elevated myocardial infarction) ([place]) 07/09/2019   Past Surgical History:  Procedure Laterality Date   ABDOMINAL HYSTERECTOMY  1983   for endometriosis with Burch   [place]     Catheterization   CORONARY STENT INTERVENTION N/A 07/09/2019   Procedure: CORONARY STENT INTERVENTION;  Surgeon: Adrian Prows, MD;  Location: [place] [facility];  Service: Cardiovascular;  Laterality: N/A;   LEFT HEART CATH AND CORONARY ANGIOGRAPHY N/A 07/09/2019   Procedure: LEFT HEART CATH AND CORONARY ANGIOGRAPHY and possible intervention;  Surgeon:  Adrian Prows, MD;  Location: [place] [facility];  Service: Cardiovascular;  Laterality: N/A;  4: or 4:30 PM today   LUMBAR LAMINECTOMY/DECOMPRESSION MICRODISCECTOMY Left 02/02/2022   Procedure: Left Lumbar two-three, Lumbar five-Sacral one Sublaminar decompression;  Surgeon: Kristeen Miss, MD;  Location: [place];  Service: Neurosurgery;  Laterality: Left;   NECK SURGERY     TOTAL KNEE ARTHROPLASTY Left 10/07/2020   Procedure: TOTAL KNEE ARTHROPLASTY;  Surgeon: Paralee Cancel, MD;  [facility];  Service: Orthopedics;  Laterality: Left;  70 mins   TOTAL KNEE REVISION Left 05/13/2022   Procedure: SYNOVECTOMY AND LEFT KNEE POLY-LINER EXCHANGE;  Surgeon: Mcarthur Rossetti, MD;  Location: [place];  Service: Orthopedics;  Laterality: Left;   Patient Active Problem List   Diagnosis Date Noted   Status post revision of total replacement of left knee 05/13/2022   Degenerative lumbar spinal stenosis 02/02/2022   S/P left TKA 10/07/2020   Left knee OA 10/07/2020   Syncope 01/16/2020   Anemia 01/16/2020   Thrombocytopenia ([place]) 01/16/2020   Paroxysmal atrial fibrillation ([place]) 02/15/2019   Mixed hyperlipidemia 02/15/2019   Bradycardia 02/15/2019   Coronary artery disease involving native coronary artery of native heart with unstable angina pectoris ([place]) 12/20/2018   Dyslipidemia 12/20/2018   Essential hypertension 12/20/2018  REFERRING DIAG: W09.811 (ICD-10-CM) - Status post revision of total knee, left  ONSET DATE: 05/13/2022  THERAPY DIAG:  Acute pain of left knee  Stiffness of left knee, not elsewhere classified  Muscle weakness (generalized)  Localized edema  Difficulty in walking, not elsewhere classified  Rationale for Evaluation and Treatment Rehabilitation  PERTINENT HISTORY: HTN, MI, A-fib, CAD, L lumbar laminectomy, atherosclerosis, angina, HTN  PRECAUTIONS: None  SUBJECTIVE: Pt indicated 4/10 pain at most.  Reported more tightness then pain.  Has bike at home and  mentioned used it some last night going around.   PAIN:   NPRS scale: 4/10 today at worst Pain location: left knee more in front/medial Pain description: achy & burning, tightness Aggravating factors: exercising bending knee Relieving factors: laying down, meds & ice  OBJECTIVE: (objective measures completed at initial evaluation unless otherwise dated)    PATIENT SURVEYS:  05/31/22: FOTO 36% (predicted 51%)                       SENSATION: 06/01/2022:  WFL   EDEMA:  06/01/2022:  Circumferential: Rt knee : 37.5 centimeters 06/01/2022:  Circumferential: Left knee: 39 centimeters   MUSCLE LENGTH: 06/01/2022:  Hamstrings: Right 86 deg; Left 75 deg  POSTURE: 06/01/2022:  rounded shoulders and forward head  PALPATION: 06/01/2022:  Tender on medial and lateral joint line with more pain reported on medial side  LOWER EXTREMITY ROM:  Active ROM Right Eval 06/01/22 Left Eval 06/01/22 06/07/22 06/09/2022 06/14/22   Hip flexion         Hip extension         Hip abduction         Hip adduction         Knee flexion 125 80 90 AAROM  Supine AROM heel slide: 97 Supine Active 96  Knee extension 0 -4 -4  -4   (Blank rows = not tested)   Passive ROM Left Eval 06/01/22 Left 06/14/22 supine Left 06/16/22 supine  Hip flexion      Hip extension      Hip abduction      Hip adduction      Knee flexion 88 100 110  Knee extension -2 -2 -2       LOWER EXTREMITY MMT:  MMT Right Eval 06/01/22 Left Eval 06/01/22  Hip flexion 5/5 4/5  Hip extension      Hip abduction 4/5 4/5  Hip adduction 4/5 4/5  Hip internal rotation      Hip external rotation      Knee flexion 5/5 2/5  Knee extension 5/5 2/5  Ankle dorsiflexion 5/5 5/5  Ankle plantarflexion 5/5 5/5  Ankle inversion      Ankle eversion       (Blank rows = not tested)       FUNCTIONAL TESTS:  06/01/2022:  5 times sit to stand: 15 seconds with UE support   GAIT: 06/01/2022:  Distance walked: 50 feet  Assistive device  utilized: Environmental consultant - 2 wheeled Level of assistance: Modified independence Comments: antalgic gait pattern, decreased left knee flexion and hip flexion     TODAY'S TREATMENT: 06/17/22: Therapeutic Exercise: UBE LE only Lvl 1.0 full revolutions , seat 12 Gastroc runner stretch on incline board Lt leg posterior 30 sec x 3 Seated LAQ pause in end range flexion and extension Lt 4 lbs 2 x 15  Seated Lt SLR x 15 (cues for home use)   TherActivity (to improve stairs, transfers, ambulation)  Leg press : double leg 56 lbs x 15, Lt leg 2 x 15 37 lbs   Lateral step down 4 inch step eccentric lowering x 10 c single hand rail use  Step on over and down WB on Lt leg 4 inch x 10 c single hand rail use  Neuro Re-ed  SLS vector reaching fwd/lateral/side c contralateral leg occasional hand assist on table x 6 bilateral  Standing alternating heel/toe raise 20x c SBA  Modalities: vasopnuematic X 10 min to left medium compression 34 degrees  06/16/22: Therapeutic Exercise: NuStep LEs only level 4 LE's only x 8 minutes Leg Press: 50# bilateral LE's 3 x 10, 37# Left LE only 2 x 10 holding 3 sec in extension Standing on Airex c alternating cone taps x 20 with CGA c gait belt Stepping toward 3 cone Step ups 6 inch step x 15 leading c left Lateral step ups x 10 each side LAQ 3# weight 2 x 10 holding 3 sec Manual Therapy: Knee flexion mobs with flexion and IR in sitting Modalities: vasopnuematic X 10 min to left medium compression 34 degrees   06/14/22: Therapeutic Exercise: NuStep LEs only level 4 LE's only x 8 minutes Leg Press: 50# bilateral LE's 3 x 10, 37# Left LE only 2 x 10 holding 3 sec in extension Rocking side to side on fitter first 2 point board x 1 minute Rocking forward/back on fitter first 2 point board x 1 minute Quad sets x 15 with overpressure on extension holding 5 sec SLR 2 x 10 Heel slides x 10 LAQ 3# weight 2 x 10 holding 3 sec AAROM knee flexion in tail gait position Manual  Therapy: Knee flexion mobs with flexion and IR in sitting Modalities: vasopnuematic X 15 min to left low compression 3 snowflakes   [facility] Adult PT Treatment:                                                DATE: 06/11/22 Therapeutic Exercise: NuStep LEs only level 3 with concentration on knee extension STS from EOM x 15, x 5 with concentration on knee ROM and control Quad sets x 20 with knee ext -14 at end SLR x 15 Heel slides x 10 Max L knee flexion w/bridge 2x10 Heel slide with static hold 5x30 sec Seated EOM 2x30 sec hamstring stretch L Manual Therapy: Knee ext mob supine grade 2 to pt tolerance  Modalities: vasopnuematic X 15 min to left low compression 3 snowflakes         PATIENT EDUCATION:  Education details: PT POC, HEP Person educated: Patient Education method: Consulting civil engineer, Demonstration, Tactile cues, Verbal cues, and Handouts Education comprehension: verbalized understanding, returned demonstration, and needs further education     HOME EXERCISE PROGRAM: Access Code: [email or web address] URL: [email or web address] Date: 06/01/2022 Prepared by: Kearney Hard   Exercises - Supine Straight Leg Raises  - 3 x daily - 7 x weekly - 2 sets - 10 reps - Supine Heel Slide with Strap  - 3 x daily - 7 x weekly - 2 sets - 10 reps - Long Sitting Quad Set with Towel Roll Under Heel  - 3 x daily - 7 x weekly - 2 sets - 10 reps - 5 seconds hold - Seated Long Arc Quad  - 3 x daily - 7 x weekly - 2 sets - 10 reps -  3 seconds hold - Seated Knee Flexion AAROM  - 3 x daily - 7 x weekly - 10 reps - 10 seconds hold   ASSESSMENT:   CLINICAL IMPRESSION: Periods of independent ambulation in clinic c supervision without SPC use.  Eccentric control on curb height step fair to good today, anticipate progression to step height soon for continued improvements.  Continued skilled PT services warranted at this time.    OBJECTIVE IMPAIRMENTS Abnormal gait, decreased activity tolerance,  decreased balance, decreased mobility, difficulty walking, decreased ROM, decreased strength, increased edema, impaired flexibility, and pain.    ACTIVITY LIMITATIONS lifting, bending, sitting, standing, sleeping, stairs, transfers, and dressing   PARTICIPATION LIMITATIONS: cleaning, driving, and shopping   PERSONAL FACTORS   HTN, MI, A-fib, CAD, L lumbar laminectomy, atherosclerosis, angina, HTN,  are also affecting patient's functional outcome.    REHAB POTENTIAL: Good   CLINICAL DECISION MAKING: Stable/uncomplicated   EVALUATION COMPLEXITY: Low     GOALS: Goals reviewed with patient? Yes   SHORT TERM GOALS: Target date: 07/06/2022  Patient will demonstrate independent use of initial home exercise program to maintain progress from in clinic treatments. Goal status: MET 06/14/22         2. Pt will be able to navigate curb step with st cane modified independently.                    A. Goal Status: MET 06/14/22        Long term PT goals (target dates for all long term goals are 8 weeks  07/30/2022 ) Patient will demonstrate/report pain at worst less than or equal to 2/10 with ADL's.  Goal status: New   Patient will demonstrate independent use of home exercise program to facilitate ability to maintain/progress functional gains from skilled physical therapy services. Goal status: New   Patient will demonstrate FOTO outcome > or = 51 % to indicate reduced disability due to condition. Goal status: New   Pt will be able to amb c no device community surfaces with normalize gait pattern.  Goal status: New       5.  Pt will be able to navigate 1 flight of stairs with single hand rail with step  over step pattern.   Goal status: New   6. Pt will improve Left knee flexion to >/= 120 degrees to perform functional mobility.      A. Goal Status: New     PLAN: PT FREQUENCY:  2-3 x/ week    PT DURATION: 8 weeks   PLANNED INTERVENTIONS: Therapeutic exercises, Therapeutic activity,  Neuromuscular re-education, Balance training, Gait training, Patient/Family education, Joint mobilization, Stair training, Electrical stimulation, Cryotherapy, Moist heat, Taping, Vasopneumatic device, and Manual therapy   PLAN FOR NEXT SESSION: LTG/progress note in next visit or two c FOTO.     Scot Jun, PT, DPT, OCS, ATC 06/17/22  10:11 AM    [facility] Physical Therapy [address] [place], [place], [address] Phone: [phone]   Fax:  [phone]

## 2022-06-18 ENCOUNTER — Encounter: Payer: Medicare Other | Admitting: Rehabilitative and Restorative Service Providers"

## 2022-06-21 ENCOUNTER — Encounter: Payer: Self-pay | Admitting: Physical Therapy

## 2022-06-21 ENCOUNTER — Ambulatory Visit: Payer: Medicare Other | Admitting: Physical Therapy

## 2022-06-21 DIAGNOSIS — M25562 Pain in left knee: Secondary | ICD-10-CM

## 2022-06-21 DIAGNOSIS — M25662 Stiffness of left knee, not elsewhere classified: Secondary | ICD-10-CM | POA: Diagnosis not present

## 2022-06-21 DIAGNOSIS — R600 Localized edema: Secondary | ICD-10-CM | POA: Diagnosis not present

## 2022-06-21 DIAGNOSIS — M6281 Muscle weakness (generalized): Secondary | ICD-10-CM | POA: Diagnosis not present

## 2022-06-21 DIAGNOSIS — R262 Difficulty in walking, not elsewhere classified: Secondary | ICD-10-CM

## 2022-06-21 NOTE — Therapy (Signed)
[facility] [address] [place], [place], [address] Phone: [phone]   Fax:  [phone]  Patient Details  Name: Erica Frank MRN: [id number] Date of Birth: 04-Mar-1941 Referring Provider:  No ref. provider found  Encounter Date: 06/21/2022    OUTPATIENT PHYSICAL THERAPY TREATMENT NOTE PROGRESS NOTE   Patient Name: Erica Frank MRN: [id number] DOB:09-16-1941, 81 y.o., female Today's Date: 06/21/2022  PCP: Prince Solian, MD REFERRING PROVIDER: Mcarthur Rossetti, MD Progress Note Reporting Period 06/01/22 to 06/21/2022 (9 visits)   See note below for Objective Data and Assessment of Progress/Goals.      END OF SESSION:   PT End of Session - 06/21/22 1057     Visit Number 9    Date for PT Re-Evaluation 07/30/22    Authorization Type [employer] medicare    Progress Note Due on Visit 64    PT Start Time 1020    PT Stop Time 1105    PT Time Calculation (min) 45 min    Activity Tolerance Patient tolerated treatment well    Behavior During Therapy WFL for tasks assessed/performed                   Past Medical History:  Diagnosis Date   Anginal pain ([place]) 1989   Anxiety    Arthritis    Atherosclerosis    Atrial fibrillation ([place])    Coronary artery disease    Dysrhythmia    a-fib   Elevated liver enzymes    GERD (gastroesophageal reflux disease)    Heart attack ([place]) 2020   Heart disease    High cholesterol    Hypertension    NSTEMI (non-ST elevated myocardial infarction) ([place]) 07/09/2019   Past Surgical History:  Procedure Laterality Date   ABDOMINAL HYSTERECTOMY  1983   for endometriosis with [place]   ANGIOPLASTY     ANGIOPLASTY     BACK SURGERY     1990   CARDIAC SURGERY     Catheterization   CORONARY STENT INTERVENTION N/A 07/09/2019   Procedure: CORONARY STENT INTERVENTION;  Surgeon: Adrian Prows, MD;  Location: [facility];  Service: Cardiovascular;  Laterality: N/A;   LEFT HEART  CATH AND CORONARY ANGIOGRAPHY N/A 07/09/2019   Procedure: LEFT HEART CATH AND CORONARY ANGIOGRAPHY and possible intervention;  Surgeon: Adrian Prows, MD;  Location: [facility];  Service: Cardiovascular;  Laterality: N/A;  4: or 4:30 PM today   LUMBAR LAMINECTOMY/DECOMPRESSION MICRODISCECTOMY Left 02/02/2022   Procedure: Left Lumbar two-three, Lumbar five-Sacral one Sublaminar decompression;  Surgeon: Kristeen Miss, MD;  Location: [facility];  Service: Neurosurgery;  Laterality: Left;   NECK SURGERY     TOTAL KNEE ARTHROPLASTY Left 10/07/2020   Procedure: TOTAL KNEE ARTHROPLASTY;  Surgeon: Paralee Cancel, MD;  Location: [facility];  Service: Orthopedics;  Laterality: Left;  70 mins   TOTAL KNEE REVISION Left 05/13/2022   Procedure: SYNOVECTOMY AND LEFT KNEE POLY-LINER EXCHANGE;  Surgeon: Mcarthur Rossetti, MD;  Location: [facility];  Service: Orthopedics;  Laterality: Left;   Patient Active Problem List   Diagnosis Date Noted   Status post revision of total replacement of left knee 05/13/2022   Degenerative lumbar spinal stenosis 02/02/2022   S/P left TKA 10/07/2020   Left knee OA 10/07/2020   Syncope 01/16/2020   Anemia 01/16/2020   Thrombocytopenia ([place]) 01/16/2020   Paroxysmal atrial fibrillation ([place]) 02/15/2019   Mixed hyperlipidemia 02/15/2019   Bradycardia 02/15/2019   Coronary artery disease involving native coronary  artery of native heart with unstable angina pectoris ([place]) 12/20/2018   Dyslipidemia 12/20/2018   Essential hypertension 12/20/2018    REFERRING DIAG: P82.423 (ICD-10-CM) - Status post revision of total knee, left  ONSET DATE: 05/13/2022  THERAPY DIAG:  Acute pain of left knee  Stiffness of left knee, not elsewhere classified  Muscle weakness (generalized)  Localized edema  Difficulty in walking, not elsewhere classified  Rationale for Evaluation and Treatment Rehabilitation  PERTINENT HISTORY: HTN, MI, A-fib, CAD, L lumbar laminectomy, atherosclerosis,  angina, HTN  PRECAUTIONS: None  SUBJECTIVE: Pt indicated 5/10 pain today in her left knee. Pt stating difficulty sleeping and having to take her prescription pain meds at 4:30am to provide some relief to return to sleep. Pt also reporting soreness in her Rt shoulder. Pt reporting history of rotator cuff tear.   PAIN:   NPRS scale: 5/10 today at worst Pain location: left knee more in front/medial Pain description: achy & burning, tightness Aggravating factors: exercising bending knee Relieving factors: laying down, meds & ice  OBJECTIVE: (objective measures completed at initial evaluation unless otherwise dated)    PATIENT SURVEYS:  05/31/22: FOTO 36% (predicted 51%)     06/21/22: FOTO 59%                    SENSATION: 06/01/2022:  WFL   EDEMA:  06/01/2022:  Circumferential: Rt knee : 37.5 centimeters 06/01/2022:  Circumferential: Left knee: 39 centimeters   MUSCLE LENGTH: 06/01/2022:  Hamstrings: Right 86 deg; Left 75 deg  POSTURE: 06/01/2022:  rounded shoulders and forward head  PALPATION: 06/01/2022:  Tender on medial and lateral joint line with more pain reported on medial side  LOWER EXTREMITY ROM:  Active ROM Right Eval 06/01/22 Left Eval 06/01/22 06/07/22 06/09/2022 06/14/22  06/21/22  Hip flexion          Hip extension          Hip abduction          Hip adduction          Knee flexion 125 80 90 AAROM  Supine AROM heel slide: 97 Supine Active 96 Supine Active 106  Knee extension 0 -4 -4  -4 -4   (Blank rows = not tested)   Passive ROM Left Eval 06/01/22 Left 06/14/22 supine Left 06/16/22 supine Left 06/21/22 supine  Hip flexion       Hip extension       Hip abduction       Hip adduction       Knee flexion 88 100 110 110  Knee extension -2 -2 -2 -2       LOWER EXTREMITY MMT:  MMT Right Eval 06/01/22 Left Eval 06/01/22  Hip flexion 5/5 4/5  Hip extension      Hip abduction 4/5 4/5  Hip adduction 4/5 4/5  Hip internal rotation      Hip external  rotation      Knee flexion 5/5 2/5  Knee extension 5/5 2/5  Ankle dorsiflexion 5/5 5/5  Ankle plantarflexion 5/5 5/5  Ankle inversion      Ankle eversion       (Blank rows = not tested)       FUNCTIONAL TESTS:  06/01/2022:  5 times sit to stand: 15 seconds with UE support   GAIT: 06/01/2022:  Distance walked: 50 feet  Assistive device utilized: Environmental consultant - 2 wheeled Level of assistance: Modified independence Comments: antalgic gait pattern, decreased left knee flexion and hip flexion  TODAY'S TREATMENT: 06/21/22: Therapeutic Exercise: Recumbent bike seat 3 x 6 minutes, partial to full revolutions Slant board x 3 holding 20 seconds Seated LAQ c 4 # 2 x 10 Seated Lt SLR x 15   TherActivity (to improve stairs, transfers, ambulation)  Leg press : double leg 62# x 2 x 10, Lt leg 2 x 10 37#   Lateral step down 4 inch step eccentric lowering x 10 c single hand rail use  Side stepping x 20 feet both directions  Sit to stand x 15 from with no UE support  Neuro Re-ed  SLS: Left LE  level ground with finger tap support on TM bar    Modalities: vasopnuematic X 10 min to left medium compression 34 degrees   06/17/22: Therapeutic Exercise: UBE LE only Lvl 1.0 full revolutions , seat 12 Gastroc runner stretch on incline board Lt leg posterior 30 sec x 3 Seated LAQ pause in end range flexion and extension Lt 4 lbs 2 x 15  Seated Lt SLR x 15 (cues for home use)   TherActivity (to improve stairs, transfers, ambulation)  Leg press : double leg 56 lbs x 15, Lt leg 2 x 15 37 lbs   Lateral step down 4 inch step eccentric lowering x 10 c single hand rail use  Step on over and down WB on Lt leg 4 inch x 10 c single hand rail use  Neuro Re-ed  SLS vector reaching fwd/lateral/side c contralateral leg occasional hand assist on table x 6 bilateral  Standing alternating heel/toe raise 20x c SBA  Modalities: vasopnuematic X 10 min to left medium compression 34  degrees  06/16/22: Therapeutic Exercise: NuStep LEs only level 4 LE's only x 8 minutes Leg Press: 50# bilateral LE's 3 x 10, 37# Left LE only 2 x 10 holding 3 sec in extension Standing on Airex c alternating cone taps x 20 with CGA c gait belt Stepping toward 3 cone Step ups 6 inch step x 15 leading c left Lateral step ups x 10 each side LAQ 3# weight 2 x 10 holding 3 sec Manual Therapy: Knee flexion mobs with flexion and IR in sitting Modalities: vasopnuematic X 10 min to left medium compression 34 degrees   06/14/22: Therapeutic Exercise: NuStep LEs only level 4 LE's only x 8 minutes Leg Press: 50# bilateral LE's 3 x 10, 37# Left LE only 2 x 10 holding 3 sec in extension Rocking side to side on fitter first 2 point board x 1 minute Rocking forward/back on fitter first 2 point board x 1 minute Quad sets x 15 with overpressure on extension holding 5 sec SLR 2 x 10 Heel slides x 10 LAQ 3# weight 2 x 10 holding 3 sec AAROM knee flexion in tail gait position Manual Therapy: Knee flexion mobs with flexion and IR in sitting Modalities: vasopnuematic X 15 min to left low compression 3 snowflakes            PATIENT EDUCATION:  Education details: PT POC, HEP Person educated: Patient Education method: Consulting civil engineer, Demonstration, Tactile cues, Verbal cues, and Handouts Education comprehension: verbalized understanding, returned demonstration, and needs further education     HOME EXERCISE PROGRAM: Access Code: [email or web address] URL: [email or web address] Date: 06/01/2022 Prepared by: Kearney Hard   Exercises - Supine Straight Leg Raises  - 3 x daily - 7 x weekly - 2 sets - 10 reps - Supine Heel Slide with Strap  - 3 x daily - 7 x weekly - 2 sets -  10 reps - Long Sitting Quad Set with Towel Roll Under Heel  - 3 x daily - 7 x weekly - 2 sets - 10 reps - 5 seconds hold - Seated Long Arc Quad  - 3 x daily - 7 x weekly - 2 sets - 10 reps - 3 seconds hold - Seated Knee  Flexion AAROM  - 3 x daily - 7 x weekly - 10 reps - 10 seconds hold   ASSESSMENT:   CLINICAL IMPRESSION: Pt amb today with no assistive device. Pt with step through gait pattern with mild antalgic gait. Pt tolerating all exercises well. Pt encouraged to ice and elevate more to help with swelling. Pt currently -4 to 106 degrees active left knee ROM. Pt's FOTO increased to 59% (predicted to 51%).  Continue skilled PT to maximize function.    OBJECTIVE IMPAIRMENTS Abnormal gait, decreased activity tolerance, decreased balance, decreased mobility, difficulty walking, decreased ROM, decreased strength, increased edema, impaired flexibility, and pain.    ACTIVITY LIMITATIONS lifting, bending, sitting, standing, sleeping, stairs, transfers, and dressing   PARTICIPATION LIMITATIONS: cleaning, driving, and shopping   PERSONAL FACTORS   HTN, MI, A-fib, CAD, L lumbar laminectomy, atherosclerosis, angina, HTN,  are also affecting patient's functional outcome.    REHAB POTENTIAL: Good   CLINICAL DECISION MAKING: Stable/uncomplicated   EVALUATION COMPLEXITY: Low     GOALS: Goals reviewed with patient? Yes   SHORT TERM GOALS: Target date: 07/06/2022  Patient will demonstrate independent use of initial home exercise program to maintain progress from in clinic treatments. Goal status: MET 06/14/22         2. Pt will be able to navigate curb step with st cane modified independently.                    A. Goal Status: MET 06/14/22        Long term PT goals (target dates for all long term goals are 8 weeks  07/30/2022 ) Patient will demonstrate/report pain at worst less than or equal to 2/10 with ADL's.  Goal status: New   Patient will demonstrate independent use of home exercise program to facilitate ability to maintain/progress functional gains from skilled physical therapy services. Goal status: On-going 06/21/22   Patient will demonstrate FOTO outcome > or = 51 % to indicate reduced disability due  to condition. Goal status: MET 06/21/22   Pt will be able to amb c no device community surfaces with normalize gait pattern.  Goal status: On-going 06/21/22       5.  Pt will be able to navigate 1 flight of stairs with single hand rail with step  over step pattern.   Goal status: On-going 06/21/22   6. Pt will improve Left knee flexion to >/= 120 degrees to perform functional mobility.      A. Goal Status: On-going 06/21/22     PLAN: PT FREQUENCY:  2-3 x/ week    PT DURATION: 8 weeks   PLANNED INTERVENTIONS: Therapeutic exercises, Therapeutic activity, Neuromuscular re-education, Balance training, Gait training, Patient/Family education, Joint mobilization, Stair training, Electrical stimulation, Cryotherapy, Moist heat, Taping, Vasopneumatic device, and Manual therapy   PLAN FOR NEXT SESSION: Progress functional mobiltiy, balance and community surfaces compliance    Kearney Hard, PT, MPT 06/21/22 10:59 AM   06/21/22  10:59 AM    [facility] [address] [place], [place], [address] Phone: [phone]   Fax:  [phone]

## 2022-06-23 ENCOUNTER — Ambulatory Visit: Payer: Medicare Other | Admitting: Physical Therapy

## 2022-06-23 ENCOUNTER — Encounter: Payer: Self-pay | Admitting: Physical Therapy

## 2022-06-23 DIAGNOSIS — M25662 Stiffness of left knee, not elsewhere classified: Secondary | ICD-10-CM

## 2022-06-23 DIAGNOSIS — M6281 Muscle weakness (generalized): Secondary | ICD-10-CM

## 2022-06-23 DIAGNOSIS — M25562 Pain in left knee: Secondary | ICD-10-CM | POA: Diagnosis not present

## 2022-06-23 DIAGNOSIS — R600 Localized edema: Secondary | ICD-10-CM

## 2022-06-23 DIAGNOSIS — R262 Difficulty in walking, not elsewhere classified: Secondary | ICD-10-CM

## 2022-06-23 NOTE — Therapy (Signed)
[facility] [address] [place], [place], [address] Phone: [phone]   Fax:  [phone]  Patient Details  Name: Adira Limburg MRN: [id number] Date of Birth: 01/15/1941 Referring Provider:  No ref. provider found  Encounter Date: 06/23/2022    OUTPATIENT PHYSICAL THERAPY TREATMENT NOTE    Patient Name: Meline Russaw MRN: [id number] DOB:09/17/1941, 81 y.o., female Today's Date: 06/23/2022  PCP: Prince Solian, MD REFERRING PROVIDER: Mcarthur Rossetti, MD     See note below for Objective Data and Assessment of Progress/Goals.      END OF SESSION:   PT End of Session - 06/23/22 0937     Visit Number 10    Number of Visits 24    Date for PT Re-Evaluation 07/30/22    Authorization Type [employer] medicare, PN sent at visit 9    Progress Note Due on Visit 36    PT Start Time 0930    PT Stop Time 1015    PT Time Calculation (min) 45 min    Activity Tolerance Patient tolerated treatment well    Behavior During Therapy WFL for tasks assessed/performed                    Past Medical History:  Diagnosis Date   Anginal pain ([place]) 1989   Anxiety    Arthritis    Atherosclerosis    Atrial fibrillation ([place])    Coronary artery disease    Dysrhythmia    a-fib   Elevated liver enzymes    GERD (gastroesophageal reflux disease)    Heart attack ([place]) 2020   Heart disease    High cholesterol    Hypertension    NSTEMI (non-ST elevated myocardial infarction) ([place]) 07/09/2019   Past Surgical History:  Procedure Laterality Date   ABDOMINAL HYSTERECTOMY  1983   for endometriosis with [place]   ANGIOPLASTY     ANGIOPLASTY     BACK SURGERY     1990   CARDIAC SURGERY     Catheterization   CORONARY STENT INTERVENTION N/A 07/09/2019   Procedure: CORONARY STENT INTERVENTION;  Surgeon: Adrian Prows, MD;  Location: [facility];  Service: Cardiovascular;  Laterality: N/A;   LEFT HEART CATH AND CORONARY ANGIOGRAPHY  N/A 07/09/2019   Procedure: LEFT HEART CATH AND CORONARY ANGIOGRAPHY and possible intervention;  Surgeon: Adrian Prows, MD;  Location: [facility];  Service: Cardiovascular;  Laterality: N/A;  4: or 4:30 PM today   LUMBAR LAMINECTOMY/DECOMPRESSION MICRODISCECTOMY Left 02/02/2022   Procedure: Left Lumbar two-three, Lumbar five-Sacral one Sublaminar decompression;  Surgeon: Kristeen Miss, MD;  Location: [facility];  Service: Neurosurgery;  Laterality: Left;   NECK SURGERY     TOTAL KNEE ARTHROPLASTY Left 10/07/2020   Procedure: TOTAL KNEE ARTHROPLASTY;  Surgeon: Paralee Cancel, MD;  Location: [facility];  Service: Orthopedics;  Laterality: Left;  70 mins   TOTAL KNEE REVISION Left 05/13/2022   Procedure: SYNOVECTOMY AND LEFT KNEE POLY-LINER EXCHANGE;  Surgeon: Mcarthur Rossetti, MD;  Location: [facility];  Service: Orthopedics;  Laterality: Left;   Patient Active Problem List   Diagnosis Date Noted   Status post revision of total replacement of left knee 05/13/2022   Degenerative lumbar spinal stenosis 02/02/2022   S/P left TKA 10/07/2020   Left knee OA 10/07/2020   Syncope 01/16/2020   Anemia 01/16/2020   Thrombocytopenia ([place]) 01/16/2020   Paroxysmal atrial fibrillation ([place]) 02/15/2019   Mixed hyperlipidemia 02/15/2019   Bradycardia 02/15/2019   Coronary  artery disease involving native coronary artery of native heart with unstable angina pectoris ([place]) 12/20/2018   Dyslipidemia 12/20/2018   Essential hypertension 12/20/2018    REFERRING DIAG: C37.628 (ICD-10-CM) - Status post revision of total knee, left  ONSET DATE: 05/13/2022  THERAPY DIAG:  Acute pain of left knee  Stiffness of left knee, not elsewhere classified  Muscle weakness (generalized)  Localized edema  Difficulty in walking, not elsewhere classified  Rationale for Evaluation and Treatment Rehabilitation  PERTINENT HISTORY: HTN, MI, A-fib, CAD, L lumbar laminectomy, atherosclerosis, angina, HTN  PRECAUTIONS:  None  SUBJECTIVE: Pt stating she was more stiff today due to waking up late.   PAIN:   NPRS scale: 4-5/10 today at worst Pain location: left knee more in front/medial Pain description: achy & burning, tightness Aggravating factors: exercising bending knee Relieving factors: laying down, meds & ice  OBJECTIVE: (objective measures completed at initial evaluation unless otherwise dated)    PATIENT SURVEYS:  05/31/22: FOTO 36% (predicted 51%)     06/21/22: FOTO 59%                    SENSATION: 06/01/2022:  WFL   EDEMA:  06/01/2022:  Circumferential: Rt knee : 37.5 centimeters 06/01/2022:  Circumferential: Left knee: 39 centimeters   MUSCLE LENGTH: 06/01/2022:  Hamstrings: Right 86 deg; Left 75 deg  POSTURE: 06/01/2022:  rounded shoulders and forward head  PALPATION: 06/01/2022:  Tender on medial and lateral joint line with more pain reported on medial side  LOWER EXTREMITY ROM:  Active ROM Right Eval 06/01/22 Left Eval 06/01/22 06/07/22 06/09/2022 06/14/22  06/21/22 06/23/22   Hip flexion           Hip extension           Hip abduction           Hip adduction           Knee flexion 125 80 90 AAROM  Supine AROM heel slide: 97 Supine Active 96 Supine Active 106 Supine  Active 108  Knee extension 0 -4 -4  -4 -4 -5   (Blank rows = not tested)   Passive ROM Left Eval 06/01/22 Left 06/14/22 supine Left 06/16/22 supine Left 06/21/22 supine  Hip flexion       Hip extension       Hip abduction       Hip adduction       Knee flexion 88 100 110 110  Knee extension -2 -2 -2 -2       LOWER EXTREMITY MMT:  MMT Right Eval 06/01/22 Left Eval 06/01/22  Hip flexion 5/5 4/5  Hip extension      Hip abduction 4/5 4/5  Hip adduction 4/5 4/5  Hip internal rotation      Hip external rotation      Knee flexion 5/5 2/5  Knee extension 5/5 2/5  Ankle dorsiflexion 5/5 5/5  Ankle plantarflexion 5/5 5/5  Ankle inversion      Ankle eversion       (Blank rows = not tested)        FUNCTIONAL TESTS:  06/01/2022:  5 times sit to stand: 15 seconds with UE support   GAIT: 06/01/2022:  Distance walked: 50 feet  Assistive device utilized: Environmental consultant - 2 wheeled Level of assistance: Modified independence Comments: antalgic gait pattern, decreased left knee flexion and hip flexion     TODAY'S TREATMENT: 06/23/22: Therapeutic Exercise: Recumbent bike seat 3 x 6 minutes, partial to full revolutions  Slant board x 3 holding 20 seconds Leg Curl machine: Left LE only 10# x 10, 15# x 10 Leg Extension: bil LE's lifting 5#, lowering c Left LE x 15 Leg press : double leg 75# 3 x 10, Lt leg 37# 3 x 10   Step up 6 inch with no UE support x 15   Side stepping x 20 feet both directions Neuro Re-ed  Fitter First 2 point board: side to side x 1 minute with UE support  Fitter First 2 point board: forward/back x 1 minute with UE support  SLS: Left LE level ground x 20 seconds c finger tap support Modalities: vasopnuematic X 10 min to left medium compression 34 degrees    06/21/22: Therapeutic Exercise: Recumbent bike seat 3 x 6 minutes, partial to full revolutions Slant board x 3 holding 20 seconds Seated LAQ c 4 # 2 x 10 Seated Lt SLR x 15   TherActivity (to improve stairs, transfers, ambulation)  Leg press : double leg 62# x 2 x 10, Lt leg 2 x 10 37#   Lateral step down 4 inch step eccentric lowering x 10 c single hand rail use  Side stepping x 20 feet both directions  Sit to stand x 15 from with no UE support  Neuro Re-ed  SLS: Left LE  level ground with finger tap support on TM bar    Modalities: vasopnuematic X 10 min to left medium compression 34 degrees   06/17/22: Therapeutic Exercise: UBE LE only Lvl 1.0 full revolutions , seat 12 Gastroc runner stretch on incline board Lt leg posterior 30 sec x 3 Seated LAQ pause in end range flexion and extension Lt 4 lbs 2 x 15  Seated Lt SLR x 15 (cues for home use)   TherActivity (to improve stairs, transfers,  ambulation)  Leg press : double leg 56 lbs x 15, Lt leg 2 x 15 37 lbs   Lateral step down 4 inch step eccentric lowering x 10 c single hand rail use  Step on over and down WB on Lt leg 4 inch x 10 c single hand rail use  Neuro Re-ed  SLS vector reaching fwd/lateral/side c contralateral leg occasional hand assist on table x 6 bilateral  Standing alternating heel/toe raise 20x c SBA  Modalities: vasopnuematic X 10 min to left medium compression 34 degrees         PATIENT EDUCATION:  Education details: PT POC, HEP Person educated: Patient Education method: Consulting civil engineer, Demonstration, Corporate treasurer cues, Verbal cues, and Handouts Education comprehension: verbalized understanding, returned demonstration, and needs further education     HOME EXERCISE PROGRAM: Access Code: [email or web address] URL: [email or web address] Date: 06/01/2022 Prepared by: Kearney Hard   Exercises - Supine Straight Leg Raises  - 3 x daily - 7 x weekly - 2 sets - 10 reps - Supine Heel Slide with Strap  - 3 x daily - 7 x weekly - 2 sets - 10 reps - Long Sitting Quad Set with Towel Roll Under Heel  - 3 x daily - 7 x weekly - 2 sets - 10 reps - 5 seconds hold - Seated Long Arc Quad  - 3 x daily - 7 x weekly - 2 sets - 10 reps - 3 seconds hold - Seated Knee Flexion AAROM  - 3 x daily - 7 x weekly - 10 reps - 10 seconds hold   ASSESSMENT:   CLINICAL IMPRESSION: Pt amb today with no assistive device. Pt with step through gait pattern with  mild antalgic gait. Pt tolerating all exercises well. Pt encouraged to ice and elevate more to help with swelling. Pt currently -4 to 106 degrees active left knee ROM. Pt's FOTO increased to 59% (predicted to 51%).  Continue skilled PT to maximize function.    OBJECTIVE IMPAIRMENTS Abnormal gait, decreased activity tolerance, decreased balance, decreased mobility, difficulty walking, decreased ROM, decreased strength, increased edema, impaired flexibility, and pain.     ACTIVITY LIMITATIONS lifting, bending, sitting, standing, sleeping, stairs, transfers, and dressing   PARTICIPATION LIMITATIONS: cleaning, driving, and shopping   PERSONAL FACTORS   HTN, MI, A-fib, CAD, L lumbar laminectomy, atherosclerosis, angina, HTN,  are also affecting patient's functional outcome.    REHAB POTENTIAL: Good   CLINICAL DECISION MAKING: Stable/uncomplicated   EVALUATION COMPLEXITY: Low     GOALS: Goals reviewed with patient? Yes   SHORT TERM GOALS: Target date: 07/06/2022  Patient will demonstrate independent use of initial home exercise program to maintain progress from in clinic treatments. Goal status: MET 06/14/22         2. Pt will be able to navigate curb step with st cane modified independently.                    A. Goal Status: MET 06/14/22        Long term PT goals (target dates for all long term goals are 8 weeks  07/30/2022 ) Patient will demonstrate/report pain at worst less than or equal to 2/10 with ADL's.  Goal status: New   Patient will demonstrate independent use of home exercise program to facilitate ability to maintain/progress functional gains from skilled physical therapy services. Goal status: On-going 06/21/22   Patient will demonstrate FOTO outcome > or = 51 % to indicate reduced disability due to condition. Goal status: MET 06/21/22   Pt will be able to amb c no device community surfaces with normalize gait pattern.  Goal status: On-going 06/21/22       5.  Pt will be able to navigate 1 flight of stairs with single hand rail with step  over step pattern.   Goal status: On-going 06/21/22   6. Pt will improve Left knee flexion to >/= 120 degrees to perform functional mobility.      A. Goal Status: On-going 06/21/22     PLAN: PT FREQUENCY:  2-3 x/ week    PT DURATION: 8 weeks   PLANNED INTERVENTIONS: Therapeutic exercises, Therapeutic activity, Neuromuscular re-education, Balance training, Gait training, Patient/Family education,  Joint mobilization, Stair training, Electrical stimulation, Cryotherapy, Moist heat, Taping, Vasopneumatic device, and Manual therapy   PLAN FOR NEXT SESSION: Progress functional mobiltiy, AROM,  balance and community surfaces compliance    Kearney Hard, PT, MPT 06/23/22 10:08 AM   06/23/22  10:08 AM    [facility] [address] [place], [place], [address] Phone: [phone]   Fax:  [phone]

## 2022-06-24 ENCOUNTER — Ambulatory Visit (INDEPENDENT_AMBULATORY_CARE_PROVIDER_SITE_OTHER): Payer: Medicare Other | Admitting: Orthopaedic Surgery

## 2022-06-24 ENCOUNTER — Encounter: Payer: Self-pay | Admitting: Orthopaedic Surgery

## 2022-06-24 DIAGNOSIS — Z96652 Presence of left artificial knee joint: Secondary | ICD-10-CM

## 2022-06-24 NOTE — Progress Notes (Signed)
The patient is now 6 weeks status post lysis of adhesions of the left knee with a polyliner exchange.  Her primary total knee arthroplasty was done by one of my colleagues in town and well after a year from surgery was still hurting enough that we have tried and failed all forms of conservative treatment measures.  They are removing some scar tissue and changed her polyliner.  We upsized a little bit now she feels like her knee feels much better overall.  It is still somewhat painful but she says it is absolutely different.  She is 81 years old and very active.  She has been dealing with some right shoulder pain and right knee pain and feeling things worsen we recommended steroid injections and she said she will come in earlier if there is worsening of her right knee pain and right shoulder pain.  From a left knee standpoint, I do not need to see her back for 3 months.  At that visit we will have a standing AP and lateral of her left knee.

## 2022-06-28 ENCOUNTER — Encounter: Payer: Self-pay | Admitting: Physical Therapy

## 2022-06-28 ENCOUNTER — Ambulatory Visit (INDEPENDENT_AMBULATORY_CARE_PROVIDER_SITE_OTHER): Payer: Medicare Other | Admitting: Physical Therapy

## 2022-06-28 DIAGNOSIS — R600 Localized edema: Secondary | ICD-10-CM

## 2022-06-28 DIAGNOSIS — M25562 Pain in left knee: Secondary | ICD-10-CM

## 2022-06-28 DIAGNOSIS — M6281 Muscle weakness (generalized): Secondary | ICD-10-CM | POA: Diagnosis not present

## 2022-06-28 DIAGNOSIS — M25662 Stiffness of left knee, not elsewhere classified: Secondary | ICD-10-CM

## 2022-06-28 DIAGNOSIS — R262 Difficulty in walking, not elsewhere classified: Secondary | ICD-10-CM

## 2022-06-28 NOTE — Therapy (Signed)
[facility] [address] [place], [place], [address] Phone: [phone]   Fax:  [phone]  Patient Details  Name: Erica Frank MRN: [id number] Date of Birth: 1940/12/24 Referring Provider:  No ref. provider found  Encounter Date: 06/28/2022    OUTPATIENT PHYSICAL THERAPY TREATMENT NOTE    Patient Name: Erica Frank MRN: [id number] DOB:1941/09/15, 81 y.o., female Today's Date: 06/28/2022  PCP: Prince Solian, MD REFERRING PROVIDER: Mcarthur Rossetti, MD     See note below for Objective Data and Assessment of Progress/Goals.      END OF SESSION:   PT End of Session - 06/28/22 1050     Visit Number 11    Number of Visits 24    Date for PT Re-Evaluation 07/30/22    Authorization Type [employer] medicare, PN sent at visit 9    Progress Note Due on Visit 19    PT Start Time 1010    PT Stop Time 1100    PT Time Calculation (min) 50 min    Activity Tolerance Patient tolerated treatment well    Behavior During Therapy WFL for tasks assessed/performed                     Past Medical History:  Diagnosis Date   Anginal pain ([place]) 1989   Anxiety    Arthritis    Atherosclerosis    Atrial fibrillation ([place])    Coronary artery disease    Dysrhythmia    a-fib   Elevated liver enzymes    GERD (gastroesophageal reflux disease)    Heart attack ([place]) 2020   Heart disease    High cholesterol    Hypertension    NSTEMI (non-ST elevated myocardial infarction) ([place]) 07/09/2019   Past Surgical History:  Procedure Laterality Date   ABDOMINAL HYSTERECTOMY  1983   for endometriosis with [place]   ANGIOPLASTY     ANGIOPLASTY     BACK SURGERY     1990   CARDIAC SURGERY     Catheterization   CORONARY STENT INTERVENTION N/A 07/09/2019   Procedure: CORONARY STENT INTERVENTION;  Surgeon: Adrian Prows, MD;  Location: [facility];  Service: Cardiovascular;  Laterality: N/A;   LEFT HEART CATH AND CORONARY  ANGIOGRAPHY N/A 07/09/2019   Procedure: LEFT HEART CATH AND CORONARY ANGIOGRAPHY and possible intervention;  Surgeon: Adrian Prows, MD;  Location: [facility];  Service: Cardiovascular;  Laterality: N/A;  4: or 4:30 PM today   LUMBAR LAMINECTOMY/DECOMPRESSION MICRODISCECTOMY Left 02/02/2022   Procedure: Left Lumbar two-three, Lumbar five-Sacral one Sublaminar decompression;  Surgeon: Kristeen Miss, MD;  Location: [facility];  Service: Neurosurgery;  Laterality: Left;   NECK SURGERY     TOTAL KNEE ARTHROPLASTY Left 10/07/2020   Procedure: TOTAL KNEE ARTHROPLASTY;  Surgeon: Paralee Cancel, MD;  Location: [facility];  Service: Orthopedics;  Laterality: Left;  70 mins   TOTAL KNEE REVISION Left 05/13/2022   Procedure: SYNOVECTOMY AND LEFT KNEE POLY-LINER EXCHANGE;  Surgeon: Mcarthur Rossetti, MD;  Location: [facility];  Service: Orthopedics;  Laterality: Left;   Patient Active Problem List   Diagnosis Date Noted   Status post revision of total replacement of left knee 05/13/2022   Degenerative lumbar spinal stenosis 02/02/2022   S/P left TKA 10/07/2020   Left knee OA 10/07/2020   Syncope 01/16/2020   Anemia 01/16/2020   Thrombocytopenia ([place]) 01/16/2020   Paroxysmal atrial fibrillation ([place]) 02/15/2019   Mixed hyperlipidemia 02/15/2019   Bradycardia 02/15/2019  Coronary artery disease involving native coronary artery of native heart with unstable angina pectoris ([place]) 12/20/2018   Dyslipidemia 12/20/2018   Essential hypertension 12/20/2018    REFERRING DIAG: X65.537 (ICD-10-CM) - Status post revision of total knee, left  ONSET DATE: 05/13/2022  THERAPY DIAG:  Acute pain of left knee  Stiffness of left knee, not elsewhere classified  Muscle weakness (generalized)  Localized edema  Difficulty in walking, not elsewhere classified  Rationale for Evaluation and Treatment Rehabilitation  PERTINENT HISTORY: HTN, MI, A-fib, CAD, L lumbar laminectomy, atherosclerosis, angina,  HTN  PRECAUTIONS: None  SUBJECTIVE: Pt stating Dr. Ninfa Linden was pleased with her progress. All steri-strips were removed. Pt stating she is taking tramadol prn for pain which makes her extremely sleepy.   PAIN:   NPRS scale: 1/10 today at worst Pain location: left knee more in front/medial Pain description: achy & burning, tightness Aggravating factors: exercising bending knee Relieving factors: laying down, meds & ice  OBJECTIVE: (objective measures completed at initial evaluation unless otherwise dated)    PATIENT SURVEYS:  05/31/22: FOTO 36% (predicted 51%)     06/21/22: FOTO 59%                    SENSATION: 06/01/2022:  WFL   EDEMA:  06/01/2022:  Circumferential: Rt knee : 37.5 centimeters 06/01/2022:  Circumferential: Left knee: 39 centimeters   MUSCLE LENGTH: 06/01/2022:  Hamstrings: Right 86 deg; Left 75 deg  POSTURE: 06/01/2022:  rounded shoulders and forward head  PALPATION: 06/01/2022:  Tender on medial and lateral joint line with more pain reported on medial side  LOWER EXTREMITY ROM:  Active ROM Right Eval 06/01/22 Left Eval 06/01/22 06/07/22 06/09/2022 06/14/22  06/21/22 06/23/22  06/28/22  Hip flexion            Hip extension            Hip abduction            Hip adduction            Knee flexion 125 80 90 AAROM  Supine AROM heel slide: 97 Supine Active 96 Supine Active 106 Supine  Active 108 Supine  Active 110  Knee extension 0 -4 -4  -4 -4 -5 -4   (Blank rows = not tested)   Passive ROM Left Eval 06/01/22 Left 06/14/22 supine Left 06/16/22 supine Left 06/21/22 supine  Hip flexion       Hip extension       Hip abduction       Hip adduction       Knee flexion 88 100 110 110  Knee extension -2 -2 -2 -2       LOWER EXTREMITY MMT:  MMT Right Eval 06/01/22 Left Eval 06/01/22  Hip flexion 5/5 4/5  Hip extension      Hip abduction 4/5 4/5  Hip adduction 4/5 4/5  Hip internal rotation      Hip external rotation      Knee flexion 5/5 2/5   Knee extension 5/5 2/5  Ankle dorsiflexion 5/5 5/5  Ankle plantarflexion 5/5 5/5  Ankle inversion      Ankle eversion       (Blank rows = not tested)       FUNCTIONAL TESTS:  06/01/2022:  5 times sit to stand: 15 seconds with UE support   GAIT: 06/01/2022:  Distance walked: 50 feet  Assistive device utilized: Environmental consultant - 2 wheeled Level of assistance: Modified independence Comments: antalgic gait pattern, decreased  left knee flexion and hip flexion     TODAY'S TREATMENT: 06/28/22: Therapeutic Exercise: Recumbent bike seat 3 x 6 minutes, partial to full revolutions Slant board x 3 holding 20 seconds Leg Curl machine: Left LE only 15# 2 x 10 Leg Extension: bil LE's lifting 5#, lowering c Left LE 2 x 10 Leg press : double leg 81# 3 x 10, Lt leg 43# 3 x 10  Step up 6 inch with no UE support x 15  Neuro Re-ed Standing on Airex: tapping 2 cones placed at each corner of the mat x 20 c single UE support and then cones placed directly in front of pt to elicit more hip flexion  Diagonal Stepping toward 3 cones placed laterally Modalities: vasopnuematic X 10 min to left medium compression 34 degrees    06/23/22: Therapeutic Exercise: Recumbent bike seat 3 x 6 minutes, partial to full revolutions Slant board x 3 holding 20 seconds Leg Curl machine: Left LE only 10# x 10, 15# x 10 Leg Extension: bil LE's lifting 5#, lowering c Left LE x 15 Leg press : double leg 75# 3 x 10, Lt leg 37# 3 x 10   Step up 6 inch with no UE support x 15   Side stepping x 20 feet both directions Neuro Re-ed  [place] 2 point board: side to side x 1 minute with UE support  F[employer] 2 point board: forward/back x 1 minute with UE support  SLS: Left LE level ground x 20 seconds c finger tap support Modalities: vasopnuematic X 10 min to left medium compression 34 degrees    06/21/22: Therapeutic Exercise: Recumbent bike seat 3 x 6 minutes, partial to full revolutions Slant board x 3 holding 20  seconds Seated LAQ c 4 # 2 x 10 Seated Lt SLR x 15   TherActivity (to improve stairs, transfers, ambulation)  Leg press : double leg 62# x 2 x 10, Lt leg 2 x 10 37#   Lateral step down 4 inch step eccentric lowering x 10 c single hand rail use  Side stepping x 20 feet both directions  Sit to stand x 15 from with no UE support  Neuro Re-ed  SLS: Left LE  level ground with finger tap support on TM bar    Modalities: vasopnuematic X 10 min to left medium compression 34 degrees       PATIENT EDUCATION:  Education details: PT POC, HEP Person educated: Patient Education method: Consulting civil engineer, Demonstration, Tactile cues, Verbal cues, and Handouts Education comprehension: verbalized understanding, returned demonstration, and needs further education     HOME EXERCISE PROGRAM: Access Code: [email or web address] URL: [email or web address] Date: 06/01/2022 Prepared by: Kearney Hard   Exercises - Supine Straight Leg Raises  - 3 x daily - 7 x weekly - 2 sets - 10 reps - Supine Heel Slide with Strap  - 3 x daily - 7 x weekly - 2 sets - 10 reps - Long Sitting Quad Set with Towel Roll Under Heel  - 3 x daily - 7 x weekly - 2 sets - 10 reps - 5 seconds hold - Seated Long Arc Quad  - 3 x daily - 7 x weekly - 2 sets - 10 reps - 3 seconds hold - Seated Knee Flexion AAROM  - 3 x daily - 7 x weekly - 10 reps - 10 seconds hold   ASSESSMENT:   CLINICAL IMPRESSION: Pt amb today with no assistive device. Pt with step through gait pattern with mild  antalgic gait reporting less pain and more stiffness. Pt tolerating all exercises well. Pt currently -4 to 108 degrees active left knee ROM. Continue skilled PT to maximize function.    OBJECTIVE IMPAIRMENTS Abnormal gait, decreased activity tolerance, decreased balance, decreased mobility, difficulty walking, decreased ROM, decreased strength, increased edema, impaired flexibility, and pain.    ACTIVITY LIMITATIONS lifting, bending, sitting,  standing, sleeping, stairs, transfers, and dressing   PARTICIPATION LIMITATIONS: cleaning, driving, and shopping   PERSONAL FACTORS   HTN, MI, A-fib, CAD, L lumbar laminectomy, atherosclerosis, angina, HTN,  are also affecting patient's functional outcome.    REHAB POTENTIAL: Good   CLINICAL DECISION MAKING: Stable/uncomplicated   EVALUATION COMPLEXITY: Low     GOALS: Goals reviewed with patient? Yes   SHORT TERM GOALS: Target date: 07/06/2022  Patient will demonstrate independent use of initial home exercise program to maintain progress from in clinic treatments. Goal status: MET 06/14/22   2. Pt will be able to navigate curb step with st cane modified independently.                    A. Goal Status: MET 06/14/22        Long term PT goals (target dates for all long term goals are 8 weeks  07/30/2022 ) Patient will demonstrate/report pain at worst less than or equal to 2/10 with ADL's.  Goal status: On-going 06/28/22   Patient will demonstrate independent use of home exercise program to facilitate ability to maintain/progress functional gains from skilled physical therapy services. Goal status: On-going 06/28/22   Patient will demonstrate FOTO outcome > or = 51 % to indicate reduced disability due to condition. Goal status: MET 06/21/22   Pt will be able to amb c no device community surfaces with normalize gait pattern.  Goal status: On-going 06/28/22       5.  Pt will be able to navigate 1 flight of stairs with single hand rail with step  over step pattern.   Goal status: On-going 06/28/22   6. Pt will improve Left knee flexion to >/= 120 degrees to perform functional mobility.      A. Goal Status: On-going 06/28/22     PLAN: PT FREQUENCY:  2-3 x/ week    PT DURATION: 8 weeks   PLANNED INTERVENTIONS: Therapeutic exercises, Therapeutic activity, Neuromuscular re-education, Balance training, Gait training, Patient/Family education, Joint mobilization, Stair training, Electrical  stimulation, Cryotherapy, Moist heat, Taping, Vasopneumatic device, and Manual therapy   PLAN FOR NEXT SESSION: Progress strengthening and  functional mobiltiy, AROM,  balance and community surfaces compliance    Kearney Hard, PT, MPT 06/28/22 11:06 AM   06/28/22  11:06 AM    [facility] [address] [place], [place], [address] Phone: [phone]   Fax:  [phone]

## 2022-06-30 ENCOUNTER — Encounter: Payer: Medicare Other | Admitting: Physical Therapy

## 2022-06-30 ENCOUNTER — Encounter: Payer: Self-pay | Admitting: Physical Therapy

## 2022-06-30 ENCOUNTER — Ambulatory Visit (INDEPENDENT_AMBULATORY_CARE_PROVIDER_SITE_OTHER): Payer: Medicare Other | Admitting: Physical Therapy

## 2022-06-30 ENCOUNTER — Ambulatory Visit: Payer: Medicare Other

## 2022-06-30 DIAGNOSIS — M25562 Pain in left knee: Secondary | ICD-10-CM | POA: Diagnosis not present

## 2022-06-30 DIAGNOSIS — M6281 Muscle weakness (generalized): Secondary | ICD-10-CM

## 2022-06-30 DIAGNOSIS — M25662 Stiffness of left knee, not elsewhere classified: Secondary | ICD-10-CM

## 2022-06-30 DIAGNOSIS — R600 Localized edema: Secondary | ICD-10-CM

## 2022-06-30 DIAGNOSIS — R262 Difficulty in walking, not elsewhere classified: Secondary | ICD-10-CM

## 2022-06-30 NOTE — Therapy (Signed)
[facility] [address] [place], [place], [address] Phone: [phone]   Fax:  [phone]  Patient Details  Name: Erica Frank MRN: [id number] Date of Birth: Mar 10, 1941 Referring Provider:  Prince Solian, MD  Encounter Date: 06/30/2022    OUTPATIENT PHYSICAL THERAPY TREATMENT NOTE    Patient Name: Erica Frank MRN: [id number] DOB:January 01, 1941, 81 y.o., female Today's Date: 06/30/2022  PCP: Prince Solian, MD REFERRING PROVIDER: Mcarthur Rossetti, MD      END OF SESSION:   PT End of Session - 06/30/22 1301     Visit Number 12    Number of Visits 24    Date for PT Re-Evaluation 07/30/22    Authorization Type [employer] medicare, PN sent at visit 9    Progress Note Due on Visit 54    PT Start Time 1301    PT Stop Time 1410    PT Time Calculation (min) 69 min    Activity Tolerance Patient tolerated treatment well    Behavior During Therapy [facility] for tasks assessed/performed             Past Medical History:  Diagnosis Date   Anginal pain ([place]) 1989   Anxiety    Arthritis    Atherosclerosis    Atrial fibrillation ([place])    Coronary artery disease    Dysrhythmia    a-fib   Elevated liver enzymes    GERD (gastroesophageal reflux disease)    Heart attack ([place]) 2020   Heart disease    High cholesterol    Hypertension    NSTEMI (non-ST elevated myocardial infarction) ([place]) 07/09/2019   Past Surgical History:  Procedure Laterality Date   ABDOMINAL HYSTERECTOMY  1983   for endometriosis with Burch   ANGIOPLASTY     ANGIOPLASTY     BACK SURGERY     1990   CARDIAC SURGERY     Catheterization   CORONARY STENT INTERVENTION N/A 07/09/2019   Procedure: CORONARY STENT INTERVENTION;  Surgeon: Adrian Prows, MD;  Location: [facility];  Service: Cardiovascular;  Laterality: N/A;   LEFT HEART CATH AND CORONARY ANGIOGRAPHY N/A 07/09/2019   Procedure: LEFT HEART CATH AND CORONARY ANGIOGRAPHY and possible  intervention;  Surgeon: Adrian Prows, MD;  Location: [facility];  Service: Cardiovascular;  Laterality: N/A;  4: or 4:30 PM today   LUMBAR LAMINECTOMY/DECOMPRESSION MICRODISCECTOMY Left 02/02/2022   Procedure: Left Lumbar two-three, Lumbar five-Sacral one Sublaminar decompression;  Surgeon: Kristeen Miss, MD;  Location: [facility];  Service: Neurosurgery;  Laterality: Left;   NECK SURGERY     TOTAL KNEE ARTHROPLASTY Left 10/07/2020   Procedure: TOTAL KNEE ARTHROPLASTY;  Surgeon: Paralee Cancel, MD;  Location: [facility];  Service: Orthopedics;  Laterality: Left;  70 mins   TOTAL KNEE REVISION Left 05/13/2022   Procedure: SYNOVECTOMY AND LEFT KNEE POLY-LINER EXCHANGE;  Surgeon: Mcarthur Rossetti, MD;  Location: [facility];  Service: Orthopedics;  Laterality: Left;   Patient Active Problem List   Diagnosis Date Noted   Status post revision of total replacement of left knee 05/13/2022   Degenerative lumbar spinal stenosis 02/02/2022   S/P left TKA 10/07/2020   Left knee OA 10/07/2020   Syncope 01/16/2020   Anemia 01/16/2020   Thrombocytopenia ([place]) 01/16/2020   Paroxysmal atrial fibrillation ([place]) 02/15/2019   Mixed hyperlipidemia 02/15/2019   Bradycardia 02/15/2019   Coronary artery disease involving native coronary artery of native heart with unstable angina pectoris ([place]) 12/20/2018   Dyslipidemia 12/20/2018   Essential  hypertension 12/20/2018    REFERRING DIAG: B15.176 (ICD-10-CM) - Status post revision of total knee, left  ONSET DATE: 05/13/2022  THERAPY DIAG:  Acute pain of left knee  Stiffness of left knee, not elsewhere classified  Muscle weakness (generalized)  Localized edema  Difficulty in walking, not elsewhere classified  Rationale for Evaluation and Treatment Rehabilitation  PERTINENT HISTORY: HTN, MI, A-fib, CAD, L lumbar laminectomy, atherosclerosis, angina, HTN  PRECAUTIONS: None  SUBJECTIVE: She reports higher pain in the morning, and some exercise that she is  doing increases her pain so much that it is hurting the next day. She said her bike at home is difficult to get a full revolution around so it requires more rocking first.  PAIN:  NPRS scale: 2/10 today stiffness, highest since last appointment 5-6/10 Pain location: left knee more in front/medial Pain description: achy & burning, tightness Aggravating factors: exercising bending knee Relieving factors: laying down, meds & ice  OBJECTIVE: (objective measures completed at initial evaluation unless otherwise dated)    PATIENT SURVEYS:  05/31/22: FOTO 36% (predicted 51%)     06/21/22: FOTO 59%                    SENSATION: 06/01/2022:  WFL   EDEMA:  06/01/2022:  Circumferential: Rt knee : 37.5 centimeters 06/01/2022:  Circumferential: Left knee: 39 centimeters   MUSCLE LENGTH: 06/01/2022:  Hamstrings: Right 86 deg; Left 75 deg  POSTURE: 06/01/2022:  rounded shoulders and forward head  PALPATION: 06/01/2022:  Tender on medial and lateral joint line with more pain reported on medial side  LOWER EXTREMITY ROM:  Active ROM Right Eval 06/01/22 Left Eval 06/01/22 06/07/22 06/09/2022 06/14/22  06/21/22 06/23/22  06/28/22  Hip flexion            Hip extension            Hip abduction            Hip adduction            Knee flexion 125 80 90 AAROM  Supine AROM heel slide: 97 Supine Active 96 Supine Active 106 Supine  Active 108 Supine  Active 110  Knee extension 0 -4 -4  -4 -4 -5 -4   (Blank rows = not tested)   Passive ROM Left Eval 06/01/22 Left 06/14/22 supine Left 06/16/22 supine Left 06/21/22 supine  Hip flexion       Hip extension       Hip abduction       Hip adduction       Knee flexion 88 100 110 110  Knee extension -2 -2 -2 -2       LOWER EXTREMITY MMT:  MMT Right Eval 06/01/22 Left Eval 06/01/22  Hip flexion 5/5 4/5  Hip extension      Hip abduction 4/5 4/5  Hip adduction 4/5 4/5  Hip internal rotation      Hip external rotation      Knee flexion 5/5 2/5   Knee extension 5/5 2/5  Ankle dorsiflexion 5/5 5/5  Ankle plantarflexion 5/5 5/5  Ankle inversion      Ankle eversion       (Blank rows = not tested)       FUNCTIONAL TESTS:  06/01/2022:  5 times sit to stand: 15 seconds with UE support   GAIT: 06/01/2022:  Distance walked: 50 feet  Assistive device utilized: Environmental consultant - 2 wheeled Level of assistance: Modified independence Comments: antalgic gait pattern, decreased left knee  flexion and hip flexion     TODAY'S TREATMENT: 06/30/22: Therapeutic Exercise: Recumbent bike seat 3 x 8 minutes, 1 minute partial revolutions. PT educated on seat distance for biking at home and length of exercise, pt verbalized understanding PT had pt perform HEP to assess what may be causing increase in knee pain noted in subjective;  review HEP with x 10 reps each exercise and pt demo some other exercises that she has been doing at home;  pt required some correctional cues for exercises. PT recommended not using weight for flexion stretch supine with femur vertical to assess if this decreases pain.  If no difference than add wt back in stretch up to 3#. PT added to HEP (see below) with verbalized understanding from pt and handout supplied  Therapeutic Activity 2 steps up curb with no AD or UE support, pt self-recovered minor loss of balance. PT recommended only performing this activity with rail for safety.  11 stairs descending LLE stance with LUE handrail and RUE wall, verbal cueing for hands forward to allow anterior weight shift and ascending 5 steps LLE then 6 steps alternating with RUE handrail and LUE wall. PT educated to continue stairs for exercise at home when handrail available and to hold handrail in front of body, descend step-to pattern using LLE & ascend alternating. Pt verbalized understanding  Self-Care PT demo and verbal explanation of scar mobilization (lateral, vertical, circles) with pt verbalized understanding and returned demo PT demo &  verbal cues on using pillows for positioning in bed to "tent" or lift sheets off feet and pillow bw knees if sidelying. PT explained rationale to improve comfort with sleep. Pt verbalized understanding.   Modalities: vasopnuematic X 10 min to left medium compression 34 degrees While on vaso, PT educating and answering questions about exercise, swimming, and self-care (above). Pt verbalized understanding & appreciation for PT answering her questions.   06/28/22: Therapeutic Exercise: Recumbent bike seat 3 x 6 minutes, partial to full revolutions Slant board x 3 holding 20 seconds Leg Curl machine: Left LE only 15# 2 x 10 Leg Extension: bil LE's lifting 5#, lowering c Left LE 2 x 10 Leg press : double leg 81# 3 x 10, Lt leg 43# 3 x 10  Step up 6 inch with no UE support x 15  Neuro Re-ed Standing on Airex: tapping 2 cones placed at each corner of the mat x 20 c single UE support and then cones placed directly in front of pt to elicit more hip flexion  Diagonal Stepping toward 3 cones placed laterally Modalities: vasopnuematic X 10 min to left medium compression 34 degrees    06/23/22: Therapeutic Exercise: Recumbent bike seat 3 x 6 minutes, partial to full revolutions Slant board x 3 holding 20 seconds Leg Curl machine: Left LE only 10# x 10, 15# x 10 Leg Extension: bil LE's lifting 5#, lowering c Left LE x 15 Leg press : double leg 75# 3 x 10, Lt leg 37# 3 x 10   Step up 6 inch with no UE support x 15   Side stepping x 20 feet both directions Neuro Re-ed  Fitter First 2 point board: side to side x 1 minute with UE support  Fitter First 2 point board: forward/back x 1 minute with UE support  SLS: Left LE level ground x 20 seconds c finger tap support Modalities: vasopnuematic X 10 min to left medium compression 34 degrees   PATIENT EDUCATION:  Education details: PT POC, HEP Person  educated: Patient Education method: Explanation, Demonstration, Tactile cues, Verbal cues, and  Handouts Education comprehension: verbalized understanding, returned demonstration, and needs further education     HOME EXERCISE PROGRAM: Access Code: [email or web address] URL: [email or web address] Date: 06/30/2022 Prepared by: Jamey Reas  Exercises - Supine Straight Leg Raises  - 3 x daily - 7 x weekly - 2 sets - 10 reps - Supine Heel Slide with Strap  - 3 x daily - 7 x weekly - 2 sets - 10 reps - Long Sitting Quad Set with Towel Roll Under Heel  - 3 x daily - 7 x weekly - 2 sets - 10 reps - 5 seconds hold - Supine Single Leg Hip Flexion with Knee Bent  - 3 x daily - 7 x weekly - 1 sets - 3 reps - 30-60 seconds hold - Seated Long Arc Quad  - 3 x daily - 7 x weekly - 2 sets - 10 reps - 3 seconds hold - Seated Knee Flexion AAROM  - 3 x daily - 7 x weekly - 10 reps - 10 seconds hold - Standing March with Counter Support  - 3 x daily - 7 x weekly - 2 sets - 10 reps - 5 seconds hold - Standing Knee Flexion AROM with Chair Support  - 3 x daily - 7 x weekly - 2 sets - 10 reps - 5 seconds hold  Patient Education - Scar Massage   ASSESSMENT:   CLINICAL IMPRESSION: She tolerated the session well, starting the session by asking about what causes an increase in knee pain the following day. The HEP was reviewed with minor modifications for form, isometric holds and eccentric lowering, and pacing of activity. She noted a gravity assisted knee flexion stretch with a 3 lb ankle weight that could possibly cause too much irritation, so she was advised to reduce that to no ankle weight to see if day-after pain improves. She uses stairs for exercise with a Rt ascending rail and wall to the left, and navigated well with minor cues for hand placement to facilitate weight shift in descent. She demonstrated 2 steps up curb with minor loss of balance self-recovered by pt with no UE support, so she was not advised to practice stairs without a handrail yet. She was educated on scar massage after asking about  readiness to do so, and advised to begin swimming when she would like to because her scar is fully healing. She continues to benefit from skilled PT to address remaining strength and range deficits to improve functional mobility.   OBJECTIVE IMPAIRMENTS Abnormal gait, decreased activity tolerance, decreased balance, decreased mobility, difficulty walking, decreased ROM, decreased strength, increased edema, impaired flexibility, and pain.    ACTIVITY LIMITATIONS lifting, bending, sitting, standing, sleeping, stairs, transfers, and dressing   PARTICIPATION LIMITATIONS: cleaning, driving, and shopping   PERSONAL FACTORS   HTN, MI, A-fib, CAD, L lumbar laminectomy, atherosclerosis, angina, HTN,  are also affecting patient's functional outcome.    REHAB POTENTIAL: Good   CLINICAL DECISION MAKING: Stable/uncomplicated   EVALUATION COMPLEXITY: Low     GOALS: Goals reviewed with patient? Yes   SHORT TERM GOALS: Target date: 07/06/2022  Patient will demonstrate independent use of initial home exercise program to maintain progress from in clinic treatments. Goal status: MET 06/14/22   2. Pt will be able to navigate curb step with st cane modified independently.                    A. Goal  Status: MET 06/14/22        Long term PT goals (target dates for all long term goals are 8 weeks  07/30/2022 ) Patient will demonstrate/report pain at worst less than or equal to 2/10 with ADL's.  Goal status: On-going 06/28/22   Patient will demonstrate independent use of home exercise program to facilitate ability to maintain/progress functional gains from skilled physical therapy services. Goal status: On-going 06/28/22   Patient will demonstrate FOTO outcome > or = 51 % to indicate reduced disability due to condition. Goal status: MET 06/21/22   Pt will be able to amb c no device community surfaces with normalize gait pattern.  Goal status: On-going 06/28/22       5.  Pt will be able to navigate 1 flight of  stairs with single hand rail with step  over step pattern.   Goal status: On-going 06/28/22   6. Pt will improve Left knee flexion to >/= 120 degrees to perform functional mobility.      A. Goal Status: On-going 06/28/22     PLAN: PT FREQUENCY:  2-3 x/ week    PT DURATION: 8 weeks   PLANNED INTERVENTIONS: Therapeutic exercises, Therapeutic activity, Neuromuscular re-education, Balance training, Gait training, Patient/Family education, Joint mobilization, Stair training, Electrical stimulation, Cryotherapy, Moist heat, Taping, Vasopneumatic device, and Manual therapy   PLAN FOR NEXT SESSION: Continue to progress strengthening and  functional mobiltiy, AROM,  balance and community surfaces compliance, 4" step without UE support  Jana Hakim, SPT 06/30/22 2:27 PM  This entire session of physical therapy was performed under the direct supervision of PT signing evaluation /treatment. PT reviewed note and agrees.  Jamey Reas, PT, DPT 06/30/22  4:43 PM  [facility] [address] [place], [place], [address] Phone: [phone]   Fax:  [phone]

## 2022-07-05 ENCOUNTER — Encounter: Payer: Medicare Other | Admitting: Physical Therapy

## 2022-07-06 ENCOUNTER — Other Ambulatory Visit: Payer: Self-pay | Admitting: Cardiology

## 2022-07-07 ENCOUNTER — Ambulatory Visit: Payer: Medicare Other | Admitting: Obstetrics and Gynecology

## 2022-07-07 ENCOUNTER — Encounter: Payer: Medicare Other | Admitting: Physical Therapy

## 2022-07-12 ENCOUNTER — Ambulatory Visit: Payer: Medicare Other | Admitting: Physical Therapy

## 2022-07-12 ENCOUNTER — Encounter: Payer: Self-pay | Admitting: Physical Therapy

## 2022-07-12 DIAGNOSIS — M6281 Muscle weakness (generalized): Secondary | ICD-10-CM

## 2022-07-12 DIAGNOSIS — M25662 Stiffness of left knee, not elsewhere classified: Secondary | ICD-10-CM | POA: Diagnosis not present

## 2022-07-12 DIAGNOSIS — R262 Difficulty in walking, not elsewhere classified: Secondary | ICD-10-CM

## 2022-07-12 DIAGNOSIS — M25562 Pain in left knee: Secondary | ICD-10-CM | POA: Diagnosis not present

## 2022-07-12 DIAGNOSIS — R600 Localized edema: Secondary | ICD-10-CM

## 2022-07-12 NOTE — Therapy (Signed)
[facility] [address] [place], [place], [address] Phone: [phone]   Fax:  [phone]  Patient Details  Name: Erica Frank MRN: [id number] Date of Birth: 06-08-1941 Referring Provider:  Chilton Greathouse, MD  Encounter Date: 07/12/2022    OUTPATIENT PHYSICAL THERAPY TREATMENT NOTE    Patient Name: Erica Frank MRN: [id number] DOB:1941/06/04, 81 y.o., female Today's Date: 07/12/2022  PCP: Chilton Greathouse, MD REFERRING PROVIDER: Kathryne Hitch, MD      END OF SESSION:   PT End of Session - 07/12/22 1022     Visit Number 13    Number of Visits 24    Date for PT Re-Evaluation 07/30/22    Authorization Type [employer] medicare, PN sent at visit 9    Progress Note Due on Visit 19    PT Start Time 1016    PT Stop Time 1100    PT Time Calculation (min) 44 min    Activity Tolerance Patient tolerated treatment well    Behavior During Therapy [facility] for tasks assessed/performed              Past Medical History:  Diagnosis Date   Anginal pain (HCC) 1989   Anxiety    Arthritis    Atherosclerosis    Atrial fibrillation (HCC)    Coronary artery disease    Dysrhythmia    a-fib   Elevated liver enzymes    GERD (gastroesophageal reflux disease)    Heart attack (HCC) 2020   Heart disease    High cholesterol    Hypertension    NSTEMI (non-ST elevated myocardial infarction) (HCC) 07/09/2019   Past Surgical History:  Procedure Laterality Date   ABDOMINAL HYSTERECTOMY  1983   for endometriosis with Burch   ANGIOPLASTY     ANGIOPLASTY     BACK SURGERY     1990   CARDIAC SURGERY     Catheterization   CORONARY STENT INTERVENTION N/A 07/09/2019   Procedure: CORONARY STENT INTERVENTION;  Surgeon: Yates Decamp, MD;  Location: [facility];  Service: Cardiovascular;  Laterality: N/A;   LEFT HEART CATH AND CORONARY ANGIOGRAPHY N/A 07/09/2019   Procedure: LEFT HEART CATH AND CORONARY ANGIOGRAPHY and possible  intervention;  Surgeon: Yates Decamp, MD;  Location: [facility];  Service: Cardiovascular;  Laterality: N/A;  4: or 4:30 PM today   LUMBAR LAMINECTOMY/DECOMPRESSION MICRODISCECTOMY Left 02/02/2022   Procedure: Left Lumbar two-three, Lumbar five-Sacral one Sublaminar decompression;  Surgeon: Barnett Abu, MD;  Location: [facility] OR;  Service: Neurosurgery;  Laterality: Left;   NECK SURGERY     TOTAL KNEE ARTHROPLASTY Left 10/07/2020   Procedure: TOTAL KNEE ARTHROPLASTY;  Surgeon: Durene Romans, MD;  Location: [facility] ORS;  Service: Orthopedics;  Laterality: Left;  70 mins   TOTAL KNEE REVISION Left 05/13/2022   Procedure: SYNOVECTOMY AND LEFT KNEE POLY-LINER EXCHANGE;  Surgeon: Kathryne Hitch, MD;  Location: [facility] OR;  Service: Orthopedics;  Laterality: Left;   Patient Active Problem List   Diagnosis Date Noted   Status post revision of total replacement of left knee 05/13/2022   Degenerative lumbar spinal stenosis 02/02/2022   S/P left TKA 10/07/2020   Left knee OA 10/07/2020   Syncope 01/16/2020   Anemia 01/16/2020   Thrombocytopenia (HCC) 01/16/2020   Paroxysmal atrial fibrillation (HCC) 02/15/2019   Mixed hyperlipidemia 02/15/2019   Bradycardia 02/15/2019   Coronary artery disease involving native coronary artery of native heart with unstable angina pectoris (HCC) 12/20/2018   Dyslipidemia 12/20/2018  Essential hypertension 12/20/2018    REFERRING DIAG: W97.989 (ICD-10-CM) - Status post revision of total knee, left  ONSET DATE: 05/13/2022  THERAPY DIAG:  Acute pain of left knee  Stiffness of left knee, not elsewhere classified  Muscle weakness (generalized)  Localized edema  Difficulty in walking, not elsewhere classified  Rationale for Evaluation and Treatment Rehabilitation  PERTINENT HISTORY: HTN, MI, A-fib, CAD, L lumbar laminectomy, atherosclerosis, angina, HTN  PRECAUTIONS: None  SUBJECTIVE: Pt arriving today after taking last week off from therapy due to her  daughter who lives with her having COVID.   PAIN:  NPRS scale: 1/10 today more stiffness Pain location: left knee more in front/medial Pain description: achy & burning, tightness Aggravating factors: exercising bending knee Relieving factors: laying down, meds & ice  OBJECTIVE: (objective measures completed at initial evaluation unless otherwise dated)    PATIENT SURVEYS:  05/31/22: FOTO 36% (predicted 51%)     06/21/22: FOTO 59%   07/12/22: FOTO  43%                   SENSATION: 06/01/2022:  WFL   EDEMA:  06/01/2022:  Circumferential: Rt knee : 37.5 centimeters 06/01/2022:  Circumferential: Left knee: 39 centimeters   MUSCLE LENGTH: 06/01/2022:  Hamstrings: Right 86 deg; Left 75 deg  POSTURE: 06/01/2022:  rounded shoulders and forward head  PALPATION: 06/01/2022:  Tender on medial and lateral joint line with more pain reported on medial side  LOWER EXTREMITY ROM:  Active ROM Right Eval 06/01/22 Left Eval 06/01/22 06/07/22 06/09/2022 06/14/22  06/21/22 06/23/22  06/28/22 07/12/22  Hip flexion             Hip extension             Hip abduction             Hip adduction             Knee flexion 125 80 90 AAROM  Supine AROM heel slide: 97 Supine Active 96 Supine Active 106 Supine  Active 108 Supine  Active 110 Supine Acitve 115  Knee extension 0 -4 -4  -4 -4 -5 -4 -3   (Blank rows = not tested)   Passive ROM Left Eval 06/01/22 Left 06/14/22 supine Left 06/16/22 supine Left 06/21/22 supine  Hip flexion       Hip extension       Hip abduction       Hip adduction       Knee flexion 88 100 110 110  Knee extension -2 -2 -2 -2       LOWER EXTREMITY MMT:  MMT Right Eval 06/01/22 Left Eval 06/01/22  Hip flexion 5/5 4/5  Hip extension      Hip abduction 4/5 4/5  Hip adduction 4/5 4/5  Hip internal rotation      Hip external rotation      Knee flexion 5/5 2/5  Knee extension 5/5 2/5  Ankle dorsiflexion 5/5 5/5  Ankle plantarflexion 5/5 5/5  Ankle inversion       Ankle eversion       (Blank rows = not tested)       FUNCTIONAL TESTS:  06/01/2022:  5 times sit to stand: 15 seconds with UE support 07/12/22: 5 time sit to stand: 8 seconds with no UE support   GAIT: 06/01/2022:  Distance walked: 50 feet  Assistive device utilized: Environmental consultant - 2 wheeled Level of assistance: Modified independence Comments: antalgic gait pattern, decreased left knee flexion and  hip flexion     TODAY'S TREATMENT: 07/12/22: Therapeutic Exercise: Nustep x 8 minutes Level 5 Slant board x 3 holding 20 seconds Leg Curl machine: Left LE only 15# x 15 Leg Extension: bil LE's lifting 5#, lowering c Left LE 2 x 10 Leg press : double leg 87# 2 x 15, Lt leg 50# 2 x 15  Step up 4  inch with no UE support x 15  Neuro Re-ed Side stepping 20 feet x 2  Rocker board x 2 minutes Tandem stance each LE in front x 30 seconds Modalities: vasopnuematic X 10 min to left medium compression 34 degrees    06/30/22: Therapeutic Exercise: Recumbent bike seat 3 x 8 minutes, 1 minute partial revolutions. PT educated on seat distance for biking at home and length of exercise, pt verbalized understanding PT had pt perform HEP to assess what may be causing increase in knee pain noted in subjective;  review HEP with x 10 reps each exercise and pt demo some other exercises that she has been doing at home;  pt required some correctional cues for exercises. PT recommended not using weight for flexion stretch supine with femur vertical to assess if this decreases pain.  If no difference than add wt back in stretch up to 3#. PT added to HEP (see below) with verbalized understanding from pt and handout supplied  Therapeutic Activity 2 steps up curb with no AD or UE support, pt self-recovered minor loss of balance. PT recommended only performing this activity with rail for safety.  11 stairs descending LLE stance with LUE handrail and RUE wall, verbal cueing for hands forward to allow anterior weight shift  and ascending 5 steps LLE then 6 steps alternating with RUE handrail and LUE wall. PT educated to continue stairs for exercise at home when handrail available and to hold handrail in front of body, descend step-to pattern using LLE & ascend alternating. Pt verbalized understanding  Self-Care PT demo and verbal explanation of scar mobilization (lateral, vertical, circles) with pt verbalized understanding and returned demo PT demo & verbal cues on using pillows for positioning in bed to "tent" or lift sheets off feet and pillow bw knees if sidelying. PT explained rationale to improve comfort with sleep. Pt verbalized understanding.   Modalities: vasopnuematic X 10 min to left medium compression 34 degrees While on vaso, PT educating and answering questions about exercise, swimming, and self-care (above). Pt verbalized understanding & appreciation for PT answering her questions.    06/28/22: Therapeutic Exercise: Recumbent bike seat 3 x 6 minutes, partial to full revolutions Slant board x 3 holding 20 seconds Leg Curl machine: Left LE only 15# 2 x 10 Leg Extension: bil LE's lifting 5#, lowering c Left LE 2 x 10 Leg press : double leg 81# 3 x 10, Lt leg 43# 3 x 10  Step up 6 inch with no UE support x 15  Neuro Re-ed Standing on Airex: tapping 2 cones placed at each corner of the mat x 20 c single UE support and then cones placed directly in front of pt to elicit more hip flexion  Diagonal Stepping toward 3 cones placed laterally Modalities: vasopnuematic X 10 min to left medium compression 34 degrees      PATIENT EDUCATION:  Education details: PT POC, HEP Person educated: Patient Education method: Consulting civil engineer, Demonstration, Corporate treasurer cues, Verbal cues, and Handouts Education comprehension: verbalized understanding, returned demonstration, and needs further education     HOME EXERCISE PROGRAM: Access Code: [email or web address] URL:  [email or web address] Date: 06/30/2022 Prepared  by: Jamey Reas  Exercises - Supine Straight Leg Raises  - 3 x daily - 7 x weekly - 2 sets - 10 reps - Supine Heel Slide with Strap  - 3 x daily - 7 x weekly - 2 sets - 10 reps - Long Sitting Quad Set with Towel Roll Under Heel  - 3 x daily - 7 x weekly - 2 sets - 10 reps - 5 seconds hold - Supine Single Leg Hip Flexion with Knee Bent  - 3 x daily - 7 x weekly - 1 sets - 3 reps - 30-60 seconds hold - Seated Long Arc Quad  - 3 x daily - 7 x weekly - 2 sets - 10 reps - 3 seconds hold - Seated Knee Flexion AAROM  - 3 x daily - 7 x weekly - 10 reps - 10 seconds hold - Standing March with Counter Support  - 3 x daily - 7 x weekly - 2 sets - 10 reps - 5 seconds hold - Standing Knee Flexion AROM with Chair Support  - 3 x daily - 7 x weekly - 2 sets - 10 reps - 5 seconds hold  Patient Education - Scar Massage   ASSESSMENT:   CLINICAL IMPRESSION: Pt tolerating exercises well. Pt reporting more stiffness than pain. Pt feels like she is still limited with walking distances and prolonged standing. Pt's left knee AROM flexion is 115 degrees. Pt's 5 time sit to stand has improved to 8 seconds with no UE support. Continue skilled PT to maximize pt's function.     OBJECTIVE IMPAIRMENTS Abnormal gait, decreased activity tolerance, decreased balance, decreased mobility, difficulty walking, decreased ROM, decreased strength, increased edema, impaired flexibility, and pain.    ACTIVITY LIMITATIONS lifting, bending, sitting, standing, sleeping, stairs, transfers, and dressing   PARTICIPATION LIMITATIONS: cleaning, driving, and shopping   PERSONAL FACTORS   HTN, MI, A-fib, CAD, L lumbar laminectomy, atherosclerosis, angina, HTN,  are also affecting patient's functional outcome.    REHAB POTENTIAL: Good   CLINICAL DECISION MAKING: Stable/uncomplicated   EVALUATION COMPLEXITY: Low     GOALS: Goals reviewed with patient? Yes   SHORT TERM GOALS: Target date: 07/06/2022  Patient will demonstrate  independent use of initial home exercise program to maintain progress from in clinic treatments. Goal status: MET 06/14/22   2. Pt will be able to navigate curb step with st cane modified independently.                    A. Goal Status: MET 06/14/22        Long term PT goals (target dates for all long term goals are 8 weeks  07/30/2022 ) Patient will demonstrate/report pain at worst less than or equal to 2/10 with ADL's.  Goal status: MET 07/12/22   Patient will demonstrate independent use of home exercise program to facilitate ability to maintain/progress functional gains from skilled physical therapy services. Goal status: On-going 07/12/22   Patient will demonstrate FOTO outcome > or = 51 % to indicate reduced disability due to condition. Goal status: MET 06/21/22   Pt will be able to amb c no device community surfaces with normalize gait pattern.  Goal status: On-going 07/12/22       5.  Pt will be able to navigate 1 flight of stairs with single hand rail with step  over step pattern.   Goal status: On-going 07/12/22   6. Pt will improve Left knee flexion to >/=  120 degrees to perform functional mobility.      A. Goal Status: On-going 07/12/22     PLAN: PT FREQUENCY:  2-3 x/ week    PT DURATION: 8 weeks   PLANNED INTERVENTIONS: Therapeutic exercises, Therapeutic activity, Neuromuscular re-education, Balance training, Gait training, Patient/Family education, Joint mobilization, Stair training, Electrical stimulation, Cryotherapy, Moist heat, Taping, Vasopneumatic device, and Manual therapy   PLAN FOR NEXT SESSION: Continue to progress strengthening and functional mobiltiy, AROM,  balance and community surfaces compliance, take pt on clinic stairs next visit   [facility] [address] [place], [place], [address] Phone: [phone]   Fax:  [phone]

## 2022-07-14 ENCOUNTER — Ambulatory Visit (INDEPENDENT_AMBULATORY_CARE_PROVIDER_SITE_OTHER): Payer: Medicare Other | Admitting: Physical Therapy

## 2022-07-14 ENCOUNTER — Encounter: Payer: Self-pay | Admitting: Physical Therapy

## 2022-07-14 DIAGNOSIS — M6281 Muscle weakness (generalized): Secondary | ICD-10-CM | POA: Diagnosis not present

## 2022-07-14 DIAGNOSIS — R262 Difficulty in walking, not elsewhere classified: Secondary | ICD-10-CM

## 2022-07-14 DIAGNOSIS — M25562 Pain in left knee: Secondary | ICD-10-CM | POA: Diagnosis not present

## 2022-07-14 DIAGNOSIS — R600 Localized edema: Secondary | ICD-10-CM

## 2022-07-14 DIAGNOSIS — M25662 Stiffness of left knee, not elsewhere classified: Secondary | ICD-10-CM

## 2022-07-14 NOTE — Therapy (Signed)
[facility] [address] [place], [place], [address] Phone: [phone]   Fax:  [phone]  Patient Details  Name: Erica Frank MRN: [id number] Date of Birth: 1941-01-06 Referring Provider:  No ref. provider found  Encounter Date: 07/14/2022    OUTPATIENT PHYSICAL THERAPY TREATMENT NOTE    Patient Name: Erica Frank MRN: [id number] DOB:06-Feb-1941, 81 y.o., female Today's Date: 07/14/2022  PCP: Prince Solian, MD REFERRING PROVIDER: Mcarthur Rossetti, MD      END OF SESSION:   PT End of Session - 07/14/22 1056     Visit Number 14    Number of Visits 24    Date for PT Re-Evaluation 07/30/22    Authorization Type [employer] medicare, PN sent at visit 9    Progress Note Due on Visit 5    PT Start Time 1010    PT Stop Time 1100    PT Time Calculation (min) 50 min    Activity Tolerance Patient tolerated treatment well    Behavior During Therapy [facility] for tasks assessed/performed               Past Medical History:  Diagnosis Date   Anginal pain ([place]) 1989   Anxiety    Arthritis    Atherosclerosis    Atrial fibrillation ([place])    Coronary artery disease    Dysrhythmia    a-fib   Elevated liver enzymes    GERD (gastroesophageal reflux disease)    Heart attack ([place]) 2020   Heart disease    High cholesterol    Hypertension    NSTEMI (non-ST elevated myocardial infarction) ([place]) 07/09/2019   Past Surgical History:  Procedure Laterality Date   ABDOMINAL HYSTERECTOMY  1983   for endometriosis with [place]   ANGIOPLASTY     ANGIOPLASTY     BACK SURGERY     1990   CARDIAC SURGERY     Catheterization   CORONARY STENT INTERVENTION N/A 07/09/2019   Procedure: CORONARY STENT INTERVENTION;  Surgeon: Adrian Prows, MD;  Location: [place] [facility];  Service: Cardiovascular;  Laterality: N/A;   LEFT HEART CATH AND CORONARY ANGIOGRAPHY N/A 07/09/2019   Procedure: LEFT HEART CATH AND CORONARY ANGIOGRAPHY and possible  intervention;  Surgeon: Adrian Prows, MD;  Location: [place] [facility];  Service: Cardiovascular;  Laterality: N/A;  4: or 4:30 PM today   LUMBAR LAMINECTOMY/DECOMPRESSION MICRODISCECTOMY Left 02/02/2022   Procedure: Left Lumbar two-three, Lumbar five-Sacral one Sublaminar decompression;  Surgeon: Kristeen Miss, MD;  Location: [place];  Service: Neurosurgery;  Laterality: Left;   NECK SURGERY     TOTAL KNEE ARTHROPLASTY Left 10/07/2020   Procedure: TOTAL KNEE ARTHROPLASTY;  Surgeon: Paralee Cancel, MD;  Location: [facility];  Service: Orthopedics;  Laterality: Left;  70 mins   TOTAL KNEE REVISION Left 05/13/2022   Procedure: SYNOVECTOMY AND LEFT KNEE POLY-LINER EXCHANGE;  Surgeon: Mcarthur Rossetti, MD;  Location: [place];  Service: Orthopedics;  Laterality: Left;   Patient Active Problem List   Diagnosis Date Noted   Status post revision of total replacement of left knee 05/13/2022   Degenerative lumbar spinal stenosis 02/02/2022   S/P left TKA 10/07/2020   Left knee OA 10/07/2020   Syncope 01/16/2020   Anemia 01/16/2020   Thrombocytopenia ([employer]) 01/16/2020   Paroxysmal atrial fibrillation ([place]) 02/15/2019   Mixed hyperlipidemia 02/15/2019   Bradycardia 02/15/2019   Coronary artery disease involving native coronary artery of native heart with unstable angina pectoris ([place]) 12/20/2018   Dyslipidemia 12/20/2018  Essential hypertension 12/20/2018    REFERRING DIAG: G53.646 (ICD-10-CM) - Status post revision of total knee, left  ONSET DATE: 05/13/2022  THERAPY DIAG:  Acute pain of left knee  Stiffness of left knee, not elsewhere classified  Muscle weakness (generalized)  Localized edema  Difficulty in walking, not elsewhere classified  Rationale for Evaluation and Treatment Rehabilitation  PERTINENT HISTORY: HTN, MI, A-fib, CAD, L lumbar laminectomy, atherosclerosis, angina, HTN  PRECAUTIONS: None  SUBJECTIVE: Pt reporting taking a tramadol prior to therapy. Pt reporting more  stiffness than pain today.   PAIN:  NPRS scale: 1/10 today more stiffness again today Pain location: left knee more in front/medial Pain description: achy & burning, tightness Aggravating factors: exercising bending knee Relieving factors: laying down, meds & ice  OBJECTIVE: (objective measures completed at initial evaluation unless otherwise dated)    PATIENT SURVEYS:  05/31/22: FOTO 36% (predicted 51%)     06/21/22: FOTO 59%   07/12/22: FOTO  43%                   SENSATION: 06/01/2022:  WFL   EDEMA:  06/01/2022:  Circumferential: Rt knee : 37.5 centimeters 06/01/2022:  Circumferential: Left knee: 39 centimeters   MUSCLE LENGTH: 06/01/2022:  Hamstrings: Right 86 deg; Left 75 deg  POSTURE: 06/01/2022:  rounded shoulders and forward head  PALPATION: 06/01/2022:  Tender on medial and lateral joint line with more pain reported on medial side  LOWER EXTREMITY ROM:  Active ROM Right Eval 06/01/22 Left Eval 06/01/22 06/07/22 06/09/2022 06/14/22  06/21/22 06/23/22  06/28/22 07/12/22  Hip flexion             Hip extension             Hip abduction             Hip adduction             Knee flexion 125 80 90 AAROM  Supine AROM heel slide: 97 Supine Active 96 Supine Active 106 Supine  Active 108 Supine  Active 110 Supine Acitve 115  Knee extension 0 -4 -4  -4 -4 -5 -4 -3   (Blank rows = not tested)   Passive ROM Left Eval 06/01/22 Left 06/14/22 supine Left 06/16/22 supine Left 06/21/22 supine  Hip flexion       Hip extension       Hip abduction       Hip adduction       Knee flexion 88 100 110 110  Knee extension -2 -2 -2 -2       LOWER EXTREMITY MMT:  MMT Right Eval 06/01/22 Left Eval 06/01/22 Left 07/14/22  Hip flexion 5/5 4/5 4+/5  Hip extension       Hip abduction 4/5 4/5 4+/5  Hip adduction 4/5 4/5 4+/5  Hip internal rotation       Hip external rotation       Knee flexion 5/5 2/5 4/5  Knee extension 5/5 2/5 4/5  Ankle dorsiflexion 5/5 5/5   Ankle  plantarflexion 5/5 5/5   Ankle inversion       Ankle eversion        (Blank rows = not tested)       FUNCTIONAL TESTS:  06/01/2022:  5 times sit to stand: 15 seconds with UE support 07/12/22: 5 time sit to stand: 8 seconds with no UE support   GAIT: 06/01/2022:  Distance walked: 50 feet  Assistive device utilized: Environmental consultant - 2 wheeled Level of assistance:  Modified independence Comments: antalgic gait pattern, decreased left knee flexion and hip flexion     TODAY'S TREATMENT: 07/14/22: Therapeutic Exercise: Nustep x 8 minutes Level 5 (508 steps) Slant board x 3 holding 20 seconds Leg Curl machine: Left LE only 15# 2 x 10 Leg Extension: bil LE's lifting 10#, lowering c Left LE 2 x 10 Leg press : double leg 87# 2 x 15, Lt leg 50# 2 x 15  Step up 4  inch with no UE support x 15  Neuro Re-ed Stepping over cones x 6 with close supervision Lateral stepping over 6 cones with close supervision to CGA for 2 LOB Rocker board x 2 minutes Heel tapping on 4 inch step x 20 alternating each LE Walking in figure 8 pattern around 3 cones  Modalities: vasopnuematic X 10 min to left medium compression 34 degrees   07/12/22: Therapeutic Exercise: Nustep x 8 minutes Level 5 Slant board x 3 holding 20 seconds Leg Curl machine: Left LE only 15# x 15 Leg Extension: bil LE's lifting 5#, lowering c Left LE 2 x 10 Leg press : double leg 87# 2 x 15, Lt leg 50# 2 x 15  Step up 4  inch with no UE support x 15  Neuro Re-ed Side stepping 20 feet x 2  Rocker board x 2 minutes Tandem stance each LE in front x 30 seconds Modalities: vasopnuematic X 10 min to left medium compression 34 degrees    06/30/22: Therapeutic Exercise: Recumbent bike seat 3 x 8 minutes, 1 minute partial revolutions. PT educated on seat distance for biking at home and length of exercise, pt verbalized understanding PT had pt perform HEP to assess what may be causing increase in knee pain noted in subjective;  review HEP with x 10  reps each exercise and pt demo some other exercises that she has been doing at home;  pt required some correctional cues for exercises. PT recommended not using weight for flexion stretch supine with femur vertical to assess if this decreases pain.  If no difference than add wt back in stretch up to 3#. PT added to HEP (see below) with verbalized understanding from pt and handout supplied  Therapeutic Activity 2 steps up curb with no AD or UE support, pt self-recovered minor loss of balance. PT recommended only performing this activity with rail for safety.  11 stairs descending LLE stance with LUE handrail and RUE wall, verbal cueing for hands forward to allow anterior weight shift and ascending 5 steps LLE then 6 steps alternating with RUE handrail and LUE wall. PT educated to continue stairs for exercise at home when handrail available and to hold handrail in front of body, descend step-to pattern using LLE & ascend alternating. Pt verbalized understanding  Self-Care PT demo and verbal explanation of scar mobilization (lateral, vertical, circles) with pt verbalized understanding and returned demo PT demo & verbal cues on using pillows for positioning in bed to "tent" or lift sheets off feet and pillow bw knees if sidelying. PT explained rationale to improve comfort with sleep. Pt verbalized understanding.   Modalities: vasopnuematic X 10 min to left medium compression 34 degrees While on vaso, PT educating and answering questions about exercise, swimming, and self-care (above). Pt verbalized understanding & appreciation for PT answering her questions.         PATIENT EDUCATION:  Education details: PT POC, HEP Person educated: Patient Education method: Explanation, Demonstration, Tactile cues, Verbal cues, and Handouts Education comprehension: verbalized understanding, returned  demonstration, and needs further education     HOME EXERCISE PROGRAM: Access Code: [email or web address] URL:  [email or web address] Date: 06/30/2022 Prepared by: Jamey Reas  Exercises - Supine Straight Leg Raises  - 3 x daily - 7 x weekly - 2 sets - 10 reps - Supine Heel Slide with Strap  - 3 x daily - 7 x weekly - 2 sets - 10 reps - Long Sitting Quad Set with Towel Roll Under Heel  - 3 x daily - 7 x weekly - 2 sets - 10 reps - 5 seconds hold - Supine Single Leg Hip Flexion with Knee Bent  - 3 x daily - 7 x weekly - 1 sets - 3 reps - 30-60 seconds hold - Seated Long Arc Quad  - 3 x daily - 7 x weekly - 2 sets - 10 reps - 3 seconds hold - Seated Knee Flexion AAROM  - 3 x daily - 7 x weekly - 10 reps - 10 seconds hold - Standing March with Counter Support  - 3 x daily - 7 x weekly - 2 sets - 10 reps - 5 seconds hold - Standing Knee Flexion AROM with Chair Support  - 3 x daily - 7 x weekly - 2 sets - 10 reps - 5 seconds hold  Patient Education - Scar Massage   ASSESSMENT:   CLINICAL IMPRESSION: Pt progressing with LE strengthening by increasing her weights and endurance. Pt still requiring CGA for higher level balance exercises. Continue skilled PT to maximize pt's function.        OBJECTIVE IMPAIRMENTS Abnormal gait, decreased activity tolerance, decreased balance, decreased mobility, difficulty walking, decreased ROM, decreased strength, increased edema, impaired flexibility, and pain.    ACTIVITY LIMITATIONS lifting, bending, sitting, standing, sleeping, stairs, transfers, and dressing   PARTICIPATION LIMITATIONS: cleaning, driving, and shopping   PERSONAL FACTORS   HTN, MI, A-fib, CAD, L lumbar laminectomy, atherosclerosis, angina, HTN,  are also affecting patient's functional outcome.    REHAB POTENTIAL: Good   CLINICAL DECISION MAKING: Stable/uncomplicated   EVALUATION COMPLEXITY: Low     GOALS: Goals reviewed with patient? Yes   SHORT TERM GOALS: Target date: 07/06/2022  Patient will demonstrate independent use of initial home exercise program to maintain  progress from in clinic treatments. Goal status: MET 06/14/22   2. Pt will be able to navigate curb step with st cane modified independently.                    A. Goal Status: MET 06/14/22        Long term PT goals (target dates for all long term goals are 8 weeks  07/30/2022 ) Patient will demonstrate/report pain at worst less than or equal to 2/10 with ADL's.  Goal status: MET 07/12/22   Patient will demonstrate independent use of home exercise program to facilitate ability to maintain/progress functional gains from skilled physical therapy services. Goal status: On-going 07/12/22   Patient will demonstrate FOTO outcome > or = 51 % to indicate reduced disability due to condition. Goal status: MET 06/21/22   Pt will be able to amb c no device community surfaces with normalize gait pattern.  Goal status: On-going 07/12/22       5.  Pt will be able to navigate 1 flight of stairs with single hand rail with step  over step pattern.   Goal status: On-going 07/12/22   6. Pt will improve Left knee flexion to >/= 120 degrees to perform functional mobility.  A. Goal Status: On-going 07/12/22     PLAN: PT FREQUENCY:  2-3 x/ week    PT DURATION: 8 weeks   PLANNED INTERVENTIONS: Therapeutic exercises, Therapeutic activity, Neuromuscular re-education, Balance training, Gait training, Patient/Family education, Joint mobilization, Stair training, Electrical stimulation, Cryotherapy, Moist heat, Taping, Vasopneumatic device, and Manual therapy   PLAN FOR NEXT SESSION: Continue to progress strengthening and functional mobiltiy, AROM,  balance and community surfaces compliance, take pt on clinic stairs next visit.    [facility] [address] [place], [place], [address] Phone: [phone]   Fax:  [phone]

## 2022-07-19 ENCOUNTER — Ambulatory Visit: Payer: Self-pay

## 2022-07-19 ENCOUNTER — Ambulatory Visit: Payer: Medicare Other | Admitting: Orthopedic Surgery

## 2022-07-19 ENCOUNTER — Encounter: Payer: Self-pay | Admitting: Physical Therapy

## 2022-07-19 ENCOUNTER — Ambulatory Visit: Payer: Medicare Other | Admitting: Physical Therapy

## 2022-07-19 ENCOUNTER — Ambulatory Visit (INDEPENDENT_AMBULATORY_CARE_PROVIDER_SITE_OTHER): Payer: Medicare Other

## 2022-07-19 DIAGNOSIS — M25562 Pain in left knee: Secondary | ICD-10-CM

## 2022-07-19 DIAGNOSIS — M6281 Muscle weakness (generalized): Secondary | ICD-10-CM | POA: Diagnosis not present

## 2022-07-19 DIAGNOSIS — M7541 Impingement syndrome of right shoulder: Secondary | ICD-10-CM

## 2022-07-19 DIAGNOSIS — M25512 Pain in left shoulder: Secondary | ICD-10-CM | POA: Diagnosis not present

## 2022-07-19 DIAGNOSIS — M25662 Stiffness of left knee, not elsewhere classified: Secondary | ICD-10-CM | POA: Diagnosis not present

## 2022-07-19 DIAGNOSIS — M25511 Pain in right shoulder: Secondary | ICD-10-CM | POA: Diagnosis not present

## 2022-07-19 DIAGNOSIS — R600 Localized edema: Secondary | ICD-10-CM | POA: Diagnosis not present

## 2022-07-19 DIAGNOSIS — R262 Difficulty in walking, not elsewhere classified: Secondary | ICD-10-CM

## 2022-07-19 NOTE — Therapy (Signed)
[facility] [address] [place], [place], [address] Phone: [phone]   Fax:  [phone]  Patient Details  Name: Erica Frank MRN: [id number] Date of Birth: 1941/06/10 Referring Provider:  No ref. provider found  Encounter Date: 07/19/2022    OUTPATIENT PHYSICAL THERAPY TREATMENT NOTE    Patient Name: Erica Frank MRN: [id number] DOB:Jul 11, 1941, 81 y.o., female Today's Date: 07/19/2022  PCP: Prince Solian, MD REFERRING PROVIDER: Mcarthur Rossetti, MD      END OF SESSION:   PT End of Session - 07/19/22 1026     Visit Number 15    Number of Visits 24    Date for PT Re-Evaluation 07/30/22    Authorization Type [employer] medicare, PN sent at visit 9    Progress Note Due on Visit 19    PT Start Time 1016    PT Stop Time 1100    PT Time Calculation (min) 44 min    Activity Tolerance Patient tolerated treatment well    Behavior During Therapy WFL for tasks assessed/performed               Past Medical History:  Diagnosis Date   Anginal pain ([place]) 1989   Anxiety    Arthritis    Atherosclerosis    Atrial fibrillation ([place])    Coronary artery disease    Dysrhythmia    a-fib   Elevated liver enzymes    GERD (gastroesophageal reflux disease)    Heart attack ([place]) 2020   Heart disease    High cholesterol    Hypertension    NSTEMI (non-ST elevated myocardial infarction) ([place]) 07/09/2019   Past Surgical History:  Procedure Laterality Date   ABDOMINAL HYSTERECTOMY  1983   for endometriosis with Burch   [place]     Catheterization   CORONARY STENT INTERVENTION N/A 07/09/2019   Procedure: CORONARY STENT INTERVENTION;  Surgeon: Adrian Prows, MD;  Location: [place] [facility];  Service: Cardiovascular;  Laterality: N/A;   LEFT HEART CATH AND CORONARY ANGIOGRAPHY N/A 07/09/2019   Procedure: LEFT HEART CATH AND CORONARY ANGIOGRAPHY and possible  intervention;  Surgeon: Adrian Prows, MD;  Location: [place] [facility];  Service: Cardiovascular;  Laterality: N/A;  4: or 4:30 PM today   LUMBAR LAMINECTOMY/DECOMPRESSION MICRODISCECTOMY Left 02/02/2022   Procedure: Left Lumbar two-three, Lumbar five-Sacral one Sublaminar decompression;  Surgeon: Kristeen Miss, MD;  Location: [place];  Service: Neurosurgery;  Laterality: Left;   NECK SURGERY     TOTAL KNEE ARTHROPLASTY Left 10/07/2020   Procedure: TOTAL KNEE ARTHROPLASTY;  Surgeon: Paralee Cancel, MD;  [facility];  Service: Orthopedics;  Laterality: Left;  70 mins   TOTAL KNEE REVISION Left 05/13/2022   Procedure: SYNOVECTOMY AND LEFT KNEE POLY-LINER EXCHANGE;  Surgeon: Mcarthur Rossetti, MD;  Location: [place];  Service: Orthopedics;  Laterality: Left;   Patient Active Problem List   Diagnosis Date Noted   Status post revision of total replacement of left knee 05/13/2022   Degenerative lumbar spinal stenosis 02/02/2022   S/P left TKA 10/07/2020   Left knee OA 10/07/2020   Syncope 01/16/2020   Anemia 01/16/2020   Thrombocytopenia ([place]) 01/16/2020   Paroxysmal atrial fibrillation ([place]) 02/15/2019   Mixed hyperlipidemia 02/15/2019   Bradycardia 02/15/2019   Coronary artery disease involving native coronary artery of native heart with unstable angina pectoris ([place]) 12/20/2018   Dyslipidemia 12/20/2018  Essential hypertension 12/20/2018    REFERRING DIAG: V74.827 (ICD-10-CM) - Status post revision of total knee, left  ONSET DATE: 05/13/2022  THERAPY DIAG:  Acute pain of left knee  Stiffness of left knee, not elsewhere classified  Muscle weakness (generalized)  Localized edema  Difficulty in walking, not elsewhere classified  Rationale for Evaluation and Treatment Rehabilitation  PERTINENT HISTORY: HTN, MI, A-fib, CAD, L lumbar laminectomy, atherosclerosis, angina, HTN  PRECAUTIONS: None  SUBJECTIVE: Pt reporting not really pain, more like stiffness in her knee  PAIN:   NPRS scale: 1/10 today more stiffness again today Pain location: left knee more in front/medial Pain description: achy & burning, tightness Aggravating factors: exercising bending knee Relieving factors: laying down, meds & ice  OBJECTIVE: (objective measures completed at initial evaluation unless otherwise dated)    PATIENT SURVEYS:  05/31/22: FOTO 36% (predicted 51%)     06/21/22: FOTO 59%   07/12/22: FOTO  43%                   SENSATION: 06/01/2022:  WFL   EDEMA:  06/01/2022:  Circumferential: Rt knee : 37.5 centimeters 06/01/2022:  Circumferential: Left knee: 39 centimeters   MUSCLE LENGTH: 06/01/2022:  Hamstrings: Right 86 deg; Left 75 deg  POSTURE: 06/01/2022:  rounded shoulders and forward head  PALPATION: 06/01/2022:  Tender on medial and lateral joint line with more pain reported on medial side  LOWER EXTREMITY ROM:  Active ROM Right Eval 06/01/22 Left Eval 06/01/22 06/07/22 06/09/2022 06/14/22  06/21/22 06/23/22  06/28/22 07/12/22  Hip flexion             Hip extension             Hip abduction             Hip adduction             Knee flexion 125 80 90 AAROM  Supine AROM heel slide: 97 Supine Active 96 Supine Active 106 Supine  Active 108 Supine  Active 110 Supine Acitve 115  Knee extension 0 -4 -4  -4 -4 -5 -4 -3   (Blank rows = not tested)   Passive ROM Left Eval 06/01/22 Left 06/14/22 supine Left 06/16/22 supine Left 06/21/22 supine  Hip flexion       Hip extension       Hip abduction       Hip adduction       Knee flexion 88 100 110 110  Knee extension -2 -2 -2 -2       LOWER EXTREMITY MMT:  MMT Right Eval 06/01/22 Left Eval 06/01/22 Left 07/14/22  Hip flexion 5/5 4/5 4+/5  Hip extension       Hip abduction 4/5 4/5 4+/5  Hip adduction 4/5 4/5 4+/5  Hip internal rotation       Hip external rotation       Knee flexion 5/5 2/5 4/5  Knee extension 5/5 2/5 4/5  Ankle dorsiflexion 5/5 5/5   Ankle plantarflexion 5/5 5/5   Ankle inversion        Ankle eversion        (Blank rows = not tested)       FUNCTIONAL TESTS:  06/01/2022:  5 times sit to stand: 15 seconds with UE support 07/12/22: 5 time sit to stand: 8 seconds with no UE support   GAIT: 06/01/2022:  Distance walked: 50 feet  Assistive device utilized: Environmental consultant - 2 wheeled Level of assistance: Modified independence Comments: antalgic gait  pattern, decreased left knee flexion and hip flexion     TODAY'S TREATMENT: 07/19/22: Therapeutic Exercise: Nustep x 8 minutes Level 7 UE/LE Slant board x 3 holding 30 seconds Leg Curl machine: Left LE only 15# 2 x 12 Leg Extension: bil LE's lifting 10#, lowering c Left LE 2 x 12 Leg press : double leg 87# 2 x 15, Lt leg 50# 2 x 15  Stairs in hall reciprocal X 2 flights, using bilat handrails. Seated knee flexion AAROM stretch 5 sec X 15 for left Neuro Re-ed Heel tapping on 6 inch step x 20 alternating each LE Tandem walk 3 round trips at counter top without UE support March walk 3 round trips at counter top without UE support  Modalities: vasopnuematic X 10 min to left medium compression 34 degrees  07/14/22: Therapeutic Exercise: Nustep x 8 minutes Level 5 (508 steps) Slant board x 3 holding 20 seconds Leg Curl machine: Left LE only 15# 2 x 10 Leg Extension: bil LE's lifting 10#, lowering c Left LE 2 x 10 Leg press : double leg 87# 2 x 15, Lt leg 50# 2 x 15  Step up 4  inch with no UE support x 15  Neuro Re-ed Stepping over cones x 6 with close supervision Lateral stepping over 6 cones with close supervision to CGA for 2 LOB Rocker board x 2 minutes Heel tapping on 4 inch step x 20 alternating each LE Walking in figure 8 pattern around 3 cones  Modalities: vasopnuematic X 10 min to left medium compression 34 degrees   PATIENT EDUCATION:  Education details: PT POC, HEP Person educated: Patient Education method: Consulting civil engineer, Demonstration, Corporate treasurer cues, Verbal cues, and Handouts Education comprehension: verbalized  understanding, returned demonstration, and needs further education     HOME EXERCISE PROGRAM: Access Code: [email or web address] URL: [email or web address] Date: 06/30/2022 Prepared by: Jamey Reas  Exercises - Supine Straight Leg Raises  - 3 x daily - 7 x weekly - 2 sets - 10 reps - Supine Heel Slide with Strap  - 3 x daily - 7 x weekly - 2 sets - 10 reps - Long Sitting Quad Set with Towel Roll Under Heel  - 3 x daily - 7 x weekly - 2 sets - 10 reps - 5 seconds hold - Supine Single Leg Hip Flexion with Knee Bent  - 3 x daily - 7 x weekly - 1 sets - 3 reps - 30-60 seconds hold - Seated Long Arc Quad  - 3 x daily - 7 x weekly - 2 sets - 10 reps - 3 seconds hold - Seated Knee Flexion AAROM  - 3 x daily - 7 x weekly - 10 reps - 10 seconds hold - Standing March with Counter Support  - 3 x daily - 7 x weekly - 2 sets - 10 reps - 5 seconds hold - Standing Knee Flexion AROM with Chair Support  - 3 x daily - 7 x weekly - 2 sets - 10 reps - 5 seconds hold  Patient Education - Scar Massage   ASSESSMENT:   CLINICAL IMPRESSION: She is able to perform stairs reciprocally now, but does need UE support from hand rails. We will continue to work to improve overall balance, Left knee strength and ROM as tolerated.    OBJECTIVE IMPAIRMENTS Abnormal gait, decreased activity tolerance, decreased balance, decreased mobility, difficulty walking, decreased ROM, decreased strength, increased edema, impaired flexibility, and pain.    ACTIVITY LIMITATIONS lifting, bending, sitting, standing, sleeping, stairs, transfers, and  dressing   PARTICIPATION LIMITATIONS: cleaning, driving, and shopping   PERSONAL FACTORS   HTN, MI, A-fib, CAD, L lumbar laminectomy, atherosclerosis, angina, HTN,  are also affecting patient's functional outcome.    REHAB POTENTIAL: Good   CLINICAL DECISION MAKING: Stable/uncomplicated   EVALUATION COMPLEXITY: Low     GOALS: Goals reviewed with patient? Yes   SHORT TERM  GOALS: Target date: 07/06/2022  Patient will demonstrate independent use of initial home exercise program to maintain progress from in clinic treatments. Goal status: MET 06/14/22   2. Pt will be able to navigate curb step with st cane modified independently.                    A. Goal Status: MET 06/14/22        Long term PT goals (target dates for all long term goals are 8 weeks  07/30/2022 ) Patient will demonstrate/report pain at worst less than or equal to 2/10 with ADL's.  Goal status: MET 07/12/22   Patient will demonstrate independent use of home exercise program to facilitate ability to maintain/progress functional gains from skilled physical therapy services. Goal status: On-going 07/12/22   Patient will demonstrate FOTO outcome > or = 51 % to indicate reduced disability due to condition. Goal status: MET 06/21/22   Pt will be able to amb c no device community surfaces with normalize gait pattern.  Goal status: On-going 07/12/22       5.  Pt will be able to navigate 1 flight of stairs with single hand rail with step  over step pattern.   Goal status: On-going 07/12/22   6. Pt will improve Left knee flexion to >/= 120 degrees to perform functional mobility.      A. Goal Status: On-going 07/12/22     PLAN: PT FREQUENCY:  2-3 x/ week    PT DURATION: 8 weeks   PLANNED INTERVENTIONS: Therapeutic exercises, Therapeutic activity, Neuromuscular re-education, Balance training, Gait training, Patient/Family education, Joint mobilization, Stair training, Electrical stimulation, Cryotherapy, Moist heat, Taping, Vasopneumatic device, and Manual therapy   PLAN FOR NEXT SESSION: Continue to progress strengthening and functional mobiltiy, AROM,  balance and community surfaces compliance   [facility] [address] [place], [place], [address] Phone: [phone]   Fax:  [phone]

## 2022-07-21 ENCOUNTER — Ambulatory Visit (INDEPENDENT_AMBULATORY_CARE_PROVIDER_SITE_OTHER): Payer: Medicare Other | Admitting: Physical Therapy

## 2022-07-21 ENCOUNTER — Encounter: Payer: Self-pay | Admitting: Physical Therapy

## 2022-07-21 DIAGNOSIS — M6281 Muscle weakness (generalized): Secondary | ICD-10-CM

## 2022-07-21 DIAGNOSIS — M25562 Pain in left knee: Secondary | ICD-10-CM | POA: Diagnosis not present

## 2022-07-21 DIAGNOSIS — R600 Localized edema: Secondary | ICD-10-CM

## 2022-07-21 DIAGNOSIS — M25662 Stiffness of left knee, not elsewhere classified: Secondary | ICD-10-CM | POA: Diagnosis not present

## 2022-07-21 DIAGNOSIS — R262 Difficulty in walking, not elsewhere classified: Secondary | ICD-10-CM

## 2022-07-21 NOTE — Therapy (Signed)
[facility] [address] [place], [place], [address] Phone: [phone]   Fax:  [phone]  Patient Details  Name: Erica Frank MRN: [id number] Date of Birth: 1941-02-07 Referring Provider:  No ref. provider found  Encounter Date: 07/21/2022    OUTPATIENT PHYSICAL THERAPY TREATMENT NOTE    Patient Name: Erica Frank MRN: [id number] DOB:1941-02-25, 81 y.o., female Today's Date: 07/21/2022  PCP: Prince Solian, MD REFERRING PROVIDER: Mcarthur Rossetti, MD      END OF SESSION:   PT End of Session - 07/21/22 1023     Visit Number 16    Number of Visits 24    Date for PT Re-Evaluation 07/30/22    Authorization Type [employer]HC medicare, PN sent at visit 9    Progress Note Due on Visit 81    PT Start Time 1017    PT Stop Time 1110    PT Time Calculation (min) 53 min    Activity Tolerance Patient tolerated treatment well    Behavior During Therapy [facility] for tasks assessed/performed                Past Medical History:  Diagnosis Date   Anginal pain ([place]) 1989   Anxiety    Arthritis    Atherosclerosis    Atrial fibrillation ([place])    Coronary artery disease    Dysrhythmia    a-fib   Elevated liver enzymes    GERD (gastroesophageal reflux disease)    Heart attack ([place]) 2020   Heart disease    High cholesterol    Hypertension    NSTEMI (non-ST elevated myocardial infarction) ([place]) 07/09/2019   Past Surgical History:  Procedure Laterality Date   ABDOMINAL HYSTERECTOMY  1983   for endometriosis with Burch   [place]     Catheterization   CORONARY STENT INTERVENTION N/A 07/09/2019   Procedure: CORONARY STENT INTERVENTION;  Surgeon: Adrian Prows, MD;  Location: [place] [facility];  Service: Cardiovascular;  Laterality: N/A;   LEFT HEART CATH AND CORONARY ANGIOGRAPHY N/A 07/09/2019   Procedure: LEFT HEART CATH AND CORONARY ANGIOGRAPHY and possible  intervention;  Surgeon: Adrian Prows, MD;  Location: [place] [facility];  Service: Cardiovascular;  Laterality: N/A;  4: or 4:30 PM today   LUMBAR LAMINECTOMY/DECOMPRESSION MICRODISCECTOMY Left 02/02/2022   Procedure: Left Lumbar two-three, Lumbar five-Sacral one Sublaminar decompression;  Surgeon: Kristeen Miss, MD;  Location: [place];  Service: Neurosurgery;  Laterality: Left;   NECK SURGERY     TOTAL KNEE ARTHROPLASTY Left 10/07/2020   Procedure: TOTAL KNEE ARTHROPLASTY;  Surgeon: Paralee Cancel, MD;  [facility];  Service: Orthopedics;  Laterality: Left;  70 mins   TOTAL KNEE REVISION Left 05/13/2022   Procedure: SYNOVECTOMY AND LEFT KNEE POLY-LINER EXCHANGE;  Surgeon: Mcarthur Rossetti, MD;  Location: [place];  Service: Orthopedics;  Laterality: Left;   Patient Active Problem List   Diagnosis Date Noted   Status post revision of total replacement of left knee 05/13/2022   Degenerative lumbar spinal stenosis 02/02/2022   S/P left TKA 10/07/2020   Left knee OA 10/07/2020   Syncope 01/16/2020   Anemia 01/16/2020   Thrombocytopenia ([place]) 01/16/2020   Paroxysmal atrial fibrillation ([place]) 02/15/2019   Mixed hyperlipidemia 02/15/2019   Bradycardia 02/15/2019   Coronary artery disease involving native coronary artery of native heart with unstable angina pectoris ([place]) 12/20/2018   Dyslipidemia  12/20/2018   Essential hypertension 12/20/2018    REFERRING DIAG: I78.676 (ICD-10-CM) - Status post revision of total knee, left  ONSET DATE: 05/13/2022  THERAPY DIAG:  Acute pain of left knee  Stiffness of left knee, not elsewhere classified  Muscle weakness (generalized)  Localized edema  Difficulty in walking, not elsewhere classified  Rationale for Evaluation and Treatment Rehabilitation  PERTINENT HISTORY: HTN, MI, A-fib, CAD, L lumbar laminectomy, atherosclerosis, angina, HTN  PRECAUTIONS: None  SUBJECTIVE: wants to get off tramadol, isn't having much pain but feels if she  doesn't take it her knee will hurt more throughout the day.   PAIN:  NPRS scale: 1/10 today more stiffness again today Pain location: left knee more in front/medial Pain description: achy & burning, tightness Aggravating factors: exercising bending knee Relieving factors: laying down, meds & ice  OBJECTIVE: (objective measures completed at initial evaluation unless otherwise dated)    PATIENT SURVEYS:  05/31/22: FOTO 36% (predicted 51%)     06/21/22: FOTO 59%   07/12/22: FOTO  43%                   SENSATION: 06/01/2022:  WFL   EDEMA:  06/01/2022:  Circumferential: Rt knee : 37.5 centimeters 06/01/2022:  Circumferential: Left knee: 39 centimeters   MUSCLE LENGTH: 06/01/2022:  Hamstrings: Right 86 deg; Left 75 deg  POSTURE: 06/01/2022:  rounded shoulders and forward head  PALPATION: 06/01/2022:  Tender on medial and lateral joint line with more pain reported on medial side  LOWER EXTREMITY ROM:  Active ROM Right Eval 06/01/22 Left Eval 06/01/22 06/07/22 06/09/2022 06/14/22  06/21/22 06/23/22  06/28/22 07/12/22 07/21/22 Left  Hip flexion              Hip extension              Hip abduction              Hip adduction              Knee flexion 125 80 90 AAROM  Supine AROM heel slide: 97 Supine Active 96 Supine Active 106 Supine  Active 108 Supine  Active 110 Supine Acitve 115 Supine  115  Knee extension 0 -4 -4  -4 -4 -5 -4 -3 -1   (Blank rows = not tested)   Passive ROM Left Eval 06/01/22 Left 06/14/22 supine Left 06/16/22 supine Left 06/21/22 supine Left 07/21/22  Hip flexion        Hip extension        Hip abduction        Hip adduction        Knee flexion 88 100 110 110 120  Knee extension -2 -2 -2 -2        LOWER EXTREMITY MMT:  MMT Right Eval 06/01/22 Left Eval 06/01/22 Left 07/14/22  Hip flexion 5/5 4/5 4+/5  Hip extension       Hip abduction 4/5 4/5 4+/5  Hip adduction 4/5 4/5 4+/5  Hip internal rotation       Hip external rotation       Knee  flexion 5/5 2/5 4/5  Knee extension 5/5 2/5 4/5  Ankle dorsiflexion 5/5 5/5   Ankle plantarflexion 5/5 5/5   Ankle inversion       Ankle eversion        (Blank rows = not tested)       FUNCTIONAL TESTS:  06/01/2022:  5 times sit to stand: 15 seconds with UE support 07/12/22: 5  time sit to stand: 8 seconds with no UE support   GAIT: 06/01/2022:  Distance walked: 50 feet  Assistive device utilized: Environmental consultant - 2 wheeled Level of assistance: Modified independence Comments: antalgic gait pattern, decreased left knee flexion and hip flexion     TODAY'S TREATMENT: 07/21/22 Therapeutic Exercise: Nustep x 8 minutes Level 7 UE/LE Leg Extension: bil LE's lifting 10#, lowering c Left LE 2 x 12 Leg Curl machine: Left LE only 15# 2 x 12 AA heel slides x 15 reps on Lt ROM measurements as noted above   Neuro Re-ed On foam with feet together: EC 5x10 sec; horizontal/vertical head turns with EO x 10 reps, then EC x 10 reps Tandem gait in // bars with 1UE support x 5 laps  Modalities: vasopnuematic X 10 min to left medium compression 34 degrees  07/19/22: Therapeutic Exercise: Nustep x 8 minutes Level 7 UE/LE Slant board x 3 holding 30 seconds Leg Curl machine: Left LE only 15# 2 x 12 Leg Extension: bil LE's lifting 10#, lowering c Left LE 2 x 12 Leg press : double leg 87# 2 x 15, Lt leg 50# 2 x 15  Stairs in hall reciprocal X 2 flights, using bilat handrails. Seated knee flexion AAROM stretch 5 sec X 15 for left Neuro Re-ed Heel tapping on 6 inch step x 20 alternating each LE Tandem walk 3 round trips at counter top without UE support March walk 3 round trips at counter top without UE support  Modalities: vasopnuematic X 10 min to left medium compression 34 degrees  07/14/22: Therapeutic Exercise: Nustep x 8 minutes Level 5 (508 steps) Slant board x 3 holding 20 seconds Leg Curl machine: Left LE only 15# 2 x 10 Leg Extension: bil LE's lifting 10#, lowering c Left LE 2 x 10 Leg press  : double leg 87# 2 x 15, Lt leg 50# 2 x 15  Step up 4  inch with no UE support x 15  Neuro Re-ed Stepping over cones x 6 with close supervision Lateral stepping over 6 cones with close supervision to CGA for 2 LOB Rocker board x 2 minutes Heel tapping on 4 inch step x 20 alternating each LE Walking in figure 8 pattern around 3 cones  Modalities: vasopnuematic X 10 min to left medium compression 34 degrees   PATIENT EDUCATION:  Education details: PT POC, HEP Person educated: Patient Education method: Consulting civil engineer, Demonstration, Corporate treasurer cues, Verbal cues, and Handouts Education comprehension: verbalized understanding, returned demonstration, and needs further education     HOME EXERCISE PROGRAM: Access Code: [email or web address] URL: [email or web address] Date: 07/21/2022 Prepared by: Faustino Congress  Exercises - Supine Straight Leg Raises  - 3 x daily - 7 x weekly - 2 sets - 10 reps - Supine Heel Slide with Strap  - 3 x daily - 7 x weekly - 2 sets - 10 reps - Long Sitting Quad Set with Towel Roll Under Heel  - 3 x daily - 7 x weekly - 2 sets - 10 reps - 5 seconds hold - Supine Single Leg Hip Flexion with Knee Bent  - 3 x daily - 7 x weekly - 1 sets - 3 reps - 30-60 seconds hold - Seated Long Arc Quad  - 3 x daily - 7 x weekly - 2 sets - 10 reps - 3 seconds hold - Seated Knee Flexion AAROM  - 3 x daily - 7 x weekly - 10 reps - 10 seconds hold - Standing March with Counter Support  -  3 x daily - 7 x weekly - 2 sets - 10 reps - 5 seconds hold - Standing Knee Flexion AROM with Chair Support  - 3 x daily - 7 x weekly - 2 sets - 10 reps - 5 seconds hold - Romberg Stance Eyes Closed on Foam Pad  - 2 x daily - 7 x weekly - 1 sets - 5 reps - 10-15 sec hold - Romberg Stance with Head Nods on Foam Pad  - 2 x daily - 7 x weekly - 1 sets - 1 reps - 10 rotations each way  Patient Education - Scar Massage   ASSESSMENT:   CLINICAL IMPRESSION: Updated HEP to include balance exercises,  but forgot to give handouts.  Pt overall doing very well with PT and anticipate she will be ready for d/c next week.  Extension improved today.  OBJECTIVE IMPAIRMENTS Abnormal gait, decreased activity tolerance, decreased balance, decreased mobility, difficulty walking, decreased ROM, decreased strength, increased edema, impaired flexibility, and pain.    ACTIVITY LIMITATIONS lifting, bending, sitting, standing, sleeping, stairs, transfers, and dressing   PARTICIPATION LIMITATIONS: cleaning, driving, and shopping   PERSONAL FACTORS   HTN, MI, A-fib, CAD, L lumbar laminectomy, atherosclerosis, angina, HTN,  are also affecting patient's functional outcome.    REHAB POTENTIAL: Good   CLINICAL DECISION MAKING: Stable/uncomplicated   EVALUATION COMPLEXITY: Low     GOALS: Goals reviewed with patient? Yes   SHORT TERM GOALS: Target date: 07/06/2022  Patient will demonstrate independent use of initial home exercise program to maintain progress from in clinic treatments. Goal status: MET 06/14/22   2. Pt will be able to navigate curb step with st cane modified independently.                    A. Goal Status: MET 06/14/22        Long term PT goals (target dates for all long term goals are 8 weeks  07/30/2022 ) Patient will demonstrate/report pain at worst less than or equal to 2/10 with ADL's.  Goal status: MET 07/12/22   Patient will demonstrate independent use of home exercise program to facilitate ability to maintain/progress functional gains from skilled physical therapy services. Goal status: On-going 07/12/22   Patient will demonstrate FOTO outcome > or = 51 % to indicate reduced disability due to condition. Goal status: MET 06/21/22   Pt will be able to amb c no device community surfaces with normalize gait pattern.  Goal status: On-going 07/12/22       5.  Pt will be able to navigate 1 flight of stairs with single hand rail with step  over step pattern.   Goal status: On-going 07/12/22    6. Pt will improve Left knee flexion to >/= 120 degrees to perform functional mobility.      A. Goal Status: On-going 07/12/22     PLAN: PT FREQUENCY:  2-3 x/ week    PT DURATION: 8 weeks   PLANNED INTERVENTIONS: Therapeutic exercises, Therapeutic activity, Neuromuscular re-education, Balance training, Gait training, Patient/Family education, Joint mobilization, Stair training, Electrical stimulation, Cryotherapy, Moist heat, Taping, Vasopneumatic device, and Manual therapy   PLAN FOR NEXT SESSION: review balance HEP and provide handouts, Continue to progress strengthening and functional mobiltiy, AROM,  balance and community surfaces compliance   [facility] [address] [place], [place], [address] Phone: [phone]   Fax:  [phone]

## 2022-07-25 ENCOUNTER — Encounter: Payer: Self-pay | Admitting: Orthopedic Surgery

## 2022-07-25 MED ORDER — BUPIVACAINE HCL 0.5 % IJ SOLN
9.0000 mL | INTRAMUSCULAR | Status: AC | PRN
  Administered 2022-07-19: 9 mL via INTRA_ARTICULAR

## 2022-07-25 MED ORDER — METHYLPREDNISOLONE ACETATE 40 MG/ML IJ SUSP
40.0000 mg | INTRAMUSCULAR | Status: AC | PRN
  Administered 2022-07-19: 40 mg via INTRA_ARTICULAR

## 2022-07-25 MED ORDER — LIDOCAINE HCL 1 % IJ SOLN
5.0000 mL | INTRAMUSCULAR | Status: AC | PRN
  Administered 2022-07-19: 5 mL

## 2022-07-25 NOTE — Progress Notes (Signed)
Office Visit Note   Patient: Erica Frank           Date of Birth: 13-Feb-1941           MRN: [id number] Visit Date: 07/19/2022 Requested by: Chilton Greathouse, MD [address] [place],  [place] [address] PCP: Chilton Greathouse, MD  Subjective: Chief Complaint  Patient presents with   Right Shoulder - Pain   Left Shoulder - Pain    HPI: Erica Frank is a 81 y.o. female who presents to the office complaining of bilateral shoulder pain, right greater than left.  Patient states that she had pain that began in the right shoulder without injury.  Localizes pain in the anterolateral aspect of the right shoulder primarily.  Radiates down to the mid humerus region.  Occasionally down to the hand but this is very rare.  She has numbness and tingling very rarely as well as occasional scapular pain.  No neck pain.  No history of prior shoulder surgery.  Does have history of 1 cervical spine surgery in 1990 by Dr. Danielle Frank.  Does not really have much weakness with her primary complaint being pain.  She is right-handed.  No prior physical therapy.  She did have a prior injection at another practice gave her several months of near 100% relief.  She had x-rays of this other practice that she was told she had a torn rotator cuff.  Has not had an MRI.  Right shoulder symptoms most recently have been going on for 6 months with left shoulder pain for about 2 months.  Her daughter is a Engineer, civil (consulting).  She has history of CAD and atrial fibrillation.  Takes Eliquis for her A-fib.  No history of diabetes or smoking.  Recent left total knee revision by Dr. Magnus Frank on 05/13/2022 that she is recovering from and doing well from..                ROS: All systems reviewed are negative as they relate to the chief complaint within the history of present illness.  Patient denies fevers or chills.  Assessment & Plan: Visit Diagnoses:  1. Bilateral shoulder pain, unspecified chronicity   2. Right shoulder pain, unspecified  chronicity     Plan: Patient is an 81 year old female who presents for evaluation of bilateral shoulder pain, primarily in the right shoulder.  She has had symptoms that have recurred after a successful subacromial injection at another practice and had been ongoing most recently for about 6 months.  She has a little bit of asymmetric weakness of interest Natus on exam today.  No prior shoulder surgery.  She was torn she has a rotator cuff tear though she has not had any MRI scan.  Radiographs of the shoulders today demonstrate no significant evidence of right shoulder glenohumeral arthritis or rotator cuff arthropathy.  Left shoulder glenohumeral joint has early evidence of arthritis with inferior humeral head osteophyte noted.  No significant evidence of rotator cuff damage noted on radiographs.  After discussion of options, patient would like to repeat subacromial injection today as well as obtain MRI arthrogram of the right shoulder for further evaluation of potential rotator cuff tear.  Follow-up after MRI to review results.  Tolerated injection well today.  Follow-Up Instructions: No follow-ups on file.   Orders:  Orders Placed This Encounter  Procedures   XR Shoulder Right   XR Shoulder Left   MR SHOULDER RIGHT W CONTRAST   Arthrogram   No orders of the  defined types were placed in this encounter.     Procedures: Large Joint Inj: R subacromial bursa on 07/19/2022 3:19 PM Indications: diagnostic evaluation and pain Details: 18 G 1.5 in needle, posterior approach  Arthrogram: No  Medications: 9 mL bupivacaine 0.5 %; 40 mg methylPREDNISolone acetate 40 MG/ML; 5 mL lidocaine 1 % Outcome: tolerated well, no immediate complications Procedure, treatment alternatives, risks and benefits explained, specific risks discussed. Consent was given by the patient. Immediately prior to procedure a time out was called to verify the correct patient, procedure, equipment, support staff and site/side  marked as required. Patient was prepped and draped in the usual sterile fashion.       Clinical Data: No additional findings.  Objective: Vital Signs: There were no vitals taken for this visit.  Physical Exam:  Constitutional: Patient appears well-developed HEENT:  Head: Normocephalic Eyes:EOM are normal Neck: Normal range of motion Cardiovascular: Normal rate Pulmonary/chest: Effort normal Neurologic: Patient is alert Skin: Skin is warm Psychiatric: Patient has normal mood and affect  Ortho Exam: Ortho exam demonstrates right shoulder with 70 degrees X rotation, 80 degrees abduction, 145 degrees forward flexion.  This compared with the left shoulder with 50 degrees X rotation, 85 degrees abduction, 150 degrees forward flexion.  Mild coarse grinding noted in the anterolateral aspect of the right shoulder.  No tenderness over the [facility] joint.  Mild to moderate tenderness over the bicipital groove.  She has excellent strength of supraspinatus and subscapularis with little bit of asymmetric weakness of the right shoulder compared with the left with stressing infraspinatus with resisted external rotation.  This is rated 5 -/5.  5/5 motor strength of bilateral grip strength, finger abduction, pronation/supination, bicep, tricep, deltoid.  Specialty Comments:  No specialty comments available.  Imaging: No results found.   PMFS History: Patient Active Problem List   Diagnosis Date Noted   Status post revision of total replacement of left knee 05/13/2022   Degenerative lumbar spinal stenosis 02/02/2022   S/P left TKA 10/07/2020   Left knee OA 10/07/2020   Syncope 01/16/2020   Anemia 01/16/2020   Thrombocytopenia (HCC) 01/16/2020   Paroxysmal atrial fibrillation (HCC) 02/15/2019   Mixed hyperlipidemia 02/15/2019   Bradycardia 02/15/2019   Coronary artery disease involving native coronary artery of native heart with unstable angina pectoris (HCC) 12/20/2018   Dyslipidemia 12/20/2018    Essential hypertension 12/20/2018   Past Medical History:  Diagnosis Date   Anginal pain (HCC) 1989   Anxiety    Arthritis    Atherosclerosis    Atrial fibrillation (HCC)    Coronary artery disease    Dysrhythmia    a-fib   Elevated liver enzymes    GERD (gastroesophageal reflux disease)    Heart attack (HCC) 2020   Heart disease    High cholesterol    Hypertension    NSTEMI (non-ST elevated myocardial infarction) (HCC) 07/09/2019    Family History  Problem Relation Age of Onset   Uterine cancer Mother    Hypertension Brother    Heart disease Brother    Breast cancer Cousin     Past Surgical History:  Procedure Laterality Date   ABDOMINAL HYSTERECTOMY  1983   for endometriosis with Burch   ANGIOPLASTY     ANGIOPLASTY     BACK SURGERY     1990   CARDIAC SURGERY     Catheterization   CORONARY STENT INTERVENTION N/A 07/09/2019   Procedure: CORONARY STENT INTERVENTION;  Surgeon: Yates Decamp, MD;  Location: [facility]  INVASIVE CV LAB;  Service: Cardiovascular;  Laterality: N/A;   LEFT HEART CATH AND CORONARY ANGIOGRAPHY N/A 07/09/2019   Procedure: LEFT HEART CATH AND CORONARY ANGIOGRAPHY and possible intervention;  Surgeon: Yates Decamp, MD;  Location: [facility] INVASIVE CV LAB;  Service: Cardiovascular;  Laterality: N/A;  4: or 4:30 PM today   LUMBAR LAMINECTOMY/DECOMPRESSION MICRODISCECTOMY Left 02/02/2022   Procedure: Left Lumbar two-three, Lumbar five-Sacral one Sublaminar decompression;  Surgeon: Barnett Abu, MD;  Location: [facility] OR;  Service: Neurosurgery;  Laterality: Left;   NECK SURGERY     TOTAL KNEE ARTHROPLASTY Left 10/07/2020   Procedure: TOTAL KNEE ARTHROPLASTY;  Surgeon: Durene Romans, MD;  Location: [facility] ORS;  Service: Orthopedics;  Laterality: Left;  70 mins   TOTAL KNEE REVISION Left 05/13/2022   Procedure: SYNOVECTOMY AND LEFT KNEE POLY-LINER EXCHANGE;  Surgeon: Kathryne Hitch, MD;  Location: [facility] OR;  Service: Orthopedics;  Laterality: Left;   Social History    Occupational History   Not on file  Tobacco Use   Smoking status: Never   Smokeless tobacco: Never  Vaping Use   Vaping Use: Never used  Substance and Sexual Activity   Alcohol use: Not Currently    Alcohol/week: 3.0 standard drinks of alcohol    Types: 3 Glasses of wine per week    Comment: "I havent in several months" 05/06/22   Drug use: No   Sexual activity: Not Currently    Birth control/protection: Post-menopausal    Comment: 1st intercourse 84 yo-1 partners

## 2022-07-27 ENCOUNTER — Encounter: Payer: Self-pay | Admitting: Physical Therapy

## 2022-07-27 ENCOUNTER — Ambulatory Visit: Payer: Medicare Other | Admitting: Physical Therapy

## 2022-07-27 DIAGNOSIS — M25562 Pain in left knee: Secondary | ICD-10-CM | POA: Diagnosis not present

## 2022-07-27 DIAGNOSIS — M25662 Stiffness of left knee, not elsewhere classified: Secondary | ICD-10-CM

## 2022-07-27 DIAGNOSIS — R262 Difficulty in walking, not elsewhere classified: Secondary | ICD-10-CM

## 2022-07-27 DIAGNOSIS — R600 Localized edema: Secondary | ICD-10-CM | POA: Diagnosis not present

## 2022-07-27 DIAGNOSIS — M6281 Muscle weakness (generalized): Secondary | ICD-10-CM

## 2022-07-27 NOTE — Therapy (Signed)
[facility] Physical Therapy [address] [place], [place], [address] Phone: [phone]   Fax:  [phone]  Patient Details  Name: Erica Frank MRN: [id number] Date of Birth: [birth year] Referring Provider:  Prince Solian, MD  Encounter Date: 07/27/2022    OUTPATIENT PHYSICAL THERAPY DISCHARGE NOTE    Patient Name: Erica Frank MRN: [id number] DOB:[birth year], 81 y.o., female Today's Date: 07/27/2022  PCP: Prince Solian, MD REFERRING PROVIDER: Mcarthur Rossetti, MD  PHYSICAL THERAPY DISCHARGE SUMMARY  Visits from Start of Care: 17  Current functional level related to goals / functional outcomes: See objective   Remaining deficits: See objective   Education / Equipment: See objective   Patient agrees to discharge. Patient goals were met. Patient is being discharged due to meeting the stated rehab goals.  END OF SESSION:   PT End of Session - 07/27/22 1423     Visit Number 17    Number of Visits 24    Date for PT Re-Evaluation 07/30/22    Authorization Type [employer] medicare, PN sent at visit 9    Progress Note Due on Visit 43    PT Start Time 1431    PT Stop Time 6073    PT Time Calculation (min) 43 min    Activity Tolerance Patient tolerated treatment well    Behavior During Therapy WFL for tasks assessed/performed             Past Medical History:  Diagnosis Date   Anginal pain ([place]) 1989   Anxiety    Arthritis    Atherosclerosis    Atrial fibrillation ([place])    Coronary artery disease    Dysrhythmia    a-fib   Elevated liver enzymes    GERD (gastroesophageal reflux disease)    Heart attack ([place]) 2020   Heart disease    High cholesterol    Hypertension    NSTEMI (non-ST elevated myocardial infarction) ([place]) 07/09/2019   Past Surgical History:  Procedure Laterality Date   ABDOMINAL HYSTERECTOMY  1983   for endometriosis with Burch   ANGIOPLASTY     ANGIOPLASTY     BACK SURGERY     1990   CARDIAC  SURGERY     Catheterization   CORONARY STENT INTERVENTION N/A 07/09/2019   Procedure: CORONARY STENT INTERVENTION;  Surgeon: Adrian Prows, MD;  Location: [place] CV LAB;  Service: Cardiovascular;  Laterality: N/A;   LEFT HEART CATH AND CORONARY ANGIOGRAPHY N/A 07/09/2019   Procedure: LEFT HEART CATH AND CORONARY ANGIOGRAPHY and possible intervention;  Surgeon: Adrian Prows, MD;  Location: [place] CV LAB;  Service: Cardiovascular;  Laterality: N/A;  4: or 4:30 PM today   LUMBAR LAMINECTOMY/DECOMPRESSION MICRODISCECTOMY Left 02/02/2022   Procedure: Left Lumbar two-three, Lumbar five-Sacral one Sublaminar decompression;  Surgeon: Kristeen Miss, MD;  Location: [place];  Service: Neurosurgery;  Laterality: Left;   NECK SURGERY     TOTAL KNEE ARTHROPLASTY Left 10/07/2020   Procedure: TOTAL KNEE ARTHROPLASTY;  Surgeon: Paralee Cancel, MD;  Location: [facility] OR[facility];  Service: Orthopedics;  Laterality: Left;  70 mins   TOTAL KNEE REVISION Left 05/13/2022   Procedure: SYNOVECTOMY AND LEFT KNEE POLY-LINER EXCHANGE;  Surgeon: Mcarthur Rossetti, MD;  Location: [place];  Service: Orthopedics;  Laterality: Left;   Patient Active Problem List   Diagnosis Date Noted   Status post revision of total replacement of left knee 05/13/2022   Degenerative lumbar spinal stenosis 02/02/2022   S/P left TKA 10/07/2020   Left knee OA 10/07/2020  Syncope 01/16/2020   Anemia 01/16/2020   Thrombocytopenia ([place]) 01/16/2020   Paroxysmal atrial fibrillation ([place]) 02/15/2019   Mixed hyperlipidemia 02/15/2019   Bradycardia 02/15/2019   Coronary artery disease involving native coronary artery of native heart with unstable angina pectoris ([place]) 12/20/2018   Dyslipidemia 12/20/2018   Essential hypertension 12/20/2018    REFERRING DIAG: B01.751 (ICD-10-CM) - Status post revision of total knee, left  ONSET DATE: 05/13/2022  THERAPY DIAG:  Acute pain of left knee  Stiffness of left knee, not elsewhere classified  Muscle  weakness (generalized)  Localized edema  Difficulty in walking, not elsewhere classified  Rationale for Evaluation and Treatment Rehabilitation  PERTINENT HISTORY: HTN, MI, A-fib, CAD, L lumbar laminectomy, atherosclerosis, angina, HTN  PRECAUTIONS: None  SUBJECTIVE: She notes some increased stiffness today, and notes that today is the best day she's had in 2 years. She has some sciatica sx today to her Lt butt, suspecting it could just be from extended sitting with her mother or how she slept. Gardening is still a limitation for her, including bending and walking in yard.  PAIN:  NPRS scale: 0/10 today Pain location: left knee more in front/medial Pain description: achy & burning, tightness Aggravating factors: exercising bending knee Relieving factors: laying down, meds & ice  OBJECTIVE: (objective measures completed at initial evaluation unless otherwise dated)  PATIENT SURVEYS:  05/31/22: FOTO 36% (predicted 51%)     06/21/22: FOTO 59%   07/12/22: FOTO  43%   07/27/22: FOTO 52%                 SENSATION: 06/01/2022:  WFL   EDEMA:  06/01/2022:  Circumferential: Rt knee : 37.5 centimeters 06/01/2022:  Circumferential: Left knee: 39 centimeters   MUSCLE LENGTH: 06/01/2022:  Hamstrings: Right 86 deg; Left 75 deg  POSTURE: 06/01/2022:  rounded shoulders and forward head  PALPATION: 06/01/2022:  Tender on medial and lateral joint line with more pain reported on medial side  LOWER EXTREMITY ROM:  Active ROM Right Eval 06/01/22 Left Eval 06/01/22 06/07/22 06/09/2022 06/14/22  06/21/22 06/23/22  06/28/22 07/12/22 07/21/22 Left 07/27/22 Left  Hip flexion               Hip extension               Hip abduction               Hip adduction               Knee flexion 125 80 90 AAROM  Supine AROM heel slide: 97 Supine Active 96 Supine Active 106 Supine  Active 108 Supine  Active 110 Supine Acitve 115 Supine  115 Supine 115  Knee extension 0 -4 -4  -4 -4 -5 -4 -3 -1    (Blank rows  = not tested)   Passive ROM Left Eval 06/01/22 Left 06/14/22 supine Left 06/16/22 supine Left 06/21/22 supine Left 07/21/22 Left  07/27/22  Hip flexion         Hip extension         Hip abduction         Hip adduction         Knee flexion 88 100 110 110 120 119  Knee extension -2 -2 -2 -2         LOWER EXTREMITY MMT:  MMT Right Eval 06/01/22 Left Eval 06/01/22 Left 07/14/22  Hip flexion 5/5 4/5 4+/5  Hip extension       Hip abduction 4/5 4/5  4+/5  Hip adduction 4/5 4/5 4+/5  Hip internal rotation       Hip external rotation       Knee flexion 5/5 2/5 4/5  Knee extension 5/5 2/5 4/5  Ankle dorsiflexion 5/5 5/5   Ankle plantarflexion 5/5 5/5   Ankle inversion       Ankle eversion        (Blank rows = not tested)       FUNCTIONAL TESTS:  06/01/2022:  5 times sit to stand: 15 seconds with UE support 07/12/22: 5 time sit to stand: 8 seconds with no UE support   GAIT: 06/01/2022:  Distance walked: 50 feet  Assistive device utilized: Environmental consultant - 2 wheeled Level of assistance: Modified independence Comments: antalgic gait pattern, decreased left knee flexion and hip flexion     TODAY'S TREATMENT: 07/27/22 Therapeutic Exercise: Nustep x 8 minutes Level 7 UE/LE Knee flexion gravity assist AAROM x 5 reps PT reviewed HEP recommendations and general activity suggestions, provided handout of printed HEP suggestions PT suggest walking in yard with cane starting in small increments to work towards gardening ability  [place] PT education on scar mobility indications for hypersensitivity, pt verbalized understanding PT education on sleep recommendations when limited by pain including massage and positioning, pt verbalized understanding   07/21/22 Therapeutic Exercise: Nustep x 8 minutes Level 7 UE/LE Leg Extension: bil LE's lifting 10#, lowering c Left LE 2 x 12 Leg Curl machine: Left LE only 15# 2 x 12 AA heel slides x 15 reps on Lt ROM measurements as noted above   Neuro  Re-ed On foam with feet together: EC 5x10 sec; horizontal/vertical head turns with EO x 10 reps, then EC x 10 reps Tandem gait in // bars with 1UE support x 5 laps  Modalities: vasopnuematic X 10 min to left medium compression 34 degrees  07/19/22: Therapeutic Exercise: Nustep x 8 minutes Level 7 UE/LE Slant board x 3 holding 30 seconds Leg Curl machine: Left LE only 15# 2 x 12 Leg Extension: bil LE's lifting 10#, lowering c Left LE 2 x 12 Leg press : double leg 87# 2 x 15, Lt leg 50# 2 x 15  Stairs in hall reciprocal X 2 flights, using bilat handrails. Seated knee flexion AAROM stretch 5 sec X 15 for left Neuro Re-ed Heel tapping on 6 inch step x 20 alternating each LE Tandem walk 3 round trips at counter top without UE support March walk 3 round trips at counter top without UE support  Modalities: vasopnuematic X 10 min to left medium compression 34 degrees  07/14/22: Therapeutic Exercise: Nustep x 8 minutes Level 5 (508 steps) Slant board x 3 holding 20 seconds Leg Curl machine: Left LE only 15# 2 x 10 Leg Extension: bil LE's lifting 10#, lowering c Left LE 2 x 10 Leg press : double leg 87# 2 x 15, Lt leg 50# 2 x 15  Step up 4  inch with no UE support x 15  Neuro Re-ed Stepping over cones x 6 with close supervision Lateral stepping over 6 cones with close supervision to CGA for 2 LOB Rocker board x 2 minutes Heel tapping on 4 inch step x 20 alternating each LE Walking in figure 8 pattern around 3 cones  Modalities: vasopnuematic X 10 min to left medium compression 34 degrees   PATIENT EDUCATION:  Education details: PT POC, HEP Person educated: Patient Education method: Explanation, Demonstration, Corporate treasurer cues, Verbal cues, and Handouts Education comprehension: verbalized understanding,  returned demonstration, and needs further education     HOME EXERCISE PROGRAM: Access Code: [email or web address] URL: [email or web address] Date: 07/21/2022 Prepared by:  Faustino Congress  Exercises - Supine Straight Leg Raises  - 3 x daily - 7 x weekly - 2 sets - 10 reps - Supine Heel Slide with Strap  - 3 x daily - 7 x weekly - 2 sets - 10 reps - Long Sitting Quad Set with Towel Roll Under Heel  - 3 x daily - 7 x weekly - 2 sets - 10 reps - 5 seconds hold - Supine Single Leg Hip Flexion with Knee Bent  - 3 x daily - 7 x weekly - 1 sets - 3 reps - 30-60 seconds hold - Seated Long Arc Quad  - 3 x daily - 7 x weekly - 2 sets - 10 reps - 3 seconds hold - Seated Knee Flexion AAROM  - 3 x daily - 7 x weekly - 10 reps - 10 seconds hold - Standing March with Counter Support  - 3 x daily - 7 x weekly - 2 sets - 10 reps - 5 seconds hold - Standing Knee Flexion AROM with Chair Support  - 3 x daily - 7 x weekly - 2 sets - 10 reps - 5 seconds hold - Romberg Stance Eyes Closed on Foam Pad  - 2 x daily - 7 x weekly - 1 sets - 5 reps - 10-15 sec hold - Romberg Stance with Head Nods on Foam Pad  - 2 x daily - 7 x weekly - 1 sets - 1 reps - 10 rotations each way  Patient Education - Scar Massage   ASSESSMENT:   CLINICAL IMPRESSION: Erica Frank has progressed very well with therapy. Improved impairments include: ROM, strength and endurance. Functional improvements include: Walking, stairs, sit to stands, and community ambulation without an AD. Barriers to progress include: Fear of falling. Since patients stat of skilled care, she has made significant improvement in her functional mobility, she states that she is now able to perform all daily and recreational activities without pain. Please see GOALS section for progress on short term and long term goals established at evaluation.  I recommend D/C home with HEP; pt agrees with plan.  OBJECTIVE IMPAIRMENTS Abnormal gait, decreased activity tolerance, decreased balance, decreased mobility, difficulty walking, decreased ROM, decreased strength, increased edema, impaired flexibility, and pain.    ACTIVITY LIMITATIONS  lifting, bending, sitting, standing, sleeping, stairs, transfers, and dressing   PARTICIPATION LIMITATIONS: cleaning, driving, and shopping   PERSONAL FACTORS   HTN, MI, A-fib, CAD, L lumbar laminectomy, atherosclerosis, angina, HTN,  are also affecting patient's functional outcome.    REHAB POTENTIAL: Good   CLINICAL DECISION MAKING: Stable/uncomplicated   EVALUATION COMPLEXITY: Low     GOALS: Goals reviewed with patient? Yes   SHORT TERM GOALS: Target date: 07/06/2022  Patient will demonstrate independent use of initial home exercise program to maintain progress from in clinic treatments. Goal status: MET 06/14/22   2. Pt will be able to navigate curb step with st cane modified independently.                    A. Goal Status: MET 06/14/22        Long term PT goals (target dates for all long term goals are 8 weeks  07/30/2022 ) Patient will demonstrate/report pain at worst less than or equal to 2/10 with ADL's.  Goal status: MET 07/12/22   Patient will  demonstrate independent use of home exercise program to facilitate ability to maintain/progress functional gains from skilled physical therapy services. Goal status: MET 07/27/22  Patient will demonstrate FOTO outcome > or = 51 % to indicate reduced disability due to condition. Goal status: MET 06/21/22   Pt will be able to amb c no device community surfaces with normalize gait pattern.  Goal status: MET 07/27/22       5.  Pt will be able to navigate 1 flight of stairs with single hand rail with step  over step pattern.   Goal status: MET 07/27/22   6. Pt will improve Left knee flexion to >/= 120 degrees to perform functional mobility.      A. Goal Status: MET 07/21/22   Jana Hakim 07/27/22, 4:07 PM  This entire session of physical therapy was performed under the direct supervision of PT signing evaluation /treatment. PT reviewed note and agrees.  Rudi Heap 07/27/22, 4:09 PM  [facility] Physical Therapy [address] [place], [place], [address] Phone: [phone]   Fax:  [phone]

## 2022-08-04 ENCOUNTER — Telehealth: Payer: Self-pay | Admitting: Orthopedic Surgery

## 2022-08-04 ENCOUNTER — Other Ambulatory Visit: Payer: Self-pay | Admitting: Orthopaedic Surgery

## 2022-08-04 MED ORDER — BACLOFEN 10 MG PO TABS: 10.0000 mg | ORAL_TABLET | Freq: Two times a day (BID) | ORAL | 0 refills | Status: DC | PRN

## 2022-08-04 NOTE — Telephone Encounter (Signed)
LMOM for patient letting her know we sent Rx in for her

## 2022-08-04 NOTE — Telephone Encounter (Signed)
[name] called in to advise that she is having muscle spasms in her knee at night.  These spasms are keeping her up at night and she is not sleeping.  She takes methocarbamol and it is not helping.  Is there something else she can do/take?  She uses [facility] on [address].  Her call back # is [phone].

## 2022-08-05 ENCOUNTER — Other Ambulatory Visit: Payer: Medicare Other

## 2022-08-05 ENCOUNTER — Inpatient Hospital Stay: Admission: RE | Admit: 2022-08-05 | Payer: Medicare Other | Source: Ambulatory Visit

## 2022-08-06 ENCOUNTER — Telehealth: Payer: Self-pay

## 2022-08-06 NOTE — Telephone Encounter (Signed)
[facility] called to let us know they will be faxing a Prior authorization request for Praluent.

## 2022-08-11 ENCOUNTER — Ambulatory Visit: Payer: Medicare Other | Admitting: Orthopedic Surgery

## 2022-08-16 ENCOUNTER — Telehealth: Payer: Self-pay

## 2022-08-16 ENCOUNTER — Other Ambulatory Visit: Payer: Self-pay

## 2022-08-16 DIAGNOSIS — E7800 Pure hypercholesterolemia, unspecified: Secondary | ICD-10-CM

## 2022-08-16 DIAGNOSIS — I25118 Atherosclerotic heart disease of native coronary artery with other forms of angina pectoris: Secondary | ICD-10-CM

## 2022-08-16 MED ORDER — PRALUENT 75 MG/ML ~~LOC~~ SOAJ: 75.0000 mg | SUBCUTANEOUS | 3 refills | Status: DC

## 2022-08-16 NOTE — Telephone Encounter (Signed)
Does she qualify for Repatha patient assistance?

## 2022-08-18 NOTE — Telephone Encounter (Signed)
Tried calling patient no answer left a vm. On patient's chart repatha is under patient's allergies seems like patient had a reaction called to confirm with patient.

## 2022-08-30 ENCOUNTER — Ambulatory Visit: Payer: Medicare Other

## 2022-08-31 ENCOUNTER — Ambulatory Visit: Payer: Medicare Other

## 2022-09-02 ENCOUNTER — Ambulatory Visit
Admission: RE | Admit: 2022-09-02 | Discharge: 2022-09-02 | Disposition: A | Discharge status: Home / Self Care | Payer: Medicare Other | Source: Ambulatory Visit | Attending: Internal Medicine | Admitting: Internal Medicine

## 2022-09-02 DIAGNOSIS — Z1231 Encounter for screening mammogram for malignant neoplasm of breast: Secondary | ICD-10-CM

## 2022-09-27 ENCOUNTER — Ambulatory Visit: Payer: Medicare Other | Admitting: Orthopaedic Surgery

## 2022-09-27 ENCOUNTER — Encounter: Payer: Self-pay | Admitting: Orthopaedic Surgery

## 2022-09-27 ENCOUNTER — Ambulatory Visit (INDEPENDENT_AMBULATORY_CARE_PROVIDER_SITE_OTHER): Payer: Medicare Other

## 2022-09-27 DIAGNOSIS — Z96652 Presence of left artificial knee joint: Secondary | ICD-10-CM

## 2022-09-27 NOTE — Progress Notes (Signed)
The patient is now 4 months status post a left knee polyliner exchange and scar tissue removal.  Her original knee that was done by 1 my colleagues in town over a year ago but she had chronic pain since then.  She said that she is not 100% but doing better overall.  She reports less pain.  She swims on a regular basis.  She is an active 81 year old female.  On exam her range of motion is excellent of that left knee.  It feels limply stable.  There is some swelling still to be expected.  Overall she looks great.  2 views of the left knee show a well-seated knee replacement with no complicating features.  The AP standing view also shows a right knee that is arthritic.  She did let me know that steroid injections have lasted her quite some time for that knee and that if her knee flares up again and has pain that she would come in for another injection.  From my standpoint I will need to see her back for 6 months unless she is having issues.  No x-rays are needed at her next visit.

## 2022-10-14 ENCOUNTER — Telehealth: Payer: Self-pay | Admitting: Cardiology

## 2022-10-14 NOTE — Telephone Encounter (Signed)
Patient would like to know if she needs blood work done prior to her f/u appt 12/03/22.

## 2022-10-15 ENCOUNTER — Encounter: Payer: Self-pay | Admitting: Cardiology

## 2022-10-15 NOTE — Telephone Encounter (Signed)
She does not need any. I sent her mychart message also. Next time ask if they are active on mychart

## 2022-10-21 NOTE — Telephone Encounter (Signed)
Called patient and LMAM to let her know

## 2022-11-23 ENCOUNTER — Ambulatory Visit: Payer: Medicare Other

## 2022-11-23 DIAGNOSIS — I6523 Occlusion and stenosis of bilateral carotid arteries: Secondary | ICD-10-CM

## 2022-12-03 ENCOUNTER — Ambulatory Visit: Payer: Medicare Other | Admitting: Cardiology

## 2022-12-08 ENCOUNTER — Encounter: Payer: Self-pay | Admitting: Orthopaedic Surgery

## 2022-12-08 ENCOUNTER — Encounter: Payer: Self-pay | Admitting: Cardiology

## 2022-12-08 ENCOUNTER — Ambulatory Visit: Payer: Medicare Other | Admitting: Cardiology

## 2022-12-08 ENCOUNTER — Ambulatory Visit: Payer: Medicare Other | Admitting: Physician Assistant

## 2022-12-08 VITALS — BP 108/68 | HR 71 | Temp 97.3°F | Resp 16 | Ht 60.0 in | Wt 126.4 lb

## 2022-12-08 DIAGNOSIS — I25118 Atherosclerotic heart disease of native coronary artery with other forms of angina pectoris: Secondary | ICD-10-CM

## 2022-12-08 DIAGNOSIS — E7800 Pure hypercholesterolemia, unspecified: Secondary | ICD-10-CM

## 2022-12-08 DIAGNOSIS — I480 Paroxysmal atrial fibrillation: Secondary | ICD-10-CM

## 2022-12-08 DIAGNOSIS — M1711 Unilateral primary osteoarthritis, right knee: Secondary | ICD-10-CM

## 2022-12-08 DIAGNOSIS — I6523 Occlusion and stenosis of bilateral carotid arteries: Secondary | ICD-10-CM

## 2022-12-08 MED ORDER — NITROGLYCERIN 0.4 MG SL SUBL: 0.4000 mg | SUBLINGUAL_TABLET | SUBLINGUAL | 2 refills | Status: AC | PRN

## 2022-12-08 MED ORDER — METHYLPREDNISOLONE ACETATE 40 MG/ML IJ SUSP
40.0000 mg | INTRAMUSCULAR | Status: AC | PRN
  Administered 2022-12-08: 40 mg via INTRA_ARTICULAR

## 2022-12-08 MED ORDER — LIDOCAINE HCL 1 % IJ SOLN
3.0000 mL | INTRAMUSCULAR | Status: AC | PRN
  Administered 2022-12-08: 3 mL

## 2022-12-08 NOTE — Progress Notes (Signed)
   Procedure Note  Patient: Erica Frank             Date of Birth: 1941/06/04           MRN: [id number]             Visit Date: 12/08/2022 HPI: Erica Frank shows up today out of an appointment for right knee injection.  She states she was told that she could show up whenever she needed an injection in the knee.  She has known arthritis of the right knee.  She denies any injury to the knee.  Denies any fevers or chills.  She is trying to avoid right knee surgery.  Cortisone injections in the past been beneficial.  Review of systems see HPI otherwise negative  Physical exam: General well-developed well-nourished female in no acute distress ambulates with antalgic gait no assistive device. Right knee full extension flexion beyond 90 degrees.  No abnormal warmth erythema or effusion.  Global tenderness.   Procedures: Visit Diagnoses:  1. Primary osteoarthritis of right knee     Large Joint Inj: R knee on 12/08/2022 11:44 AM Indications: pain Details: 22 G 1.5 in needle, anterolateral approach  Arthrogram: No  Medications: 3 mL lidocaine 1 %; 40 mg methylPREDNISolone acetate 40 MG/ML Outcome: tolerated well, no immediate complications Procedure, treatment alternatives, risks and benefits explained, specific risks discussed. Consent was given by the patient. Immediately prior to procedure a time out was called to verify the correct patient, procedure, equipment, support staff and site/side marked as required. Patient was prepped and draped in the usual sterile fashion.     Plan: She understands to wait 3 months between injections.  Questions were encouraged and answered.  Ace wrap was applied she will remove this this evening before returning to bed.  Follow-up as

## 2022-12-08 NOTE — Progress Notes (Signed)
Primary Physician/Referring:  Prince Solian, MD  Patient ID: Erica Frank, female    DOB: 09-07-1941, 82 y.o.   MRN: [id number]  No chief complaint on file.  HPI:    Erica Frank  is a 82 y.o. emale  with hypertension, hyperlipidemia, asymptomatic chronic sinus bradycardia, and one episode of atrial fibrillation on 01/25/2019 S/P cardioversion in the ED, CAD and balloon angioplasty in 1989 and 1990, NSTEMI on 07/09/2019 S/P stenting to the mid LAD. She did not tolerate Repatha due to severe myalgias and arthralgias, this was switched over to Praluent which she is tolerating and has had no problems with this.  She is also on Vytorin.  This is a 59-monthoffice visit.  Patient has had her anginal symptoms come back in the form of pain in the middle of the back that started about 6 weeks ago, but has not had recurrence in the last 2 weeks or so.  Back pain is improved surgery 6 months ago.  Degenerative joint disease of her right knee still is a problem.  Past Medical History:  Diagnosis Date   Anginal pain ([facility] 1989   Anxiety    Arthritis    Atherosclerosis    Atrial fibrillation ([facility]    Coronary artery disease    Dysrhythmia    a-fib   Elevated liver enzymes    GERD (gastroesophageal reflux disease)    Heart attack ([facility] 2020   Heart disease    High cholesterol    Hypertension    NSTEMI (non-ST elevated myocardial infarction) ([facility] 07/09/2019   Past Surgical History:  Procedure Laterality Date   ABDOMINAL HYSTERECTOMY  1983   for endometriosis with Burch   ANGIOPLASTY     ANGIOPLASTY     BACK SURGERY     1990   CARDIAC SURGERY     Catheterization   CORONARY STENT INTERVENTION N/A 07/09/2019   Procedure: CORONARY STENT INTERVENTION;  Surgeon: GAdrian Prows MD;  [facility];  Service: Cardiovascular;  Laterality: N/A;   LEFT HEART CATH AND CORONARY ANGIOGRAPHY N/A 07/09/2019   Procedure: LEFT HEART CATH AND CORONARY ANGIOGRAPHY and possible  intervention;  Surgeon: GAdrian Prows MD;  [facility];  Service: Cardiovascular;  Laterality: N/A;  4: or 4:30 PM today   LUMBAR LAMINECTOMY/DECOMPRESSION MICRODISCECTOMY Left 02/02/2022   Procedure: Left Lumbar two-three, Lumbar five-Sacral one Sublaminar decompression;  Surgeon: EKristeen Miss MD;  [facility]  Service: Neurosurgery;  Laterality: Left;   NECK SURGERY     TOTAL KNEE ARTHROPLASTY Left 10/07/2020   Procedure: TOTAL KNEE ARTHROPLASTY;  Surgeon: OParalee Cancel MD;  Location: [facility];  Service: Orthopedics;  Laterality: Left;  70 mins   TOTAL KNEE REVISION Left 05/13/2022   Procedure: SYNOVECTOMY AND LEFT KNEE POLY-LINER EXCHANGE;  Surgeon: BMcarthur Rossetti MD;  [facility]  Service: Orthopedics;  Laterality: Left;   Family History  Problem Relation Age of Onset   Uterine cancer Mother    Hypertension Brother    Heart disease Brother    Breast cancer Cousin     Social History   Tobacco Use   Smoking status: Never   Smokeless tobacco: Never  Substance Use Topics   Alcohol use: Not Currently    Alcohol/week: 3.0 standard drinks of alcohol    Types: 3 Glasses of wine per week    Comment: "I havent in several months" 05/06/22   Marital Status: Widowed  ROS  Review of Systems  Cardiovascular:  Negative for dyspnea on exertion, leg swelling and syncope (near syncope).  Musculoskeletal:  Positive for arthritis (bilateral knee) and joint pain (bilateral knee pain).  Gastrointestinal:  Negative for melena.  Neurological:  Positive for focal weakness (left leg due to arthritis).  All other systems reviewed and are negative.  Objective  There were no vitals taken for this visit.     06/05/2022   10:30 PM 06/05/2022   10:15 PM 06/05/2022    9:05 PM  Vitals with BMI  Systolic [phone]  Diastolic 85 78 87  Pulse 63 63 57     Physical Exam Constitutional:      Appearance: She is well-developed.  Neck:     Vascular: No carotid bruit or JVD.   Cardiovascular:     Rate and Rhythm: Normal rate and regular rhythm.     Pulses: Intact distal pulses.     Heart sounds: No murmur heard.    No gallop.  Pulmonary:     Effort: Pulmonary effort is normal. No accessory muscle usage.     Breath sounds: Normal breath sounds.  Abdominal:     General: Bowel sounds are normal.     Palpations: Abdomen is soft.  Musculoskeletal:        General: No swelling.  Skin:    Capillary Refill: Capillary refill takes less than 2 seconds.    Laboratory examination:   Recent Labs    05/06/22 1400 05/14/22 0459 06/05/22 1916  NA 139 137 134*  K 4.5 4.6 4.0  CL 105 103 99  CO2 _0 GLUCOSE 97 119* 97  BUN 17 6* 19  CREATININE 0.80 0.79 0.83  CALCIUM 8.9 8.9 9.8  GFRNONAA >60 >60 >60       Latest Ref Rng & Units 06/05/2022    7:16 PM 05/14/2022    4:59 AM 05/06/2022    2:00 PM  CMP  Glucose 70 - 99 mg/dL 97  119  97   BUN 8 - 23 mg/dL _1 Creatinine 0.44 - 1.00 mg/dL 0.83  0.79  0.80   Sodium 135 - 145 mmol/L 134  137  139   Potassium 3.5 - 5.1 mmol/L 4.0  4.6  4.5   Chloride 98 - 111 mmol/L 99  103  105   CO2 22 - 32 mmol/L _2 Calcium 8.9 - 10.3 mg/dL 9.8  8.9  8.9   Total Protein 6.5 - 8.1 g/dL 7.0   6.3   Total Bilirubin 0.3 - 1.2 mg/dL 0.4   0.3   Alkaline Phos 38 - 126 U/L 70   66   AST 15 - 41 U/L 19   28   ALT 0 - 44 U/L 14   25       Latest Ref Rng & Units 06/05/2022    7:16 PM 05/14/2022    4:59 AM 05/06/2022    2:00 PM  CBC  WBC 4.0 - 10.5 K/uL 6.1  8.2  7.4   Hemoglobin 12.0 - 15.0 g/dL 12.6  13.7  13.7   Hematocrit 36.0 - 46.0 % 38.8  41.2  42.9   Platelets 150 - 400 K/uL 357  256  255    External labs:  Cholesterol, total 114.000 m 02/09/2022 HDL 51.000 mg 02/09/2022 LDL 34.000 mg 02/09/2022 Triglycerides 143.000 m 02/09/2022  Hemoglobin 13.700 g/d 05/14/2022 Platelets 256.000 K/ 05/14/2022  Creatinine, Serum 0.790 mg/ 05/14/2022 Potassium 4.600  mm 05/14/2022 ALT (SGPT) 25.000 U/L 05/06/2022  TSH 1.070  02/09/2022  Medications and allergies   Allergies  Allergen Reactions   Hydrocodone     "Makes her feel crazy" Depressed   Repatha [Evolocumab] Diarrhea      Medications after today's encounter  Current Outpatient Medications:    acetaminophen (TYLENOL) 500 MG tablet, Take 1,000 mg by mouth daily as needed (pain)., Disp: , Rfl:    Alirocumab (PRALUENT) 75 MG/ML SOAJ, Inject 75 mg into the skin every 14 (fourteen) days., Disp: 6 mL, Rfl: 3   allopurinol (ZYLOPRIM) 100 MG tablet, Take 100 mg by mouth daily., Disp: , Rfl:    apixaban (ELIQUIS) 5 MG TABS tablet, Take 1 tablet (5 mg total) by mouth 2 (two) times daily., Disp: 180 tablet, Rfl: 3   baclofen (LIORESAL) 10 MG tablet, Take 1 tablet (10 mg total) by mouth 2 (two) times daily as needed for muscle spasms., Disp: 60 each, Rfl: 0   BIOTIN PO, Take 1 tablet by mouth daily. Hair, skin & nails, Disp: , Rfl:    CALCIUM PO, Take 1 tablet by mouth daily., Disp: , Rfl:    Cyanocobalamin (VITAMIN B-12 PO), Take 1 tablet by mouth See admin instructions. Take 1 tablet by mouth daily when able to remember, Disp: , Rfl:    DULoxetine (CYMBALTA) 60 MG capsule, Take 60 mg by mouth See admin instructions. Take 60 mg by mouth daily PRN for knee pain, Disp: , Rfl:    ezetimibe-simvastatin (VYTORIN) 10-40 MG tablet, TAKE 1 TABLET BY MOUTH DAILY, Disp: 90 tablet, Rfl: 1   isosorbide mononitrate (IMDUR) 60 MG 24 hr tablet, TAKE 1 TABLET(60 MG) BY MOUTH DAILY (Patient taking differently: Take 60 mg by mouth daily.), Disp: 90 tablet, Rfl: 3   lisinopril (ZESTRIL) 5 MG tablet, TAKE 1 TABLET(5 MG) BY MOUTH DAILY (Patient taking differently: Take 5 mg by mouth at bedtime.), Disp: 90 tablet, Rfl: 3   methocarbamol (ROBAXIN) 500 MG tablet, Take 1 tablet (500 mg total) by mouth every 6 (six) hours as needed for muscle spasms., Disp: 30 tablet, Rfl: 0   nitroGLYCERIN (NITROSTAT) 0.4 MG SL tablet, Place 1 tablet (0.4 mg total) under the tongue every 5 (five) minutes as  needed for chest pain., Disp: 25 tablet, Rfl: 11   oxyCODONE (OXY IR/ROXICODONE) 5 MG immediate release tablet, Take 1 tablet (5 mg total) by mouth every 6 (six) hours as needed for moderate pain (pain score 4-6)., Disp: 30 tablet, Rfl: 0   pantoprazole (PROTONIX) 20 MG tablet, Take 1 tablet (20 mg total) by mouth daily., Disp: 30 tablet, Rfl: 0   Polyvinyl Alcohol-Povidone PF (REFRESH) 1.4-0.6 % SOLN, Place 1 drop into both eyes daily as needed (Dry eyes)., Disp: , Rfl:    traMADol (ULTRAM) 50 MG tablet, Take 100 mg by mouth daily., Disp: , Rfl:     Radiology:   CT Head Wo Contrast 01/16/2020: No acute intracranial abnormality noted. Diffuse mucosal thickening within the right maxillary antrum.  Cardiac Studies:   Coronary Angiography & Balloon Angioplasty of LAD in 1989 and 1990.   DCCV 01/25/2019: 120J x 1 to NSR.  Lexiscan myoview stress test 02/26/2019:  1. Lexiscan stress test was performed. Exercise capacity was not assessed. No stress symptoms reported. Resting blood pressure was 110/70 mmHg and peak effect blood pressure was 146/60 mmHg. The resting and stress electrocardiogram demonstrated normal sinus rhythm, normal  conduction, occasional PAC, and normal repolarization.   Stress EKG is non diagnostic  for ischemia as it is a pharmacologic stress.  2. The overall quality of the study is good. There is no evidence of abnormal lung activity. Stress and rest SPECT images demonstrate homogeneous tracer distribution throughout the myocardium. Gated SPECT imaging reveals normal myocardial thickening and wall motion. The left ventricular ejection fraction was normal (52%).   3. Low risk study.  Coronary Angiography 07/09/2019: Moderate amount of diffuse coronary calcification noted in all coronary vessels including left main.  Right coronary artery and ramus intermediate showed mid 30% stenosis.  Mid LAD had a focal moderately calcified high-grade 95% stenosis S/P  STENT SYNERGY DES 3X24.  Maximum pressure: 16 atm. Inflation time: 75 sec. Stent strut is well apposed.  There was residual 0% stenosis with maintenance of TIMI-3 TIMI-3 flow  Echocardiogram 04/23/2019: Normal LV systolic function with EF 59%. Left ventricle cavity is normal in size. Normal global wall motion. Calculated EF 59%. Moderate (Grade II) mitral regurgitation. E-wave dominant mitral inflow. Mild tricuspid regurgitation. No evidence of pulmonary hypertension.  Event monitor 02/01/2020 - 03/01/2020.  Baseline sample showed Sinus Rhythm w/Artifact with a heart rate of 61.5 bpm. There were 1 critical, 0 serious, and 5 stable events that occurred.  4 patient activated events were accidental push or no symptoms reported revealed normal sinus rhythm. There was a 11 beat NSVT noted at the rate of 178 bpm on 02/04/2020 at 5:41 AM.  Otherwise no heart block, no atrial fibrillation, rare PVCs noted.  Carotid artery duplex 11/23/2022: Duplex suggests stenosis in the right internal carotid artery (50-69%). Duplex suggests stenosis in the right external carotid artery (<50%).  Duplex suggests stenosis in the left internal carotid artery (50-69%). Duplex suggests stenosis in the left external carotid artery (<50%). Antegrade right vertebral artery flow. Antegrade left vertebral artery flow. Compared to the study done on 05/06/2022, mild progression of the disease on the right. Follow up in six months is appropriate if clinically indicated.  EKG   EKG 12/08/2022: Sinus bradycardia at rate of 55 bpm, poor R wave progression, probably normal variant.  Cannot exclude anteroseptal infarct old.  No evidence of ischemia otherwise normal EKG. No significant change from  EKG 05/24/2022   Assessment     ICD-10-CM   1. Coronary artery disease of native artery of native heart with stable angina pectoris ([place])  I25.118     2. Asymptomatic bilateral carotid artery stenosis  I65.23     3. Pure hypercholesterolemia  E78.00     4.  Paroxysmal atrial fibrillation (HCC)  I48.0       No orders of the defined types were placed in this encounter.   There are no discontinued medications.    Recommendations:   Ms. Shadai Mcclane  is a 82 y.o. female  with hypertension, hyperlipidemia, asymptomatic chronic sinus bradycardia, and one episode of atrial fibrillation on 01/25/2019 S/P cardioversion in the ED, CAD and balloon angioplasty in 1989 and 1990, NSTEMI on 07/09/2019 S/P stenting to the mid LAD. She did not tolerate Repatha due to severe myalgias and arthralgias, this was switched over to Praluent which she is tolerating and has had no problems with this.  She is also on Vytorin.  This is a 66-monthoffice visit.  1. Coronary artery disease of native artery of native heart with stable angina pectoris ([facility] Patient has had her anginal symptoms come back in the form of pain in the middle of the back that started about 6 weeks ago, but has not had recurrence in  the last 2 weeks or so.  Advised her to use nitroglycerin. S/L NTG was prescribed and explained how to and when to use it and to notify us if there is change in frequency of use.  I do not think she needs a repeat stress test, last test test was done in 2020 which is low risk.  If she has recurrence of angina pectoris that is more frequent or lifestyle-limiting, we could consider cardiac catheterization.  2. Asymptomatic bilateral carotid artery stenosis Very stable carotid artery stenosis.  Will recheck in 6 months.  On physical exam I do not hear any bruit.  3. Pure hypercholesterolemia She is now tolerating Praluent without side effects.  Lipids under excellent control.  4. Paroxysmal atrial fibrillation (HCC) She is maintaining sinus rhythm, she is on anticoagulation due to elevated CHA2DS2-VASc risk score.  No changes in the medications were done today.  External labs reviewed.  I will see her back in 6 months for follow-up.    Adrian Prows, MD, [facility] 12/08/2022,  7:46 AM Office: [phone] Pager: [phone]

## 2022-12-14 ENCOUNTER — Telehealth: Payer: Self-pay | Admitting: Orthopaedic Surgery

## 2022-12-14 ENCOUNTER — Telehealth: Payer: Self-pay

## 2022-12-14 DIAGNOSIS — I25118 Atherosclerotic heart disease of native coronary artery with other forms of angina pectoris: Secondary | ICD-10-CM

## 2022-12-14 DIAGNOSIS — E7800 Pure hypercholesterolemia, unspecified: Secondary | ICD-10-CM

## 2022-12-14 MED ORDER — PRALUENT 75 MG/ML ~~LOC~~ SOAJ: 75.0000 mg | SUBCUTANEOUS | 3 refills | Status: DC

## 2022-12-14 NOTE — Telephone Encounter (Signed)
Called patient, no answer-will try back

## 2022-12-14 NOTE — Telephone Encounter (Signed)
Praluent sent to pharmacy.  

## 2022-12-14 NOTE — Telephone Encounter (Signed)
Pt called requesting a call back. She has a medical question. Asking for Genia Del or Autumn H to call her at [phone].

## 2022-12-15 ENCOUNTER — Other Ambulatory Visit: Payer: Self-pay

## 2022-12-15 DIAGNOSIS — Z96652 Presence of left artificial knee joint: Secondary | ICD-10-CM

## 2022-12-15 DIAGNOSIS — M1711 Unilateral primary osteoarthritis, right knee: Secondary | ICD-10-CM

## 2022-12-15 DIAGNOSIS — M545 Low back pain, unspecified: Secondary | ICD-10-CM

## 2022-12-15 MED ORDER — EZETIMIBE-SIMVASTATIN 10-40 MG PO TABS: 1.0000 | ORAL_TABLET | Freq: Every day | ORAL | 1 refills | Status: DC

## 2022-12-15 NOTE — Telephone Encounter (Signed)
Requesting PT, patient aware referral was sent and they will call her to set up an appt

## 2022-12-16 ENCOUNTER — Other Ambulatory Visit: Payer: Self-pay

## 2022-12-16 DIAGNOSIS — I480 Paroxysmal atrial fibrillation: Secondary | ICD-10-CM

## 2022-12-16 MED ORDER — APIXABAN 5 MG PO TABS: 5.0000 mg | ORAL_TABLET | Freq: Two times a day (BID) | ORAL | 3 refills | Status: DC

## 2022-12-21 ENCOUNTER — Encounter: Payer: Self-pay | Admitting: Physical Therapy

## 2022-12-21 ENCOUNTER — Other Ambulatory Visit: Payer: Self-pay

## 2022-12-21 ENCOUNTER — Ambulatory Visit: Payer: Medicare Other | Admitting: Physical Therapy

## 2022-12-21 DIAGNOSIS — R2689 Other abnormalities of gait and mobility: Secondary | ICD-10-CM

## 2022-12-21 DIAGNOSIS — M5459 Other low back pain: Secondary | ICD-10-CM

## 2022-12-21 DIAGNOSIS — M25562 Pain in left knee: Secondary | ICD-10-CM | POA: Diagnosis not present

## 2022-12-21 DIAGNOSIS — M6281 Muscle weakness (generalized): Secondary | ICD-10-CM

## 2022-12-21 NOTE — Therapy (Addendum)
[facility] Physical Therapy [address] [place], [place], [address] Phone: [phone]   Fax:  [phone]  Patient Details  Name: Erica Frank MRN: [id number] Date of Birth: 10/20/1941 Referring Provider:  Kathryne Hitch*  Encounter Date: 12/21/2022    OUTPATIENT PHYSICAL THERAPY Evaluation Knee and back    Patient Name: Erica Frank MRN: [id number] DOB:[birth year], 82 y.o., female Today's Date: 12/21/2022   END OF SESSION:   PT End of Session - 12/21/22 1007     Visit Number 1    Number of Visits 12    Date for PT Re-Evaluation 02/01/23    Authorization Type UHC MCR    Progress Note Due on Visit 10    PT Start Time 0859    PT Stop Time 0935    PT Time Calculation (min) 36 min    Activity Tolerance Patient tolerated treatment well    Behavior During Therapy [facility] for tasks assessed/performed              Past Medical History:  Diagnosis Date   Anginal pain (HCC) 1989   Anxiety    Arthritis    Atherosclerosis    Atrial fibrillation (HCC)    Coronary artery disease    Dysrhythmia    a-fib   Elevated liver enzymes    GERD (gastroesophageal reflux disease)    Heart attack (HCC) 2020   Heart disease    High cholesterol    Hypertension    NSTEMI (non-ST elevated myocardial infarction) (HCC) 07/09/2019   Past Surgical History:  Procedure Laterality Date   ABDOMINAL HYSTERECTOMY  1983   for endometriosis with Burch   ANGIOPLASTY     ANGIOPLASTY     BACK SURGERY     1990   CARDIAC SURGERY     Catheterization   CORONARY STENT INTERVENTION N/A 07/09/2019   Procedure: CORONARY STENT INTERVENTION;  Surgeon: Yates Decamp, MD;  [facility]: [facility];  Service: Cardiovascular;  Laterality: N/A;   LEFT HEART CATH AND CORONARY ANGIOGRAPHY N/A 07/09/2019   Procedure: LEFT HEART CATH AND CORONARY ANGIOGRAPHY and possible intervention;  Surgeon: Yates Decamp, MD;  Location: [facility] INV[facility] CV LAB;  Service: Cardiovascular;   Laterality: N/A;  4: or 4:30 PM today   LUMBAR LAMINECTOMY/DECOMPRESSION MICRODISCECTOMY Left 02/02/2022   Procedure: Left Lumbar two-three, Lumbar five-Sacral one Sublaminar decompression;  Surgeon: Barnett Abu, MD;  Location: [facility] OR;  Service: Neurosurgery;  Laterality: Left;   NECK SURGERY     TOTAL KNEE ARTHROPLASTY Left 10/07/2020   Procedure: TOTAL KNEE ARTHROPLASTY;  Surgeon: Durene Romans, MD;  Location: [facility] ORS;  Service: Orthopedics;  Laterality: Left;  70 mins   TOTAL KNEE REVISION Left 05/13/2022   Procedure: SYNOVECTOMY AND LEFT KNEE POLY-LINER EXCHANGE;  Surgeon: Kathryne Hitch, MD;  Location: [facility] OR;  Service: Orthopedics;  Laterality: Left;   Patient Active Problem List   Diagnosis Date Noted   Status post revision of total replacement of left knee 05/13/2022   Degenerative lumbar spinal stenosis 02/02/2022   S/P left TKA 10/07/2020   Left knee OA 10/07/2020   Syncope 01/16/2020   Anemia 01/16/2020   Thrombocytopenia (HCC) 01/16/2020   Paroxysmal atrial fibrillation (HCC) 02/15/2019   Mixed hyperlipidemia 02/15/2019   Bradycardia 02/15/2019   Coronary artery disease involving native coronary artery of native heart with unstable angina pectoris (HCC) 12/20/2018   Dyslipidemia 12/20/2018   Essential hypertension 12/20/2018   PCP: Chilton Greathouse, MD  REFERRING PROVIDER: Kathryne Hitch, MD  REFERRING DIAG: E08.144 (ICD-10-CM) - Status post revision of total knee, left  ONSET DATE: 05/13/2022 TKA revision  THERAPY DIAG:  Other low back pain  Acute pain of left knee  Muscle weakness (generalized)  Other abnormalities of gait and mobility  Rationale for Evaluation and Treatment Rehabilitation  PERTINENT HISTORY: PMHx:  recent back surgery for ruptured disc 3 months ago per patient report, Lt TKA revision 05/13/22 HTN, MI, A-fib, CAD, L lumbar laminectomy, atherosclerosis, angina, HTN,  PRECAUTIONS: None  SUBJECTIVE: She relays recent back surgery  for ruptured disc 3 months ago per patient report, she relays she needs some strengthening for her back and still her Lt knee, along with balance training. She denies N/T burning or radicular symptoms in her legs since surgery.  PAIN:  NPRS scale: back: 0 at rest and 8 at worse, Left knee 3-4 currently Pain location: left knee more in front/medial Pain description: tired, tightness Aggravating factors: housework, vacuuming, knee is worse and stiff in the morning Relieving factors: meds  FALLS:  Has patient fallen in last 6 months? NO  OBJECTIVE: (objective measures completed at initial evaluation unless otherwise dated)  PATIENT SURVEYS:  Eval: FOTO 43% (predicted 43%)                  SENSATION: Eval: WFL   EDEMA: Mild edema Lt knee    MUSCLE LENGTH:   POSTURE:   PALPATION:  LOWER EXTREMITY/LUMBAR ROM:  AROM Eval Left 12/21/22 Right eval 12/21/22       Lumbar flexion WNL          Lumbar extension extension  25%         Lumbar rotation Right  75%         Lumbar rotation left  75%         Knee flexion 110        Knee extension 4 2        (Blank rows = not tested)       LOWER EXTREMITY MMT:  MMT Right Eval 12/21/22 Left eval 12/21/22  Hip flexion 4+ 4+  Hip extension    Hip abduction 4+ 4+  Hip adduction    Hip internal rotation    Hip external rotation    Knee flexion 5 4+  Knee extension 5 4  Ankle dorsiflexion 4 4  Ankle plantarflexion    Ankle inversion    Ankle eversion     (Blank rows = not tested)       FUNCTIONAL TESTS:  Eval:  5 times sit to stand: 9 seconds without UE support Tandem balance: 10-30 seconds SLS 10 seconds bilat    GAIT: Eval: Mild limp, decreased TKE, independent community ambulator  SPECIAL TESTS:  Slump test: negative  SLR test: negative       TODAY'S TREATMENT: Eval: Therapeutic Exercise: HEP creation and review with trial set performed, see below for details     PATIENT EDUCATION:  Education details: PT  POC, HEP Person educated: Patient Education method: Consulting civil engineer, Media planner, Corporate treasurer cues, Verbal cues, and Handouts Education comprehension: verbalized understanding, returned demonstration, and needs further education     HOME EXERCISE PROGRAM:  Access Code: [email or web address] URL: [email or web address] Date: 12/21/2022 Prepared by: Elsie Ra Exercises - Seated Passive Knee Extension  - 3 x daily - 6 x weekly - 1 sets - 20 reps - 5 sec hold - Hooklying Single Knee to Chest Stretch  - 2 x daily - 6 x weekly - 1 sets - 3  reps - 30 sec hold - Supine Lower Trunk Rotation  - 2 x daily - 6 x weekly - 1 sets - 10 reps - 5 sec hold - Supine Bridge  - 2 x daily - 6 x weekly - 1-2 sets - 10 reps - 5 hold - Seated Knee Flexion AAROM  - 2 x daily - 7 x weekly - 10 reps - 5 seconds hold - Deadlift with Resistance  - 2 x daily - 6 x weekly - 1-2 sets - 10 reps- Standing Lumbar Extension at [place]  - 2 x daily - 6 x weekly - 1 sets - 10 reps - 5 sec hold - Standing Tandem Balance with Counter Support  - 2 x daily - 6 x weekly - 1 sets - 3 reps - 30 hold - Standing Single Leg Stance with Unilateral Counter Support  - 2 x daily - 6 x weekly - 1 sets - 5 reps - 10 hold  ASSESSMENT:   CLINICAL IMPRESSION: Betty was referred to PT for Eval and Treat, back and knees and MD note states to help with HEP and strengthening/ROM help/exercises. Of note she had a recent lumbar surgery 3 months ago for ruptured disc she reports. Functional deficits noted in lumbar ROM, decreased core/lumbar and leg strength, and decreased balance. She will benefit from skilled PT to address these deficits.   OBJECTIVE IMPAIRMENTS Abnormal gait, decreased activity tolerance, decreased balance, decreased mobility, difficulty walking, decreased ROM, decreased strength, impaired flexibility, and pain.    ACTIVITY LIMITATIONS lifting, bending, sitting, standing, sleeping, stairs, transfers, and dressing    PARTICIPATION LIMITATIONS: cleaning, driving, and shopping   PERSONAL FACTORS   recent lumbar surgery, HTN, MI, A-fib, CAD, L lumbar laminectomy, atherosclerosis, angina, HTN,  are also affecting patient's functional outcome.    REHAB POTENTIAL: Good   CLINICAL DECISION MAKING: Stable/uncomplicated   EVALUATION COMPLEXITY: Low     GOALS: Goals reviewed with patient? Yes   SHORT TERM GOALS: Target date: 01/18/2023    Patient will demonstrate independent use of initial home exercise program to maintain progress from in clinic treatments. Goal status:NEW         Long term PT goals (target dates for all long term goals are 6 weeks  02/01/2023  Patient will demonstrate/report pain at worst less than or equal to 2/10 with ADL's.  Goal status: NEW   2. Patient will maintain FOTO outcome > or = 43 % to indicate reduced disability due to condition. Goal status: NEW  3. Pt will improve lumbar ROM to [facility] for improved mobility  Goal status: NEW   4. Pt will improve leg strength grossly to 5/5 bilat for functional strength  Goal status: NEW   5. . Pt will improve left knee extension to 2 degrees or less to improve gait  Goal status: NEW  6. Pt will improve SLS time to 15 seconds on avg to show improved balance  Goal status: NEW  PLAN: PT FREQUENCY: 1-2 times per week    PT DURATION: 6-8 weeks   PLANNED INTERVENTIONS (unless contraindicated): aquatic PT, Canalith repositioning, cryotherapy, Electrical stimulation, Iontophoresis with 4 mg/ml dexamethasome, Moist heat, traction, Ultrasound, gait training, Therapeutic exercise, balance training, neuromuscular re-education, patient/family education, prosthetic training, manual techniques, passive ROM, dry needling, taping, vasopnuematic device, vestibular, spinal manipulations, joint manipulations   PLAN FOR NEXT SESSION: review HEP, lumbar mobility, balance   Elsie Ra, PT, DPT 12/21/22 10:08 AM

## 2022-12-27 ENCOUNTER — Encounter: Payer: Self-pay | Admitting: Physical Therapy

## 2022-12-27 ENCOUNTER — Ambulatory Visit: Payer: Medicare Other | Admitting: Physical Therapy

## 2022-12-27 DIAGNOSIS — M25562 Pain in left knee: Secondary | ICD-10-CM | POA: Diagnosis not present

## 2022-12-27 DIAGNOSIS — M5459 Other low back pain: Secondary | ICD-10-CM

## 2022-12-27 DIAGNOSIS — M6281 Muscle weakness (generalized): Secondary | ICD-10-CM | POA: Diagnosis not present

## 2022-12-27 DIAGNOSIS — R2689 Other abnormalities of gait and mobility: Secondary | ICD-10-CM

## 2022-12-27 NOTE — Therapy (Signed)
[facility] Physical Therapy Clinic [address] [place], [place], [address] Phone: [phone]   Fax:  [phone]  Patient Details  Name: Erica Frank MRN: [id number] Date of Birth: [birth year] Referring Provider:  Kathryne Hitch*  Encounter Date: 12/27/2022    OUTPATIENT PHYSICAL THERAPY TREATMENT NOTE:   Knee and Back    Patient Name: Erica Frank MRN: [id number] DOB:[birth year], 82 y.o., female Today's Date: 12/27/2022   END OF SESSION:   PT End of Session - 12/27/22 1209     Visit Number 2    Number of Visits 12    Date for PT Re-Evaluation 02/01/23    Authorization Type UHC MCR    Progress Note Due on Visit 10    PT Start Time 1150    PT Stop Time 1235    PT Time Calculation (min) 45 min    Activity Tolerance Patient tolerated treatment well    Behavior During Therapy [facility] for tasks assessed/performed               Past Medical History:  Diagnosis Date   Anginal pain (HCC) 1989   Anxiety    Arthritis    Atherosclerosis    Atrial fibrillation (HCC)    Coronary artery disease    Dysrhythmia    a-fib   Elevated liver enzymes    GERD (gastroesophageal reflux disease)    Heart attack (HCC) 2020   Heart disease    High cholesterol    Hypertension    NSTEMI (non-ST elevated myocardial infarction) (HCC) 07/09/2019   Past Surgical History:  Procedure Laterality Date   ABDOMINAL HYSTERECTOMY  1983   for endometriosis with Burch   ANGIOPLASTY     ANGIOPLASTY     BACK SURGERY     1990   CARDIAC SURGERY     Catheterization   CORONARY STENT INTERVENTION N/A 07/09/2019   Procedure: CORONARY STENT INTERVENTION;  Surgeon: Yates Decamp, MD;  [facility];  Service: Cardiovascular;  Laterality: N/A;   LEFT HEART CATH AND CORONARY ANGIOGRAPHY N/A 07/09/2019   Procedure: LEFT HEART CATH AND CORONARY ANGIOGRAPHY and possible intervention;  Surgeon: Yates Decamp, MD;  [facility]: [facility];   Service: Cardiovascular;  Laterality: N/A;  4: or 4:30 PM today   LUMBAR LAMINECTOMY/DECOMPRESSION MICRODISCECTOMY Left 02/02/2022   Procedure: Left Lumbar two-three, Lumbar five-Sacral one Sublaminar decompression;  Surgeon: Barnett Abu, MD;  Location: [facility] OR;  Service: Neurosurgery;  Laterality: Left;   NECK SURGERY     TOTAL KNEE ARTHROPLASTY Left 10/07/2020   Procedure: TOTAL KNEE ARTHROPLASTY;  Surgeon: Durene Romans, MD;  Location: [facility];  Service: Orthopedics;  Laterality: Left;  70 mins   TOTAL KNEE REVISION Left 05/13/2022   Procedure: SYNOVECTOMY AND LEFT KNEE POLY-LINER EXCHANGE;  Surgeon: Kathryne Hitch, MD;  Location: [facility] OR;  Service: Orthopedics;  Laterality: Left;   Patient Active Problem List   Diagnosis Date Noted   Status post revision of total replacement of left knee 05/13/2022   Degenerative lumbar spinal stenosis 02/02/2022   S/P left TKA 10/07/2020   Left knee OA 10/07/2020   Syncope 01/16/2020   Anemia 01/16/2020   Thrombocytopenia (HCC) 01/16/2020   Paroxysmal atrial fibrillation (HCC) 02/15/2019   Mixed hyperlipidemia 02/15/2019   Bradycardia 02/15/2019   Coronary artery disease involving native coronary artery of native heart with unstable angina pectoris (HCC) 12/20/2018   Dyslipidemia 12/20/2018   Essential hypertension 12/20/2018   PCP: Chilton Greathouse, MD  REFERRING PROVIDER: Mcarthur Rossetti, MD  REFERRING DIAG: [phone] (ICD-10-CM) - Status post revision of total knee, left  ONSET DATE: 05/13/2022 TKA revision  THERAPY DIAG:  Other low back pain  Acute pain of left knee  Muscle weakness (generalized)  Other abnormalities of gait and mobility  Rationale for Evaluation and Treatment Rehabilitation  PERTINENT HISTORY: PMHx:  recent back surgery for ruptured disc 3 months ago per patient report, Lt TKA revision 05/13/22 HTN, MI, A-fib, CAD, L lumbar laminectomy, atherosclerosis, angina, HTN,  PRECAUTIONS: None  SUBJECTIVE: Pt  arriving today reporting left knee pain of 5-6/10 pain and 2/10 in her low back.   PAIN:  NPRS scale: 5-6/10 in left knee, 2/10 in low back Pain location: left knee more in front/medial Pain description: tired, tightness Aggravating factors: housework, vacuuming, knee is worse and stiff in the morning Relieving factors: meds  FALLS:  Has patient fallen in last 6 months? NO  OBJECTIVE: (objective measures completed at initial evaluation unless otherwise dated)  PATIENT SURVEYS:  Eval: FOTO 43% (predicted 43%)                  SENSATION: Eval: WFL   EDEMA: Mild edema Lt knee    MUSCLE LENGTH:   POSTURE:   PALPATION:  LOWER EXTREMITY/LUMBAR ROM:  AROM Eval Left 12/21/22 Right eval 12/21/22 Left 12/27/22      Lumbar flexion WNL          Lumbar extension extension  25%         Lumbar rotation Right  75%         Lumbar rotation left  75%         Knee flexion 110  110      Knee extension 4 2 4        (Blank rows = not tested)       LOWER EXTREMITY MMT:  MMT Right Eval 12/21/22 Left eval 12/21/22  Hip flexion 4+ 4+  Hip extension    Hip abduction 4+ 4+  Hip adduction    Hip internal rotation    Hip external rotation    Knee flexion 5 4+  Knee extension 5 4  Ankle dorsiflexion 4 4  Ankle plantarflexion    Ankle inversion    Ankle eversion     (Blank rows = not tested)       FUNCTIONAL TESTS:  Eval:  5 times sit to stand: 9 seconds without UE support Tandem balance: 10-30 seconds SLS 10 seconds bilat    GAIT: Eval: Mild limp, decreased TKE, independent community ambulator  SPECIAL TESTS:  Slump test: negative  SLR test: negative       TODAY'S TREATMENT: 12/27/22:  TherEx:  Sit to stand: x 10 Wall extension on elbows x 10 holding 5 sec Sitting lumbar flexion rolling green physio-ball forward x 5 holding 10 seconds Sitting QL stretch rolling green physio-ball to each side holding 5 sec x 4  Supine: SAQ over bolster x 10 holding 5 sec Supine  trunk rotation; x 3 to each side holding 20 sec Supine marching: (instruction for abdominal activation) x 20 Supine piriformis stretch: x 3 on each side holding 20 sec SKTC x 2 each side holding 20 sec Manual STM: to lumbar paraspinals PROM: to left knee in prone position Modalities:  Moist heat to low back following STM x 5 minutes     Eval: Therapeutic Exercise: HEP creation and review with trial set performed, see below for details  PATIENT EDUCATION:  Education details: PT POC, HEP Person educated: Patient Education method: Explanation, Demonstration, Actor cues, Verbal cues, and Handouts Education comprehension: verbalized understanding, returned demonstration, and needs further education     HOME EXERCISE PROGRAM:  Access Code: [email or web address] URL: [email or web address] Date: 12/21/2022 Prepared by: Ivery Quale Exercises - Seated Passive Knee Extension  - 3 x daily - 6 x weekly - 1 sets - 20 reps - 5 sec hold - Hooklying Single Knee to Chest Stretch  - 2 x daily - 6 x weekly - 1 sets - 3 reps - 30 sec hold - Supine Lower Trunk Rotation  - 2 x daily - 6 x weekly - 1 sets - 10 reps - 5 sec hold - Supine Bridge  - 2 x daily - 6 x weekly - 1-2 sets - 10 reps - 5 hold - Seated Knee Flexion AAROM  - 2 x daily - 7 x weekly - 10 reps - 5 seconds hold - Deadlift with Resistance  - 2 x daily - 6 x weekly - 1-2 sets - 10 reps- Standing Lumbar Extension at Wall - Forearms  - 2 x daily - 6 x weekly - 1 sets - 10 reps - 5 sec hold - Standing Tandem Balance with Counter Support  - 2 x daily - 6 x weekly - 1 sets - 3 reps - 30 hold - Standing Single Leg Stance with Unilateral Counter Support  - 2 x daily - 6 x weekly - 1 sets - 5 reps - 10 hold    ASSESSMENT:   CLINICAL IMPRESSION: Pt reporting more pain in her left knee today than her low back. Pt tolerating lumbar mobility and strengthening exercises well today.Pt's initial HEP was reviewed. Pt with good response to  soft tissue mobs.  Continue skilled PT to maximize pt's function.   OBJECTIVE IMPAIRMENTS Abnormal gait, decreased activity tolerance, decreased balance, decreased mobility, difficulty walking, decreased ROM, decreased strength, impaired flexibility, and pain.    ACTIVITY LIMITATIONS lifting, bending, sitting, standing, sleeping, stairs, transfers, and dressing   PARTICIPATION LIMITATIONS: cleaning, driving, and shopping   PERSONAL FACTORS   recent lumbar surgery, HTN, MI, A-fib, CAD, L lumbar laminectomy, atherosclerosis, angina, HTN,  are also affecting patient's functional outcome.    REHAB POTENTIAL: Good   CLINICAL DECISION MAKING: Stable/uncomplicated   EVALUATION COMPLEXITY: Low     GOALS: Goals reviewed with patient? Yes   SHORT TERM GOALS: Target date: 01/18/2023    Patient will demonstrate independent use of initial home exercise program to maintain progress from in clinic treatments. Goal status: On-going 12/27/22         Long term PT goals (target dates for all long term goals are 6 weeks  02/01/2023  Patient will demonstrate/report pain at worst less than or equal to 2/10 with ADL's.  Goal status: NEW   2. Patient will maintain FOTO outcome > or = 43 % to indicate reduced disability due to condition. Goal status: NEW  3. Pt will improve lumbar ROM to [facility] for improved mobility  Goal status: NEW   4. Pt will improve leg strength grossly to 5/5 bilat for functional strength  Goal status: NEW   5. . Pt will improve left knee extension to 2 degrees or less to improve gait  Goal status: NEW  6. Pt will improve SLS time to 15 seconds on avg to show improved balance  Goal status: NEW   PLAN: PT FREQUENCY: 1-2 times per week  PT DURATION: 6-8 weeks   PLANNED INTERVENTIONS (unless contraindicated): aquatic PT, Canalith repositioning, cryotherapy, Electrical stimulation, Iontophoresis with 4 mg/ml dexamethasome, Moist heat, traction, Ultrasound, gait training,  Therapeutic exercise, balance training, neuromuscular re-education, patient/family education, prosthetic training, manual techniques, passive ROM, dry needling, taping, vasopnuematic device, vestibular, spinal manipulations, joint manipulations   PLAN FOR NEXT SESSION: Progress lumbar mobility, dynamic balance, STM   Kearney Hard, PT, MPT 12/27/22 12:13 PM   12/27/22 12:13 PM

## 2022-12-29 ENCOUNTER — Encounter: Payer: Self-pay | Admitting: Physical Therapy

## 2022-12-29 ENCOUNTER — Ambulatory Visit: Payer: Medicare Other | Admitting: Physical Therapy

## 2022-12-29 ENCOUNTER — Other Ambulatory Visit: Payer: Self-pay | Admitting: Cardiology

## 2022-12-29 DIAGNOSIS — I10 Essential (primary) hypertension: Secondary | ICD-10-CM

## 2022-12-29 DIAGNOSIS — M5459 Other low back pain: Secondary | ICD-10-CM | POA: Diagnosis not present

## 2022-12-29 DIAGNOSIS — R2689 Other abnormalities of gait and mobility: Secondary | ICD-10-CM

## 2022-12-29 DIAGNOSIS — M25562 Pain in left knee: Secondary | ICD-10-CM | POA: Diagnosis not present

## 2022-12-29 DIAGNOSIS — M6281 Muscle weakness (generalized): Secondary | ICD-10-CM

## 2022-12-29 NOTE — Therapy (Signed)
[facility] [address] [place], [place], [address] Phone: [phone]   Fax:  [phone]  Patient Details  Name: Erica Frank MRN: [id number] Date of Birth: 04/08/1941 Referring Provider:  Kathryne Hitch*  Encounter Date: 12/29/2022    OUTPATIENT PHYSICAL THERAPY TREATMENT NOTE:   Knee and Back    Patient Name: Erica Frank MRN: [id number] DOB:1941-04-17, 82 y.o., female Today's Date: 12/29/2022   END OF SESSION:   PT End of Session - 12/29/22 1143     Visit Number 3    Number of Visits 12    Date for PT Re-Evaluation 02/01/23    Authorization Type UHC MCR    Progress Note Due on Visit 10    PT Start Time 1055    PT Stop Time 1145    PT Time Calculation (min) 50 min    Activity Tolerance Patient tolerated treatment well    Behavior During Therapy [facility] for tasks assessed/performed                Past Medical History:  Diagnosis Date   Anginal pain (HCC) 1989   Anxiety    Arthritis    Atherosclerosis    Atrial fibrillation (HCC)    Coronary artery disease    Dysrhythmia    a-fib   Elevated liver enzymes    GERD (gastroesophageal reflux disease)    Heart attack (HCC) 2020   Heart disease    High cholesterol    Hypertension    NSTEMI (non-ST elevated myocardial infarction) (HCC) 07/09/2019   Past Surgical History:  Procedure Laterality Date   ABDOMINAL HYSTERECTOMY  1983   for endometriosis with Burch   ANGIOPLASTY     ANGIOPLASTY     BACK SURGERY     1990   CARDIAC SURGERY     Catheterization   CORONARY STENT INTERVENTION N/A 07/09/2019   Procedure: CORONARY STENT INTERVENTION;  Surgeon: Yates Decamp, MD;  [facility]: [facility] INV[facility];  Service: Cardiovascular;  Laterality: N/A;   LEFT HEART CATH AND CORONARY ANGIOGRAPHY N/A 07/09/2019   Procedure: LEFT HEART CATH AND CORONARY ANGIOGRAPHY and possible intervention;  Surgeon: Yates Decamp, MD;  Location: [facility] INV[facility] CV LAB;   Service: Cardiovascular;  Laterality: N/A;  4: or 4:30 PM today   LUMBAR LAMINECTOMY/DECOMPRESSION MICRODISCECTOMY Left 02/02/2022   Procedure: Left Lumbar two-three, Lumbar five-Sacral one Sublaminar decompression;  Surgeon: Barnett Abu, MD;  Location: [facility] OR;  Service: Neurosurgery;  Laterality: Left;   NECK SURGERY     TOTAL KNEE ARTHROPLASTY Left 10/07/2020   Procedure: TOTAL KNEE ARTHROPLASTY;  Surgeon: Durene Romans, MD;  Location: [facility] ORS;  Service: Orthopedics;  Laterality: Left;  70 mins   TOTAL KNEE REVISION Left 05/13/2022   Procedure: SYNOVECTOMY AND LEFT KNEE POLY-LINER EXCHANGE;  Surgeon: Kathryne Hitch, MD;  Location: [facility] OR;  Service: Orthopedics;  Laterality: Left;   Patient Active Problem List   Diagnosis Date Noted   Status post revision of total replacement of left knee 05/13/2022   Degenerative lumbar spinal stenosis 02/02/2022   S/P left TKA 10/07/2020   Left knee OA 10/07/2020   Syncope 01/16/2020   Anemia 01/16/2020   Thrombocytopenia (HCC) 01/16/2020   Paroxysmal atrial fibrillation (HCC) 02/15/2019   Mixed hyperlipidemia 02/15/2019   Bradycardia 02/15/2019   Coronary artery disease involving native coronary artery of native heart with unstable angina pectoris (HCC) 12/20/2018   Dyslipidemia 12/20/2018   Essential hypertension 12/20/2018   PCP: Chilton Greathouse, MD  REFERRING PROVIDER: Mcarthur Rossetti, MD  REFERRING DIAG: [phone] (ICD-10-CM) - Status post revision of total knee, left  ONSET DATE: 05/13/2022 TKA revision  THERAPY DIAG:  Other low back pain  Acute pain of left knee  Muscle weakness (generalized)  Other abnormalities of gait and mobility  Rationale for Evaluation and Treatment Rehabilitation  PERTINENT HISTORY: PMHx:  recent back surgery for ruptured disc 3 months ago per patient report, Lt TKA revision 05/13/22 HTN, MI, A-fib, CAD, L lumbar laminectomy, atherosclerosis, angina, HTN,  PRECAUTIONS: None  SUBJECTIVE: She  reports her pain is doing better today. She hasn't been vacuuming which she reports is something that aggravates it, but has seen some improvements overall.  PAIN:  NPRS scale: 2/10 in left knee, 0/10 in low back Pain location: left knee more in front/medial Pain description: tired, tightness Aggravating factors: housework, vacuuming, knee is worse and stiff in the morning Relieving factors: meds  FALLS:  Has patient fallen in last 6 months? NO  OBJECTIVE: (objective measures completed at initial evaluation unless otherwise dated)  PATIENT SURVEYS:  Eval: FOTO 43% (predicted 43%)                  SENSATION: Eval: WFL   EDEMA: Mild edema Lt knee    MUSCLE LENGTH:   POSTURE:   PALPATION:  LOWER EXTREMITY/LUMBAR ROM:  AROM Eval Left 12/21/22 Right eval 12/21/22 Left 12/27/22      Lumbar flexion WNL          Lumbar extension extension  25%         Lumbar rotation Right  75%         Lumbar rotation left  75%         Knee flexion 110  110      Knee extension 4 2 4        (Blank rows = not tested)       LOWER EXTREMITY MMT:  MMT Right Eval 12/21/22 Left eval 12/21/22  Hip flexion 4+ 4+  Hip extension    Hip abduction 4+ 4+  Hip adduction    Hip internal rotation    Hip external rotation    Knee flexion 5 4+  Knee extension 5 4  Ankle dorsiflexion 4 4  Ankle plantarflexion    Ankle inversion    Ankle eversion     (Blank rows = not tested)       FUNCTIONAL TESTS:  Eval:  5 times sit to stand: 9 seconds without UE support Tandem balance: 10-30 seconds SLS 10 seconds bilat    GAIT: Eval: Mild limp, decreased TKE, independent community ambulator  SPECIAL TESTS:  Slump test: negative  SLR test: negative       TODAY'S TREATMENT: 12/29/22:  TherEx:  Nu step seat 5 level 5 x52min  Deadlift with blue resistance band under feet 2x10 (cues to keep arms straight) Sit to stand: x 10 without UE support Wall extension on elbows x 10 holding 5 sec Sitting  lumbar flexion rolling green physio-ball forward x 5 holding 10 seconds Sitting QL stretch rolling green physio-ball to each side holding 5 sec x 4  Supine: SAQ over bolster 2# x 10 holding 5 sec  Supine: SLR 2# 2x10 slow eccentrics Seated LAQ 2# 2x10  Leg press DL #75 x15, second set #100 x15, single leg #62 2x15 on left, #68 2x15 on right  Tandem walking x3 round trips in bars Standing marches x3 round trips in bars SLS hold for 10  sec x3 each side in bars  Modalities:  Moist heat to low back following session x 5 minutes (not included in treatment time)  12/27/22:  TherEx:  Sit to stand: x 10 Wall extension on elbows x 10 holding 5 sec Sitting lumbar flexion rolling green physio-ball forward x 5 holding 10 seconds Sitting QL stretch rolling green physio-ball to each side holding 5 sec x 4  Supine: SAQ over bolster x 10 holding 5 sec Supine trunk rotation; x 3 to each side holding 20 sec Supine marching: (instruction for abdominal activation) x 20 Supine piriformis stretch: x 3 on each side holding 20 sec SKTC x 2 each side holding 20 sec Manual STM: to lumbar paraspinals PROM: to left knee in prone position Modalities:  Moist heat to low back following STM x 5 minutes       PATIENT EDUCATION:  Education details: PT POC, HEP Person educated: Patient Education method: Consulting civil engineer, Media planner, Corporate treasurer cues, Verbal cues, and Handouts Education comprehension: verbalized understanding, returned demonstration, and needs further education     HOME EXERCISE PROGRAM:  Access Code: [email or web address] URL: [email or web address] Date: 12/21/2022 Prepared by: Elsie Ra Exercises - Seated Passive Knee Extension  - 3 x daily - 6 x weekly - 1 sets - 20 reps - 5 sec hold - Hooklying Single Knee to Chest Stretch  - 2 x daily - 6 x weekly - 1 sets - 3 reps - 30 sec hold - Supine Lower Trunk Rotation  - 2 x daily - 6 x weekly - 1 sets - 10 reps - 5 sec hold - Supine Bridge   - 2 x daily - 6 x weekly - 1-2 sets - 10 reps - 5 hold - Seated Knee Flexion AAROM  - 2 x daily - 7 x weekly - 10 reps - 5 seconds hold - Deadlift with Resistance  - 2 x daily - 6 x weekly - 1-2 sets - 10 reps- Standing Lumbar Extension at Wall - Forearms  - 2 x daily - 6 x weekly - 1 sets - 10 reps - 5 sec hold - Standing Tandem Balance with Counter Support  - 2 x daily - 6 x weekly - 1 sets - 3 reps - 30 hold - Standing Single Leg Stance with Unilateral Counter Support  - 2 x daily - 6 x weekly - 1 sets - 5 reps - 10 hold    ASSESSMENT:   CLINICAL IMPRESSION: She showed good tolerance to additional strength exercises today. She had decreased pain so we worked more on strength and balance. Lumbar ROM is coming along nicely post surgery and her overall pain has decreased in the back and knee. She will continue to benefit from skilled PT to work more on ROM, strength, and balance to improve functionally.   OBJECTIVE IMPAIRMENTS Abnormal gait, decreased activity tolerance, decreased balance, decreased mobility, difficulty walking, decreased ROM, decreased strength, impaired flexibility, and pain.    ACTIVITY LIMITATIONS lifting, bending, sitting, standing, sleeping, stairs, transfers, and dressing   PARTICIPATION LIMITATIONS: cleaning, driving, and shopping   PERSONAL FACTORS   recent lumbar surgery, HTN, MI, A-fib, CAD, L lumbar laminectomy, atherosclerosis, angina, HTN,  are also affecting patient's functional outcome.    REHAB POTENTIAL: Good   CLINICAL DECISION MAKING: Stable/uncomplicated   EVALUATION COMPLEXITY: Low     GOALS: Goals reviewed with patient? Yes   SHORT TERM GOALS: Target date: 01/18/2023    Patient will demonstrate independent use of initial home exercise program to maintain  progress from in clinic treatments. Goal status: On-going 12/27/22         Long term PT goals (target dates for all long term goals are 6 weeks  02/01/2023  Patient will demonstrate/report  pain at worst less than or equal to 2/10 with ADL's.  Goal status: NEW   2. Patient will maintain FOTO outcome > or = 43 % to indicate reduced disability due to condition. Goal status: NEW  3. Pt will improve lumbar ROM to [facility] for improved mobility  Goal status: NEW   4. Pt will improve leg strength grossly to 5/5 bilat for functional strength  Goal status: NEW   5. . Pt will improve left knee extension to 2 degrees or less to improve gait  Goal status: NEW  6. Pt will improve SLS time to 15 seconds on avg to show improved balance  Goal status: NEW   PLAN: PT FREQUENCY: 1-2 times per week    PT DURATION: 6-8 weeks   PLANNED INTERVENTIONS (unless contraindicated): aquatic PT, Canalith repositioning, cryotherapy, Electrical stimulation, Iontophoresis with 4 mg/ml dexamethasome, Moist heat, traction, Ultrasound, gait training, Therapeutic exercise, balance training, neuromuscular re-education, patient/family education, prosthetic training, manual techniques, passive ROM, dry needling, taping, vasopnuematic device, vestibular, spinal manipulations, joint manipulations   PLAN FOR NEXT SESSION: Progress lumbar mobility, dynamic balance, STM, general strength.    Lafayette Dunlevy, SPT  12/29/22 11:47 AM

## 2023-01-03 ENCOUNTER — Encounter: Payer: Medicare Other | Admitting: Physical Therapy

## 2023-01-05 ENCOUNTER — Ambulatory Visit: Payer: Medicare Other | Admitting: Physical Therapy

## 2023-01-05 ENCOUNTER — Encounter: Payer: Self-pay | Admitting: Physical Therapy

## 2023-01-05 ENCOUNTER — Encounter: Payer: Medicare Other | Admitting: Physical Therapy

## 2023-01-05 DIAGNOSIS — M5459 Other low back pain: Secondary | ICD-10-CM | POA: Diagnosis not present

## 2023-01-05 DIAGNOSIS — R2689 Other abnormalities of gait and mobility: Secondary | ICD-10-CM

## 2023-01-05 DIAGNOSIS — M25562 Pain in left knee: Secondary | ICD-10-CM

## 2023-01-05 DIAGNOSIS — M6281 Muscle weakness (generalized): Secondary | ICD-10-CM

## 2023-01-05 NOTE — Therapy (Signed)
[facility] [address] [place], [place], [address] Phone: [phone]   Fax:  [phone]  Patient Details  Name: Kailoni Vahle MRN: [id number] Date of Birth: 1941/06/09 Referring Provider:  Mcarthur Rossetti*  Encounter Date: 01/05/2023    OUTPATIENT PHYSICAL THERAPY TREATMENT NOTE:   Knee and Back    Patient Name: Nala Kachel MRN: [id number] DOB:Jan 24, 1941, 82 y.o., female Today's Date: 01/05/2023   END OF SESSION:   PT End of Session - 01/05/23 1516     Visit Number 4    Number of Visits 12    Date for PT Re-Evaluation 02/01/23    Authorization Type UHC MCR    Progress Note Due on Visit 10    PT Start Time 7902    PT Stop Time 1550    PT Time Calculation (min) 39 min    Activity Tolerance Patient tolerated treatment well    Behavior During Therapy [facility] for tasks assessed/performed                 Past Medical History:  Diagnosis Date   Anginal pain ([place]) 1989   Anxiety    Arthritis    Atherosclerosis    Atrial fibrillation ([place])    Coronary artery disease    Dysrhythmia    a-fib   Elevated liver enzymes    GERD (gastroesophageal reflux disease)    Heart attack ([place]) 2020   Heart disease    High cholesterol    Hypertension    NSTEMI (non-ST elevated myocardial infarction) ([place]) 07/09/2019   Past Surgical History:  Procedure Laterality Date   ABDOMINAL HYSTERECTOMY  1983   for endometriosis with Burch   ANGIOPLASTY     ANGIOPLASTY     BACK SURGERY     1990   CARDIAC SURGERY     Catheterization   CORONARY STENT INTERVENTION N/A 07/09/2019   Procedure: CORONARY STENT INTERVENTION;  Surgeon: Adrian Prows, MD;  Location: [place] CV LAB;  Service: Cardiovascular;  Laterality: N/A;   LEFT HEART CATH AND CORONARY ANGIOGRAPHY N/A 07/09/2019   Procedure: LEFT HEART CATH AND CORONARY ANGIOGRAPHY and possible intervention;  Surgeon: Adrian Prows, MD;  Location: [place] CV LAB;   Service: Cardiovascular;  Laterality: N/A;  4: or 4:30 PM today   LUMBAR LAMINECTOMY/DECOMPRESSION MICRODISCECTOMY Left 02/02/2022   Procedure: Left Lumbar two-three, Lumbar five-Sacral one Sublaminar decompression;  Surgeon: Kristeen Miss, MD;  Location: [place];  Service: Neurosurgery;  Laterality: Left;   NECK SURGERY     TOTAL KNEE ARTHROPLASTY Left 10/07/2020   Procedure: TOTAL KNEE ARTHROPLASTY;  Surgeon: Paralee Cancel, MD;  Location: [facility];  Service: Orthopedics;  Laterality: Left;  70 mins   TOTAL KNEE REVISION Left 05/13/2022   Procedure: SYNOVECTOMY AND LEFT KNEE POLY-LINER EXCHANGE;  Surgeon: Mcarthur Rossetti, MD;  Location: [place];  Service: Orthopedics;  Laterality: Left;   Patient Active Problem List   Diagnosis Date Noted   Status post revision of total replacement of left knee 05/13/2022   Degenerative lumbar spinal stenosis 02/02/2022   S/P left TKA 10/07/2020   Left knee OA 10/07/2020   Syncope 01/16/2020   Anemia 01/16/2020   Thrombocytopenia ([place]) 01/16/2020   Paroxysmal atrial fibrillation ([place]) 02/15/2019   Mixed hyperlipidemia 02/15/2019   Bradycardia 02/15/2019   Coronary artery disease involving native coronary artery of native heart with unstable angina pectoris ([place]) 12/20/2018   Dyslipidemia 12/20/2018   Essential hypertension 12/20/2018   PCP: Prince Solian,  MD  REFERRING PROVIDER: Mcarthur Rossetti, MD  REFERRING DIAG: [phone] (ICD-10-CM) - Status post revision of total knee, left  ONSET DATE: 05/13/2022 TKA revision  THERAPY DIAG:  Other low back pain  Acute pain of left knee  Muscle weakness (generalized)  Other abnormalities of gait and mobility  Rationale for Evaluation and Treatment Rehabilitation  PERTINENT HISTORY: PMHx:  recent back surgery for ruptured disc 3 months ago per patient report, Lt TKA revision 05/13/22 HTN, MI, A-fib, CAD, L lumbar laminectomy, atherosclerosis, angina, HTN,  PRECAUTIONS: None  SUBJECTIVE: Lt  knee has been bothering her more; back is "okay" today Lt knee is stiff and nothing seems to help  PAIN:  NPRS scale: 2/10 in left knee, 0/10 in low back Pain location: left knee more in front/medial Pain description: tired, tightness Aggravating factors: housework, vacuuming, knee is worse and stiff in the morning Relieving factors: meds  FALLS:  Has patient fallen in last 6 months? NO  OBJECTIVE: (objective measures completed at initial evaluation unless otherwise dated)  PATIENT SURVEYS:  Eval: FOTO 43% (predicted 43%)                  SENSATION: Eval: WFL   EDEMA: Mild edema Lt knee   LOWER EXTREMITY/LUMBAR ROM:  AROM Eval Left 12/21/22 Right eval 12/21/22 Left 12/27/22      Lumbar flexion WNL          Lumbar extension extension  25%         Lumbar rotation Right  75%         Lumbar rotation left  75%         Knee flexion 110  110      Knee extension 4 2 4        (Blank rows = not tested)     LOWER EXTREMITY MMT:  MMT Right Eval 12/21/22 Left eval 12/21/22  Hip flexion 4+ 4+  Hip extension    Hip abduction 4+ 4+  Hip adduction    Hip internal rotation    Hip external rotation    Knee flexion 5 4+  Knee extension 5 4  Ankle dorsiflexion 4 4  Ankle plantarflexion    Ankle inversion    Ankle eversion     (Blank rows = not tested)       FUNCTIONAL TESTS:  Eval:  5 times sit to stand: 9 seconds without UE support Tandem balance: 10-30 seconds SLS 10 seconds bilat    GAIT: Eval: Mild limp, decreased TKE, independent community ambulator  SPECIAL TESTS:  Slump test: negative  SLR test: negative       TODAY'S TREATMENT: 01/05/23 TherEx:  Nu step seat 5 L5 x 6 min; LEs only Supine SKTC 10 x 10 sec hold DKTC 10 x 10 sec hold Bridges 2x10 reps; 5 sec hold Sit to/from stand without UE support x 10 reps  Neuro Re-Ed SLS 2x30 sec bil intermittent UE support Tandem stance 2x30 sec bil intermittent UE support  Self Care Discussed options for pain  including pain psychologist and pain management, pt interested in pain psychologist so provided information on local options Discussed desensitization strategies for Lt knee  12/29/22:  TherEx:  Nu step seat 5 level 5 x39min  Deadlift with blue resistance band under feet 2x10 (cues to keep arms straight) Sit to stand: x 10 without UE support Wall extension on elbows x 10 holding 5 sec Sitting lumbar flexion rolling green physio-ball forward x 5 holding 10 seconds Sitting  QL stretch rolling green physio-ball to each side holding 5 sec x 4  Supine: SAQ over bolster 2# x 10 holding 5 sec  Supine: SLR 2# 2x10 slow eccentrics Seated LAQ 2# 2x10  Leg press DL #36 U44, second set #034 x15, single leg #62 2x15 on left, #68 2x15 on right  Tandem walking x3 round trips in bars Standing marches x3 round trips in bars SLS hold for 10 sec x3 each side in bars  Modalities:  Moist heat to low back following session x 5 minutes (not included in treatment time)  12/27/22:  TherEx:  Sit to stand: x 10 Wall extension on elbows x 10 holding 5 sec Sitting lumbar flexion rolling green physio-ball forward x 5 holding 10 seconds Sitting QL stretch rolling green physio-ball to each side holding 5 sec x 4  Supine: SAQ over bolster x 10 holding 5 sec Supine trunk rotation; x 3 to each side holding 20 sec Supine marching: (instruction for abdominal activation) x 20 Supine piriformis stretch: x 3 on each side holding 20 sec SKTC x 2 each side holding 20 sec Manual STM: to lumbar paraspinals PROM: to left knee in prone position Modalities:  Moist heat to low back following STM x 5 minutes       PATIENT EDUCATION:  Education details: PT POC, HEP Person educated: Patient Education method: Programmer, multimedia, Facilities manager, Actor cues, Verbal cues, and Handouts Education comprehension: verbalized understanding, returned demonstration, and needs further education     HOME EXERCISE PROGRAM:  Access Code:  [email or web address] URL: [email or web address] Date: 12/21/2022 Prepared by: Ivery Quale Exercises - Seated Passive Knee Extension  - 3 x daily - 6 x weekly - 1 sets - 20 reps - 5 sec hold - Hooklying Single Knee to Chest Stretch  - 2 x daily - 6 x weekly - 1 sets - 3 reps - 30 sec hold - Supine Lower Trunk Rotation  - 2 x daily - 6 x weekly - 1 sets - 10 reps - 5 sec hold - Supine Bridge  - 2 x daily - 6 x weekly - 1-2 sets - 10 reps - 5 hold - Seated Knee Flexion AAROM  - 2 x daily - 7 x weekly - 10 reps - 5 seconds hold - Deadlift with Resistance  - 2 x daily - 6 x weekly - 1-2 sets - 10 reps- Standing Lumbar Extension at Wall - Forearms  - 2 x daily - 6 x weekly - 1 sets - 10 reps - 5 sec hold - Standing Tandem Balance with Counter Support  - 2 x daily - 6 x weekly - 1 sets - 3 reps - 30 hold - Standing Single Leg Stance with Unilateral Counter Support  - 2 x daily - 6 x weekly - 1 sets - 5 reps - 10 hold    ASSESSMENT:   CLINICAL IMPRESSION: Pt tolerated session well today despite continued reports of Lt knee pain.  Will continue to benefit from PT to maximize function.  OBJECTIVE IMPAIRMENTS Abnormal gait, decreased activity tolerance, decreased balance, decreased mobility, difficulty walking, decreased ROM, decreased strength, impaired flexibility, and pain.    ACTIVITY LIMITATIONS lifting, bending, sitting, standing, sleeping, stairs, transfers, and dressing   PARTICIPATION LIMITATIONS: cleaning, driving, and shopping   PERSONAL FACTORS   recent lumbar surgery, HTN, MI, A-fib, CAD, L lumbar laminectomy, atherosclerosis, angina, HTN,  are also affecting patient's functional outcome.    REHAB POTENTIAL: Good   CLINICAL DECISION MAKING: Stable/uncomplicated  EVALUATION COMPLEXITY: Low     GOALS: Goals reviewed with patient? Yes   SHORT TERM GOALS: Target date: 01/18/2023    Patient will demonstrate independent use of initial home exercise program to maintain progress  from in clinic treatments. Goal status: On-going 12/27/22         Long term PT goals (target dates for all long term goals are 6 weeks  02/01/2023  Patient will demonstrate/report pain at worst less than or equal to 2/10 with ADL's.  Goal status: NEW   2. Patient will maintain FOTO outcome > or = 43 % to indicate reduced disability due to condition. Goal status: NEW  3. Pt will improve lumbar ROM to [facility] for improved mobility  Goal status: NEW   4. Pt will improve leg strength grossly to 5/5 bilat for functional strength  Goal status: NEW   5. . Pt will improve left knee extension to 2 degrees or less to improve gait  Goal status: NEW  6. Pt will improve SLS time to 15 seconds on avg to show improved balance  Goal status: NEW   PLAN: PT FREQUENCY: 1-2 times per week    PT DURATION: 6-8 weeks   PLANNED INTERVENTIONS (unless contraindicated): aquatic PT, Canalith repositioning, cryotherapy, Electrical stimulation, Iontophoresis with 4 mg/ml dexamethasome, Moist heat, traction, Ultrasound, gait training, Therapeutic exercise, balance training, neuromuscular re-education, patient/family education, prosthetic training, manual techniques, passive ROM, dry needling, taping, vasopnuematic device, vestibular, spinal manipulations, joint manipulations   PLAN FOR NEXT SESSION: Progress lumbar mobility, dynamic balance, STM, general strength.       Laureen Abrahams, PT, DPT 01/05/23 3:53 PM

## 2023-01-10 ENCOUNTER — Encounter: Payer: Self-pay | Admitting: Physical Therapy

## 2023-01-10 ENCOUNTER — Ambulatory Visit: Payer: Medicare Other | Admitting: Physical Therapy

## 2023-01-10 DIAGNOSIS — M25562 Pain in left knee: Secondary | ICD-10-CM

## 2023-01-10 DIAGNOSIS — M6281 Muscle weakness (generalized): Secondary | ICD-10-CM | POA: Diagnosis not present

## 2023-01-10 DIAGNOSIS — M5459 Other low back pain: Secondary | ICD-10-CM

## 2023-01-10 DIAGNOSIS — R2689 Other abnormalities of gait and mobility: Secondary | ICD-10-CM

## 2023-01-10 NOTE — Therapy (Signed)
[facility] [address] [place], [place], [address] Phone: [phone]   Fax:  [phone]  Patient Details  Name: Erica Frank MRN: [id number] Date of Birth: December 09, 1940 Referring Provider:  Mcarthur Rossetti*  Encounter Date: 01/10/2023    OUTPATIENT PHYSICAL THERAPY TREATMENT NOTE:   Knee and Back    Patient Name: Erica Frank MRN: [id number] DOB:May 15, 1941, 82 y.o., female Today's Date: 01/10/2023   END OF SESSION:   PT End of Session - 01/10/23 1114     Visit Number 5    Number of Visits 12    Date for PT Re-Evaluation 02/01/23    Authorization Type UHC MCR    Progress Note Due on Visit 10    PT Start Time 1110    PT Stop Time 1148    PT Time Calculation (min) 38 min    Activity Tolerance Patient tolerated treatment well    Behavior During Therapy [facility] for tasks assessed/performed                 Past Medical History:  Diagnosis Date   Anginal pain ([place]) 1989   Anxiety    Arthritis    Atherosclerosis    Atrial fibrillation ([place])    Coronary artery disease    Dysrhythmia    a-fib   Elevated liver enzymes    GERD (gastroesophageal reflux disease)    Heart attack ([place]) 2020   Heart disease    High cholesterol    Hypertension    NSTEMI (non-ST elevated myocardial infarction) ([place]) 07/09/2019   Past Surgical History:  Procedure Laterality Date   ABDOMINAL HYSTERECTOMY  1983   for endometriosis with Burch   ANGIOPLASTY     ANGIOPLASTY     BACK SURGERY     1990   CARDIAC SURGERY     Catheterization   CORONARY STENT INTERVENTION N/A 07/09/2019   Procedure: CORONARY STENT INTERVENTION;  Surgeon: Adrian Prows, MD;  Location: [facility];  Service: Cardiovascular;  Laterality: N/A;   LEFT HEART CATH AND CORONARY ANGIOGRAPHY N/A 07/09/2019   Procedure: LEFT HEART CATH AND CORONARY ANGIOGRAPHY and possible intervention;  Surgeon: Adrian Prows, MD;  Location: [facility];   Service: Cardiovascular;  Laterality: N/A;  4: or 4:30 PM today   LUMBAR LAMINECTOMY/DECOMPRESSION MICRODISCECTOMY Left 02/02/2022   Procedure: Left Lumbar two-three, Lumbar five-Sacral one Sublaminar decompression;  Surgeon: Kristeen Miss, MD;  Location: [place];  Service: Neurosurgery;  Laterality: Left;   NECK SURGERY     TOTAL KNEE ARTHROPLASTY Left 10/07/2020   Procedure: TOTAL KNEE ARTHROPLASTY;  Surgeon: Paralee Cancel, MD;  [facility]: [facility];  Service: Orthopedics;  Laterality: Left;  70 mins   TOTAL KNEE REVISION Left 05/13/2022   Procedure: SYNOVECTOMY AND LEFT KNEE POLY-LINER EXCHANGE;  Surgeon: Mcarthur Rossetti, MD;  Location: [place];  Service: Orthopedics;  Laterality: Left;   Patient Active Problem List   Diagnosis Date Noted   Status post revision of total replacement of left knee 05/13/2022   Degenerative lumbar spinal stenosis 02/02/2022   S/P left TKA 10/07/2020   Left knee OA 10/07/2020   Syncope 01/16/2020   Anemia 01/16/2020   Thrombocytopenia ([place]) 01/16/2020   Paroxysmal atrial fibrillation ([place]) 02/15/2019   Mixed hyperlipidemia 02/15/2019   Bradycardia 02/15/2019   Coronary artery disease involving native coronary artery of native heart with unstable angina pectoris ([place]) 12/20/2018   Dyslipidemia 12/20/2018   Essential hypertension 12/20/2018   PCP: Prince Solian,  MD  REFERRING PROVIDER: Mcarthur Rossetti, MD  REFERRING DIAG: [phone] (ICD-10-CM) - Status post revision of total knee, left  ONSET DATE: 05/13/2022 TKA revision  THERAPY DIAG:  Other low back pain  Acute pain of left knee  Muscle weakness (generalized)  Other abnormalities of gait and mobility  Rationale for Evaluation and Treatment Rehabilitation  PERTINENT HISTORY: PMHx:  recent back surgery for ruptured disc 3 months ago per patient report, Lt TKA revision 05/13/22 HTN, MI, A-fib, CAD, L lumbar laminectomy, atherosclerosis, angina, HTN,  PRECAUTIONS: None  SUBJECTIVE: She  reports her back pain has improved and her knee isn't hurting as bad today. She reports that her knee is still very stiff though.  PAIN:  NPRS scale: 3-4/10 in left knee, 1/10 in low back Pain location: left knee more in front/medial Pain description: tired, tightness Aggravating factors: housework, vacuuming, knee is worse and stiff in the morning Relieving factors: meds  FALLS:  Has patient fallen in last 6 months? NO  OBJECTIVE: (objective measures completed at initial evaluation unless otherwise dated)  PATIENT SURVEYS:  Eval: FOTO 43% (predicted 43%)                  SENSATION: Eval: WFL   EDEMA: Mild edema Lt knee   LOWER EXTREMITY/LUMBAR ROM:  AROM Eval Left 12/21/22 Right eval 12/21/22 Left 12/27/22      Lumbar flexion WNL          Lumbar extension extension  25%         Lumbar rotation Right  75%         Lumbar rotation left  75%         Knee flexion 110  110      Knee extension 4 2 4        (Blank rows = not tested)     LOWER EXTREMITY MMT:  MMT Right Eval 12/21/22 Left eval 12/21/22  Hip flexion 4+ 4+  Hip extension    Hip abduction 4+ 4+  Hip adduction    Hip internal rotation    Hip external rotation    Knee flexion 5 4+  Knee extension 5 4  Ankle dorsiflexion 4 4  Ankle plantarflexion    Ankle inversion    Ankle eversion     (Blank rows = not tested)       FUNCTIONAL TESTS:  Eval:  5 times sit to stand: 9 seconds without UE support Tandem balance: 10-30 seconds SLS 10 seconds bilat    GAIT: Eval: Mild limp, decreased TKE, independent community ambulator  SPECIAL TESTS:  Slump test: negative  SLR test: negative       TODAY'S TREATMENT: 01/10/23 TherEx:  Nu step seat 5 L6 x 6 min; LEs only Supine SKTC x5 each side hold for 15 sec DKTC 3 x 30 sec hold Supine lower trunk rotation x3 each side hold for 30 sec Seated lumbar flex into plyoball hold for 5 sec x10 Seated march into plyoball hip flex and shoulder ext hold for 5 sec x5  each leg  Bridges x10 reps; 5 sec hold  Neuro Re-Ed SLS 2x30 sec bil intermittent UE support  Manual Therapy STM to lumbar spine and paraspinals x8 min    01/05/23 TherEx:  Nu step seat 5 L5 x 6 min; LEs only Supine SKTC 10 x 10 sec hold DKTC 10 x 10 sec hold Bridges 2x10 reps; 5 sec hold Sit to/from stand without UE support x 10 reps  Neuro Re-Ed  SLS 2x30 sec bil intermittent UE support Tandem stance 2x30 sec bil intermittent UE support  Self Care Discussed options for pain including pain psychologist and pain management, pt interested in pain psychologist so provided information on local options Discussed desensitization strategies for Lt knee  12/29/22:  TherEx:  Nu step seat 5 level 5 x20min  Deadlift with blue resistance band under feet 2x10 (cues to keep arms straight) Sit to stand: x 10 without UE support Wall extension on elbows x 10 holding 5 sec Sitting lumbar flexion rolling green physio-ball forward x 5 holding 10 seconds Sitting QL stretch rolling green physio-ball to each side holding 5 sec x 4  Supine: SAQ over bolster 2# x 10 holding 5 sec  Supine: SLR 2# 2x10 slow eccentrics Seated LAQ 2# 2x10  Leg press DL #86 V78, second set #469 x15, single leg #62 2x15 on left, #68 2x15 on right  Tandem walking x3 round trips in bars Standing marches x3 round trips in bars SLS hold for 10 sec x3 each side in bars  Modalities:  Moist heat to low back following session x 5 minutes (not included in treatment time)      PATIENT EDUCATION:  Education details: PT POC, HEP Person educated: Patient Education method: Explanation, Demonstration, Tactile cues, Verbal cues, and Handouts Education comprehension: verbalized understanding, returned demonstration, and needs further education     HOME EXERCISE PROGRAM:  Access Code: [email or web address] URL: [email or web address] Date: 12/21/2022 Prepared by: Ivery Quale Exercises - Seated Passive Knee Extension  -  3 x daily - 6 x weekly - 1 sets - 20 reps - 5 sec hold - Hooklying Single Knee to Chest Stretch  - 2 x daily - 6 x weekly - 1 sets - 3 reps - 30 sec hold - Supine Lower Trunk Rotation  - 2 x daily - 6 x weekly - 1 sets - 10 reps - 5 sec hold - Supine Bridge  - 2 x daily - 6 x weekly - 1-2 sets - 10 reps - 5 hold - Seated Knee Flexion AAROM  - 2 x daily - 7 x weekly - 10 reps - 5 seconds hold - Deadlift with Resistance  - 2 x daily - 6 x weekly - 1-2 sets - 10 reps- Standing Lumbar Extension at Wall - Forearms  - 2 x daily - 6 x weekly - 1 sets - 10 reps - 5 sec hold - Standing Tandem Balance with Counter Support  - 2 x daily - 6 x weekly - 1 sets - 3 reps - 30 hold - Standing Single Leg Stance with Unilateral Counter Support  - 2 x daily - 6 x weekly - 1 sets - 5 reps - 10 hold    ASSESSMENT:   CLINICAL IMPRESSION: She tolerated session well today with decreases in pain. She wanted to focus on her back and balance which she showed good improvements especially with her balance. She is still shoing limitations in general strength and mobility and will benefit from skilled PT to address these deficits.   OBJECTIVE IMPAIRMENTS Abnormal gait, decreased activity tolerance, decreased balance, decreased mobility, difficulty walking, decreased ROM, decreased strength, impaired flexibility, and pain.    ACTIVITY LIMITATIONS lifting, bending, sitting, standing, sleeping, stairs, transfers, and dressing   PARTICIPATION LIMITATIONS: cleaning, driving, and shopping   PERSONAL FACTORS   recent lumbar surgery, HTN, MI, A-fib, CAD, L lumbar laminectomy, atherosclerosis, angina, HTN,  are also affecting patient's functional outcome.    REHAB POTENTIAL:  Good   CLINICAL DECISION MAKING: Stable/uncomplicated   EVALUATION COMPLEXITY: Low     GOALS: Goals reviewed with patient? Yes   SHORT TERM GOALS: Target date: 01/18/2023    Patient will demonstrate independent use of initial home exercise program to  maintain progress from in clinic treatments. Goal status: On-going 12/27/22         Long term PT goals (target dates for all long term goals are 6 weeks  02/01/2023  Patient will demonstrate/report pain at worst less than or equal to 2/10 with ADL's.  Goal status: NEW   2. Patient will maintain FOTO outcome > or = 43 % to indicate reduced disability due to condition. Goal status: NEW  3. Pt will improve lumbar ROM to [facility] for improved mobility  Goal status: NEW   4. Pt will improve leg strength grossly to 5/5 bilat for functional strength  Goal status: NEW   5. . Pt will improve left knee extension to 2 degrees or less to improve gait  Goal status: NEW  6. Pt will improve SLS time to 15 seconds on avg to show improved balance  Goal status: NEW   PLAN: PT FREQUENCY: 1-2 times per week    PT DURATION: 6-8 weeks   PLANNED INTERVENTIONS (unless contraindicated): aquatic PT, Canalith repositioning, cryotherapy, Electrical stimulation, Iontophoresis with 4 mg/ml dexamethasome, Moist heat, traction, Ultrasound, gait training, Therapeutic exercise, balance training, neuromuscular re-education, patient/family education, prosthetic training, manual techniques, passive ROM, dry needling, taping, vasopnuematic device, vestibular, spinal manipulations, joint manipulations   PLAN FOR NEXT SESSION: Progress lumbar mobility, dynamic balance, STM, general strength.      Cyruss Arata, SPT

## 2023-01-12 ENCOUNTER — Ambulatory Visit: Payer: Medicare Other | Admitting: Physical Therapy

## 2023-01-12 ENCOUNTER — Encounter: Payer: Self-pay | Admitting: Physical Therapy

## 2023-01-12 DIAGNOSIS — M6281 Muscle weakness (generalized): Secondary | ICD-10-CM

## 2023-01-12 DIAGNOSIS — M25562 Pain in left knee: Secondary | ICD-10-CM | POA: Diagnosis not present

## 2023-01-12 DIAGNOSIS — M5459 Other low back pain: Secondary | ICD-10-CM

## 2023-01-12 DIAGNOSIS — R2689 Other abnormalities of gait and mobility: Secondary | ICD-10-CM | POA: Diagnosis not present

## 2023-01-12 NOTE — Therapy (Signed)
[facility] [address] [place], [place], [address] Phone: [phone]   Fax:  [phone]  Patient Details  Name: Erica Frank MRN: [id number] Date of Birth: January 28, 1941 Referring Provider:  Mcarthur Rossetti*  Encounter Date: 01/12/2023    OUTPATIENT PHYSICAL THERAPY TREATMENT NOTE:   Knee and Back    Patient Name: Erica Frank MRN: [id number] DOB:1941-10-27, 82 y.o., female Today's Date: 01/12/2023   END OF SESSION:   PT End of Session - 01/12/23 1023     Visit Number 6    Number of Visits 12    Date for PT Re-Evaluation 02/01/23    Authorization Type UHC MCR    Progress Note Due on Visit 10    PT Start Time 1018    PT Stop Time 1103    PT Time Calculation (min) 45 min    Activity Tolerance Patient tolerated treatment well    Behavior During Therapy [facility] for tasks assessed/performed                  Past Medical History:  Diagnosis Date   Anginal pain ([place]) 1989   Anxiety    Arthritis    Atherosclerosis    Atrial fibrillation ([place])    Coronary artery disease    Dysrhythmia    a-fib   Elevated liver enzymes    GERD (gastroesophageal reflux disease)    Heart attack ([place]) 2020   Heart disease    High cholesterol    Hypertension    NSTEMI (non-ST elevated myocardial infarction) ([place]) 07/09/2019   Past Surgical History:  Procedure Laterality Date   ABDOMINAL HYSTERECTOMY  1983   for endometriosis with Burch   [place]     Catheterization   CORONARY STENT INTERVENTION N/A 07/09/2019   Procedure: CORONARY STENT INTERVENTION;  Surgeon: Adrian Prows, MD;  Location: [facility];  Service: Cardiovascular;  Laterality: N/A;   LEFT HEART CATH AND CORONARY ANGIOGRAPHY N/A 07/09/2019   Procedure: LEFT HEART CATH AND CORONARY ANGIOGRAPHY and possible intervention;  Surgeon: Adrian Prows, MD;  Location: [facility];   Service: Cardiovascular;  Laterality: N/A;  4: or 4:30 PM today   LUMBAR LAMINECTOMY/DECOMPRESSION MICRODISCECTOMY Left 02/02/2022   Procedure: Left Lumbar two-three, Lumbar five-Sacral one Sublaminar decompression;  Surgeon: Kristeen Miss, MD;  Location: [place];  Service: Neurosurgery;  Laterality: Left;   NECK SURGERY     TOTAL KNEE ARTHROPLASTY Left 10/07/2020   Procedure: TOTAL KNEE ARTHROPLASTY;  Surgeon: Paralee Cancel, MD;  [facility];  Service: Orthopedics;  Laterality: Left;  70 mins   TOTAL KNEE REVISION Left 05/13/2022   Procedure: SYNOVECTOMY AND LEFT KNEE POLY-LINER EXCHANGE;  Surgeon: Mcarthur Rossetti, MD;  Location: [place];  Service: Orthopedics;  Laterality: Left;   Patient Active Problem List   Diagnosis Date Noted   Status post revision of total replacement of left knee 05/13/2022   Degenerative lumbar spinal stenosis 02/02/2022   S/P left TKA 10/07/2020   Left knee OA 10/07/2020   Syncope 01/16/2020   Anemia 01/16/2020   Thrombocytopenia ([place]) 01/16/2020   Paroxysmal atrial fibrillation ([place]) 02/15/2019   Mixed hyperlipidemia 02/15/2019   Bradycardia 02/15/2019   Coronary artery disease involving native coronary artery of native heart with unstable angina pectoris ([place]) 12/20/2018   Dyslipidemia 12/20/2018   Essential hypertension 12/20/2018   PCP: Dagmar Hait,  Steva Ready, MD  REFERRING PROVIDER: Mcarthur Rossetti, MD  REFERRING DIAG: [phone] (ICD-10-CM) - Status post revision of total knee, left  ONSET DATE: 05/13/2022 TKA revision  THERAPY DIAG:  Other low back pain  Acute pain of left knee  Muscle weakness (generalized)  Other abnormalities of gait and mobility  Rationale for Evaluation and Treatment Rehabilitation  PERTINENT HISTORY: PMHx:  recent back surgery for ruptured disc 3 months ago per patient report, Lt TKA revision 05/13/22 HTN, MI, A-fib, CAD, L lumbar laminectomy, atherosclerosis, angina, HTN,  PRECAUTIONS: None  SUBJECTIVE: She  reports she is feeling better especially in her back, but the knee is still bothering her and says it is just very sensitive.   PAIN:  NPRS scale: 4-5/10 in left knee, 1/10 in low back Pain location: left knee more in front/medial Pain description: tired, tightness Aggravating factors: housework, vacuuming, knee is worse and stiff in the morning Relieving factors: meds  FALLS:  Has patient fallen in last 6 months? NO  OBJECTIVE: (objective measures completed at initial evaluation unless otherwise dated)  PATIENT SURVEYS:  Eval: FOTO 43% (predicted 43%)                  SENSATION: Eval: WFL   EDEMA: Mild edema Lt knee   LOWER EXTREMITY/LUMBAR ROM:  AROM Eval Left 12/21/22 Right eval 12/21/22 Left 12/27/22      Lumbar flexion WNL          Lumbar extension extension  25%         Lumbar rotation Right  75%         Lumbar rotation left  75%         Knee flexion 110  110      Knee extension 4 2 4        (Blank rows = not tested)     LOWER EXTREMITY MMT:  MMT Right Eval 12/21/22 Left eval 12/21/22  Hip flexion 4+ 4+  Hip extension    Hip abduction 4+ 4+  Hip adduction    Hip internal rotation    Hip external rotation    Knee flexion 5 4+  Knee extension 5 4  Ankle dorsiflexion 4 4  Ankle plantarflexion    Ankle inversion    Ankle eversion     (Blank rows = not tested)       FUNCTIONAL TESTS:  Eval:  5 times sit to stand: 9 seconds without UE support Tandem balance: 10-30 seconds SLS 10 seconds bilat    GAIT: Eval: Mild limp, decreased TKE, independent community ambulator  SPECIAL TESTS:  Slump test: negative  SLR test: negative     TODAY'S TREATMENT: 01/12/23 TherEx:  Nu step seat 5 L6 x 7 min; LEs only Desensitization to bilat knee x1 min with towel, x1 min with pillow case, x62min with button down shirt, x110min bandage, x1 min moist heat pack, x27min cold pack, x59min moist heat pack, x65min cold pack  DKTC 3 x 30 sec hold Supine lower trunk rotation x3  each side hold for 30 sec Seated march into plyoball hip flex and shoulder ext hold for 5 sec x5 each leg  Bridges x10 reps; 5 sec hold SLR 2x10 bilat    01/10/23 TherEx:  Nu step seat 5 L6 x 6 min; LEs only Supine SKTC x5 each side hold for 15 sec DKTC 3 x 30 sec hold Supine lower trunk rotation x3 each side hold for 30 sec Seated lumbar flex into plyoball hold for  5 sec x10 Seated march into plyoball hip flex and shoulder ext hold for 5 sec x5 each leg  Bridges x10 reps; 5 sec hold  Neuro Re-Ed SLS 2x30 sec bil intermittent UE support  Manual Therapy STM to lumbar spine and paraspinals x8 min  01/05/23 TherEx:  Nu step seat 5 L5 x 6 min; LEs only Supine SKTC 10 x 10 sec hold DKTC 10 x 10 sec hold Bridges 2x10 reps; 5 sec hold Sit to/from stand without UE support x 10 reps  Neuro Re-Ed SLS 2x30 sec bil intermittent UE support Tandem stance 2x30 sec bil intermittent UE support  Self Care Discussed options for pain including pain psychologist and pain management, pt interested in pain psychologist so provided information on local options Discussed desensitization strategies for Lt knee   PATIENT EDUCATION:  Education details: PT POC, HEP Person educated: Patient Education method: Explanation, Demonstration, Tactile cues, Verbal cues, and Handouts Education comprehension: verbalized understanding, returned demonstration, and needs further education     HOME EXERCISE PROGRAM:  Access Code: [email or web address] URL: [email or web address] Date: 12/21/2022 Prepared by: Elsie Ra Exercises - Seated Passive Knee Extension  - 3 x daily - 6 x weekly - 1 sets - 20 reps - 5 sec hold - Hooklying Single Knee to Chest Stretch  - 2 x daily - 6 x weekly - 1 sets - 3 reps - 30 sec hold - Supine Lower Trunk Rotation  - 2 x daily - 6 x weekly - 1 sets - 10 reps - 5 sec hold - Supine Bridge  - 2 x daily - 6 x weekly - 1-2 sets - 10 reps - 5 hold - Seated Knee Flexion AAROM  - 2  x daily - 7 x weekly - 10 reps - 5 seconds hold - Deadlift with Resistance  - 2 x daily - 6 x weekly - 1-2 sets - 10 reps- Standing Lumbar Extension at Wall - Forearms  - 2 x daily - 6 x weekly - 1 sets - 10 reps - 5 sec hold - Standing Tandem Balance with Counter Support  - 2 x daily - 6 x weekly - 1 sets - 3 reps - 30 hold - Standing Single Leg Stance with Unilateral Counter Support  - 2 x daily - 6 x weekly - 1 sets - 5 reps - 10 hold    ASSESSMENT:   CLINICAL IMPRESSION: She tolerated session well today with decreases in pain in her back especially. We trialed Desensitization technique to bilateral knees as she says her left knee feels very sensitive and she was advised to do this technique at home.   OBJECTIVE IMPAIRMENTS Abnormal gait, decreased activity tolerance, decreased balance, decreased mobility, difficulty walking, decreased ROM, decreased strength, impaired flexibility, and pain.    ACTIVITY LIMITATIONS lifting, bending, sitting, standing, sleeping, stairs, transfers, and dressing   PARTICIPATION LIMITATIONS: cleaning, driving, and shopping   PERSONAL FACTORS   recent lumbar surgery, HTN, MI, A-fib, CAD, L lumbar laminectomy, atherosclerosis, angina, HTN,  are also affecting patient's functional outcome.    REHAB POTENTIAL: Good   CLINICAL DECISION MAKING: Stable/uncomplicated   EVALUATION COMPLEXITY: Low     GOALS: Goals reviewed with patient? Yes   SHORT TERM GOALS: Target date: 01/18/2023    Patient will demonstrate independent use of initial home exercise program to maintain progress from in clinic treatments. Goal status: On-going 12/27/22         Long term PT goals (target dates for all long term goals  are 6 weeks  02/01/2023  Patient will demonstrate/report pain at worst less than or equal to 2/10 with ADL's.  Goal status: NEW   2. Patient will maintain FOTO outcome > or = 43 % to indicate reduced disability due to condition. Goal status: NEW  3. Pt  will improve lumbar ROM to [facility] for improved mobility  Goal status: NEW   4. Pt will improve leg strength grossly to 5/5 bilat for functional strength  Goal status: NEW   5. . Pt will improve left knee extension to 2 degrees or less to improve gait  Goal status: NEW  6. Pt will improve SLS time to 15 seconds on avg to show improved balance  Goal status: NEW   PLAN: PT FREQUENCY: 1-2 times per week    PT DURATION: 6-8 weeks   PLANNED INTERVENTIONS (unless contraindicated): aquatic PT, Canalith repositioning, cryotherapy, Electrical stimulation, Iontophoresis with 4 mg/ml dexamethasome, Moist heat, traction, Ultrasound, gait training, Therapeutic exercise, balance training, neuromuscular re-education, patient/family education, prosthetic training, manual techniques, passive ROM, dry needling, taping, vasopnuematic device, vestibular, spinal manipulations, joint manipulations   PLAN FOR NEXT SESSION: Progress lumbar mobility, balance, general strength, update measurements   Sherlie Boyum, SPT

## 2023-01-17 ENCOUNTER — Encounter: Payer: Self-pay | Admitting: Physical Therapy

## 2023-01-17 ENCOUNTER — Ambulatory Visit: Payer: Medicare Other | Admitting: Physical Therapy

## 2023-01-17 ENCOUNTER — Encounter: Payer: Medicare Other | Admitting: Physical Therapy

## 2023-01-17 DIAGNOSIS — R2689 Other abnormalities of gait and mobility: Secondary | ICD-10-CM | POA: Diagnosis not present

## 2023-01-17 DIAGNOSIS — M5459 Other low back pain: Secondary | ICD-10-CM

## 2023-01-17 DIAGNOSIS — M6281 Muscle weakness (generalized): Secondary | ICD-10-CM

## 2023-01-17 DIAGNOSIS — M25562 Pain in left knee: Secondary | ICD-10-CM

## 2023-01-17 NOTE — Therapy (Signed)
[facility] [address] [place], [place], [address] Phone: [phone]   Fax:  [phone]  Patient Details  Name: Erica Frank MRN: [id number] Date of Birth: 1941-08-20 Referring Provider:  Prince Solian, MD  Encounter Date: 01/17/2023    OUTPATIENT PHYSICAL THERAPY TREATMENT NOTE:   Knee and Back    Patient Name: Erica Frank MRN: [id number] DOB:02-Apr-1941, 82 y.o., female Today's Date: 01/17/2023   END OF SESSION:   PT End of Session - 01/17/23 1453     Visit Number 7    Number of Visits 12    Date for PT Re-Evaluation 02/01/23    Authorization Type [employer] MCR    Progress Note Due on Visit 10    PT Start Time 1450    PT Stop Time 1532    PT Time Calculation (min) 42 min    Activity Tolerance Patient tolerated treatment well    Behavior During Therapy [facility] for tasks assessed/performed                  Past Medical History:  Diagnosis Date   Anginal pain ([place]) 1989   Anxiety    Arthritis    Atherosclerosis    Atrial fibrillation ([place])    Coronary artery disease    Dysrhythmia    a-fib   Elevated liver enzymes    GERD (gastroesophageal reflux disease)    Heart attack ([place]) 2020   Heart disease    High cholesterol    Hypertension    NSTEMI (non-ST elevated myocardial infarction) ([place]) 07/09/2019   Past Surgical History:  Procedure Laterality Date   ABDOMINAL HYSTERECTOMY  1983   for endometriosis with Burch   ANGIOPLASTY     ANGIOPLASTY     BACK SURGERY     1990   CARDIAC SURGERY     Catheterization   CORONARY STENT INTERVENTION N/A 07/09/2019   Procedure: CORONARY STENT INTERVENTION;  Surgeon: Adrian Prows, MD;  Location: [place] [facility];  Service: Cardiovascular;  Laterality: N/A;   LEFT HEART CATH AND CORONARY ANGIOGRAPHY N/A 07/09/2019   Procedure: LEFT HEART CATH AND CORONARY ANGIOGRAPHY and possible intervention;  Surgeon: Adrian Prows, MD;  Location: [place] [facility];   Service: Cardiovascular;  Laterality: N/A;  4: or 4:30 PM today   LUMBAR LAMINECTOMY/DECOMPRESSION MICRODISCECTOMY Left 02/02/2022   Procedure: Left Lumbar two-three, Lumbar five-Sacral one Sublaminar decompression;  Surgeon: Kristeen Miss, MD;  Location: [facility];  Service: Neurosurgery;  Laterality: Left;   NECK SURGERY     TOTAL KNEE ARTHROPLASTY Left 10/07/2020   Procedure: TOTAL KNEE ARTHROPLASTY;  Surgeon: Paralee Cancel, MD;  Location: [facility];  Service: Orthopedics;  Laterality: Left;  70 mins   TOTAL KNEE REVISION Left 05/13/2022   Procedure: SYNOVECTOMY AND LEFT KNEE POLY-LINER EXCHANGE;  Surgeon: Mcarthur Rossetti, MD;  Location: [facility];  Service: Orthopedics;  Laterality: Left;   Patient Active Problem List   Diagnosis Date Noted   Status post revision of total replacement of left knee 05/13/2022   Degenerative lumbar spinal stenosis 02/02/2022   S/P left TKA 10/07/2020   Left knee OA 10/07/2020   Syncope 01/16/2020   Anemia 01/16/2020   Thrombocytopenia ([place]) 01/16/2020   Paroxysmal atrial fibrillation ([place]) 02/15/2019   Mixed hyperlipidemia 02/15/2019   Bradycardia 02/15/2019   Coronary artery disease involving native coronary artery of native heart with unstable angina pectoris ([place]) 12/20/2018   Dyslipidemia 12/20/2018   Essential hypertension 12/20/2018   PCP: Dagmar Hait,  Ravisankar, MD  REFERRING PROVIDER: Mcarthur Rossetti, MD  REFERRING DIAG: [phone] (ICD-10-CM) - Status post revision of total knee, left  ONSET DATE: 05/13/2022 TKA revision  THERAPY DIAG:  Other low back pain  Acute pain of left knee  Muscle weakness (generalized)  Other abnormalities of gait and mobility  Rationale for Evaluation and Treatment Rehabilitation  PERTINENT HISTORY: PMHx:  recent back surgery for ruptured disc 3 months ago per patient report, Lt TKA revision 05/13/22 HTN, MI, A-fib, CAD, L lumbar laminectomy, atherosclerosis, angina, HTN,  PRECAUTIONS: None  SUBJECTIVE: She  reports she is feeling the best she has ever been. She reports her knee does not really hurt and she said her back is just feeling a little tired.   PAIN:  NPRS scale: 1/10 left knee, 0/10 back  Pain location: left knee more in front/medial Pain description: tired, tightness Aggravating factors: housework, vacuuming, knee is worse and stiff in the morning Relieving factors: meds  FALLS:  Has patient fallen in last 6 months? NO  OBJECTIVE: (objective measures completed at initial evaluation unless otherwise dated)  PATIENT SURVEYS:  Eval: FOTO 43% (predicted 43%)                  SENSATION: Eval: WFL   EDEMA: Mild edema Lt knee   LOWER EXTREMITY/LUMBAR ROM:  AROM Eval Left 12/21/22 Right eval 12/21/22 Left 12/27/22      Lumbar flexion WNL          Lumbar extension extension  25%         Lumbar rotation Right  75%         Lumbar rotation left  75%         Knee flexion 110  110      Knee extension 4 2 4       $ (Blank rows = not tested)     LOWER EXTREMITY MMT:  MMT Right Eval 12/21/22 Left eval 12/21/22  Hip flexion 4+ 4+  Hip extension    Hip abduction 4+ 4+  Hip adduction    Hip internal rotation    Hip external rotation    Knee flexion 5 4+  Knee extension 5 4  Ankle dorsiflexion 4 4  Ankle plantarflexion    Ankle inversion    Ankle eversion     (Blank rows = not tested)       FUNCTIONAL TESTS:  Eval:  5 times sit to stand: 9 seconds without UE support Tandem balance: 10-30 seconds SLS 10 seconds bilat    GAIT: Eval: Mild limp, decreased TKE, independent community ambulator  SPECIAL TESTS:  Slump test: negative  SLR test: negative     TODAY'S TREATMENT: 01/17/23 TherEx:  Nu step seat 5 L6-L4 x 8 min; LEs only DKTC 2 x 30 sec hold Supine lower trunk rotation x3 each side hold for 30 sec Seated lumbar flex into physioball hold for 5 sec x10 Seated march into physioball hip flex and shoulder ext hold for 5 sec x10 each leg  Bridges x10 reps; 5  sec hold Seated SLR 2x10 bilat 5 sec hold at  Sit to stands 2x10  SLS 20 sec x3 bilat  Tandem walking in bars with touchdown support x3 round trips  Rocker board in bars A-P x1 min, lateral x1 min Lateral step ups 6in step x15 bilat Stepback touches 4in step X10 bilat  01/12/23 TherEx:  Nu step seat 5 L6 x 7 min; LEs only Desensitization to bilat knee x1 min  with towel, x1 min with pillow case, x76mn with button down shirt, x154m bandage, x1 min moist heat pack, x1m66mcold pack, x1mi41moist heat pack, x1min53mld pack  DKTC 3 x 30 sec hold Supine lower trunk rotation x3 each side hold for 30 sec Seated march into plyoball hip flex and shoulder ext hold for 5 sec x5 each leg  Bridges x10 reps; 5 sec hold SLR 2x10 bilat   PATIENT EDUCATION:  Education details: PT POC, HEP Person educated: Patient Education method: ExplaConsulting civil engineeronstration, TactiCorporate treasurer, Verbal cues, and Handouts Education comprehension: verbalized understanding, returned demonstration, and needs further education     HOME EXERCISE PROGRAM:  Access Code: [email or web address] Date: 12/21/2022 Prepared by: BrianElsie Racises - Seated Passive Knee Extension  - 3 x daily - 6 x weekly - 1 sets - 20 reps - 5 sec hold - Hooklying Single Knee to Chest Stretch  - 2 x daily - 6 x weekly - 1 sets - 3 reps - 30 sec hold - Supine Lower Trunk Rotation  - 2 x daily - 6 x weekly - 1 sets - 10 reps - 5 sec hold - Supine Bridge  - 2 x daily - 6 x weekly - 1-2 sets - 10 reps - 5 hold - Seated Knee Flexion AAROM  - 2 x daily - 7 x weekly - 10 reps - 5 seconds hold - Deadlift with Resistance  - 2 x daily - 6 x weekly - 1-2 sets - 10 reps- Standing Lumbar Extension at [place] x daily - 6 x weekly - 1 sets - 10 reps - 5 sec hold - Standing Tandem Balance with Counter Support  - 2 x daily - 6 x weekly - 1 sets - 3 reps - 30 hold - Standing Single Leg Stance with Unilateral Counter Support  -  2 x daily - 6 x weekly - 1 sets - 5 reps - 10 hold    ASSESSMENT:   CLINICAL IMPRESSION: NancyJamairarated the treatment session very well today. She had little to no pain so we were able to add in more strength and balance. She still shows some limitations in dynamic balance, lumbar ROM, and LE strength and will benefit from skilled PT to improve in these areas. This is her last visit she had scheduled and was advised to schedule out through the end of February.   OBJECTIVE IMPAIRMENTS Abnormal gait, decreased activity tolerance, decreased balance, decreased mobility, difficulty walking, decreased ROM, decreased strength, impaired flexibility, and pain.    ACTIVITY LIMITATIONS lifting, bending, sitting, standing, sleeping, stairs, transfers, and dressing   PARTICIPATION LIMITATIONS: cleaning, driving, and shopping   PERSONAL FACTORS   recent lumbar surgery, HTN, MI, A-fib, CAD, L lumbar laminectomy, atherosclerosis, angina, HTN,  are also affecting patient's functional outcome.    REHAB POTENTIAL: Good   CLINICAL DECISION MAKING: Stable/uncomplicated   EVALUATION COMPLEXITY: Low     GOALS: Goals reviewed with patient? Yes   SHORT TERM GOALS: Target date: 01/18/2023    Patient will demonstrate independent use of initial home exercise program to maintain progress from in clinic treatments. Goal status: MET 01/17/23         Long term PT goals (target dates for all long term goals are 6 weeks  02/01/2023  Patient will demonstrate/report pain at worst less than or equal to 2/10 with ADL's.  Goal status: NEW   2. Patient will maintain FOTO outcome > or =  43 % to indicate reduced disability due to condition. Goal status: NEW  3. Pt will improve lumbar ROM to [facility] for improved mobility  Goal status: NEW   4. Pt will improve leg strength grossly to 5/5 bilat for functional strength  Goal status: NEW   5. . Pt will improve left knee extension to 2 degrees or less to improve  gait  Goal status: NEW  6. Pt will improve SLS time to 15 seconds on avg to show improved balance  Goal status: NEW   PLAN: PT FREQUENCY: 1-2 times per week    PT DURATION: 6-8 weeks   PLANNED INTERVENTIONS (unless contraindicated): aquatic PT, Canalith repositioning, cryotherapy, Electrical stimulation, Iontophoresis with 4 mg/ml dexamethasome, Moist heat, traction, Ultrasound, gait training, Therapeutic exercise, balance training, neuromuscular re-education, patient/family education, prosthetic training, manual techniques, passive ROM, dry needling, taping, vasopnuematic device, vestibular, spinal manipulations, joint manipulations   PLAN FOR NEXT SESSION: dynamic balance, general strength, update measurements   Ahjanae Cassel, SPT

## 2023-01-19 ENCOUNTER — Encounter: Payer: Medicare Other | Admitting: Physical Therapy

## 2023-01-24 ENCOUNTER — Encounter: Payer: Medicare Other | Admitting: Physical Therapy

## 2023-01-24 NOTE — Therapy (Incomplete)
[facility] [address] [place], [place], [address] Phone: [phone]   Fax:  [phone]  Patient Details  Name: Erica Frank MRN: [id number] Date of Birth: 1940/12/30 Referring Provider:  Prince Solian, MD  Encounter Date: 01/24/2023    OUTPATIENT PHYSICAL THERAPY TREATMENT NOTE:   Knee and Back    Patient Name: Erica Frank MRN: [id number] DOB:11-23-1941, 82 y.o., female Today's Date: 01/24/2023   END OF SESSION:          Past Medical History:  Diagnosis Date   Anginal pain ([place]) 1989   Anxiety    Arthritis    Atherosclerosis    Atrial fibrillation ([place])    Coronary artery disease    Dysrhythmia    a-fib   Elevated liver enzymes    GERD (gastroesophageal reflux disease)    Heart attack ([place]) 2020   Heart disease    High cholesterol    Hypertension    NSTEMI (non-ST elevated myocardial infarction) ([place]) 07/09/2019   Past Surgical History:  Procedure Laterality Date   ABDOMINAL HYSTERECTOMY  1983   for endometriosis with Burch   [place]     Catheterization   CORONARY STENT INTERVENTION N/A 07/09/2019   Procedure: CORONARY STENT INTERVENTION;  Surgeon: Adrian Prows, MD;  Location: [place] [facility];  Service: Cardiovascular;  Laterality: N/A;   LEFT HEART CATH AND CORONARY ANGIOGRAPHY N/A 07/09/2019   Procedure: LEFT HEART CATH AND CORONARY ANGIOGRAPHY and possible intervention;  Surgeon: Adrian Prows, MD;  Location: [facility];  Service: Cardiovascular;  Laterality: N/A;  4: or 4:30 PM today   LUMBAR LAMINECTOMY/DECOMPRESSION MICRODISCECTOMY Left 02/02/2022   Procedure: Left Lumbar two-three, Lumbar five-Sacral one Sublaminar decompression;  Surgeon: Kristeen Miss, MD;  Location: [employer];  Service: Neurosurgery;  Laterality: Left;   NECK SURGERY     TOTAL KNEE ARTHROPLASTY Left 10/07/2020   Procedure: TOTAL KNEE  ARTHROPLASTY;  Surgeon: Paralee Cancel, MD;  Location: [facility];  Service: Orthopedics;  Laterality: Left;  70 mins   TOTAL KNEE REVISION Left 05/13/2022   Procedure: SYNOVECTOMY AND LEFT KNEE POLY-LINER EXCHANGE;  Surgeon: Mcarthur Rossetti, MD;  Location: [facility];  Service: Orthopedics;  Laterality: Left;   Patient Active Problem List   Diagnosis Date Noted   Status post revision of total replacement of left knee 05/13/2022   Degenerative lumbar spinal stenosis 02/02/2022   S/P left TKA 10/07/2020   Left knee OA 10/07/2020   Syncope 01/16/2020   Anemia 01/16/2020   Thrombocytopenia ([place]) 01/16/2020   Paroxysmal atrial fibrillation ([place]) 02/15/2019   Mixed hyperlipidemia 02/15/2019   Bradycardia 02/15/2019   Coronary artery disease involving native coronary artery of native heart with unstable angina pectoris ([place]) 12/20/2018   Dyslipidemia 12/20/2018   Essential hypertension 12/20/2018   PCP: Prince Solian, MD  REFERRING PROVIDER: Mcarthur Rossetti, MD  REFERRING DIAG: [phone] (ICD-10-CM) - Status post revision of total knee, left  ONSET DATE: 05/13/2022 TKA revision  THERAPY DIAG:  No diagnosis found.  Rationale for Evaluation and Treatment Rehabilitation  PERTINENT HISTORY: PMHx:  recent back surgery for ruptured disc 3 months ago per patient report, Lt TKA revision 05/13/22 HTN, MI, A-fib, CAD, L lumbar laminectomy, atherosclerosis, angina, HTN,  PRECAUTIONS: None  SUBJECTIVE:  *** She reports she is feeling the best she has ever been. She reports her knee does not  really hurt and she said her back is just feeling a little tired.   PAIN:  NPRS scale: 1/10 left knee, 0/10 back  Pain location: left knee more in front/medial Pain description: tired, tightness Aggravating factors: housework, vacuuming, knee is worse and stiff in the morning Relieving factors: meds  FALLS:  Has patient fallen in last 6 months? NO  OBJECTIVE: (objective measures completed at  initial evaluation unless otherwise dated)  PATIENT SURVEYS:  Eval: FOTO 43% (predicted 43%)                  SENSATION: Eval: WFL   EDEMA: Mild edema Lt knee   LOWER EXTREMITY/LUMBAR ROM:  AROM Eval Left 12/21/22 Right eval 12/21/22 Left 12/27/22      Lumbar flexion WNL          Lumbar extension extension  25%         Lumbar rotation Right  75%         Lumbar rotation left  75%         Knee flexion 110  110      Knee extension 4 2 4       $ (Blank rows = not tested)     LOWER EXTREMITY MMT:  MMT Right Eval 12/21/22 Left eval 12/21/22  Hip flexion 4+ 4+  Hip extension    Hip abduction 4+ 4+  Hip adduction    Hip internal rotation    Hip external rotation    Knee flexion 5 4+  Knee extension 5 4  Ankle dorsiflexion 4 4  Ankle plantarflexion    Ankle inversion    Ankle eversion     (Blank rows = not tested)       FUNCTIONAL TESTS:  Eval:  5 times sit to stand: 9 seconds without UE support Tandem balance: 10-30 seconds SLS 10 seconds bilat    GAIT: Eval: Mild limp, decreased TKE, independent community ambulator  SPECIAL TESTS:  Slump test: negative  SLR test: negative     TODAY'S TREATMENT: 01/24/23 ***  01/17/23 TherEx:  Nu step seat 5 L6-L4 x 8 min; LEs only DKTC 2 x 30 sec hold Supine lower trunk rotation x3 each side hold for 30 sec Seated lumbar flex into physioball hold for 5 sec x10 Seated march into physioball hip flex and shoulder ext hold for 5 sec x10 each leg  Bridges x10 reps; 5 sec hold Seated SLR 2x10 bilat 5 sec hold at  Sit to stands 2x10  SLS 20 sec x3 bilat  Tandem walking in bars with touchdown support x3 round trips  Rocker board in bars A-P x1 min, lateral x1 min Lateral step ups 6in step x15 bilat Stepback touches 4in step X10 bilat  01/12/23 TherEx:  Nu step seat 5 L6 x 7 min; LEs only Desensitization to bilat knee x1 min with towel, x1 min with pillow case, x22mn with button down shirt, x12m bandage, x1 min moist heat  pack, x1m23mcold pack, x1mi52moist heat pack, x1min62mld pack  DKTC 3 x 30 sec hold Supine lower trunk rotation x3 each side hold for 30 sec Seated march into plyoball hip flex and shoulder ext hold for 5 sec x5 each leg  Bridges x10 reps; 5 sec hold SLR 2x10 bilat   PATIENT EDUCATION:  Education details: PT POC, HEP Person educated: Patient Education method: ExplaConsulting civil engineeronstration, Tactile cues, Verbal cues, and Handouts Education comprehension: verbalized understanding, returned demonstration, and needs further education  HOME EXERCISE PROGRAM:  Access Code: [email or web address] URL: [email or web address] Date: 12/21/2022 Prepared by: Elsie Ra Exercises - Seated Passive Knee Extension  - 3 x daily - 6 x weekly - 1 sets - 20 reps - 5 sec hold - Hooklying Single Knee to Chest Stretch  - 2 x daily - 6 x weekly - 1 sets - 3 reps - 30 sec hold - Supine Lower Trunk Rotation  - 2 x daily - 6 x weekly - 1 sets - 10 reps - 5 sec hold - Supine Bridge  - 2 x daily - 6 x weekly - 1-2 sets - 10 reps - 5 hold - Seated Knee Flexion AAROM  - 2 x daily - 7 x weekly - 10 reps - 5 seconds hold - Deadlift with Resistance  - 2 x daily - 6 x weekly - 1-2 sets - 10 reps- Standing Lumbar Extension at [place]  - 2 x daily - 6 x weekly - 1 sets - 10 reps - 5 sec hold - Standing Tandem Balance with Counter Support  - 2 x daily - 6 x weekly - 1 sets - 3 reps - 30 hold - Standing Single Leg Stance with Unilateral Counter Support  - 2 x daily - 6 x weekly - 1 sets - 5 reps - 10 hold    ASSESSMENT:   CLINICAL IMPRESSION: *** Audie tolerated the treatment session very well today. She had little to no pain so we were able to add in more strength and balance. She still shows some limitations in dynamic balance, lumbar ROM, and LE strength and will benefit from skilled PT to improve in these areas. This is her last visit she had scheduled and was advised to schedule out through the end of  February.   OBJECTIVE IMPAIRMENTS Abnormal gait, decreased activity tolerance, decreased balance, decreased mobility, difficulty walking, decreased ROM, decreased strength, impaired flexibility, and pain.    ACTIVITY LIMITATIONS lifting, bending, sitting, standing, sleeping, stairs, transfers, and dressing   PARTICIPATION LIMITATIONS: cleaning, driving, and shopping   PERSONAL FACTORS   recent lumbar surgery, HTN, MI, A-fib, CAD, L lumbar laminectomy, atherosclerosis, angina, HTN,  are also affecting patient's functional outcome.    REHAB POTENTIAL: Good   CLINICAL DECISION MAKING: Stable/uncomplicated   EVALUATION COMPLEXITY: Low     GOALS: Goals reviewed with patient? Yes   SHORT TERM GOALS: Target date: 01/18/2023    Patient will demonstrate independent use of initial home exercise program to maintain progress from in clinic treatments. Goal status: MET 01/17/23         Long term PT goals (target dates for all long term goals are 6 weeks  02/01/2023  Patient will demonstrate/report pain at worst less than or equal to 2/10 with ADL's.  Goal status: NEW   2. Patient will maintain FOTO outcome > or = 43 % to indicate reduced disability due to condition. Goal status: NEW  3. Pt will improve lumbar ROM to [facility] for improved mobility  Goal status: NEW   4. Pt will improve leg strength grossly to 5/5 bilat for functional strength  Goal status: NEW   5. . Pt will improve left knee extension to 2 degrees or less to improve gait  Goal status: NEW  6. Pt will improve SLS time to 15 seconds on avg to show improved balance  Goal status: NEW   PLAN: PT FREQUENCY: 1-2 times per week    PT DURATION: 6-8 weeks   PLANNED INTERVENTIONS (  unless contraindicated): aquatic PT, Canalith repositioning, cryotherapy, Electrical stimulation, Iontophoresis with 4 mg/ml dexamethasome, Moist heat, traction, Ultrasound, gait training, Therapeutic exercise, balance training, neuromuscular  re-education, patient/family education, prosthetic training, manual techniques, passive ROM, dry needling, taping, vasopnuematic device, vestibular, spinal manipulations, joint manipulations   PLAN FOR NEXT SESSION: *** dynamic balance, general strength, update measurements   Laureen Abrahams, PT, DPT 01/24/23 7:45 AM

## 2023-01-31 ENCOUNTER — Other Ambulatory Visit: Payer: Self-pay

## 2023-01-31 ENCOUNTER — Ambulatory Visit: Payer: Medicare Other | Admitting: Physical Therapy

## 2023-01-31 ENCOUNTER — Encounter: Payer: Self-pay | Admitting: Physical Therapy

## 2023-01-31 DIAGNOSIS — M25562 Pain in left knee: Secondary | ICD-10-CM | POA: Diagnosis not present

## 2023-01-31 DIAGNOSIS — R2689 Other abnormalities of gait and mobility: Secondary | ICD-10-CM | POA: Diagnosis not present

## 2023-01-31 DIAGNOSIS — I25118 Atherosclerotic heart disease of native coronary artery with other forms of angina pectoris: Secondary | ICD-10-CM

## 2023-01-31 DIAGNOSIS — M6281 Muscle weakness (generalized): Secondary | ICD-10-CM | POA: Diagnosis not present

## 2023-01-31 DIAGNOSIS — M5459 Other low back pain: Secondary | ICD-10-CM | POA: Diagnosis not present

## 2023-01-31 DIAGNOSIS — E7800 Pure hypercholesterolemia, unspecified: Secondary | ICD-10-CM

## 2023-01-31 MED ORDER — EZETIMIBE-SIMVASTATIN 10-40 MG PO TABS: 1.0000 | ORAL_TABLET | Freq: Every day | ORAL | 1 refills | Status: DC

## 2023-01-31 MED ORDER — PRALUENT 75 MG/ML ~~LOC~~ SOAJ: 75.0000 mg | SUBCUTANEOUS | 3 refills | Status: DC

## 2023-01-31 NOTE — Therapy (Addendum)
[facility] [address] [place], [place], [address] Phone: [phone]   Fax:  [phone]  Patient Details  Name: Erica Frank MRN: [id number] Date of Birth: [birth year] Referring Provider:  Prince Solian, MD  Encounter Date: 01/31/2023    OUTPATIENT PHYSICAL THERAPY TREATMENT NOTE:   PROGRESS NOTE/Recertification DISCHARGE    Patient Name: Erica Frank MRN: [id number] DOB:[birth year], 82 y.o., female Today's Date: 01/31/2023    Progress Note Reporting Period 12/21/22 to 01/31/2023   See note below for Objective Data and Assessment of Progress/Goals.     END OF SESSION:   PT End of Session - 01/31/23 1441     Visit Number 8    Number of Visits 24    Date for PT Re-Evaluation 04/01/23    Authorization Type [employer] MCR    Progress Note Due on Visit 19    PT Start Time 1433    PT Stop Time 1515    PT Time Calculation (min) 42 min    Activity Tolerance Patient tolerated treatment well    Behavior During Therapy WFL for tasks assessed/performed                   Past Medical History:  Diagnosis Date   Anginal pain ([place]) 1989   Anxiety    Arthritis    Atherosclerosis    Atrial fibrillation ([place])    Coronary artery disease    Dysrhythmia    a-fib   Elevated liver enzymes    GERD (gastroesophageal reflux disease)    Heart attack ([place]) 2020   Heart disease    High cholesterol    Hypertension    NSTEMI (non-ST elevated myocardial infarction) ([place]) 07/09/2019   Past Surgical History:  Procedure Laterality Date   ABDOMINAL HYSTERECTOMY  1983   for endometriosis with [place]   ANGIOPLASTY     ANGIOPLASTY     BACK SURGERY     1990   CARDIAC SURGERY     Catheterization   CORONARY STENT INTERVENTION N/A 07/09/2019   Procedure: CORONARY STENT INTERVENTION;  Surgeon: Adrian Prows, MD;  Location: [facility];  Service: Cardiovascular;  Laterality: N/A;   LEFT HEART CATH AND CORONARY  ANGIOGRAPHY N/A 07/09/2019   Procedure: LEFT HEART CATH AND CORONARY ANGIOGRAPHY and possible intervention;  Surgeon: Adrian Prows, MD;  Location: [facility];  Service: Cardiovascular;  Laterality: N/A;  4: or 4:30 PM today   LUMBAR LAMINECTOMY/DECOMPRESSION MICRODISCECTOMY Left 02/02/2022   Procedure: Left Lumbar two-three, Lumbar five-Sacral one Sublaminar decompression;  Surgeon: Kristeen Miss, MD;  Location: [facility];  Service: Neurosurgery;  Laterality: Left;   NECK SURGERY     TOTAL KNEE ARTHROPLASTY Left 10/07/2020   Procedure: TOTAL KNEE ARTHROPLASTY;  Surgeon: Paralee Cancel, MD;  Location: [facility];  Service: Orthopedics;  Laterality: Left;  70 mins   TOTAL KNEE REVISION Left 05/13/2022   Procedure: SYNOVECTOMY AND LEFT KNEE POLY-LINER EXCHANGE;  Surgeon: Mcarthur Rossetti, MD;  Location: [facility];  Service: Orthopedics;  Laterality: Left;   Patient Active Problem List   Diagnosis Date Noted   Status post revision of total replacement of left knee 05/13/2022   Degenerative lumbar spinal stenosis 02/02/2022   S/P left TKA 10/07/2020   Left knee OA 10/07/2020   Syncope 01/16/2020   Anemia 01/16/2020   Thrombocytopenia ([place]) 01/16/2020   Paroxysmal atrial fibrillation ([place]) 02/15/2019   Mixed hyperlipidemia 02/15/2019   Bradycardia 02/15/2019   Coronary artery disease involving  native coronary artery of native heart with unstable angina pectoris ([place]) 12/20/2018   Dyslipidemia 12/20/2018   Essential hypertension 12/20/2018   PCP: Prince Solian, MD  REFERRING PROVIDER: Mcarthur Rossetti, MD  REFERRING DIAG: [phone] (ICD-10-CM) - Status post revision of total knee, left  ONSET DATE: 05/13/2022 TKA revision  THERAPY DIAG:  Other low back pain  Acute pain of left knee  Muscle weakness (generalized)  Other abnormalities of gait and mobility  Rationale for Evaluation and Treatment Rehabilitation  PERTINENT HISTORY: PMHx:  recent back surgery for ruptured disc 3  months ago per patient report, Lt TKA revision 05/13/22 HTN, MI, A-fib, CAD, L lumbar laminectomy, atherosclerosis, angina, HTN,  PRECAUTIONS: None  SUBJECTIVE: Pt stating her back seems a little better. Pt stating she can do her exercises easier now. Pt with back pain of 2/10 at rest, 4-5/10 with walking. Pt stating her knee is 5/10 and movements make it worse. Pt stating the more she is on her feet the worse her left knee pain is.   PAIN:  NPRS scale: 1/10 left knee, 0/10 back  Pain location: left knee more in front/medial Pain description: tired, tightness Aggravating factors: housework, vacuuming, knee is worse and stiff in the morning Relieving factors: meds  FALLS:  Has patient fallen in last 6 months? NO  OBJECTIVE: (objective measures completed at initial evaluation unless otherwise dated)  PATIENT SURVEYS:  Eval: FOTO 43% (predicted 43%)  01/31/23: FOTO 48%                 SENSATION: Eval: WFL   EDEMA: Mild edema Lt knee   LOWER EXTREMITY/LUMBAR ROM:  AROM Eval Left 12/21/22 Right eval 12/21/22 Left 12/27/22 Left 01/31/23     Lumbar flexion WNL          Lumbar extension extension  25%         Lumbar rotation Right  75%         Lumbar rotation left  75%         Knee flexion 110  110 110     Knee extension 4 2 4 2       (Blank rows = not tested)     LOWER EXTREMITY MMT:  MMT Right Eval 12/21/22 Left eval 12/21/22 Left 01/31/23  Hip flexion 4+ 4+ 4+  Hip extension     Hip abduction 4+ 4+ 4+  Hip adduction     Hip internal rotation     Hip external rotation     Knee flexion 5 4+ 5  Knee extension 5 4 4+  Ankle dorsiflexion 4 4 4+  Ankle plantarflexion     Ankle inversion     Ankle eversion      (Blank rows = not tested)       FUNCTIONAL TESTS:  Eval:  5 times sit to stand: 9 seconds without UE support Tandem balance: 10-30 seconds SLS 10 seconds bilat    GAIT: Eval: Mild limp, decreased TKE, independent community ambulator  SPECIAL TESTS:  Slump  test: negative  SLR test: negative     TODAY'S TREATMENT: 01/31/23 TherEx:  Nu step Level 5 x 8 min; LEs only DKTC 2 x 30 sec hold SKTC x 1 c 30 sec hold Bridges with ball squeeze 2 x 10 Prone trunk rotation with knees at 90 flexion (decreased IR and ER of bilateral hips note) Seated Cat/Camel x 10 Seated trunk extension x 5 holding 10 sec Manual:  STM to lumbar paraspinals  All Measurements  performed see above   01/17/23 TherEx:  Nu step seat 5 L6-L4 x 8 min; LEs only DKTC 2 x 30 sec hold Supine lower trunk rotation x3 each side hold for 30 sec Seated lumbar flex into physioball hold for 5 sec x10 Seated march into physioball hip flex and shoulder ext hold for 5 sec x10 each leg  Bridges x10 reps; 5 sec hold Seated SLR 2x10 bilat 5 sec hold at  Sit to stands 2x10  SLS 20 sec x3 bilat  Tandem walking in bars with touchdown support x3 round trips  Rocker board in bars A-P x1 min, lateral x1 min Lateral step ups 6in step x15 bilat Stepback touches 4in step X10 bilat  01/12/23 TherEx:  Nu step seat 5 L6 x 7 min; LEs only Desensitization to bilat knee x1 min with towel, x1 min with pillow case, x66min with button down shirt, x52min bandage, x1 min moist heat pack, x43min cold pack, x40min moist heat pack, x73min cold pack  DKTC 3 x 30 sec hold Supine lower trunk rotation x3 each side hold for 30 sec Seated march into plyoball hip flex and shoulder ext hold for 5 sec x5 each leg  Bridges x10 reps; 5 sec hold SLR 2x10 bilat     PATIENT EDUCATION:  Education details: PT POC, HEP Person educated: Patient Education method: Consulting civil engineer, Demonstration, Corporate treasurer cues, Verbal cues, and Handouts Education comprehension: verbalized understanding, returned demonstration, and needs further education     HOME EXERCISE PROGRAM:  Access Code: [email or web address] URL: [email or web address] Date: 12/21/2022 Prepared by: Elsie Ra Exercises - Seated Passive Knee Extension  - 3 x  daily - 6 x weekly - 1 sets - 20 reps - 5 sec hold - Hooklying Single Knee to Chest Stretch  - 2 x daily - 6 x weekly - 1 sets - 3 reps - 30 sec hold - Supine Lower Trunk Rotation  - 2 x daily - 6 x weekly - 1 sets - 10 reps - 5 sec hold - Supine Bridge  - 2 x daily - 6 x weekly - 1-2 sets - 10 reps - 5 hold - Seated Knee Flexion AAROM  - 2 x daily - 7 x weekly - 10 reps - 5 seconds hold - Deadlift with Resistance  - 2 x daily - 6 x weekly - 1-2 sets - 10 reps- Standing Lumbar Extension at Wall - Forearms  - 2 x daily - 6 x weekly - 1 sets - 10 reps - 5 sec hold - Standing Tandem Balance with Counter Support  - 2 x daily - 6 x weekly - 1 sets - 3 reps - 30 hold - Standing Single Leg Stance with Unilateral Counter Support  - 2 x daily - 6 x weekly - 1 sets - 5 reps - 10 hold    ASSESSMENT:   CLINICAL IMPRESSION: Pt arriving today reporting 5/10 left knee pain and 4-5/10 low back pain. Pt feels she has made progress since starting therapy but still reporting intermittent pain. Pt stating back pain is worse after increased activity. Pt's active left knee flexion is 2-110 degrees.   OBJECTIVE IMPAIRMENTS Abnormal gait, decreased activity tolerance, decreased balance, decreased mobility, difficulty walking, decreased ROM, decreased strength, impaired flexibility, and pain.    ACTIVITY LIMITATIONS lifting, bending, sitting, standing, sleeping, stairs, transfers, and dressing   PARTICIPATION LIMITATIONS: cleaning, driving, and shopping   PERSONAL FACTORS   recent lumbar surgery, HTN, MI, A-fib, CAD, L lumbar laminectomy, atherosclerosis, angina, HTN,  are also affecting patient's functional outcome.    REHAB POTENTIAL: Good   CLINICAL DECISION MAKING: Stable/uncomplicated   EVALUATION COMPLEXITY: Low     GOALS: Goals reviewed with patient? Yes   SHORT TERM GOALS: Target date: 01/18/2023    Patient will demonstrate independent use of initial home exercise program to maintain progress from  in clinic treatments. Goal status: MET 01/17/23         Long term PT goals (target dates for all long term goals are 6 weeks  04/01/2023  Patient will demonstrate/report pain at worst less than or equal to 2/10 with ADL's.  Goal status: on-going 01/31/23   2. Patient will maintain FOTO outcome > or = 43 % to indicate reduced disability due to condition. Goal status: MET 01/31/23  3. Pt will improve lumbar ROM to [facility] for improved mobility  Goal status: On=going 01/31/23   4. Pt will improve leg strength grossly to 5/5 bilat for functional strength  Goal status: On=going 01/31/23   5. . Pt will improve left knee extension to 2 degrees or less to improve gait  Goal status: On=going 01/31/23  6. Pt will improve SLS time to 15 seconds on avg to show improved balance  Goal status: On=going 01/31/23   PLAN: PT FREQUENCY: 1-2 times per week    PT DURATION: 6-8 weeks   PLANNED INTERVENTIONS (unless contraindicated): aquatic PT, Canalith repositioning, cryotherapy, Electrical stimulation, Iontophoresis with 4 mg/ml dexamethasome, Moist heat, traction, Ultrasound, gait training, Therapeutic exercise, balance training, neuromuscular re-education, patient/family education, prosthetic training, manual techniques, passive ROM, dry needling, taping, vasopnuematic device, vestibular, spinal manipulations, joint manipulations   PLAN FOR NEXT SESSION: dynamic balance, general strength, update measurements, manual as needed    Kearney Hard, PT, MPT 01/31/23 3:13 PM   PHYSICAL THERAPY DISCHARGE SUMMARY  Visits from Start of Care: 8  Current functional level related to goals / functional outcomes: See note   Remaining deficits: See note   Education / Equipment: HEP  Patient goals were partially met. Patient is being discharged due to not returning since the last visit.  Scot Jun, PT, DPT, OCS, ATC 03/10/23  2:07 PM

## 2023-02-02 ENCOUNTER — Encounter: Payer: Medicare Other | Admitting: Physical Therapy

## 2023-02-07 ENCOUNTER — Encounter: Payer: Medicare Other | Admitting: Physical Therapy

## 2023-02-09 ENCOUNTER — Encounter: Payer: Medicare Other | Admitting: Physical Therapy

## 2023-02-10 ENCOUNTER — Encounter: Payer: Self-pay | Admitting: Radiology

## 2023-03-22 ENCOUNTER — Other Ambulatory Visit: Payer: Self-pay

## 2023-03-22 DIAGNOSIS — E7800 Pure hypercholesterolemia, unspecified: Secondary | ICD-10-CM

## 2023-03-22 DIAGNOSIS — I25118 Atherosclerotic heart disease of native coronary artery with other forms of angina pectoris: Secondary | ICD-10-CM

## 2023-03-22 MED ORDER — PRALUENT 75 MG/ML ~~LOC~~ SOAJ: 75.0000 mg | SUBCUTANEOUS | 3 refills | Status: DC

## 2023-03-29 ENCOUNTER — Ambulatory Visit: Payer: Medicare Other | Admitting: Orthopaedic Surgery

## 2023-03-30 ENCOUNTER — Ambulatory Visit: Payer: Medicare Other | Admitting: Orthopaedic Surgery

## 2023-03-30 ENCOUNTER — Ambulatory Visit: Payer: Medicare Other | Admitting: Physician Assistant

## 2023-03-30 ENCOUNTER — Encounter: Payer: Self-pay | Admitting: Physician Assistant

## 2023-03-30 DIAGNOSIS — M1711 Unilateral primary osteoarthritis, right knee: Secondary | ICD-10-CM

## 2023-03-30 DIAGNOSIS — Z96652 Presence of left artificial knee joint: Secondary | ICD-10-CM | POA: Diagnosis not present

## 2023-03-30 NOTE — Progress Notes (Signed)
HPI: 63 [name] comes in today follow-up bilateral knees.  She states both knees bother her.  Out of the right knee she feels the injection is helping.  She does not feel that she needs any type of injection in the right knee at this point in time.  She does have some tooth ache like pain in the tib-fib area of the right leg at night.  Does not go past the ankle.  She has had no injury to the knee or right lower leg.  She still has pain in her left knee which she underwent a totalkneearthroplasty 10/07/2020.  She takes tramadol and Tylenol and uses Tiger balm seems to alleviate her pain in both knees.  She does have some back pain which she sees Dr. Danielle Frank for.  She denies any numbness tingling down either leg.  She also feels that the exercises she does at the gym helps with her knees temporarily.  Review of systems see HPI otherwise negative   Physical exam: Bilateral knees good range of motion both knees.  No abnormal warmth erythema or effusion.  Left knee well-healed surgical incision.  No instability left knee with valgus varus stressing.  Right leg slight area of ecchymosis over the proximal tib-fib.  Otherwise calves are supple nontender.  No rashes skin lesions ulcerations bilateral lower extremities.  Impression: Right knee osteoarthritis History of left total knee arthroplasty with revision poly exchange.   Plan: Advised her to wear compression hose.  Continue to work on Dance movement psychotherapist.  Follow-up with [place] as needed.  She knows to wait at least 3 months between cortisone injections in the right knee.  Questions were encouraged and

## 2023-06-14 ENCOUNTER — Other Ambulatory Visit: Payer: Medicare Other

## 2023-06-14 ENCOUNTER — Ambulatory Visit: Payer: Medicare Other

## 2023-06-14 DIAGNOSIS — I6523 Occlusion and stenosis of bilateral carotid arteries: Secondary | ICD-10-CM

## 2023-06-15 NOTE — Progress Notes (Signed)
Carotid artery duplex 06/14/2023: Duplex suggests stenosis in the right internal carotid artery (16-49%). <50% stenosis in the right common carotid artery. <50% stenosis in the right external carotid artery. Duplex suggests stenosis in the left internal carotid artery (16-49%). Duplex suggests <50% stenosis in the left external carotid artery.  There is diffuse heterogenous plaque noted in the bilateral common carotid artery and carotid bulb and homogenous plaque in the ICA. Antegrade right vertebral artery flow. Antegrade left vertebral artery flow. No significant change from 03/24/2022. Follow up in one year is appropriate if clinically indicated.

## 2023-06-22 ENCOUNTER — Other Ambulatory Visit (INDEPENDENT_AMBULATORY_CARE_PROVIDER_SITE_OTHER): Payer: Medicare Other

## 2023-06-22 ENCOUNTER — Ambulatory Visit: Payer: Medicare Other | Admitting: Cardiology

## 2023-06-22 ENCOUNTER — Ambulatory Visit: Payer: Medicare Other | Admitting: Orthopaedic Surgery

## 2023-06-22 DIAGNOSIS — M25562 Pain in left knee: Secondary | ICD-10-CM

## 2023-06-22 DIAGNOSIS — Z96652 Presence of left artificial knee joint: Secondary | ICD-10-CM

## 2023-06-22 DIAGNOSIS — G8929 Other chronic pain: Secondary | ICD-10-CM

## 2023-06-22 NOTE — Progress Notes (Signed)
The patient continues to deal with chronic left knee pain.  She had a primary knee replacement done by one of my colleagues in town in November 2021.  After continued pain she sought a second opinion with [place].  Will try to treat this conservative for a long period of time and then took her to the operating room in June of last year for synovectomy and polyliner exchange.  We did not find any evidence of her prosthetic loosening.  She appropriately saw one of our colleagues yesterday in town who is a fellowship trained joint specialist and he did provide a steroid injection over the pes bursa of her knee.  He is drawn fluid off of her knee and send this appropriately for evaluation.  He is ordered a bone scan as well and I think this is all appropriate.  She has been dealing with a leg length difference due to significant hip arthritis and less than her gait off as well.  On exam today both hips move smoothly and fluidly but previous x-rays do show arthritis in both hips.  Standing views of her left knee today show no evidence of prosthetic loosening.  There is no significant knee joint effusion and her knee moves well but is very painful to her and it feels grossly ligamentously stable.  We will obtain a three-phase bone scan to again assess for prosthetic loosening.  I would like to send her to Dr. Alvester Morin for an intra-articular steroid injection in her left hip joint.  I would like to see her back in about 2 weeks.  At that visit we can go over the bone scan and I would like a standing low AP pelvis as well to evaluate her hips.  She agrees with this treatment plan.  All question concerns were addressed and answered.

## 2023-06-22 NOTE — Addendum Note (Signed)
Addended by: Barbette Or on: 06/22/2023 04:04 PM   Modules accepted: Orders

## 2023-06-27 ENCOUNTER — Encounter: Payer: Self-pay | Admitting: Cardiology

## 2023-06-27 ENCOUNTER — Ambulatory Visit: Payer: Medicare Other | Admitting: Cardiology

## 2023-06-27 VITALS — BP 119/61 | HR 50 | Resp 16 | Ht 60.0 in | Wt 129.6 lb

## 2023-06-27 DIAGNOSIS — I25118 Atherosclerotic heart disease of native coronary artery with other forms of angina pectoris: Secondary | ICD-10-CM

## 2023-06-27 DIAGNOSIS — I480 Paroxysmal atrial fibrillation: Secondary | ICD-10-CM

## 2023-06-27 DIAGNOSIS — I10 Essential (primary) hypertension: Secondary | ICD-10-CM

## 2023-06-27 DIAGNOSIS — E7800 Pure hypercholesterolemia, unspecified: Secondary | ICD-10-CM

## 2023-06-27 DIAGNOSIS — I6523 Occlusion and stenosis of bilateral carotid arteries: Secondary | ICD-10-CM

## 2023-06-27 NOTE — Progress Notes (Signed)
Primary Physician/Referring:  Erica Greathouse, MD  Patient ID: Erica Frank, female    DOB: Feb 26, 1941, 82 y.o.   MRN: [id number]  Chief Complaint  Patient presents with   Coronary Artery Disease   Follow-up   HPI:    Erica Frank  is a 82 y.o. emale  with hypertension, hyperlipidemia, asymptomatic chronic sinus bradycardia, and one episode of atrial fibrillation on 01/25/2019 S/P cardioversion in the ED, CAD and balloon angioplasty in 1989 and 1990, NSTEMI on 07/09/2019 S/P stenting to the mid LAD. She did not tolerate Repatha due to severe myalgias and arthralgias, this was switched over to Praluent which she is tolerating and has had no problems with this.  She is also on Vytorin.  This is a 44-month office visit.  Patient has had her anginal symptoms come back in the form of pain in the middle of the back, since addition of isosorbide mononitrate she has not had any recurrence.  She continues to remain active.  Arthritis in bilateral knee and also now developing arthritis in her hip continues to bother her significantly.  States that she feels tired due to constant pain.  Past Medical History:  Diagnosis Date   Anginal pain (HCC) 1989   Anxiety    Arthritis    Atherosclerosis    Atrial fibrillation (HCC)    Coronary artery disease    Dysrhythmia    a-fib   Elevated liver enzymes    GERD (gastroesophageal reflux disease)    Heart attack (HCC) 2020   Heart disease    High cholesterol    Hypertension    NSTEMI (non-ST elevated myocardial infarction) (HCC) 07/09/2019   Past Surgical History:  Procedure Laterality Date   ABDOMINAL HYSTERECTOMY  1983   for endometriosis with Burch   ANGIOPLASTY     ANGIOPLASTY     BACK SURGERY     1990   CARDIAC SURGERY     Catheterization   CORONARY STENT INTERVENTION N/A 07/09/2019   Procedure: CORONARY STENT INTERVENTION;  Surgeon: Yates Decamp, MD;  Location: [facility];  Service: Cardiovascular;  Laterality: N/A;    LEFT HEART CATH AND CORONARY ANGIOGRAPHY N/A 07/09/2019   Procedure: LEFT HEART CATH AND CORONARY ANGIOGRAPHY and possible intervention;  Surgeon: Yates Decamp, MD;  Location: [facility];  Service: Cardiovascular;  Laterality: N/A;  4: or 4:30 PM today   LUMBAR LAMINECTOMY/DECOMPRESSION MICRODISCECTOMY Left 02/02/2022   Procedure: Left Lumbar two-three, Lumbar five-Sacral one Sublaminar decompression;  Surgeon: Barnett Abu, MD;  Location: [facility] OR;  Service: Neurosurgery;  Laterality: Left;   NECK SURGERY     TOTAL KNEE ARTHROPLASTY Left 10/07/2020   Procedure: TOTAL KNEE ARTHROPLASTY;  Surgeon: Durene Romans, MD;  Location: [facility];  Service: Orthopedics;  Laterality: Left;  70 mins   TOTAL KNEE REVISION Left 05/13/2022   Procedure: SYNOVECTOMY AND LEFT KNEE POLY-LINER EXCHANGE;  Surgeon: Kathryne Hitch, MD;  Location: [facility] OR;  Service: Orthopedics;  Laterality: Left;   Family History  Problem Relation Age of Onset   Uterine cancer Mother    Hypertension Brother    Heart disease Brother    Breast cancer Cousin     Social History   Tobacco Use   Smoking status: Never   Smokeless tobacco: Never  Substance Use Topics   Alcohol use: Not Currently    Alcohol/week: 3.0 standard drinks of alcohol    Types: 3 Glasses of wine per week    Comment: "I havent in  several months" 05/06/22   Marital Status: Widowed  ROS  Review of Systems  Constitutional: Positive for malaise/fatigue.  Cardiovascular:  Negative for dyspnea on exertion, leg swelling and syncope.  Musculoskeletal:  Positive for arthritis (bilateral knee) and joint pain (bilateral knee pain).  Gastrointestinal:  Negative for melena.  All other systems reviewed and are negative.  Objective  Blood pressure 119/61, pulse (!) 50, resp. rate 16, height 5' (1.524 m), weight 129 lb 9.6 oz (58.8 kg), SpO2 96%.     06/27/2023    1:08 PM 12/08/2022    9:22 AM 06/05/2022   10:30 PM  Vitals with BMI  Height 5\' 0"  5\' 0"    Weight  129 lbs 10 oz 126 lbs 6 oz   BMI 25.31 24.69   Systolic 119 108 956  Diastolic 61 68 85  Pulse 50 71 63     Physical Exam Constitutional:      Appearance: She is well-developed.  Neck:     Vascular: No carotid bruit or JVD.  Cardiovascular:     Rate and Rhythm: Regular rhythm. Bradycardia present.     Pulses: Intact distal pulses.     Heart sounds: No murmur heard.    No gallop.  Pulmonary:     Effort: Pulmonary effort is normal. No accessory muscle usage.     Breath sounds: Normal breath sounds.  Abdominal:     General: Bowel sounds are normal.     Palpations: Abdomen is soft.  Musculoskeletal:        General: No swelling.  Skin:    Capillary Refill: Capillary refill takes less than 2 seconds.    Laboratory examination:   External labs:   Cholesterol, total 107.000 m 03/09/2023 HDL 62.000 mg 03/09/2023 LDL 33.000 mg 03/09/2023 Triglycerides 59.000 mg 03/09/2023  Hemoglobin 12.600 g/d 06/05/2022 Platelets 357.000 K/ 06/05/2022  Creatinine, Serum 0.800 mg/ 11/08/2022 CrCl Est 39.62 11/08/2022 eGFR 60.100 03/09/2023  Potassium 4.500 mEq 03/09/2023 ALT (SGPT) 19.000 IU/ 03/09/2023  TSH 1.480 03/09/2023   Radiology:   CT Head Wo Contrast 01/16/2020: No acute intracranial abnormality noted. Diffuse mucosal thickening within the right maxillary antrum.  Cardiac Studies:   Coronary Angiography & Balloon Angioplasty of LAD in 1989 and 1990.   DCCV 01/25/2019: 120J x 1 to NSR.  Lexiscan myoview stress test 02/26/2019:  1. Lexiscan stress test was performed. Exercise capacity was not assessed. No stress symptoms reported. Resting blood pressure was 110/70 mmHg and peak effect blood pressure was 146/60 mmHg. The resting and stress electrocardiogram demonstrated normal sinus rhythm, normal  conduction, occasional PAC, and normal repolarization.   Stress EKG is non diagnostic for ischemia as it is a pharmacologic stress.  2. The overall quality of the study is good. There is no evidence  of abnormal lung activity. Stress and rest SPECT images demonstrate homogeneous tracer distribution throughout the myocardium. Gated SPECT imaging reveals normal myocardial thickening and wall motion. The left ventricular ejection fraction was normal (52%).   3. Low risk study.  Coronary Angiography 07/09/2019: Moderate amount of diffuse coronary calcification noted in all coronary vessels including left main.  Right coronary artery and ramus intermediate showed mid 30% stenosis.  Mid LAD had a focal moderately calcified high-grade 95% stenosis S/P  STENT SYNERGY DES 3X24. Maximum pressure: 16 atm. Inflation time: 75 sec. Stent strut is well apposed.  There was residual 0% stenosis with maintenance of TIMI-3 TIMI-3 flow  Echocardiogram 04/23/2019: Normal LV systolic function with EF 59%. Left ventricle cavity is  normal in size. Normal global wall motion. Calculated EF 59%. Moderate (Grade II) mitral regurgitation. E-wave dominant mitral inflow. Mild tricuspid regurgitation. No evidence of pulmonary hypertension.  Event monitor 02/01/2020 - 03/01/2020.  Baseline sample showed Sinus Rhythm w/Artifact with a heart rate of 61.5 bpm. There were 1 critical, 0 serious, and 5 stable events that occurred.  4 patient activated events were accidental push or no symptoms reported revealed normal sinus rhythm. There was a 11 beat NSVT noted at the rate of 178 bpm on 02/04/2020 at 5:41 AM.  Otherwise no heart block, no atrial fibrillation, rare PVCs noted.  Carotid artery duplex 06/14/2023: Duplex suggests stenosis in the right internal carotid artery (16-49%). <50% stenosis in the right common carotid artery. <50% stenosis in the right external carotid artery. Duplex suggests stenosis in the left internal carotid artery (16-49%). Duplex suggests <50% stenosis in the left external carotid artery.  There is diffuse heterogenous plaque noted in the bilateral common carotid artery and carotid bulb and homogenous  plaque in the ICA. Antegrade right vertebral artery flow. Antegrade left vertebral artery flow. No significant change from 03/24/2022. Follow up in one year is appropriate if clinically indicated.  EKG  EKG 06/27/2023: Marked sinus bradycardia at rate of 47 bpm otherwise normal EKG.  Compared to 12/08/2022, heart rate was 55 bpm previously.  Medications and allergies   Allergies  Allergen Reactions   Hydrocodone     "Makes her feel crazy" Depressed   Repatha [Evolocumab] Diarrhea    Current Outpatient Medications:    acetaminophen (TYLENOL) 500 MG tablet, Take 1,000 mg by mouth daily as needed (pain)., Disp: , Rfl:    Alirocumab (PRALUENT) 75 MG/ML SOAJ, Inject 1 mL (75 mg total) into the skin every 14 (fourteen) days., Disp: 6 mL, Rfl: 3   allopurinol (ZYLOPRIM) 100 MG tablet, Take 100 mg by mouth daily., Disp: , Rfl:    apixaban (ELIQUIS) 5 MG TABS tablet, Take 1 tablet (5 mg total) by mouth 2 (two) times daily., Disp: 180 tablet, Rfl: 3   BIOTIN PO, Take 1 tablet by mouth daily. Hair, skin & nails, Disp: , Rfl:    ezetimibe-simvastatin (VYTORIN) 10-40 MG tablet, Take 1 tablet by mouth daily., Disp: 90 tablet, Rfl: 1   isosorbide mononitrate (IMDUR) 60 MG 24 hr tablet, TAKE 1 TABLET(60 MG) BY MOUTH DAILY, Disp: 90 tablet, Rfl: 3   lisinopril (ZESTRIL) 5 MG tablet, TAKE 1 TABLET(5 MG) BY MOUTH DAILY, Disp: 90 tablet, Rfl: 3   nitroGLYCERIN (NITROSTAT) 0.4 MG SL tablet, Place 1 tablet (0.4 mg total) under the tongue every 5 (five) minutes as needed for chest pain., Disp: 25 tablet, Rfl: 2   Polyvinyl Alcohol-Povidone PF (REFRESH) 1.4-0.6 % SOLN, Place 1 drop into both eyes daily as needed (Dry eyes)., Disp: , Rfl:    traMADol (ULTRAM) 50 MG tablet, Take 100 mg by mouth daily., Disp: , Rfl:    traZODone (DESYREL) 50 MG tablet, Take 50 mg by mouth at bedtime as needed., Disp: , Rfl:    Assessment     ICD-10-CM   1. Coronary artery disease of native artery of native heart with stable  angina pectoris (HCC)  I25.118 EKG 12-Lead    2. Asymptomatic bilateral carotid artery stenosis  I65.23 PCV CAROTID DUPLEX (BILATERAL)    3. Pure hypercholesterolemia  E78.00     4. Paroxysmal atrial fibrillation (HCC)  I48.0     5. Essential hypertension  I10        No  orders of the defined types were placed in this encounter.   Medications Discontinued During This Encounter  Medication Reason   CALCIUM PO Patient Preference   DULoxetine (CYMBALTA) 60 MG capsule Change in therapy    Recommendations:   Ms. Jearline Hirschhorn  is a 82 y.o. female  with hypertension, hyperlipidemia, asymptomatic chronic sinus bradycardia, and one episode of atrial fibrillation on 01/25/2019 S/P cardioversion in the ED, CAD and balloon angioplasty in 1989 and 1990, NSTEMI on 07/09/2019 S/P stenting to the mid LAD. She did not tolerate Repatha due to severe myalgias and arthralgias, this was switched over to Praluent which she is tolerating and has had no problems with this.  She is also on Vytorin.  This is a 27-month office visit.  1. Coronary artery disease of native artery of native heart with stable angina pectoris [facility]) Patient has not had any significant episodes of angina, she has not used any sublingual nitroglycerin in the last 3 to 4 months.  No change in EKG, continues to have asymptomatic sinus bradycardia.  She is on nitrates, not on beta-blocker in view of bradycardia.  She feels fatigued in general related to chronic pain in her bilateral knee and also low developing arthritis in her hip.  She is concerned about having redo left knee replacement.  She may need right knee replacement soon as well. - EKG 12-Lead  2. Asymptomatic bilateral carotid artery stenosis I reviewed the results of the carotid artery duplex, very mild disease bilaterally.  She is on appropriate medical therapy.  Will continue to do surveillance duplex in 1 year. - PCV CAROTID DUPLEX (BILATERAL); Future  3. Pure  hypercholesterolemia I reviewed the results of the lipid profile testing that was done by her PCP, since being on Praluent along with Vytorin combination, she has done well and lipids are well-controlled.  Will continue the same for now.  4. Paroxysmal atrial fibrillation (HCC) In view of patient preference and high CHA2DS2-VASc risk score, presently on apixaban, continue the same.  No bleeding diathesis.  Hemoglobin has remained stable.  5. Essential hypertension Blood pressure is well-controlled, she is on an ACE inhibitor.  Other orders - traZODone (DESYREL) 50 MG tablet; Take 50 mg by mouth at bedtime as needed.   I will see her back annually for follow-up.    Yates Decamp, MD, [facility] 06/27/2023, 1:36 PM Office: [phone] Pager: [phone]

## 2023-07-06 ENCOUNTER — Ambulatory Visit: Payer: Medicare Other | Admitting: Orthopaedic Surgery

## 2023-07-11 ENCOUNTER — Encounter (HOSPITAL_COMMUNITY)
Admission: RE | Admit: 2023-07-11 | Discharge: 2023-07-11 | Disposition: A | Discharge status: Home / Self Care | Payer: Medicare Other | Source: Ambulatory Visit | Attending: Orthopaedic Surgery | Admitting: Orthopaedic Surgery

## 2023-07-11 DIAGNOSIS — Z96652 Presence of left artificial knee joint: Secondary | ICD-10-CM | POA: Diagnosis present

## 2023-07-11 DIAGNOSIS — M25562 Pain in left knee: Secondary | ICD-10-CM | POA: Diagnosis present

## 2023-07-11 DIAGNOSIS — G8929 Other chronic pain: Secondary | ICD-10-CM

## 2023-07-11 MED ORDER — TECHNETIUM TC 99M MEDRONATE IV KIT
20.4000 | PACK | Freq: Once | INTRAVENOUS | Status: AC | PRN
  Administered 2023-07-11: 20.4 via INTRAVENOUS

## 2023-07-16 ENCOUNTER — Encounter: Payer: Self-pay | Admitting: Cardiology

## 2023-07-25 ENCOUNTER — Other Ambulatory Visit: Payer: Self-pay

## 2023-07-25 ENCOUNTER — Ambulatory Visit: Payer: Medicare Other | Admitting: Orthopaedic Surgery

## 2023-07-25 ENCOUNTER — Telehealth: Payer: Self-pay | Admitting: Physical Medicine and Rehabilitation

## 2023-07-25 DIAGNOSIS — M25562 Pain in left knee: Secondary | ICD-10-CM

## 2023-07-25 DIAGNOSIS — Z96652 Presence of left artificial knee joint: Secondary | ICD-10-CM | POA: Diagnosis not present

## 2023-07-25 DIAGNOSIS — G8929 Other chronic pain: Secondary | ICD-10-CM | POA: Diagnosis not present

## 2023-07-25 NOTE — Progress Notes (Signed)
The patient is now over 3 years out from her left knee replacement was done elsewhere.  We then took her to the operating room in June of last year and performed a synovectomy and polyliner exchange.  She is still dealt with chronic pain from that knee.  She is 82 years old.  When I saw her last month we did order a three-phase bone scan to rule out prosthetic loosening and she is here to go over that today.  In the interim though she did let me know that the week before she saw me she had a pes bursa injection that one of my orthopedic colleagues did in her knee on that left side in early July.  She says between that and a compression knee sleeve she is wearing she is feeling better overall.  We have also followed her for right knee arthritis.  On exam her left knee shows no effusion.  She has some pain over the pes bursa but is minimal and she has lateral pain.  The three-phase bone scan showed no evidence of prosthetic loosening or synovitis.  Her knee is stable on exam and has excellent range of motion.  From my standpoint I think she should try a copper fit knee sleeve since the generic knee sleeve she has is helping.  We can see her back in 2 months to consider repeat pes bursa injection with her left knee and intra-articular injection of a steroid in her right knee if she still having problems.  She agrees to the treatment plan.

## 2023-07-25 NOTE — Telephone Encounter (Signed)
Appointment cancelled

## 2023-07-25 NOTE — Telephone Encounter (Signed)
Pt asked to cancel appt Dr Alvester Morin. No return call to confirm.

## 2023-07-28 ENCOUNTER — Ambulatory Visit: Payer: Medicare Other | Admitting: Physical Medicine and Rehabilitation

## 2023-08-05 ENCOUNTER — Other Ambulatory Visit: Payer: Self-pay | Admitting: Internal Medicine

## 2023-08-05 DIAGNOSIS — Z1231 Encounter for screening mammogram for malignant neoplasm of breast: Secondary | ICD-10-CM

## 2023-08-14 IMAGING — CR DG HIP (WITH OR WITHOUT PELVIS) 2-3V*L*
3 series · 3 of 3 positions shown · non-contrast
Comparison: None.

CLINICAL DATA: Left hip and lower back pain.

EXAM:
DG HIP (WITH OR WITHOUT PELVIS) 2-3V LEFT

[pelvis ap]
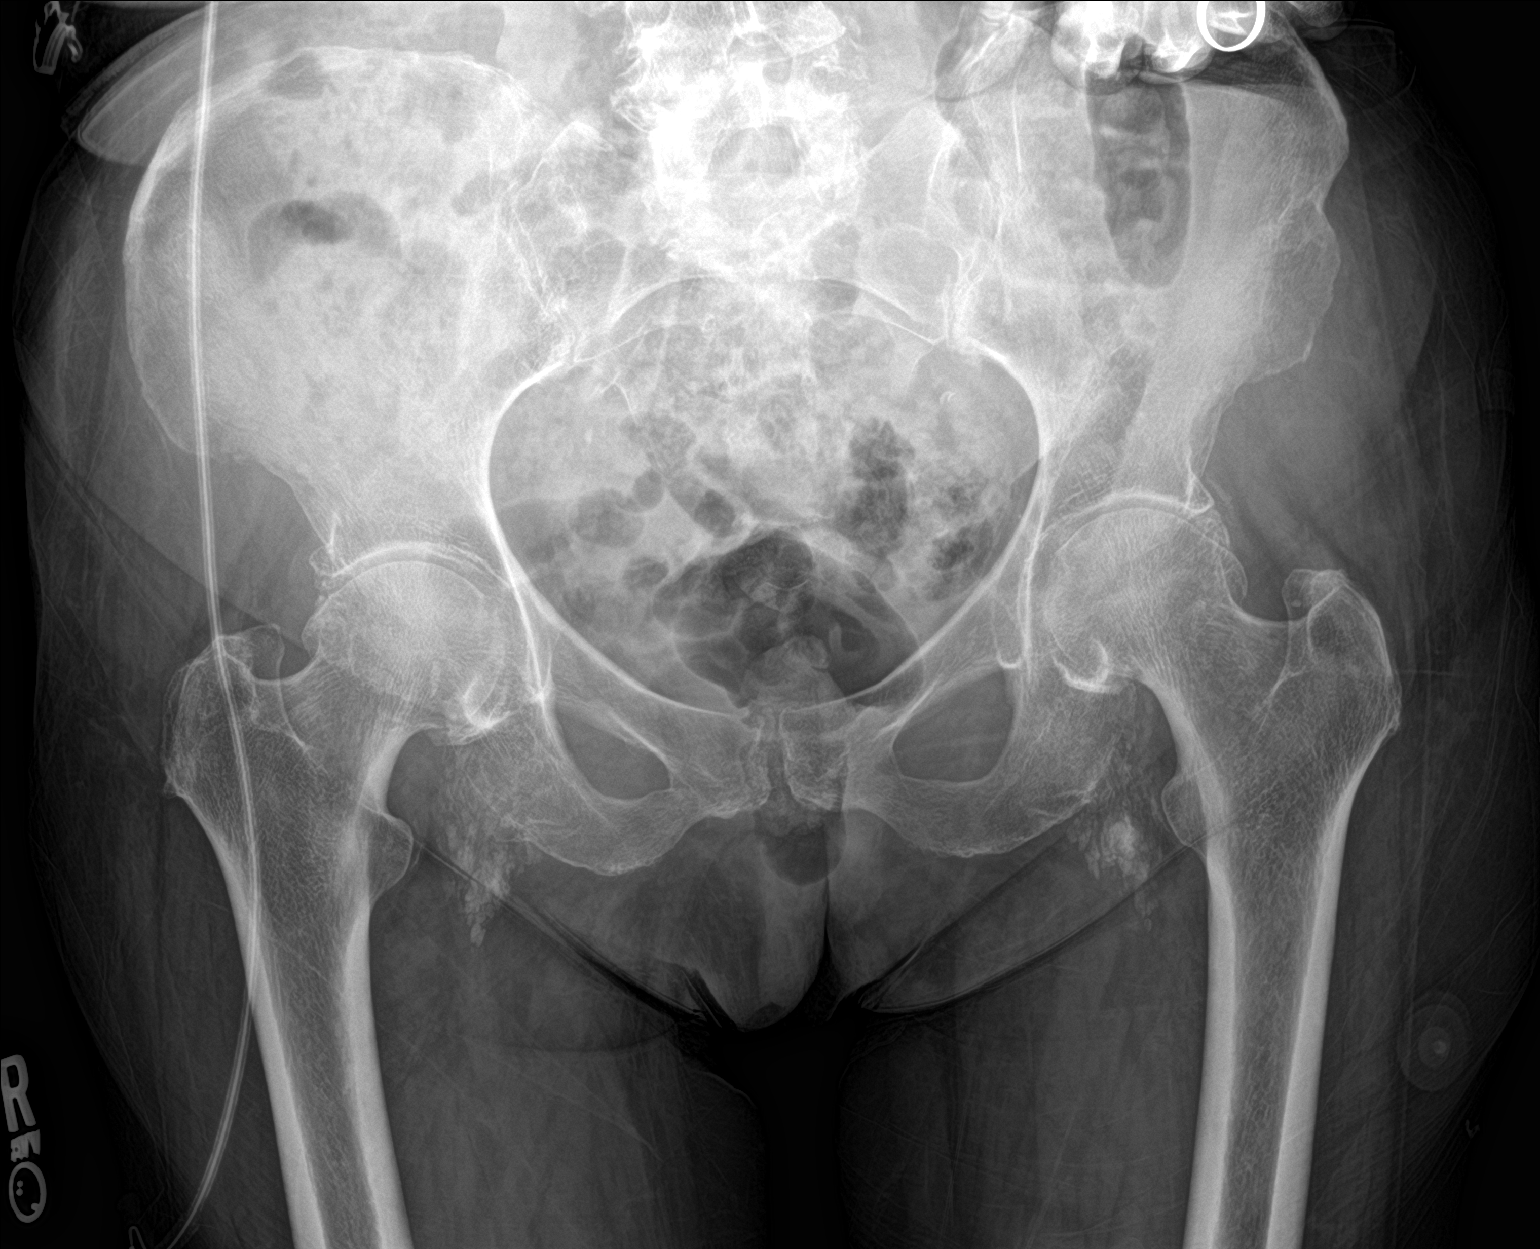

[hip ap]
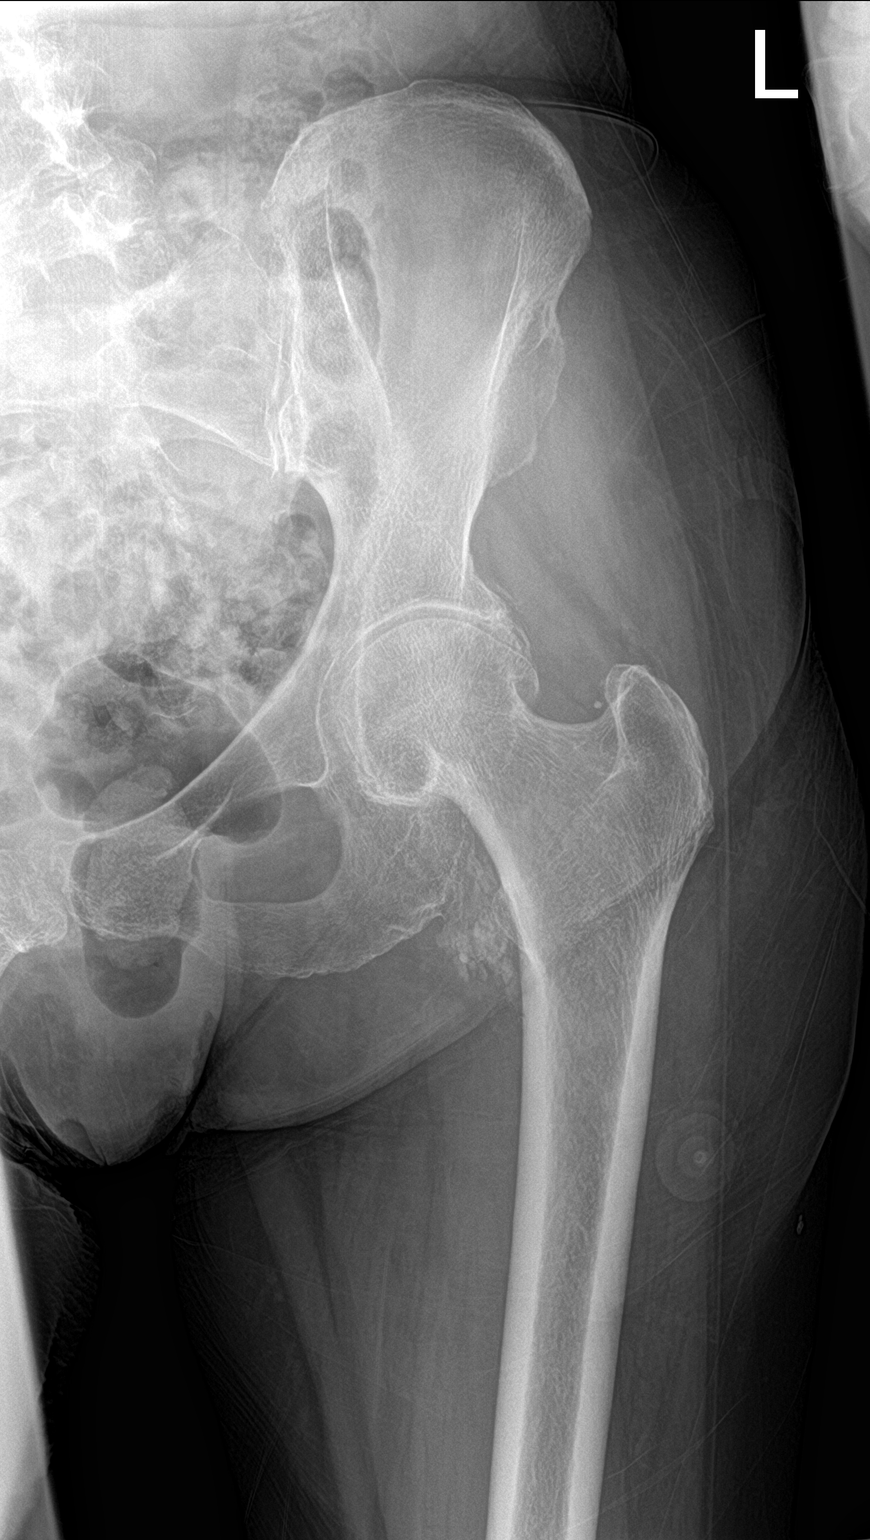

[hip lat]
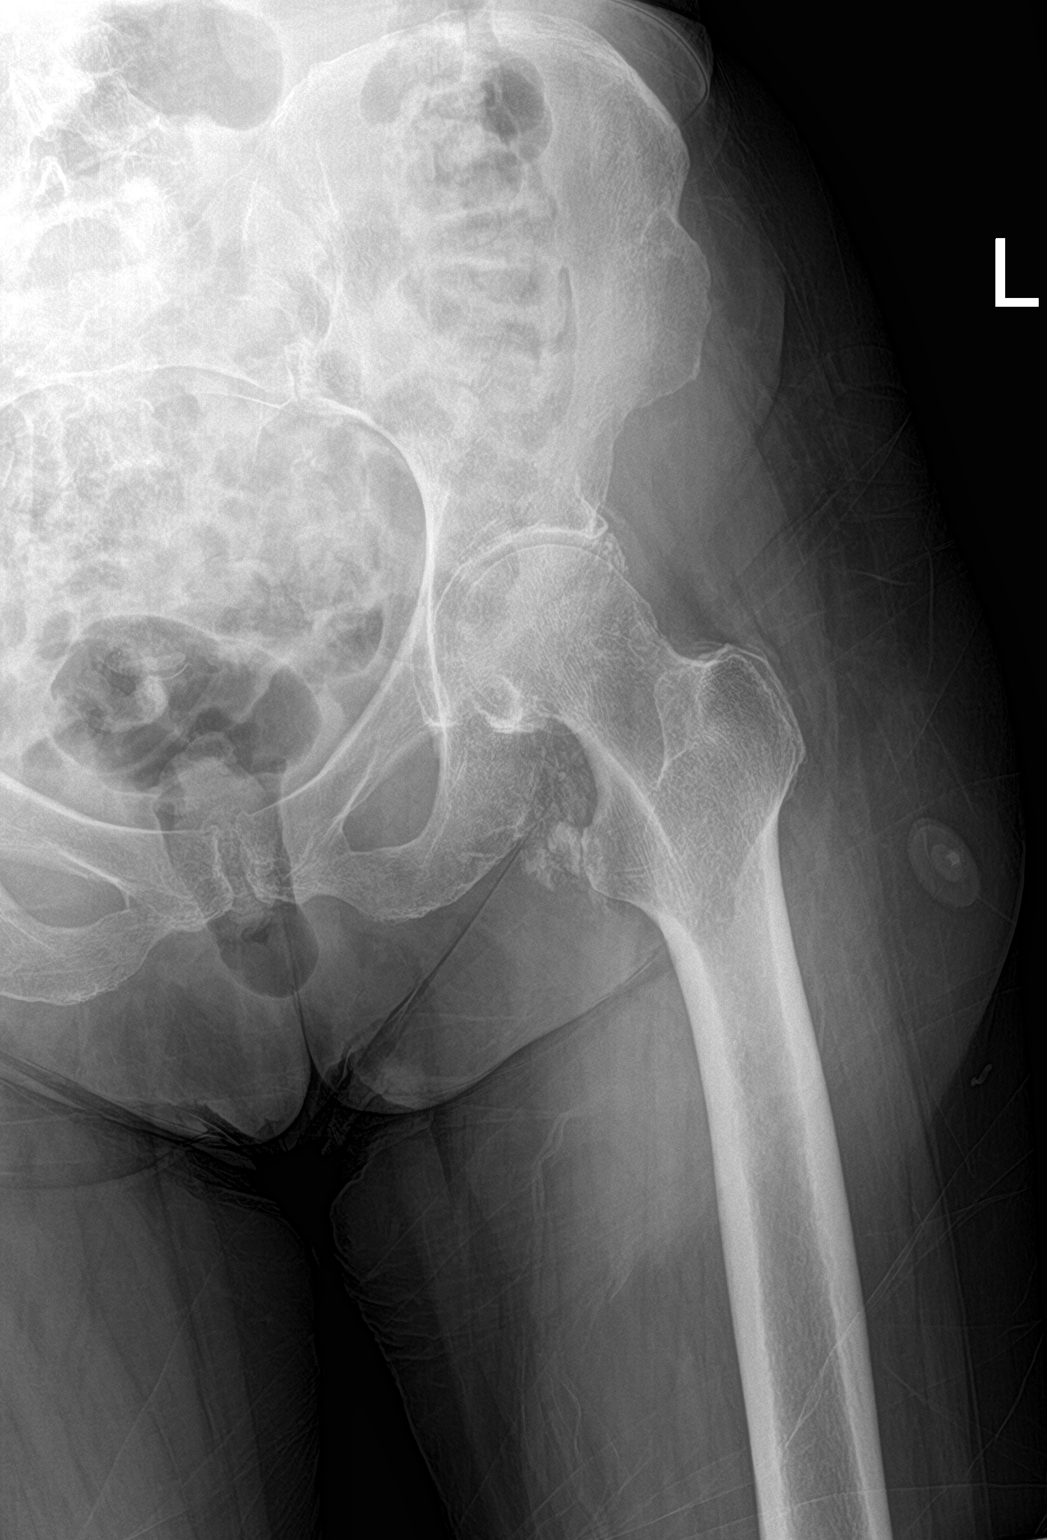

[3 of 3 positions shown; findings below may reference images not displayed]

FINDINGS: Bones are diffusely demineralized. SI joints and symphysis pubis
unremarkable. No evidence for an acute fracture in the pelvis.
Degenerative changes noted in both hips, relatively symmetric.

Frontal and frog-leg lateral views of the left hip show no femoral
neck fracture.
IMPRESSION: Degenerative changes in both hips. No acute bony findings.

## 2023-09-05 ENCOUNTER — Ambulatory Visit: Payer: Medicare Other

## 2023-09-26 ENCOUNTER — Ambulatory Visit
Admission: RE | Admit: 2023-09-26 | Discharge: 2023-09-26 | Disposition: A | Discharge status: Home / Self Care | Payer: Medicare Other | Source: Ambulatory Visit | Attending: Internal Medicine | Admitting: Internal Medicine

## 2023-09-26 DIAGNOSIS — Z1231 Encounter for screening mammogram for malignant neoplasm of breast: Secondary | ICD-10-CM

## 2023-09-27 ENCOUNTER — Ambulatory Visit: Payer: Medicare Other

## 2023-10-05 ENCOUNTER — Encounter: Payer: Self-pay | Admitting: Orthopaedic Surgery

## 2023-10-05 ENCOUNTER — Other Ambulatory Visit: Payer: Self-pay

## 2023-10-05 ENCOUNTER — Ambulatory Visit: Payer: Medicare Other | Admitting: Orthopaedic Surgery

## 2023-10-05 DIAGNOSIS — G8929 Other chronic pain: Secondary | ICD-10-CM | POA: Diagnosis not present

## 2023-10-05 DIAGNOSIS — Z96652 Presence of left artificial knee joint: Secondary | ICD-10-CM

## 2023-10-05 DIAGNOSIS — M25562 Pain in left knee: Secondary | ICD-10-CM

## 2023-10-05 DIAGNOSIS — M7052 Other bursitis of knee, left knee: Secondary | ICD-10-CM

## 2023-10-05 DIAGNOSIS — M7541 Impingement syndrome of right shoulder: Secondary | ICD-10-CM

## 2023-10-05 NOTE — Progress Notes (Signed)
The patient is well-known to [place].  She has a history of a left knee replacement that was done several years ago by one of our colleagues in town.  She had significant chronic pain after that and about a year later she was taken to the OR for lysis of adhesions and changing out her polyliner.  Earlier this year she had a pes bursa injection around her left knee and that helped her quite a bit.  She is requesting 1 today.  She is 82 years old.  She also has a history of a left shoulder rotator cuff tear.  She has been seen by Dr. Ave Filter of [facility] orth[facility].  She would like to try physical therapy here with our group.  She is seeing Dr. August Saucer in the past for her shoulder.  Her left knee move smoothly and fluidly with no effusion at all.  There is pain over the pes bursa so I did provide a steroid injection over her left pes bursa which she tolerated well.  Examination of her right shoulder does show weakness of the rotator cuff.  She is appropriate candidate for physical therapy for any modalities that can decrease her shoulder pain and improve her function as well as help with her ADLs.  We will see her back in 6 weeks after course of physical therapy on her right shoulder.

## 2023-10-06 ENCOUNTER — Ambulatory Visit: Payer: Medicare Other | Admitting: Orthopaedic Surgery

## 2023-10-18 ENCOUNTER — Other Ambulatory Visit: Payer: Self-pay

## 2023-10-18 ENCOUNTER — Encounter: Payer: Self-pay | Admitting: Physical Therapy

## 2023-10-18 ENCOUNTER — Ambulatory Visit: Payer: Medicare Other | Admitting: Physical Therapy

## 2023-10-18 DIAGNOSIS — M6281 Muscle weakness (generalized): Secondary | ICD-10-CM

## 2023-10-18 DIAGNOSIS — R600 Localized edema: Secondary | ICD-10-CM | POA: Diagnosis not present

## 2023-10-18 DIAGNOSIS — M25511 Pain in right shoulder: Secondary | ICD-10-CM

## 2023-10-18 NOTE — Therapy (Signed)
OUTPATIENT PHYSICAL THERAPY SHOULDER EVALUATION   Patient Name: Erica Frank MRN: [id number] DOB:February 07, 1941, 82 y.o., female Today's Date: 10/18/2023  END OF SESSION:  PT End of Session - 10/18/23 0855     Visit Number 1    Number of Visits 10    Date for PT Re-Evaluation 12/27/23    PT Start Time 0845    PT Stop Time 0925    PT Time Calculation (min) 40 min    Activity Tolerance Patient tolerated treatment well    Behavior During Therapy [facility] for tasks assessed/performed             Past Medical History:  Diagnosis Date   Anginal pain (HCC) 1989   Anxiety    Arthritis    Atherosclerosis    Atrial fibrillation (HCC)    Coronary artery disease    Dysrhythmia    a-fib   Elevated liver enzymes    GERD (gastroesophageal reflux disease)    Heart attack (HCC) 2020   Heart disease    High cholesterol    Hypertension    NSTEMI (non-ST elevated myocardial infarction) (HCC) 07/09/2019   Past Surgical History:  Procedure Laterality Date   ABDOMINAL HYSTERECTOMY  1983   for endometriosis with Burch   ANGIOPLASTY     ANGIOPLASTY     BACK SURGERY     1990   CARDIAC SURGERY     Catheterization   CORONARY STENT INTERVENTION N/A 07/09/2019   Procedure: CORONARY STENT INTERVENTION;  Surgeon: Yates Decamp, MD;  [facility]: [facility];  Service: Cardiovascular;  Laterality: N/A;   LEFT HEART CATH AND CORONARY ANGIOGRAPHY N/A 07/09/2019   Procedure: LEFT HEART CATH AND CORONARY ANGIOGRAPHY and possible intervention;  Surgeon: Yates Decamp, MD;  Location: [facility];  Service: Cardiovascular;  Laterality: N/A;  4: or 4:30 PM today   LUMBAR LAMINECTOMY/DECOMPRESSION MICRODISCECTOMY Left 02/02/2022   Procedure: Left Lumbar two-three, Lumbar five-Sacral one Sublaminar decompression;  Surgeon: Barnett Abu, MD;  Location: [facility] OR;  Service: Neurosurgery;  Laterality: Left;   NECK SURGERY     TOTAL KNEE ARTHROPLASTY Left 10/07/2020   Procedure: TOTAL KNEE ARTHROPLASTY;   Surgeon: Durene Romans, MD;  Location: [facility] OR[facility];  Service: Orthopedics;  Laterality: Left;  70 mins   TOTAL KNEE REVISION Left 05/13/2022   Procedure: SYNOVECTOMY AND LEFT KNEE POLY-LINER EXCHANGE;  Surgeon: Kathryne Hitch, MD;  Location: [facility] OR;  Service: Orthopedics;  Laterality: Left;   Patient Active Problem List   Diagnosis Date Noted   Status post revision of total replacement of left knee 05/13/2022   Degenerative lumbar spinal stenosis 02/02/2022   S/P left TKA 10/07/2020   Left knee OA 10/07/2020   Syncope 01/16/2020   Anemia 01/16/2020   Thrombocytopenia (HCC) 01/16/2020   Paroxysmal atrial fibrillation (HCC) 02/15/2019   Mixed hyperlipidemia 02/15/2019   Bradycardia 02/15/2019   Coronary artery disease involving native coronary artery of native heart with unstable angina pectoris (HCC) 12/20/2018   Dyslipidemia 12/20/2018   Essential hypertension 12/20/2018    PCP: Chilton Greathouse, MD   REFERRING PROVIDER: Kathryne Hitch*   REFERRING DIAG:  Diagnosis  M75.41 (ICD-10-CM) - Impingement syndrome of right shoulder    THERAPY DIAG:  Acute pain of right shoulder  Muscle weakness (generalized)  Localized edema  Rationale for Evaluation and Treatment: Rehabilitation  ONSET DATE: 6 months ago  SUBJECTIVE:  SUBJECTIVE STATEMENT: Pt arriving reporting Rt shoulder pain with reaching and throwing of 6-8/10. Pt reporting no pain at rest. Pt stating her pain has been on-going for the last 6 months.   PERTINENT HISTORY: 05/13/2022, TKA revision, back surgery for ruptured disc 3 months ago per patient report, Lt TKA revision 05/13/22 HTN, MI, A-fib, CAD, L lumbar laminectomy, atherosclerosis, angina, HTN  PAIN:  NPRS scale: 6-8/10 Pain location: Rt shoulder Pain description: achy,  sharp Aggravating factors: reaching, throwing, lifting up in cabinets Relieving factors: resting, tylenol  PRECAUTIONS: None  WEIGHT BEARING RESTRICTIONS: No  FALLS:  Has patient fallen in last 6 months? No  LIVING ENVIRONMENT: Lives with: lives with their family, daughter lives with pt Lives in: House/apartment Stairs: No, bedroom on main floor, ramp to enter Has following equipment at home: Environmental consultant - 2 wheeled, st cane  OCCUPATION: retired  PLOF: Independent  PATIENT GOALS:Be able to reach into cabinet without pain and throw if needed using Rt arm  Next MD visit:   OBJECTIVE:   DIAGNOSTIC FINDINGS:  2023:  AP, scapular Y, axillary views of right shoulder reviewed.  No significant  degenerative changes of the right glenohumeral joint.  Mild degenerative  changes of the [facility] joint.  No loss of acromiohumeral interval.  No fracture  or dislocation.   PATIENT SURVEYS:  10/18/23: FOTO intake:     55%  COGNITION: Overall cognitive status: WFL     SENSATION: WFL  POSTURE: Forward shoulders, rounded head  UPPER EXTREMITY ROM:   ROM Right 10/18/23 Left 10/18/23  Shoulder flexion 140 148  Shoulder extension 65 65  Shoulder abduction 140 145  Shoulder adduction    Shoulder internal rotation 60  shoulder abd 45  60 Shoulder abd 45 deg  Shoulder external rotation 75   Elbow flexion    Elbow extension    (Blank rows = not tested)  UPPER EXTREMITY MMT:  MMT Right 10/18/23 Left 10/18/23  Shoulder flexion 3 5  Shoulder extension 4 5  Shoulder abduction 3 5  Shoulder adduction    Shoulder internal rotation 5 5  Shoulder external rotation 5 5  Middle trapezius    Lower trapezius    Elbow flexion    Elbow extension    Grip strength (lbs)    (Blank rows = not tested)  SHOULDER SPECIAL TESTS: Impingement tests: Painful arc test: positive    JOINT MOBILITY TESTING:  10/18/23: limited mobility in Rt GH joint inferior glides  PALPATION:  TTP: Rt  anterior and lateral shoulder  TODAY'S TREATMENT:                                                                                                       DATE: 10/18/23:  Therex:    HEP instruction/performance c cues for techniques, handout provided.  Trial set performed of each for comprehension and symptom assessment.  See below for exercise list   PATIENT EDUCATION: Education details: HEP, POC Person educated: Patient Education method: Explanation, Demonstration, Verbal cues, and Handouts Education comprehension: verbalized understanding, returned demonstration, and verbal cues required  HOME EXERCISE PROGRAM: Access Code: [email or web address] URL: [email or web address] Date: 10/18/2023 Prepared by: Narda Amber  Exercises - Supine Shoulder Flexion Extension AAROM with Dowel  - 2 x daily - 7 x weekly - 2 sets - 10 reps - Shoulder External Rotation and Scapular Retraction with Resistance  - 2 x daily - 7 x weekly - 2 sets - 10 reps - 3 seconds hold - Standing Row with Anchored Resistance  - 2 x daily - 7 x weekly - 2 sets - 10 reps - 3 seconds hold  ASSESSMENT:  CLINICAL IMPRESSION: Patient is a 82 y.o. who comes to clinic with complaints of Rt shoulder pain with mobility, strength and movement coordination deficits that impair their ability to perform usual daily and recreational functional activities without increase difficulty/symptoms at this time.  Patient to benefit from skilled PT services to address impairments and limitations to improve to previous level of function without restriction secondary to condition.   OBJECTIVE IMPAIRMENTS: decreased ROM, decreased strength, increased edema, impaired UE functional use, postural dysfunction, and pain.   ACTIVITY LIMITATIONS: lifting,  toileting, dressing, reach over head, and hygiene/grooming  PARTICIPATION LIMITATIONS: cleaning, laundry, and community activity  PERSONAL FACTORS: 3+ comorbidities: see pertinent history above  are also affecting patient's functional outcome.   REHAB POTENTIAL: Good  CLINICAL DECISION MAKING: Stable/uncomplicated  EVALUATION COMPLEXITY: Low   GOALS: Goals reviewed with patient? Yes  SHORT TERM GOALS: (target date for Short term goals are 3 weeks 11/08/2023)  1.Patient will demonstrate independent use of home exercise program to maintain progress from in clinic treatments. Goal status: New  LONG TERM GOALS: (target dates for all long term goals are 10 weeks  12/27/2023 )   1. Patient will demonstrate/report pain at worst less than or equal to 2/10 to facilitate minimal limitation in daily activity secondary to pain symptoms. Goal status: New   2. Patient will demonstrate independent use of home exercise program to facilitate ability to maintain/progress functional gains from skilled physical therapy services. Goal status: New   3. Patient will demonstrate FOTO outcome > or = 64 % to indicate reduced disability due to condition. Goal status: New   4.  Patient will demonstrate Rt UE MMT >/= /5 throughout to facilitate lifting, reaching, carrying at [facility] in daily activity.   Goal status: New   5.  Patient will demonstrate Rt GH joint AROM WFL s symptoms to facilitate usual overhead reaching, self care, dressing at PLOF.    Goal status: New   6.  pt will be  able to lift 3# object onto over head shelf with her Rt UE with pain </= 2#.  Goal status: New   PLAN:  PT FREQUENCY: 1-2x/week  PT DURATION: 10 weeks  PLANNED INTERVENTIONS: Can include 16109- PT Re-evaluation, 97110-Therapeutic exercises, 97530- Therapeutic activity, [id number]- Neuromuscular re-education, 97535- Self Care, 97140- Manual therapy, [id number]- Gait training, [phone]- Orthotic Fit/training, [phone]- Canalith  repositioning, [id number]- Aquatic Therapy, 97014- Electrical stimulation (unattended), [id number]- Electrical stimulation (manual), [id number]- Vasopneumatic device, [id number]- Ultrasound, [id number]- Traction (mechanical), [id number]- Ionotophoresis 4mg /ml Dexamethasone, Patient/Family education, Balance training, Stair training, Taping, Dry Needling, Joint mobilization, Joint manipulation, Spinal manipulation, Spinal mobilization, Scar mobilization, Vestibular training, Visual/preceptual remediation/compensation, DME instructions, Cryotherapy, and Moist heat.  All performed as medically necessary.  All included unless contraindicated  PLAN FOR NEXT SESSION: Review HEP knowledge/results, ROM, gentle strengthening    Sharmon Leyden, PT, MPT 10/18/2023, 12:42 PM

## 2023-10-20 ENCOUNTER — Telehealth: Payer: Self-pay | Admitting: Cardiology

## 2023-10-20 DIAGNOSIS — E7800 Pure hypercholesterolemia, unspecified: Secondary | ICD-10-CM

## 2023-10-20 DIAGNOSIS — I25118 Atherosclerotic heart disease of native coronary artery with other forms of angina pectoris: Secondary | ICD-10-CM

## 2023-10-20 MED ORDER — PRALUENT 75 MG/ML ~~LOC~~ SOAJ: 75.0000 mg | SUBCUTANEOUS | 0 refills | Status: DC

## 2023-10-20 NOTE — Telephone Encounter (Signed)
Patient calling the office for samples of medication:   1.  What medication and dosage are you requesting samples for?  apixaban (ELIQUIS) 5 MG TABS tablet    2.  Are you currently out of this medication? NO

## 2023-10-20 NOTE — Telephone Encounter (Signed)
*  STAT* If patient is at the pharmacy, call can be transferred to refill team.   1. Which medications need to be refilled? (please list name of each medication and dose if known)   Alirocumab (PRALUENT) 75 MG/ML SOAJ    2. Which pharmacy/location (including street and city if local pharmacy) is medication to be sent to?   Alirocumab (PRALUENT) 75 MG/ML SOAJ   3. Do they need a 30 day or 90 day supply? 6 week supply per pt

## 2023-10-21 MED ORDER — PRALUENT 75 MG/ML ~~LOC~~ SOAJ: 75.0000 mg | SUBCUTANEOUS | 3 refills | Status: DC

## 2023-10-21 MED ORDER — APIXABAN 2.5 MG PO TABS: 2.5000 mg | ORAL_TABLET | Freq: Two times a day (BID) | ORAL | 5 refills | Status: DC

## 2023-10-21 MED ORDER — APIXABAN 2.5 MG PO TABS: 2.5000 mg | ORAL_TABLET | Freq: Two times a day (BID) | ORAL | Status: DC

## 2023-10-21 NOTE — Addendum Note (Signed)
Addended by: Margaret Pyle D on: 10/21/2023 04:09 PM   Modules accepted: Orders

## 2023-10-21 NOTE — Telephone Encounter (Signed)
Agree with dosing change. Thank you

## 2023-10-21 NOTE — Addendum Note (Signed)
Addended by: Malena Peer D on: 10/21/2023 01:30 PM   Modules accepted: Orders

## 2023-10-21 NOTE — Telephone Encounter (Signed)
Leaving pt 2 boxes of Eliquis 2.5 mg tablets at the front desk [facility] for pt to pick up. FYI

## 2023-10-21 NOTE — Telephone Encounter (Signed)
Spoke with patient. Advised about dose change. Rx sent to pharmacy. We can leave 2 weeks of Eliquis 2.5mg  for patient.

## 2023-10-21 NOTE — Telephone Encounter (Signed)
Doesn't look like patient has filled since July for a 90 day supply. We are not able to sample long term. We can give 2 weeks so she only needs to fill a 1 month supply.  Her dose also should be reduced based on her age and weight. Weight it borderline and scr is 0.8. age 82. I will check with Dr. Jacinto Halim about reducing.

## 2023-10-26 ENCOUNTER — Encounter: Payer: Medicare Other | Admitting: Physical Therapy

## 2023-10-26 ENCOUNTER — Telehealth: Payer: Self-pay | Admitting: Physical Therapy

## 2023-10-26 NOTE — Therapy (Deleted)
OUTPATIENT PHYSICAL THERAPY SHOULDER EVALUATION   Patient Name: Erica Frank MRN: [id number] DOB:07-01-1941, 82 y.o., female Today's Date: 10/26/2023  END OF SESSION:    Past Medical History:  Diagnosis Date   Anginal pain (HCC) 1989   Anxiety    Arthritis    Atherosclerosis    Atrial fibrillation (HCC)    Coronary artery disease    Dysrhythmia    a-fib   Elevated liver enzymes    GERD (gastroesophageal reflux disease)    Heart attack (HCC) 2020   Heart disease    High cholesterol    Hypertension    NSTEMI (non-ST elevated myocardial infarction) (HCC) 07/09/2019   Past Surgical History:  Procedure Laterality Date   ABDOMINAL HYSTERECTOMY  1983   for endometriosis with Burch   ANGIOPLASTY     ANGIOPLASTY     BACK SURGERY     1990   CARDIAC SURGERY     Catheterization   CORONARY STENT INTERVENTION N/A 07/09/2019   Procedure: CORONARY STENT INTERVENTION;  Surgeon: Yates Decamp, MD;  [facility]: [facility];  Service: Cardiovascular;  Laterality: N/A;   LEFT HEART CATH AND CORONARY ANGIOGRAPHY N/A 07/09/2019   Procedure: LEFT HEART CATH AND CORONARY ANGIOGRAPHY and possible intervention;  Surgeon: Yates Decamp, MD;  Location: [facility];  Service: Cardiovascular;  Laterality: N/A;  4: or 4:30 PM today   LUMBAR LAMINECTOMY/DECOMPRESSION MICRODISCECTOMY Left 02/02/2022   Procedure: Left Lumbar two-three, Lumbar five-Sacral one Sublaminar decompression;  Surgeon: Barnett Abu, MD;  Location: [facility] OR;  Service: Neurosurgery;  Laterality: Left;   NECK SURGERY     TOTAL KNEE ARTHROPLASTY Left 10/07/2020   Procedure: TOTAL KNEE ARTHROPLASTY;  Surgeon: Durene Romans, MD;  Location: [facility];  Service: Orthopedics;  Laterality: Left;  70 mins   TOTAL KNEE REVISION Left 05/13/2022   Procedure: SYNOVECTOMY AND LEFT KNEE POLY-LINER EXCHANGE;  Surgeon: Kathryne Hitch, MD;  Location: [facility] OR;  Service: Orthopedics;  Laterality: Left;   Patient Active Problem List    Diagnosis Date Noted   Status post revision of total replacement of left knee 05/13/2022   Degenerative lumbar spinal stenosis 02/02/2022   S/P left TKA 10/07/2020   Left knee OA 10/07/2020   Syncope 01/16/2020   Anemia 01/16/2020   Thrombocytopenia (HCC) 01/16/2020   Paroxysmal atrial fibrillation (HCC) 02/15/2019   Mixed hyperlipidemia 02/15/2019   Bradycardia 02/15/2019   Coronary artery disease involving native coronary artery of native heart with unstable angina pectoris (HCC) 12/20/2018   Dyslipidemia 12/20/2018   Essential hypertension 12/20/2018    PCP: Chilton Greathouse, MD   REFERRING PROVIDER: Kathryne Hitch*   REFERRING DIAG:  Diagnosis  M75.41 (ICD-10-CM) - Impingement syndrome of right shoulder    THERAPY DIAG:  No diagnosis found.  Rationale for Evaluation and Treatment: Rehabilitation  ONSET DATE: 6 months ago  SUBJECTIVE:  SUBJECTIVE STATEMENT: Pt arriving reporting Rt shoulder pain with reaching and throwing of 6-8/10. Pt reporting no pain at rest. Pt stating her pain has been on-going for the last 6 months.   PERTINENT HISTORY: 05/13/2022, TKA revision, back surgery for ruptured disc 3 months ago per patient report, Lt TKA revision 05/13/22 HTN, MI, A-fib, CAD, L lumbar laminectomy, atherosclerosis, angina, HTN  PAIN:  NPRS scale: 6-8/10 Pain location: Rt shoulder Pain description: achy, sharp Aggravating factors: reaching, throwing, lifting up in cabinets Relieving factors: resting, tylenol  PRECAUTIONS: None  WEIGHT BEARING RESTRICTIONS: No  FALLS:  Has patient fallen in last 6 months? No  LIVING ENVIRONMENT: Lives with: lives with their family, daughter lives with pt Lives in: House/apartment Stairs: No, bedroom on main floor, ramp to enter Has following  equipment at home: Environmental consultant - 2 wheeled, st cane  OCCUPATION: retired  PLOF: Independent  PATIENT GOALS:Be able to reach into cabinet without pain and throw if needed using Rt arm  Next MD visit:   OBJECTIVE:   DIAGNOSTIC FINDINGS:  2023:  AP, scapular Y, axillary views of right shoulder reviewed.  No significant  degenerative changes of the right glenohumeral joint.  Mild degenerative  changes of the [facility] joint.  No loss of acromiohumeral interval.  No fracture  or dislocation.   PATIENT SURVEYS:  10/18/23: FOTO intake:     55%  COGNITION: Overall cognitive status: WFL     SENSATION: WFL  POSTURE: Forward shoulders, rounded head  UPPER EXTREMITY ROM:   ROM Right 10/18/23 Left 10/18/23  Shoulder flexion 140 148  Shoulder extension 65 65  Shoulder abduction 140 145  Shoulder adduction    Shoulder internal rotation 60  shoulder abd 45  60 Shoulder abd 45 deg  Shoulder external rotation 75   Elbow flexion    Elbow extension    (Blank rows = not tested)  UPPER EXTREMITY MMT:  MMT Right 10/18/23 Left 10/18/23  Shoulder flexion 3 5  Shoulder extension 4 5  Shoulder abduction 3 5  Shoulder adduction    Shoulder internal rotation 5 5  Shoulder external rotation 5 5  Middle trapezius    Lower trapezius    Elbow flexion    Elbow extension    Grip strength (lbs)    (Blank rows = not tested)  SHOULDER SPECIAL TESTS: Impingement tests: Painful arc test: positive    JOINT MOBILITY TESTING:  10/18/23: limited mobility in Rt GH joint inferior glides  PALPATION:  TTP: Rt anterior and lateral shoulder  TODAY'S TREATMENT:                                                                                                       DATE: 10/18/23:  Therex:    HEP  instruction/performance c cues for techniques, handout provided.  Trial set performed of each for comprehension and symptom assessment.  See below for exercise list   PATIENT EDUCATION: Education details: HEP, POC Person educated: Patient Education method: Explanation, Demonstration, Verbal cues, and Handouts Education comprehension: verbalized understanding, returned demonstration, and verbal cues required  HOME EXERCISE PROGRAM: Access Code: [email or web address] URL: [email or web address] Date: 10/18/2023 Prepared by: Narda Amber  Exercises - Supine Shoulder Flexion Extension AAROM with Dowel  - 2 x daily - 7 x weekly - 2 sets - 10 reps - Shoulder External Rotation and Scapular Retraction with Resistance  - 2 x daily - 7 x weekly - 2 sets - 10 reps - 3 seconds hold - Standing Row with Anchored Resistance  - 2 x daily - 7 x weekly - 2 sets - 10 reps - 3 seconds hold  ASSESSMENT:  CLINICAL IMPRESSION: Patient is a 82 y.o. who comes to clinic with complaints of Rt shoulder pain with mobility, strength and movement coordination deficits that impair their ability to perform usual daily and recreational functional activities without increase difficulty/symptoms at this time.  Patient to benefit from skilled PT services to address impairments and limitations to improve to previous level of function without restriction secondary to condition.   OBJECTIVE IMPAIRMENTS: decreased ROM, decreased strength, increased edema, impaired UE functional use, postural dysfunction, and pain.   ACTIVITY LIMITATIONS: lifting, toileting, dressing, reach over head, and hygiene/grooming  PARTICIPATION LIMITATIONS: cleaning, laundry, and community activity  PERSONAL FACTORS: 3+ comorbidities: see pertinent history above  are also affecting patient's functional outcome.   REHAB POTENTIAL: Good  CLINICAL DECISION MAKING: Stable/uncomplicated  EVALUATION COMPLEXITY: Low   GOALS: Goals reviewed with  patient? Yes  SHORT TERM GOALS: (target date for Short term goals are 3 weeks 11/08/2023)  1.Patient will demonstrate independent use of home exercise program to maintain progress from in clinic treatments. Goal status: New  LONG TERM GOALS: (target dates for all long term goals are 10 weeks  12/27/2023 )   1. Patient will demonstrate/report pain at worst less than or equal to 2/10 to facilitate minimal limitation in daily activity secondary to pain symptoms. Goal status: New   2. Patient will demonstrate independent use of home exercise program to facilitate ability to maintain/progress functional gains from skilled physical therapy services. Goal status: New   3. Patient will demonstrate FOTO outcome > or = 64 % to indicate reduced disability due to condition. Goal status: New   4.  Patient will demonstrate Rt UE MMT >/= /5 throughout to facilitate lifting, reaching, carrying at [facility] in daily activity.   Goal status: New   5.  Patient will demonstrate Rt GH joint AROM WFL s symptoms to facilitate usual overhead reaching, self care, dressing at PLOF.    Goal status: New   6.  pt will  be able to lift 3# object onto over head shelf with her Rt UE with pain </= 2#.  Goal status: New   PLAN:  PT FREQUENCY: 1-2x/week  PT DURATION: 10 weeks  PLANNED INTERVENTIONS: Can include 06301- PT Re-evaluation, 97110-Therapeutic exercises, 97530- Therapeutic activity, [id number]- Neuromuscular re-education, 97535- Self Care, 97140- Manual therapy, [id number]- Gait training, [phone]- Orthotic Fit/training, [phone]- Canalith repositioning, [id number]- Aquatic Therapy, 97014- Electrical stimulation (unattended), [id number]- Electrical stimulation (manual), [id number]- Vasopneumatic device, [id number]- Ultrasound, [id number]- Traction (mechanical), [id number]- Ionotophoresis 4mg /ml Dexamethasone, Patient/Family education, Balance training, Stair training, Taping, Dry Needling, Joint mobilization, Joint manipulation, Spinal manipulation, Spinal  mobilization, Scar mobilization, Vestibular training, Visual/preceptual remediation/compensation, DME instructions, Cryotherapy, and Moist heat.  All performed as medically necessary.  All included unless contraindicated  PLAN FOR NEXT SESSION: Review HEP knowledge/results, ROM, gentle strengthening    Sharmon Leyden, PT, MPT 10/26/2023, 8:00 AM

## 2023-10-26 NOTE — Telephone Encounter (Signed)
I called pt after she didn't show for her PT appointment on 10/26/23 at 8:00am. I left a message reminding pt of her next appointment which is scheduled on 10/31/23 at 3:15. I also left our front office number of [phone] if pt wishes to reschedule her missed visit.   Narda Amber, PT, MPT 10/26/23 10:26 AM

## 2023-10-31 ENCOUNTER — Ambulatory Visit: Payer: Medicare Other | Admitting: Physical Therapy

## 2023-10-31 ENCOUNTER — Encounter: Payer: Self-pay | Admitting: Physical Therapy

## 2023-10-31 DIAGNOSIS — M25511 Pain in right shoulder: Secondary | ICD-10-CM | POA: Diagnosis not present

## 2023-10-31 DIAGNOSIS — R600 Localized edema: Secondary | ICD-10-CM

## 2023-10-31 DIAGNOSIS — M6281 Muscle weakness (generalized): Secondary | ICD-10-CM

## 2023-10-31 NOTE — Therapy (Addendum)
OUTPATIENT PHYSICAL THERAPY SHOULDER    Patient Name: Erica Frank MRN: [id number] DOB:08-Feb-1941, 82 y.o., female Today's Date: 10/31/2023  END OF SESSION:  PT End of Session - 10/31/23 1515     Visit Number 2    Number of Visits 10    Date for PT Re-Evaluation 12/27/23    PT Start Time 1430    PT Stop Time 1510    PT Time Calculation (min) 40 min    Activity Tolerance Patient tolerated treatment well    Behavior During Therapy [facility] for tasks assessed/performed              Past Medical History:  Diagnosis Date   Anginal pain (HCC) 1989   Anxiety    Arthritis    Atherosclerosis    Atrial fibrillation (HCC)    Coronary artery disease    Dysrhythmia    a-fib   Elevated liver enzymes    GERD (gastroesophageal reflux disease)    Heart attack (HCC) 2020   Heart disease    High cholesterol    Hypertension    NSTEMI (non-ST elevated myocardial infarction) (HCC) 07/09/2019   Past Surgical History:  Procedure Laterality Date   ABDOMINAL HYSTERECTOMY  1983   for endometriosis with Burch   ANGIOPLASTY     ANGIOPLASTY     BACK SURGERY     1990   CARDIAC SURGERY     Catheterization   CORONARY STENT INTERVENTION N/A 07/09/2019   Procedure: CORONARY STENT INTERVENTION;  Surgeon: Yates Decamp, MD;  [facility];  Service: Cardiovascular;  Laterality: N/A;   LEFT HEART CATH AND CORONARY ANGIOGRAPHY N/A 07/09/2019   Procedure: LEFT HEART CATH AND CORONARY ANGIOGRAPHY and possible intervention;  Surgeon: Yates Decamp, MD;  Location: [facility];  Service: Cardiovascular;  Laterality: N/A;  4: or 4:30 PM today   LUMBAR LAMINECTOMY/DECOMPRESSION MICRODISCECTOMY Left 02/02/2022   Procedure: Left Lumbar two-three, Lumbar five-Sacral one Sublaminar decompression;  Surgeon: Barnett Abu, MD;  Location: [facility] OR;  Service: Neurosurgery;  Laterality: Left;   NECK SURGERY     TOTAL KNEE ARTHROPLASTY Left 10/07/2020   Procedure: TOTAL KNEE ARTHROPLASTY;   Surgeon: Durene Romans, MD;  Location: [facility];  Service: Orthopedics;  Laterality: Left;  70 mins   TOTAL KNEE REVISION Left 05/13/2022   Procedure: SYNOVECTOMY AND LEFT KNEE POLY-LINER EXCHANGE;  Surgeon: Kathryne Hitch, MD;  Location: [facility] OR;  Service: Orthopedics;  Laterality: Left;   Patient Active Problem List   Diagnosis Date Noted   Status post revision of total replacement of left knee 05/13/2022   Degenerative lumbar spinal stenosis 02/02/2022   S/P left TKA 10/07/2020   Left knee OA 10/07/2020   Syncope 01/16/2020   Anemia 01/16/2020   Thrombocytopenia (HCC) 01/16/2020   Paroxysmal atrial fibrillation (HCC) 02/15/2019   Mixed hyperlipidemia 02/15/2019   Bradycardia 02/15/2019   Coronary artery disease involving native coronary artery of native heart with unstable angina pectoris (HCC) 12/20/2018   Dyslipidemia 12/20/2018   Essential hypertension 12/20/2018    PCP: Chilton Greathouse, MD   REFERRING PROVIDER: Kathryne Hitch*   REFERRING DIAG:  Diagnosis  M75.41 (ICD-10-CM) - Impingement syndrome of right shoulder    THERAPY DIAG:  Acute pain of right shoulder  Muscle weakness (generalized)  Localized edema  Rationale for Evaluation and Treatment: Rehabilitation  ONSET DATE: 6 months ago  SUBJECTIVE:  SUBJECTIVE STATEMENT: Pt arriving today reporting no pain at rest. Pt did however report pain with reaching to shoulder height.    PERTINENT HISTORY: 05/13/2022, TKA revision, back surgery for ruptured disc 3 months ago per patient report, Lt TKA revision 05/13/22 HTN, MI, A-fib, CAD, L lumbar laminectomy, atherosclerosis, angina, HTN  PAIN:  NPRS scale: 4/10 Pain location: Rt shoulder Pain description: achy, sharp Aggravating factors: reaching, throwing, lifting up in  cabinets Relieving factors: resting, tylenol  PRECAUTIONS: None  WEIGHT BEARING RESTRICTIONS: No  FALLS:  Has patient fallen in last 6 months? No  LIVING ENVIRONMENT: Lives with: lives with their family, daughter lives with pt Lives in: House/apartment Stairs: No, bedroom on main floor, ramp to enter Has following equipment at home: Environmental consultant - 2 wheeled, st cane  OCCUPATION: retired  PLOF: Independent  PATIENT GOALS:Be able to reach into cabinet without pain and throw if needed using Rt arm  Next MD visit:   OBJECTIVE:   DIAGNOSTIC FINDINGS:  2023:  AP, scapular Y, axillary views of right shoulder reviewed.  No significant  degenerative changes of the right glenohumeral joint.  Mild degenerative  changes of the [facility] joint.  No loss of acromiohumeral interval.  No fracture  or dislocation.   PATIENT SURVEYS:  10/18/23: FOTO intake:     55%  COGNITION: Overall cognitive status: WFL     SENSATION: WFL  POSTURE: Forward shoulders, rounded head  UPPER EXTREMITY ROM:   ROM Right 10/18/23 Left 10/18/23  Shoulder flexion 140 148  Shoulder extension 65 65  Shoulder abduction 140 145  Shoulder adduction    Shoulder internal rotation 60  shoulder abd 45  60 Shoulder abd 45 deg  Shoulder external rotation 75   Elbow flexion    Elbow extension    (Blank rows = not tested)  UPPER EXTREMITY MMT:  MMT Right 10/18/23 Left 10/18/23  Shoulder flexion 3 5  Shoulder extension 4 5  Shoulder abduction 3 5  Shoulder adduction    Shoulder internal rotation 5 5  Shoulder external rotation 5 5  Middle trapezius    Lower trapezius    Elbow flexion    Elbow extension    Grip strength (lbs)    (Blank rows = not tested)  SHOULDER SPECIAL TESTS: Impingement tests: Painful arc test: positive    JOINT MOBILITY TESTING:  10/18/23: limited mobility in Rt GH joint inferior glides  PALPATION:  TTP: Rt anterior and lateral shoulder  TODAY'S TREATMENT:                                                                                                       DATE: 10/31/23:  Therex: Pulleys: x 3 minutes scaption  Row: green TB 2 x 15 holding 3 sec ER red TB 2 x 15  Scapular stabilization: shoulder 80-90 deg circles both direction x 15  Isometrics: flexion, extension, ER, and IR x 10 holding 5 sec Rolling ball up and down wall x 10 holding end range x 3 sec Standing AAROM c 1# bar  2 x 10  Standing Rt shoulder abd c 1 # bar 2 x 10  Lifting 1# weight onto 1st clinic gym shelf x 10        10/18/23:  Therex:    HEP instruction/performance c cues for techniques, handout provided.  Trial set performed of each for comprehension and symptom assessment.  See below for exercise list   PATIENT EDUCATION: Education details: HEP, POC Person educated: Patient Education method: Explanation, Demonstration, Verbal cues, and Handouts Education comprehension: verbalized understanding, returned demonstration, and verbal cues required  HOME EXERCISE PROGRAM: Access Code: [email or web address] URL: [email or web address] Date: 10/18/2023 Prepared by: Narda Amber  Exercises - Supine Shoulder Flexion Extension AAROM with Dowel  - 2 x daily - 7 x weekly - 2 sets - 10 reps - Shoulder External Rotation and Scapular Retraction with Resistance  - 2 x daily - 7 x weekly - 2 sets - 10 reps - 3 seconds hold - Standing Row with Anchored Resistance  - 2 x daily - 7 x weekly - 2 sets - 10 reps - 3 seconds hold  ASSESSMENT:  CLINICAL IMPRESSION: Pt arriving today reporting no pain at rest and 4/10 pain with reaching activities. Pt tolerating all exercises well today. Continue skilled PT interventions as pt tolerates.   OBJECTIVE IMPAIRMENTS: decreased ROM, decreased strength,  increased edema, impaired UE functional use, postural dysfunction, and pain.   ACTIVITY LIMITATIONS: lifting, toileting, dressing, reach over head, and hygiene/grooming  PARTICIPATION LIMITATIONS: cleaning, laundry, and community activity  PERSONAL FACTORS: 3+ comorbidities: see pertinent history above  are also affecting patient's functional outcome.   REHAB POTENTIAL: Good  CLINICAL DECISION MAKING: Stable/uncomplicated  EVALUATION COMPLEXITY: Low   GOALS: Goals reviewed with patient? Yes  SHORT TERM GOALS: (target date for Short term goals are 3 weeks 11/08/2023)  1.Patient will demonstrate independent use of home exercise program to maintain progress from in clinic treatments. Goal status: on-going 10/29/24  LONG TERM GOALS: (target dates for all long term goals are 10 weeks  12/27/2023 )   1. Patient will demonstrate/report pain at worst less than or equal to 2/10 to facilitate minimal limitation in daily activity secondary to pain symptoms. Goal status: New   2. Patient will demonstrate independent use of home exercise program to facilitate ability to maintain/progress functional gains from skilled physical therapy services. Goal status: New   3. Patient will demonstrate FOTO outcome > or = 64 % to indicate reduced disability due to condition. Goal status: New  4.  Patient will demonstrate Rt UE MMT >/= /5 throughout to facilitate lifting, reaching, carrying at [facility] in daily activity.   Goal status: New   5.  Patient will demonstrate Rt GH joint AROM WFL s symptoms to facilitate usual overhead reaching, self care, dressing at PLOF.    Goal status: New   6.  pt will be able to lift 3# object onto over head shelf with her Rt UE with pain </= 2#.  Goal status: New   PLAN:  PT FREQUENCY: 1-2x/week  PT DURATION: 10 weeks  PLANNED INTERVENTIONS: Can include 18841- PT Re-evaluation, 97110-Therapeutic exercises, 97530- Therapeutic activity, [id number]- Neuromuscular  re-education, 97535- Self Care, 97140- Manual therapy, [id number]- Gait training, [phone]- Orthotic Fit/training, [phone]- Canalith repositioning, [id number]- Aquatic Therapy, 97014- Electrical stimulation (unattended), [id number]- Electrical stimulation (manual), [id number]- Vasopneumatic device, [id number]- Ultrasound, [id number]- Traction (mechanical), [id number]- Ionotophoresis 4mg /ml Dexamethasone, Patient/Family education, Balance training, Stair training, Taping, Dry Needling, Joint mobilization, Joint manipulation, Spinal manipulation, Spinal mobilization, Scar mobilization, Vestibular training, Visual/preceptual remediation/compensation, DME instructions, Cryotherapy, and Moist heat.  All performed as medically necessary.  All included unless contraindicated  PLAN FOR NEXT SESSION:  Shoulder ROM, gentle strengthening     Sharmon Leyden, PT, MPT 10/31/2023, 3:18 PM

## 2023-11-07 ENCOUNTER — Ambulatory Visit: Payer: Medicare Other | Admitting: Physical Therapy

## 2023-11-07 ENCOUNTER — Encounter: Payer: Self-pay | Admitting: Physical Therapy

## 2023-11-07 DIAGNOSIS — R600 Localized edema: Secondary | ICD-10-CM

## 2023-11-07 DIAGNOSIS — M6281 Muscle weakness (generalized): Secondary | ICD-10-CM

## 2023-11-07 DIAGNOSIS — M25511 Pain in right shoulder: Secondary | ICD-10-CM | POA: Diagnosis not present

## 2023-11-07 NOTE — Therapy (Signed)
OUTPATIENT PHYSICAL THERAPY SHOULDER  DISCHARGE   Patient Name: Erica Frank MRN: [id number] DOB:[birth year], 82 y.o., female Today's Date: 11/07/2023  END OF SESSION:  PT End of Session - 11/07/23 1513     Visit Number 3    Number of Visits 10    Date for PT Re-Evaluation 12/27/23    PT Start Time 1513    PT Stop Time 1555    PT Time Calculation (min) 42 min    Behavior During Therapy [facility] for tasks assessed/performed              Past Medical History:  Diagnosis Date   Anginal pain (HCC) 1989   Anxiety    Arthritis    Atherosclerosis    Atrial fibrillation (HCC)    Coronary artery disease    Dysrhythmia    a-fib   Elevated liver enzymes    GERD (gastroesophageal reflux disease)    Heart attack (HCC) 2020   Heart disease    High cholesterol    Hypertension    NSTEMI (non-ST elevated myocardial infarction) (HCC) 07/09/2019   Past Surgical History:  Procedure Laterality Date   ABDOMINAL HYSTERECTOMY  1983   for endometriosis with Burch   ANGIOPLASTY     ANGIOPLASTY     BACK SURGERY     1990   CARDIAC SURGERY     Catheterization   CORONARY STENT INTERVENTION N/A 07/09/2019   Procedure: CORONARY STENT INTERVENTION;  Surgeon: Yates Decamp, MD;  [facility];  Service: Cardiovascular;  Laterality: N/A;   LEFT HEART CATH AND CORONARY ANGIOGRAPHY N/A 07/09/2019   Procedure: LEFT HEART CATH AND CORONARY ANGIOGRAPHY and possible intervention;  Surgeon: Yates Decamp, MD;  [facility]: [facility];  Service: Cardiovascular;  Laterality: N/A;  4: or 4:30 PM today   LUMBAR LAMINECTOMY/DECOMPRESSION MICRODISCECTOMY Left 02/02/2022   Procedure: Left Lumbar two-three, Lumbar five-Sacral one Sublaminar decompression;  Surgeon: Barnett Abu, MD;  Location: [facility] OR;  Service: Neurosurgery;  Laterality: Left;   NECK SURGERY     TOTAL KNEE ARTHROPLASTY Left 10/07/2020   Procedure: TOTAL KNEE ARTHROPLASTY;  Surgeon: Durene Romans, MD;  Location: [facility];   Service: Orthopedics;  Laterality: Left;  70 mins   TOTAL KNEE REVISION Left 05/13/2022   Procedure: SYNOVECTOMY AND LEFT KNEE POLY-LINER EXCHANGE;  Surgeon: Kathryne Hitch, MD;  Location: [facility] OR;  Service: Orthopedics;  Laterality: Left;   Patient Active Problem List   Diagnosis Date Noted   Status post revision of total replacement of left knee 05/13/2022   Degenerative lumbar spinal stenosis 02/02/2022   S/P left TKA 10/07/2020   Left knee OA 10/07/2020   Syncope 01/16/2020   Anemia 01/16/2020   Thrombocytopenia (HCC) 01/16/2020   Paroxysmal atrial fibrillation (HCC) 02/15/2019   Mixed hyperlipidemia 02/15/2019   Bradycardia 02/15/2019   Coronary artery disease involving native coronary artery of native heart with unstable angina pectoris (HCC) 12/20/2018   Dyslipidemia 12/20/2018   Essential hypertension 12/20/2018    PCP: Chilton Greathouse, MD   REFERRING PROVIDER: Kathryne Hitch*   REFERRING DIAG:  Diagnosis  M75.41 (ICD-10-CM) - Impingement syndrome of right shoulder    THERAPY DIAG:  Acute pain of right shoulder  Muscle weakness (generalized)  Localized edema  Rationale for Evaluation and Treatment: Rehabilitation  ONSET DATE: 6 months ago  SUBJECTIVE:  SUBJECTIVE STATEMENT: Pt stating no pain at rest. Pt reporting pain of 1/10 with reaching upward.    PERTINENT HISTORY: 05/13/2022, TKA revision, back surgery for ruptured disc 3 months ago per patient report, Lt TKA revision 05/13/22 HTN, MI, A-fib, CAD, L lumbar laminectomy, atherosclerosis, angina, HTN  PAIN:  NPRS scale: 1/10 Pain location: Rt shoulder Pain description: achy, sharp Aggravating factors: reaching, throwing, lifting up in cabinets Relieving factors: resting, tylenol  PRECAUTIONS: None  WEIGHT  BEARING RESTRICTIONS: No  FALLS:  Has patient fallen in last 6 months? No  LIVING ENVIRONMENT: Lives with: lives with their family, daughter lives with pt Lives in: House/apartment Stairs: No, bedroom on main floor, ramp to enter Has following equipment at home: Environmental consultant - 2 wheeled, st cane  OCCUPATION: retired  PLOF: Independent  PATIENT GOALS:Be able to reach into cabinet without pain and throw if needed using Rt arm  Next MD visit:   OBJECTIVE:   DIAGNOSTIC FINDINGS:  2023:  AP, scapular Y, axillary views of right shoulder reviewed.  No significant  degenerative changes of the right glenohumeral joint.  Mild degenerative  changes of the [facility] joint.  No loss of acromiohumeral interval.  No fracture  or dislocation.   PATIENT SURVEYS:  10/18/23: FOTO intake:     55% 11/07/23: FOTO update:  65%  COGNITION: Overall cognitive status: WFL     SENSATION: WFL  POSTURE: Forward shoulders, rounded head  UPPER EXTREMITY ROM:   ROM Right 10/18/23 Left 10/18/23 Rt 11/07/23  Shoulder flexion 140 148 155  Shoulder extension 65 65   Shoulder abduction 140 145 160  Shoulder adduction     Shoulder internal rotation 60  shoulder abd 45  60 Shoulder abd 45 deg 70 shoulder abd 45  Shoulder external rotation 75  82  Elbow flexion     Elbow extension     (Blank rows = not tested)  UPPER EXTREMITY MMT:  MMT Right 10/18/23 Left 10/18/23 Rt 11/07/23  Shoulder flexion 3 5 4+  Shoulder extension 4 5 4+  Shoulder abduction 3 5 4+  Shoulder adduction     Shoulder internal rotation 5 5 5   Shoulder external rotation 5 5 5   Middle trapezius     Lower trapezius     Elbow flexion     Elbow extension     Grip strength (lbs)     (Blank rows = not tested)  SHOULDER SPECIAL TESTS: Impingement tests: Painful arc test: positive    JOINT MOBILITY TESTING:  10/18/23: limited mobility in Rt GH joint inferior glides  PALPATION:  TTP: Rt anterior and lateral shoulder  TODAY'S TREATMENT:                                                                                                       DATE: 11/07/23:  TherEx:  Updated pt's HEP and reviewed each exercise with 5-10 reps Updated ROM and Strength (see charts above)     10/31/23:  Therex: Pulleys: x 3 minutes scaption  Row: green TB 2 x 15 holding 3 sec ER red TB 2 x 15  Scapular stabilization: shoulder 80-90 deg circles both direction x 15  Isometrics: flexion, extension, ER, and IR x 10 holding 5 sec Rolling ball up and down wall x 10 holding end range x 3 sec Standing AAROM c 1# bar  2 x 10  Standing Rt shoulder abd c 1 # bar 2 x 10  Lifting 1# weight onto 1st clinic gym shelf x 10     10/18/23:  Therex:    HEP instruction/performance c cues for techniques, handout provided.  Trial set performed of each for comprehension and symptom assessment.  See below for exercise list   PATIENT EDUCATION: Education details: updated HEP, POC Person educated: Patient Education method: Explanation, Demonstration, Verbal cues, and Handouts Education comprehension: verbalized understanding, returned demonstration, and verbal cues required  HOME EXERCISE PROGRAM: Access Code: [email or web address] URL: [email or web address] Date: 11/07/2023 Prepared by: Narda Amber  Exercises - Supine Shoulder Flexion Extension AAROM with Dowel  - 1-2 x daily - 7 x weekly - 2 sets - 10 reps - Shoulder External Rotation and Scapular Retraction with Resistance  - 1-2 x daily - 7 x weekly - 2 sets - 10 reps - 3 seconds hold - Standing Row with Anchored Resistance  - 1-2 x daily - 7 x weekly - 2 sets - 10 reps - 3 seconds hold - Sidelying Shoulder Abduction Palm Forward  - 1-2 x daily - 7 x weekly - 10 reps - Standing Shoulder Flexion  to 90 Degrees with Dumbbells  - 1-2 x daily - 7 x weekly - 10 reps - Standing Wall Ball Circles with Plyo Ball  - 1-2 x daily - 7 x weekly - 10 reps  ASSESSMENT:  CLINICAL IMPRESSION: Pt stating she feels she needs to hold therapy at present time and work on her shoulder exercises at home. Pt is concerned she is going to have to have therapy on her knee and wants to work on her shoulder at home.  Pt has improved you FOTO score by 10% and have improved your ROM and strength since beginning therapy. Pt has currently met her STG and 3 of her 6 LTG's since beginning therapy. Pt requesting to be discharged from skilled PT at present time.     OBJECTIVE IMPAIRMENTS: decreased ROM, decreased strength, increased edema, impaired UE functional use, postural dysfunction, and pain.   ACTIVITY LIMITATIONS: lifting, toileting, dressing, reach over head, and hygiene/grooming  PARTICIPATION LIMITATIONS: cleaning, laundry, and community activity  PERSONAL FACTORS: 3+ comorbidities: see pertinent history above  are also affecting patient's functional outcome.   REHAB POTENTIAL: Good  CLINICAL DECISION MAKING: Stable/uncomplicated  EVALUATION COMPLEXITY: Low   GOALS: Goals reviewed with patient? Yes  SHORT TERM GOALS: (target date for Short term goals are 3 weeks 11/08/2023)  1.Patient will demonstrate independent use of home exercise program to maintain progress from in clinic treatments. Goal status: MET 11/07/23  LONG TERM GOALS: (target dates for all long term goals are 10 weeks  12/27/2023 )   1. Patient will demonstrate/report pain at worst less than or equal to 2/10 to facilitate minimal limitation in daily activity secondary to pain symptoms. Goal status: On-going 11/07/23, depends on activity level    2. Patient will demonstrate independent use of home exercise program to facilitate ability to maintain/progress functional gains from skilled physical therapy services. Goal status:  MET  11/07/23   3. Patient will demonstrate FOTO outcome > or = 64 % to indicate reduced disability due to condition. Goal status: MET 11/07/23  4.  Patient will demonstrate Rt UE MMT >/= /5 throughout to facilitate lifting, reaching, carrying at [facility] in daily activity.   Goal status: on-going 11/07/23     5.  Patient will demonstrate Rt GH joint AROM WFL s symptoms to facilitate usual overhead reaching, self care, dressing at PLOF.    Goal status: on-going 11/07/23   6.  pt will be able to lift 3# object onto over head shelf with her Rt UE with pain </= 2#.  Goal status: MET 11/07/23   PLAN:  PT FREQUENCY: 1-2x/week  PT DURATION: 10 weeks  PLANNED INTERVENTIONS: Can include 16109- PT Re-evaluation, 97110-Therapeutic exercises, 97530- Therapeutic activity, 97112- Neuromuscular re-education, 97535- Self Care, 97140- Manual therapy, [id number]- Gait training, [phone]- Orthotic Fit/training, [phone]- Canalith repositioning, [id number]- Aquatic Therapy, 97014- Electrical stimulation (unattended), [id number]- Electrical stimulation (manual), [id number]- Vasopneumatic device, [id number]- Ultrasound, [id number]- Traction (mechanical), [id number]- Ionotophoresis 4mg /ml Dexamethasone, Patient/Family education, Balance training, Stair training, Taping, Dry Needling, Joint mobilization, Joint manipulation, Spinal manipulation, Spinal mobilization, Scar mobilization, Vestibular training, Visual/preceptual remediation/compensation, DME instructions, Cryotherapy, and Moist heat.  All performed as medically necessary.  All included unless contraindicated  PLAN FOR NEXT SESSION:  Discharge pt due to pt's request    Sharmon Leyden, PT, MPT 11/07/2023, 3:16 PM   PHYSICAL THERAPY DISCHARGE SUMMARY  Visits from Start of Care: 3  Current functional level related to goals / functional outcomes: See above   Remaining deficits: See above   Education / Equipment: Updated HEP   Patient agrees to discharge. Patient goals were partially met.  Patient is being discharged due to the patient's request.

## 2023-11-14 ENCOUNTER — Encounter: Payer: Medicare Other | Admitting: Physical Therapy

## 2023-11-16 ENCOUNTER — Ambulatory Visit: Payer: Medicare Other | Admitting: Orthopaedic Surgery

## 2023-11-21 ENCOUNTER — Encounter: Payer: Medicare Other | Admitting: Physical Therapy

## 2023-11-21 ENCOUNTER — Ambulatory Visit: Payer: Medicare Other | Admitting: Physician Assistant

## 2023-11-28 ENCOUNTER — Encounter: Payer: Medicare Other | Admitting: Physical Therapy

## 2023-12-05 ENCOUNTER — Encounter: Payer: Medicare Other | Admitting: Physical Therapy

## 2023-12-12 ENCOUNTER — Encounter: Payer: Medicare Other | Admitting: Physical Therapy

## 2023-12-14 ENCOUNTER — Ambulatory Visit: Payer: Medicare Other | Admitting: Orthopaedic Surgery

## 2023-12-19 ENCOUNTER — Encounter: Payer: Medicare Other | Admitting: Physical Therapy

## 2023-12-19 ENCOUNTER — Ambulatory Visit (INDEPENDENT_AMBULATORY_CARE_PROVIDER_SITE_OTHER): Payer: Medicare Other

## 2023-12-19 ENCOUNTER — Other Ambulatory Visit: Payer: Self-pay | Admitting: Podiatry

## 2023-12-19 ENCOUNTER — Telehealth: Payer: Self-pay | Admitting: Cardiology

## 2023-12-19 ENCOUNTER — Encounter: Payer: Self-pay | Admitting: Podiatry

## 2023-12-19 ENCOUNTER — Ambulatory Visit: Payer: Medicare Other | Admitting: Podiatry

## 2023-12-19 DIAGNOSIS — Z860100 Personal history of colon polyps, unspecified: Secondary | ICD-10-CM | POA: Insufficient documentation

## 2023-12-19 DIAGNOSIS — I6523 Occlusion and stenosis of bilateral carotid arteries: Secondary | ICD-10-CM | POA: Insufficient documentation

## 2023-12-19 DIAGNOSIS — K5792 Diverticulitis of intestine, part unspecified, without perforation or abscess without bleeding: Secondary | ICD-10-CM | POA: Insufficient documentation

## 2023-12-19 DIAGNOSIS — K625 Hemorrhage of anus and rectum: Secondary | ICD-10-CM | POA: Insufficient documentation

## 2023-12-19 DIAGNOSIS — M2041 Other hammer toe(s) (acquired), right foot: Secondary | ICD-10-CM

## 2023-12-19 DIAGNOSIS — K602 Anal fissure, unspecified: Secondary | ICD-10-CM | POA: Insufficient documentation

## 2023-12-19 DIAGNOSIS — Z7901 Long term (current) use of anticoagulants: Secondary | ICD-10-CM | POA: Insufficient documentation

## 2023-12-19 DIAGNOSIS — M6740 Ganglion, unspecified site: Secondary | ICD-10-CM

## 2023-12-19 DIAGNOSIS — R1084 Generalized abdominal pain: Secondary | ICD-10-CM | POA: Insufficient documentation

## 2023-12-19 DIAGNOSIS — R110 Nausea: Secondary | ICD-10-CM | POA: Insufficient documentation

## 2023-12-19 DIAGNOSIS — R195 Other fecal abnormalities: Secondary | ICD-10-CM | POA: Insufficient documentation

## 2023-12-19 DIAGNOSIS — M25562 Pain in left knee: Secondary | ICD-10-CM | POA: Insufficient documentation

## 2023-12-19 DIAGNOSIS — R198 Other specified symptoms and signs involving the digestive system and abdomen: Secondary | ICD-10-CM | POA: Insufficient documentation

## 2023-12-19 DIAGNOSIS — G589 Mononeuropathy, unspecified: Secondary | ICD-10-CM | POA: Insufficient documentation

## 2023-12-19 NOTE — Progress Notes (Signed)
   Chief Complaint  Patient presents with   Toe Pain    (Xrays done on [facility]) 2nd toe right - hammertoe x years, developing corn from where all her shoes are starting to rub, very tender at times, toenail is also thickened   New Patient (Initial Visit)    HPI: 83 y.o. female presenting today as a new patient for evaluation of pain and tenderness associated to the right second toe.  She says she has a long history of a hammertoe deformity to the right second toe for several years.  At times it can be very tender and painful.  Currently the patient lives with her mother and cares for her.  She recently had revisional left knee surgery and possibly due for right knee surgery coming up in the future.  Past Medical History:  Diagnosis Date   Anginal pain (HCC) 1989   Anxiety    Arthritis    Atherosclerosis    Atrial fibrillation (HCC)    Coronary artery disease    Dysrhythmia    a-fib   Elevated liver enzymes    GERD (gastroesophageal reflux disease)    Heart attack (HCC) 2020   Heart disease    High cholesterol    Hypertension    NSTEMI (non-ST elevated myocardial infarction) (HCC) 07/09/2019      Objective: Physical Exam General: The patient is alert and oriented x3 in no acute distress.  Dermatology: Skin is cool, dry and supple bilateral lower extremities. Negative for open lesions or macerations.  Hyperkeratotic callus lesion noted overlying the PIPJ of the right second toe  Vascular: Palpable pedal pulses bilaterally. No edema or erythema noted. Capillary refill within normal limits.  Neurological: Grossly intact via light touch  Musculoskeletal Exam: All pedal and ankle joints range of motion within normal limits bilateral. Muscle strength 5/5 in all groups bilateral. Hammertoe contracture deformity noted to the second digit of the right foot  Radiographic Exam RT foot 12/19/2023: Hammertoe contracture deformity noted to the interphalangeal joints and MPJ of the  respective hammertoe digits mentioned on clinical musculoskeletal exam.     Assessment: 1.  Symptomatic hammertoe right second digit 2.  Callus overlying the PIPJ of the right second digit   Plan of Care:  -Patient evaluated. X-Rays reviewed.  -Excisional debridement of the hyperkeratotic callus overlying the PIPJ of the second digit was performed today using a 312 scalpel without incident or bleeding -Discussed both surgical and conservative options today to correct for or alleviate the hammertoe deformity.  For now the patient will continue to pursue conservative options including shoes that allow plenty of room in the toebox area and do not constrict the toes -Silicone toe sleeve was dispensed to alleviate pressure from the hammertoe -Return to clinic as needed  Thresa EMERSON Sar, DPM [facility]  Dr. Thresa EMERSON Sar, DPM    [address] [place], [place] [address]                Office [phone]  Fax [phone]

## 2023-12-19 NOTE — Telephone Encounter (Signed)
 Patient came in requesting Eliquis samples

## 2023-12-20 NOTE — Telephone Encounter (Signed)
 Patient is 73 F with weight < 60 kg.  She needs to drop to Eliquis 2.5 mg based on age/body weight.  Last SCr was in 2023, will have drawn again at appt with MD in Feb.  Please explain decrease in dose to patient and okay to give samples of Eliquis 2.5 mg

## 2023-12-21 NOTE — Telephone Encounter (Signed)
 Called pt and pt had already pick up medication Eliquis  from her pharmacy. I advised pt that if she has any other problems, questions or concerns, to give our office a call back. Pt verbalized understanding.

## 2023-12-22 ENCOUNTER — Ambulatory Visit
Admission: EM | Admit: 2023-12-22 | Discharge: 2023-12-22 | Disposition: A | Discharge status: Home / Self Care | Payer: Medicare Other | Attending: Family Medicine | Admitting: Family Medicine

## 2023-12-22 VITALS — BP 120/67 | HR 58 | Temp 98.4°F | Resp 16

## 2023-12-22 DIAGNOSIS — S81811A Laceration without foreign body, right lower leg, initial encounter: Secondary | ICD-10-CM | POA: Diagnosis not present

## 2023-12-22 MED ORDER — CEPHALEXIN 500 MG PO CAPS: 500.0000 mg | ORAL_CAPSULE | Freq: Three times a day (TID) | ORAL | 0 refills | Status: AC

## 2023-12-22 NOTE — Discharge Instructions (Addendum)
Please keep the wound clean and dry.  Change the dressing daily unless it becomes soiled or saturated.  Start Keflex 3 times a day for 5 days.  Please of this medication can interfere with your Eliquis so if you develop any signs of bleeding such as bleeding from your gums or blood in your urine please contact your cardiologist right away to make them aware.  Monitor for any signs of infection which include but are not limited to fever, chills, redness, swelling, drainage, warmth and seek reevaluation in the emergency room if these occur.  Follow-up with your PCP in 2 to 3 days for recheck.  I hope you feel better soon!

## 2023-12-22 NOTE — ED Triage Notes (Signed)
Pt states she cut her right lower leg on a cinder block 2 days ago. Laceration noted bleeding controlled. Pt states she has been putting neosporin on it.

## 2023-12-22 NOTE — ED Provider Notes (Signed)
[facility]    CSN: [id number] Arrival date & time: 12/22/23  1415      History   Chief Complaint Chief Complaint  Patient presents with   Laceration    HPI Erica Frank is a 83 y.o. female presents for a laceration.  Patient reports 2 days ago she cut her right lower leg on a cinder block.  She states she cleaned it out and has been applying Neosporin to the area.  She reports bleeding is controlled but she is on Eliquis.  She is up-to-date on her tetanus from 2019.  Denies any numbness/tingling/drainage, fevers or chills.  No history of MRSA.  No other concerns or injuries at this time   Laceration   Past Medical History:  Diagnosis Date   Anginal pain (HCC) 1989   Anxiety    Arthritis    Atherosclerosis    Atrial fibrillation (HCC)    Coronary artery disease    Dysrhythmia    a-fib   Elevated liver enzymes    GERD (gastroesophageal reflux disease)    Heart attack (HCC) 2020   Heart disease    High cholesterol    Hypertension    NSTEMI (non-ST elevated myocardial infarction) (HCC) 07/09/2019    Patient Active Problem List   Diagnosis Date Noted   Altered bowel function 12/19/2023   Anal fissure 12/19/2023   Bilateral carotid artery stenosis 12/19/2023   Diverticulitis 12/19/2023   Generalized abdominal pain 12/19/2023   Blood per rectum 12/19/2023   History of colonic polyps 12/19/2023   Anticoagulated 12/19/2023   Nausea 12/19/2023   Acute pain of left knee 12/19/2023   Pinched nerve in neck 12/19/2023   Positive fecal immunochemical test 12/19/2023   Status post revision of total replacement of left knee 05/13/2022   Trigger middle finger of left hand 03/05/2022   Degenerative lumbar spinal stenosis 02/02/2022   Ischial bursitis 12/21/2021   Pain of left hip joint 12/21/2021   Swelling of right foot 08/27/2021   S/P left TKA 10/07/2020   Left knee OA 10/07/2020   Acquired trigger finger of left middle finger 04/22/2020   Elevated  blood-pressure reading, without diagnosis of hypertension 03/21/2020   Syncope 01/16/2020   Anemia 01/16/2020   Thrombocytopenia (HCC) 01/16/2020   Stool color abnormal 12/04/2019   Gout 11/28/2019   Cervical myelopathy (HCC) 11/23/2019   Neck pain 11/09/2019   Hypertensive heart disease without congestive heart failure 08/02/2019   Residual hemorrhoidal skin tags 08/02/2019   Fatigue 06/21/2019   Paroxysmal atrial fibrillation (HCC) 02/15/2019   Mixed hyperlipidemia 02/15/2019   Bradycardia 02/15/2019   Coronary artery disease involving native coronary artery of native heart with unstable angina pectoris (HCC) 12/20/2018   Dyslipidemia 12/20/2018   Essential hypertension 12/20/2018   Affective psychosis (HCC) 09/20/2016   Benign neoplasm of colon 09/20/2016   Overactive bladder 09/09/2015   Encounter for general adult medical examination without abnormal findings 09/03/2015   Idiopathic scoliosis and kyphoscoliosis 05/28/2015   Low back pain 05/28/2015   Insomnia 11/24/2009   Acute stress reaction 11/21/2009   Gastro-esophageal reflux disease without esophagitis 11/21/2009    Past Surgical History:  Procedure Laterality Date   ABDOMINAL HYSTERECTOMY  1983   for endometriosis with Burch   ANGIOPLASTY     ANGIOPLASTY     BACK SURGERY     1990   CARDIAC SURGERY     Catheterization   CORONARY STENT INTERVENTION N/A 07/09/2019   Procedure: CORONARY STENT INTERVENTION;  Surgeon:  Yates Decamp, MD;  Location: [facility] INVASIVE CV LAB;  Service: Cardiovascular;  Laterality: N/A;   LEFT HEART CATH AND CORONARY ANGIOGRAPHY N/A 07/09/2019   Procedure: LEFT HEART CATH AND CORONARY ANGIOGRAPHY and possible intervention;  Surgeon: Yates Decamp, MD;  Location: [facility] INVASIVE CV LAB;  Service: Cardiovascular;  Laterality: N/A;  4: or 4:30 PM today   LUMBAR LAMINECTOMY/DECOMPRESSION MICRODISCECTOMY Left 02/02/2022   Procedure: Left Lumbar two-three, Lumbar five-Sacral one Sublaminar decompression;   Surgeon: Barnett Abu, MD;  Location: [facility] OR;  Service: Neurosurgery;  Laterality: Left;   NECK SURGERY     TOTAL KNEE ARTHROPLASTY Left 10/07/2020   Procedure: TOTAL KNEE ARTHROPLASTY;  Surgeon: Durene Romans, MD;  Location: [facility] ORS;  Service: Orthopedics;  Laterality: Left;  70 mins   TOTAL KNEE REVISION Left 05/13/2022   Procedure: SYNOVECTOMY AND LEFT KNEE POLY-LINER EXCHANGE;  Surgeon: Kathryne Hitch, MD;  Location: [facility] OR;  Service: Orthopedics;  Laterality: Left;    OB History     Gravida  1   Para  1   Term      Preterm      AB      Living  1      SAB      IAB      Ectopic      Multiple      Live Births               Home Medications    Prior to Admission medications   Medication Sig Start Date End Date Taking? Authorizing Provider  cephALEXin (KEFLEX) 500 MG capsule Take 1 capsule (500 mg total) by mouth 3 (three) times daily for 5 days. 12/22/23 12/27/23 Yes Radford Pax, NP  acetaminophen (TYLENOL) 500 MG tablet Take 1,000 mg by mouth daily as needed (pain).    [provider]  Alirocumab (PRALUENT) 75 MG/ML SOAJ Inject 1 mL (75 mg total) into the skin every 14 (fourteen) days. 10/21/23   Yates Decamp, MD  allopurinol (ZYLOPRIM) 100 MG tablet Take 100 mg by mouth daily. 11/24/21   [provider]  apixaban (ELIQUIS) 2.5 MG TABS tablet Take 1 tablet (2.5 mg total) by mouth 2 (two) times daily. Dose decrease 10/21/23   Yates Decamp, MD  BIOTIN PO Take 1 tablet by mouth daily. Hair, skin & nails    [provider]  ezetimibe-simvastatin (VYTORIN) 10-40 MG tablet Take 1 tablet by mouth daily. 01/31/23   Yates Decamp, MD  isosorbide mononitrate (IMDUR) 60 MG 24 hr tablet TAKE 1 TABLET(60 MG) BY MOUTH DAILY 12/29/22   Yates Decamp, MD  lisinopril (ZESTRIL) 5 MG tablet TAKE 1 TABLET(5 MG) BY MOUTH DAILY 12/29/22   Yates Decamp, MD  nitroGLYCERIN (NITROSTAT) 0.4 MG SL tablet Place 1 tablet (0.4 mg total) under the tongue every 5 (five) minutes  as needed for chest pain. 12/08/22   Yates Decamp, MD  Polyvinyl Alcohol-Povidone PF (REFRESH) 1.4-0.6 % SOLN Place 1 drop into both eyes daily as needed (Dry eyes).    [provider]  traMADol (ULTRAM) 50 MG tablet Take 100 mg by mouth daily. 03/31/22   [provider]  traZODone (DESYREL) 50 MG tablet Take 50 mg by mouth at bedtime as needed. 05/03/23   [provider]    Family History Family History  Problem Relation Age of Onset   Uterine cancer Mother    Hypertension Brother    Heart disease Brother    Breast cancer Cousin  Social History Social History   Tobacco Use   Smoking status: Never   Smokeless tobacco: Never  Vaping Use   Vaping status: Never Used  Substance Use Topics   Alcohol use: Not Currently    Alcohol/week: 3.0 standard drinks of alcohol    Types: 3 Glasses of wine per week    Comment: "I havent in several months" 05/06/22   Drug use: No     Allergies   Hydrocodone and Repatha [evolocumab]   Review of Systems Review of Systems  Skin:  Positive for wound.     Physical Exam Triage Vital Signs ED Triage Vitals  Encounter Vitals Group     BP 12/22/23 1441 120/67     Systolic BP Percentile --      Diastolic BP Percentile --      Pulse Rate 12/22/23 1441 (!) 58     Resp 12/22/23 1441 16     Temp 12/22/23 1441 98.4 F (36.9 C)     Temp Source 12/22/23 1441 Oral     SpO2 12/22/23 1441 95 %     Weight --      Height --      Head Circumference --      Peak Flow --      Pain Score 12/22/23 1440 0     Pain Loc --      Pain Education --      Exclude from Growth Chart --    No data found.  Updated Vital Signs BP 120/67 (BP Location: Right Arm)   Pulse (!) 58   Temp 98.4 F (36.9 C) (Oral)   Resp 16   SpO2 95%   Visual Acuity Right Eye Distance:   Left Eye Distance:   Bilateral Distance:    Right Eye Near:   Left Eye Near:    Bilateral Near:     Physical Exam Vitals and nursing note reviewed.   Constitutional:      General: She is not in acute distress.    Appearance: Normal appearance. She is not ill-appearing.  HENT:     Head: Normocephalic and atraumatic.  Eyes:     Pupils: Pupils are equal, round, and reactive to light.  Cardiovascular:     Rate and Rhythm: Normal rate.  Pulmonary:     Effort: Pulmonary effort is normal.  Skin:    General: Skin is warm and dry.          Comments: 5 cm curved laceration to the medial aspect of the right lower leg.  No bleeding.  Minimal swelling.  Area is tender to palpation.  No drainage warmth or erythema.  See photo.  Neurological:     General: No focal deficit present.     Mental Status: She is alert and oriented to person, place, and time.  Psychiatric:        Mood and Affect: Mood normal.        Behavior: Behavior normal.      UC Treatments / Results  Labs (all labs ordered are listed, but only abnormal results are displayed) Labs Reviewed - No data to display  Comprehensive metabolic panel Order: [id number]  Status: Final result     Next appt: 01/17/2024 at 02:00 PM in Cardiology Yates Decamp, MD)   Test Result Released: Yes (not seen)   0 Result Notes          Component Ref Range & Units (hover) 1 yr ago (06/05/22) 1 yr ago (05/14/22) 1 yr ago (  05/06/22) 1 yr ago (02/02/22) 2 yr ago (06/06/21) 3 yr ago (10/08/20) 3 yr ago (09/29/20)  Sodium 134 Low  137 139 136 136 136 139  Potassium 4.0 4.6 4.5 4.3 4.2 4.8 4.3  Chloride 99 103 105 100 101 105 104  CO2 25 30 26 28 27 23 26   Glucose, Bld 97 119 High  CM 97 CM 100 High  CM 114 High  CM 129 High  CM 90 CM  Comment: Glucose reference range applies only to samples taken after fasting for at least 8 hours.  BUN 19 6 Low  17 21 13 15 19   Creatinine, Ser 0.83 0.79 0.80 0.96 0.81 0.74 0.95  Calcium 9.8 8.9 8.9 8.7 Low  9.3 8.8 Low  9.0  Total Protein 7.0  6.3 Low   6.9    Albumin 4.2  3.7  3.6    AST 19  28  23     ALT 14  25  23     Alkaline Phosphatase 70  66  67     Total Bilirubin 0.4  0.3  0.6    GFR, Estimated >60 >60 CM >60 CM 60 Low  CM >60 CM >60 CM >60 CM  Comment: (NOTE) Calculated using the CKD-EPI Creatinine Equation (2021)  Anion gap 10 4 Low  CM 8 CM 8 CM 8 CM 8 CM 9 CM  Comment: Performed at [facility], [address], [place], [place]      EKG   Radiology No results found.  Procedures Procedures (including critical care time)  Medications Ordered in UC Medications - No data to display  Initial Impression / Assessment and Plan / UC Course  I have reviewed the triage vital signs and the nursing notes.  Pertinent labs & imaging results that were available during my care of the patient were reviewed by me and considered in my medical decision making (see chart for details).     Reviewed injury with patient.  60-day-old laceration to right lower leg.  Wound was cleansed by nursing staff and bacitracin and nonadherent dressing applied.  Discussed wound care.  Will start Keflex as wound is unable to be closed.  Advised that this can interact with her Eliquis and discussed if she develops any bleeding from gums or in urine she is to notify her cardiologist right away.  Discussed infection signs and symptoms as well.  Advised PCP follow-up 2 to 3 days for recheck.  ER precautions reviewed and patient verbalized understanding. Final Clinical Impressions(s) / UC Diagnoses   Final diagnoses:  Laceration of right lower leg, initial encounter     Discharge Instructions      Please keep the wound clean and dry.  Change the dressing daily unless it becomes soiled or saturated.  Start Keflex 3 times a day for 5 days.  Please of this medication can interfere with your Eliquis so if you develop any signs of bleeding such as bleeding from your gums or blood in your urine please contact your cardiologist right away to make them aware.  Monitor for any signs of infection which include but are not limited to fever,  chills, redness, swelling, drainage, warmth and seek reevaluation in the emergency room if these occur.  Follow-up with your PCP in 2 to 3 days for recheck.  I hope you feel better soon!     ED Prescriptions     Medication Sig Dispense Auth. Provider   cephALEXin (KEFLEX) 500 MG capsule Take 1 capsule (  500 mg total) by mouth 3 (three) times daily for 5 days. 15 capsule Radford Pax, NP      PDMP not reviewed this encounter.   Radford Pax, NP 12/22/23 [phone]

## 2023-12-26 ENCOUNTER — Other Ambulatory Visit: Payer: Self-pay | Admitting: Cardiology

## 2024-01-06 ENCOUNTER — Other Ambulatory Visit (HOSPITAL_COMMUNITY): Payer: Self-pay

## 2024-01-06 ENCOUNTER — Telehealth: Payer: Self-pay | Admitting: Pharmacy Technician

## 2024-01-06 NOTE — Telephone Encounter (Signed)
-----   Message from Olene Floss sent at 01/06/2024  1:09 PM EST ----- Please do PA for Praluent. Pt intolerant to Repatha. thanks

## 2024-01-06 NOTE — Telephone Encounter (Signed)
Pharmacy Patient Advocate Encounter   Received notification from Physician's Office that prior authorization for praluent is required/requested.   Insurance verification completed.   The patient is insured through [facility] .   Per test claim: PA required; PA submitted to above mentioned insurance via CoverMyMeds Key/confirmation #/EOC [id number] Status is pending

## 2024-01-09 ENCOUNTER — Telehealth: Payer: Self-pay

## 2024-01-09 ENCOUNTER — Other Ambulatory Visit (HOSPITAL_COMMUNITY): Payer: Self-pay

## 2024-01-09 DIAGNOSIS — Z01818 Encounter for other preprocedural examination: Secondary | ICD-10-CM

## 2024-01-09 DIAGNOSIS — I480 Paroxysmal atrial fibrillation: Secondary | ICD-10-CM

## 2024-01-09 NOTE — Telephone Encounter (Unsigned)
Patient with diagnosis of afib on Eliquis for anticoagulation.    Procedure:  Lumbar spine L3-L4 translam  Date of procedure: 01/16/24   CHA2DS2-VASc Score = 6  {Click here to calculate score.  REFRESH note before signing. :1} This indicates a 9.7% annual risk of stroke. The patient's score is based upon: CHF History: 1 HTN History: 1 Diabetes History: 0 Stroke History: 0 Vascular Disease History: 1 Age Score: 2 Gender Score: 1   {This patient has a significant risk of stroke if diagnosed with atrial fibrillation.  Please consider VKA or DOAC agent for anticoagulation if the bleeding risk is acceptable.   You can also use the SmartPhrase .[email or web address] for documentation.   :[id number]}   CrCl *** Platelet count ***  Patient does/does not*** require pre-op antibiotics for dental procedure.  Per office protocol, patient can hold *** for *** days prior to procedure.   Patient ***will/will not need bridging with Lovenox (enoxaparin) around procedure.  **This guidance is not considered finalized until pre-operative APP has relayed final recommendations.**

## 2024-01-09 NOTE — Telephone Encounter (Signed)
Pharmacy Patient Advocate Encounter  Received notification from [facility] that Prior Authorization for praluent has been APPROVED from 01/09/24 to 12/05/24. Unable to obtain price due to refill too soon rejection, last fill date 12/27/23 next available fill date02/11/25   PA #/Case ID/Reference #: [id number]

## 2024-01-09 NOTE — Telephone Encounter (Signed)
   Pre-operative Risk Assessment    Patient Name: Erica Frank  DOB: [birth year] MRN: [id number]   Date of last office visit: 06/27/23 Date of next office visit: 01/17/24   Request for Surgical Clearance    Procedure:   Lumbar spine L3-L4 translam   Date of Surgery:  Clearance 01/16/24                                 Surgeon:  Barnett Abu, MD  Surgeon's Group or Practice Name:  [facility]  Phone number:  [phone] Fax number:  [phone]   Type of Clearance Requested:   - Medical  - Pharmacy:  Hold Apixaban (Eliquis) 3 days before surgery    Type of Anesthesia:   Sedation    Additional requests/questions:    Vance Peper   01/09/2024, 2:23 PM

## 2024-01-12 ENCOUNTER — Ambulatory Visit: Payer: Medicare Other | Admitting: Physician Assistant

## 2024-01-12 ENCOUNTER — Other Ambulatory Visit (INDEPENDENT_AMBULATORY_CARE_PROVIDER_SITE_OTHER): Payer: Medicare Other

## 2024-01-12 ENCOUNTER — Encounter: Payer: Self-pay | Admitting: Physician Assistant

## 2024-01-12 VITALS — Ht 61.0 in | Wt 127.0 lb

## 2024-01-12 DIAGNOSIS — M25561 Pain in right knee: Secondary | ICD-10-CM

## 2024-01-12 DIAGNOSIS — M1711 Unilateral primary osteoarthritis, right knee: Secondary | ICD-10-CM | POA: Diagnosis not present

## 2024-01-12 NOTE — Telephone Encounter (Signed)
 I will update all parties involved pt has appt 01/17/24 with Dr. Berry Bristol. See notes from preop APP.

## 2024-01-12 NOTE — Telephone Encounter (Signed)
 Per Pharm.D. patient will require BMP before recommendations can be made for holding of Eliquis .  Unfortunately it is unlikely that final recommendations for Eliquis  holding and medical clearance can be provided prior to surgery on 01/16/2024 given need for labwork.  Please call and notify patient of need for BMP and possible need to delay surgery.

## 2024-01-12 NOTE — Telephone Encounter (Signed)
   Patient Name: Erica Frank  DOB: 1941-01-07 MRN: [id number]  Primary Cardiologist: LELON Jacques Somerset, MD  Chart reviewed as part of pre-operative protocol coverage.   Patient will require appointment for medical clearance. Given need for lab work before pharmacy can provide recommendation for holding of Eliquis  and need for preoperative cardiac evaluation would recommend that surgery be delayed. She has appointment scheduled with Dr. Ladona on 2/11, if surgery is delayed preoperative clearance can be addressed at office visit.    Ameet Sandy D Shoua Ulloa, NP 01/12/2024, 11:40 AM

## 2024-01-12 NOTE — Progress Notes (Signed)
 HPI: Erica Frank comes in today due to right knee pain.  She states she has had pain in in the right knee on and off for the past month.  Today she is having no significant pain.  She does not want any surgery on her knee if she can avoid it.  She still has pain in her left knee which she underwent a total knee arthroplasty some years ago.  He then underwent a poly liner exchange in 2023 by Dr. Vernetta.  She states the pain in the right knee has been awakening her.  Has been taking tramadol  for the knee pain.  No new injuries.  Review of systems see HPI otherwise negative  Physical exam: General Well-developed well-nourished female in no acute distress.  Bilateral knees good range of motion.  No abnormal warmth erythema.  No instability valgus varus stressing of either knee.  Slight ecchymosis proximal tib-fib on the right.  Well-healed surgical incision left knee.  Tenderness over the left Pes anserine this region.  Radiographs: Right knee 2 views shows tricompartmental arthritis with significant narrowing medial lateral joint line.  Severe patellofemoral arthritic changes.  Meniscus calcification medially and laterally.  Knee is well located.  No acute fractures.  Impression: Right knee osteoarthritis  Plan: Offered the patient right knee injection and/or left knee pes anserinus injection she deferred.  She knows it has been long enough to have these to do 2 injections and she can call our office at any time if she wants to undergo cortisone injections.  Discussed with her hamstring stretching.  Questions were encouraged and answered

## 2024-01-12 NOTE — Telephone Encounter (Signed)
 Will confirm with the preop APP if the pt is going to need an appt as well as the lab work ordered.

## 2024-01-13 NOTE — Telephone Encounter (Signed)
 I called pt per Dr. Berry Bristol to see if the pt can labs done today. See notes from Dr. Berry Bristol. I left message for pt to call back to preop to discuss further.

## 2024-01-13 NOTE — Telephone Encounter (Signed)
 If she can have her blood work today which her surgeons always do prior to surgery, I may be able to clear her for surgery on Monday

## 2024-01-13 NOTE — Telephone Encounter (Signed)
 Pt never called back to d/w lab work maybe today. It is to late in the day at this time in order to be able to get results back in time.   Pt has hr appt with Dr. Berry Bristol 01/17/24, labs can be done at appt in office.

## 2024-01-14 ENCOUNTER — Other Ambulatory Visit: Payer: Self-pay | Admitting: Cardiology

## 2024-01-14 DIAGNOSIS — I10 Essential (primary) hypertension: Secondary | ICD-10-CM

## 2024-01-17 ENCOUNTER — Ambulatory Visit: Payer: Medicare Other | Admitting: Cardiology

## 2024-01-20 ENCOUNTER — Telehealth: Payer: Self-pay | Admitting: Cardiology

## 2024-01-20 DIAGNOSIS — I25118 Atherosclerotic heart disease of native coronary artery with other forms of angina pectoris: Secondary | ICD-10-CM

## 2024-01-20 DIAGNOSIS — E7800 Pure hypercholesterolemia, unspecified: Secondary | ICD-10-CM

## 2024-01-20 MED ORDER — PRALUENT 75 MG/ML ~~LOC~~ SOAJ: 75.0000 mg | SUBCUTANEOUS | 3 refills | Status: DC

## 2024-01-20 NOTE — Telephone Encounter (Signed)
*  STAT* If patient is at the pharmacy, call can be transferred to refill team.   1. Which medications need to be refilled? (please list name of each medication and dose if known)   Alirocumab (PRALUENT) 75 MG/ML SOAJ    2. Would you like to learn more about the convenience, safety, & potential cost savings by using the [facility]?   3. Are you open to using the [facility] (Type [facility]. ).  4. Which pharmacy/location (including street and city if local pharmacy) is medication to be sent to?  [id number] - [place], [place] - [address] AT [facility] [address]   5. Do they need a 30 day or 90 day supply?   30 day  Patient stated she is completely out of this medication.

## 2024-01-30 ENCOUNTER — Ambulatory Visit: Payer: Medicare Other | Admitting: Physician Assistant

## 2024-01-30 ENCOUNTER — Encounter: Payer: Self-pay | Admitting: Physician Assistant

## 2024-01-30 DIAGNOSIS — M1711 Unilateral primary osteoarthritis, right knee: Secondary | ICD-10-CM

## 2024-01-30 MED ORDER — METHYLPREDNISOLONE ACETATE 40 MG/ML IJ SUSP
40.0000 mg | INTRAMUSCULAR | Status: AC | PRN
  Administered 2024-01-30: 40 mg via INTRA_ARTICULAR

## 2024-01-30 MED ORDER — LIDOCAINE HCL 1 % IJ SOLN
3.0000 mL | INTRAMUSCULAR | Status: AC | PRN
  Administered 2024-01-30: 3 mL

## 2024-01-30 NOTE — Telephone Encounter (Signed)
 I will update all parties involved pt has appt in office with Dr. Jacinto Halim 01/31/24. Per pharm-d; see previous notes the pt is going to need a BMP due to blood thinner.

## 2024-01-30 NOTE — Progress Notes (Signed)
   Procedure Note  Patient: Erica Frank             Date of Birth: 1941-12-02           MRN: [id number]             Visit Date: 01/30/2024  HPI: Erica Frank comes in today requesting a cortisone injection in her right knee.  She states that last Thursday her knee pain returned.  She has had no new injury.  Most the pain is medial aspect of the knee.  Negative for fevers chills.  Right knee: Tenderness over the medial joint line pes anserinus.  Overall good range of motion no abnormal warmth erythema or effusion.  Procedures: Visit Diagnoses:  1. Primary osteoarthritis of right knee     Large Joint Inj: R knee on 01/30/2024 4:03 PM Indications: pain Details: 22 G 1.5 in needle, anterolateral approach  Arthrogram: No  Medications: 3 mL lidocaine 1 %; 40 mg methylPREDNISolone acetate 40 MG/ML Outcome: tolerated well, no immediate complications Procedure, treatment alternatives, risks and benefits explained, specific risks discussed. Consent was given by the patient. Immediately prior to procedure a time out was called to verify the correct patient, procedure, equipment, support staff and site/side marked as required. Patient was prepped and draped in the usual sterile fashion.    Plan: She will follow-up as needed.  She tolerated the cortisone injection well today.

## 2024-01-31 ENCOUNTER — Ambulatory Visit: Payer: Medicare Other | Attending: Cardiology | Admitting: Cardiology

## 2024-01-31 ENCOUNTER — Encounter: Payer: Self-pay | Admitting: Cardiology

## 2024-01-31 VITALS — BP 130/78 | HR 61 | Resp 16 | Ht 61.0 in | Wt 130.6 lb

## 2024-01-31 DIAGNOSIS — I480 Paroxysmal atrial fibrillation: Secondary | ICD-10-CM | POA: Diagnosis not present

## 2024-01-31 DIAGNOSIS — I25118 Atherosclerotic heart disease of native coronary artery with other forms of angina pectoris: Secondary | ICD-10-CM | POA: Diagnosis not present

## 2024-01-31 DIAGNOSIS — E7800 Pure hypercholesterolemia, unspecified: Secondary | ICD-10-CM | POA: Diagnosis not present

## 2024-01-31 NOTE — Patient Instructions (Addendum)
 Medication Instructions:  Your physician recommends that you continue on your current medications as directed. Please refer to the Current Medication list given to you today.  *If you need a refill on your cardiac medications before your next appointment, please call your pharmacy*   Lab Work: Have lab work checked at [employer] and [facility] If you have labs (blood work) drawn today and your tests are completely normal, you will receive your results only by: MyChart Message (if you have MyChart) OR A paper copy in the mail If you have any lab test that is abnormal or we need to change your treatment, we will call you to review the results.   Testing/Procedures: none   Follow-Up: At [facility], you and your health needs are our priority.  As part of our continuing mission to provide you with exceptional heart care, we have created designated Provider Care Teams.  These Care Teams include your primary Cardiologist (physician) and Advanced Practice Providers (APPs -  Physician Assistants and Nurse Practitioners) who all work together to provide you with the care you need, when you need it.  We recommend signing up for the patient portal called "MyChart".  Sign up information is provided on this After Visit Summary.  MyChart is used to connect with patients for Virtual Visits (Telemedicine).  Patients are able to view lab/test results, encounter notes, upcoming appointments, etc.  Non-urgent messages can be sent to your provider as well.   To learn more about what you can do with MyChart, go to [email or web address].    Your next appointment:   12 month(s)  Provider:   Yates Decamp, MD     Other Instructions

## 2024-01-31 NOTE — Progress Notes (Signed)
 Cardiology Office Note:  .   Date:  01/31/2024  ID:  Erica Frank, DOB Dec 04, 1941, MRN [id number] PCP: Chilton Greathouse, MD  [facility] Providers Cardiologist:  Yates Decamp, MD   History of Present Illness: .   Erica Frank is a 83 y.o. Caucasian female with hypertension, hyperlipidemia, asymptomatic chronic sinus bradycardia, and one episode of atrial fibrillation on 01/25/2019 S/P cardioversion in the ED, CAD and balloon angioplasty in 1989 and 1990, NSTEMI on 07/09/2019 S/P stenting to the mid LAD. She did not tolerate Repatha due to severe myalgias and arthralgias, this was switched over to Praluent which she is tolerating and has had no problems with this. She is also on Vytorin.   This is a 54-month office visit, she has been scheduled for spinal fusion surgery and needs preoperative cardiac workup. However with exercise, her symptoms have improved and she decided not to go through surgery or spinal injections.   Discussed the use of AI scribe software for clinical note transcription with the patient, who gave verbal consent to proceed.  History of Present Illness   The patient, with a history of hypertension, coronary artery disease, hypercholesterolemia, paroxysmal atrial fibrillation, presents with concerns about the cost of her medications, specifically Eliquis and Praluent. She reports spending about thirteen hundred dollars in the last two weeks on medications. She also expresses concern about the impact of multiple medications on her liver function.  Otherwise no chest pain, dyspnea, palpitations.  The patient also reports knee pain, which has been exacerbated by exercise. She received a shot for the knee pain, which has provided some relief. She also mentions back pain, which she manages with daily stretching exercises.      Labs   Lab Results  Component Value Date   CHOL 100 11/05/2021   HDL 57 11/05/2021   LDLCALC 22 11/05/2021   TRIG 122 11/05/2021    CHOLHDL 1.8 11/05/2021   Lab Results  Component Value Date   NA 134 (L) 06/05/2022   K 4.0 06/05/2022   CO2 25 06/05/2022   GLUCOSE 97 06/05/2022   BUN 19 06/05/2022   CREATININE 0.83 06/05/2022   CALCIUM 9.8 06/05/2022   GFRNONAA >60 06/05/2022      Latest Ref Rng & Units 06/05/2022    7:16 PM 05/14/2022    4:59 AM 05/06/2022    2:00 PM  BMP  Glucose 70 - 99 mg/dL 97  409  97   BUN 8 - 23 mg/dL 19  6  17    Creatinine 0.44 - 1.00 mg/dL 8.11  9.14  7.82   Sodium 135 - 145 mmol/L 134  137  139   Potassium 3.5 - 5.1 mmol/L 4.0  4.6  4.5   Chloride 98 - 111 mmol/L 99  103  105   CO2 22 - 32 mmol/L 25  30  26    Calcium 8.9 - 10.3 mg/dL 9.8  8.9  8.9       Latest Ref Rng & Units 06/05/2022    7:16 PM 05/14/2022    4:59 AM 05/06/2022    2:00 PM  CBC  WBC 4.0 - 10.5 K/uL 6.1  8.2  7.4   Hemoglobin 12.0 - 15.0 g/dL 95.6  21.3  08.6   Hematocrit 36.0 - 46.0 % 38.8  41.2  42.9   Platelets 150 - 400 K/uL 357  256  255    No results found for: "HGBA1C"  Lab Results  Component Value Date   TSH  0.899 01/17/2020    External Labs:  [facility] labs from PCP 03/09/2023:  Total cholesterol 107, triglycerides 59, HDL 62, LDL 33.  TSH normal at 1.480.  Review of Systems  Cardiovascular:  Negative for chest pain, dyspnea on exertion and leg swelling.   Physical Exam:   VS:  BP 130/78 (BP Location: Left Arm, Patient Position: Sitting, Cuff Size: Normal)   Pulse 61   Resp 16   Ht 5\' 1"  (1.549 m)   Wt 130 lb 9.6 oz (59.2 kg)   SpO2 96%   BMI 24.68 kg/m    Wt Readings from Last 3 Encounters:  01/31/24 130 lb 9.6 oz (59.2 kg)  01/12/24 127 lb (57.6 kg)  06/27/23 129 lb 9.6 oz (58.8 kg)     Physical Exam Neck:     Vascular: No carotid bruit or JVD.  Cardiovascular:     Rate and Rhythm: Normal rate and regular rhythm.     Pulses: Intact distal pulses.     Heart sounds: Normal heart sounds. No murmur heard.    No gallop.  Pulmonary:     Effort: Pulmonary effort is normal.     Breath  sounds: Normal breath sounds.  Abdominal:     General: Bowel sounds are normal.     Palpations: Abdomen is soft.  Musculoskeletal:     Right lower leg: No edema.     Left lower leg: No edema.    Studies Reviewed: Marland Kitchen     EKG:    EKG 06/27/2023: Marked sinus bradycardia at rate of 47 bpm otherwise normal EKG.   Medications and allergies    Allergies  Allergen Reactions   Hydrocodone     "Makes her feel crazy" Depressed   Repatha [Evolocumab] Diarrhea     Current Outpatient Medications:    acetaminophen (TYLENOL) 500 MG tablet, Take 1,000 mg by mouth daily as needed (pain)., Disp: , Rfl:    Alirocumab (PRALUENT) 75 MG/ML SOAJ, Inject 1 mL (75 mg total) into the skin every 14 (fourteen) days., Disp: 6 mL, Rfl: 3   allopurinol (ZYLOPRIM) 100 MG tablet, Take 100 mg by mouth daily., Disp: , Rfl:    apixaban (ELIQUIS) 2.5 MG TABS tablet, Take 1 tablet (2.5 mg total) by mouth 2 (two) times daily. Dose decrease, Disp: 28 tablet, Rfl:    BIOTIN PO, Take 1 tablet by mouth daily. Hair, skin & nails, Disp: , Rfl:    ezetimibe-simvastatin (VYTORIN) 10-40 MG tablet, TAKE 1 TABLET BY MOUTH DAILY, Disp: 90 tablet, Rfl: 1   isosorbide mononitrate (IMDUR) 60 MG 24 hr tablet, TAKE 1 TABLET(60 MG) BY MOUTH DAILY, Disp: 90 tablet, Rfl: 1   lisinopril (ZESTRIL) 5 MG tablet, TAKE 1 TABLET(5 MG) BY MOUTH DAILY, Disp: 90 tablet, Rfl: 1   nitroGLYCERIN (NITROSTAT) 0.4 MG SL tablet, Place 1 tablet (0.4 mg total) under the tongue every 5 (five) minutes as needed for chest pain., Disp: 25 tablet, Rfl: 2   Polyvinyl Alcohol-Povidone PF (REFRESH) 1.4-0.6 % SOLN, Place 1 drop into both eyes daily as needed (Dry eyes)., Disp: , Rfl:    traMADol (ULTRAM) 50 MG tablet, Take 100 mg by mouth daily., Disp: , Rfl:    traZODone (DESYREL) 50 MG tablet, Take 50 mg by mouth at bedtime as needed., Disp: , Rfl:    MYRBETRIQ 25 MG TB24 tablet, Take 25 mg by mouth daily. (Patient not taking: Reported on 01/31/2024), Disp: , Rfl:     ASSESSMENT AND PLAN: .  ICD-10-CM   1. Coronary artery disease of native artery of native heart with stable angina pectoris (HCC)  I25.118 CBC    Comp Met ([facility])    2. Paroxysmal atrial fibrillation (HCC)  I48.0 CBC    Comp Met ([facility])    3. Pure hypercholesterolemia  E78.00 CBC    Comp Met ([facility])       Assessment and Plan 1. Coronary artery disease of native artery of native heart with stable angina pectoris (HCC) (Primary) Patient has not had any recurrence of angina pectoris since her risk factors are well-controlled including lipids and hypertension.  Presently on Vytorin 10/40 along with Praluent.  Lipids reviewed, under excellent control.  She is also on Imdur 60 mg daily. - CBC - Comp Met ([facility])  2. Paroxysmal atrial fibrillation (HCC) Atrial fibrillation was remotely documented, has not had any recurrence but has high CHA2DS2-VASc rescore necessitating continued anticoagulation. - CBC - Comp Met ([facility])  3. Pure hypercholesterolemia Could not tolerate any other statin except Vytorin and also could not tolerate Repatha due to severe myalgias, presently on Praluent.  Cost is becoming a problem with Eliquis and also Praluent, will refer to pharmacy to evaluate. - CBC - Comp Met ([facility])  Other orders - MYRBETRIQ 25 MG TB24 tablet; Take 25 mg by mouth daily. (Patient not taking: Reported on 01/31/2024)  General Health Maintenance   Concerned about the cost of medications and potential impact on liver function. Discussed insurance coverage and deductible implications for medication costs. Order BMP and CBC to monitor kidney function and liver enzymes. Consult with a pharmacist regarding medication costs and insurance coverage. Follow up with Dr. Daleen Bo for lab results.  Follow-up   Contact pharmacist for medication cost consultation. Order BMP and CBC. Follow up in one year for routine check-up.           Signed,  Yates Decamp, MD, [facility] 01/31/2024, 8:38 PM [facility] [address] [place] [address] [place], [place] [address] Phone: [phone]. Fax:  [phone]

## 2024-02-01 LAB — COMPREHENSIVE METABOLIC PANEL
ALT: 14 [IU]/L (ref 0–32)
AST: 18 [IU]/L (ref 0–40)
Albumin: 4.4 g/dL (ref 3.7–4.7)
Alkaline Phosphatase: 98 [IU]/L (ref 44–121)
BUN/Creatinine Ratio: 29 — ABNORMAL HIGH (ref 12–28)
BUN: 25 mg/dL (ref 8–27)
Bilirubin Total: 0.2 mg/dL (ref 0.0–1.2)
CO2: 22 mmol/L (ref 20–29)
Calcium: 9.6 mg/dL (ref 8.7–10.3)
Chloride: 103 mmol/L (ref 96–106)
Creatinine, Ser: 0.85 mg/dL (ref 0.57–1.00)
Globulin, Total: 2.3 g/dL (ref 1.5–4.5)
Glucose: 104 mg/dL — ABNORMAL HIGH (ref 70–99)
Potassium: 4.8 mmol/L (ref 3.5–5.2)
Sodium: 140 mmol/L (ref 134–144)
Total Protein: 6.7 g/dL (ref 6.0–8.5)
eGFR: 68 mL/min/{1.73_m2} (ref >59)

## 2024-02-01 LAB — CBC
Hematocrit: 40.2 % (ref 34.0–46.6)
Hemoglobin: 14.1 g/dL (ref 11.1–15.9)
MCH: 33.7 pg — ABNORMAL HIGH (ref 26.6–33.0)
MCHC: 35.1 g/dL (ref 31.5–35.7)
MCV: 96 fL (ref 79–97)
Platelets: 318 10*3/uL (ref 150–450)
RBC: 4.18 x10E6/uL (ref 3.77–5.28)
RDW: 12.6 % (ref 11.7–15.4)
WBC: 13 10*3/uL — ABNORMAL HIGH (ref 3.4–10.8)

## 2024-02-02 ENCOUNTER — Telehealth: Payer: Self-pay | Admitting: Pharmacist

## 2024-02-02 NOTE — Telephone Encounter (Signed)
 Received message from Dr. Jacinto Halim stating that patient was spending about 1200 hours/month on her medications.  Patient has a $420 deductible.  Praluent is non formulary so her cost may be 100 $150 per month depending on which tier they place it on.  Eliquis should be $47 per month.  We could more than likely get her health will grant to help with the cost of Praluent. Her first fills this year might have been significantly high because of her deductible. I called patient and left a voicemail for her to call back

## 2024-02-05 ENCOUNTER — Encounter: Payer: Self-pay | Admitting: Cardiology

## 2024-02-05 NOTE — Progress Notes (Signed)
 CBC is normal with minimal elevation white count that is chronic, renal function is normal potassium levels are normal.  Labs are stable, continue anticoagulation with Eliquis.

## 2024-02-07 ENCOUNTER — Encounter: Payer: Self-pay | Admitting: Pharmacy Technician

## 2024-02-07 ENCOUNTER — Other Ambulatory Visit (HOSPITAL_COMMUNITY): Payer: Self-pay

## 2024-02-07 ENCOUNTER — Telehealth: Payer: Self-pay | Admitting: Pharmacy Technician

## 2024-02-07 NOTE — Telephone Encounter (Signed)
 Spoke with patient and reviewed the below info. She is interested in [employer]. States that if she isnt able to get grant, she would be willing to go back to Repatha, although she did have some GI issues with it. A tier exception may also be a possibility.  She will hit the 2000 out of pocket max fairly soon. Med assis please apply for [employer] grant for Praluent.

## 2024-02-07 NOTE — Telephone Encounter (Signed)
 Patient Advocate Encounter   The patient was approved for a [employer] grant that will help cover the cost of Praluent Total amount awarded, 2500.00.  Effective: 01/08/24 - 01/06/25   [id number] NFA:[id number] HQION:[id number] [id number] [employer] ID: [id number]   Pharmacy provided with approval and processing information. Patient informed via mychart

## 2024-02-24 ENCOUNTER — Telehealth: Payer: Self-pay | Admitting: Cardiology

## 2024-02-24 ENCOUNTER — Other Ambulatory Visit: Payer: Self-pay | Admitting: Cardiology

## 2024-02-24 DIAGNOSIS — E7800 Pure hypercholesterolemia, unspecified: Secondary | ICD-10-CM

## 2024-02-24 DIAGNOSIS — I25118 Atherosclerotic heart disease of native coronary artery with other forms of angina pectoris: Secondary | ICD-10-CM

## 2024-02-24 MED ORDER — PRALUENT 75 MG/ML ~~LOC~~ SOAJ: 75.0000 mg | SUBCUTANEOUS | 3 refills | Status: DC

## 2024-02-24 NOTE — Telephone Encounter (Signed)
 Pt's medication was already sent to pt's pharmacy as requested. Confirmation received.

## 2024-02-24 NOTE — Telephone Encounter (Signed)
*  STAT* If patient is at the pharmacy, call can be transferred to refill team.   1. Which medications need to be refilled? (please list name of each medication and dose if known)   Alirocumab (PRALUENT) 75 MG/ML SOAJ      4. Which pharmacy/location (including street and city if local pharmacy) is medication to be sent to? [id number] - [place], [place] - [address] AT [facility] OF [address]     5. Do they need a 30 day or 90 day supply? 90

## 2024-02-24 NOTE — Telephone Encounter (Signed)
*  STAT* If patient is at the pharmacy, call can be transferred to refill team.   1. Which medications need to be refilled? (please list name of each medication and dose if known) Ezetimibe-simvastatin   2. Would you like to learn more about the convenience, safety, & potential cost savings by using the [facility]?      3. Are you open to using the [facility] (Type [facility]..   4. Which pharmacy/location (including street and city if local pharmacy) is medication to be sent to? [facility] [address]y Kindle   5. Do they need a 30 day or 90 day supply? 90 days and refills

## 2024-03-14 ENCOUNTER — Telehealth: Payer: Self-pay | Admitting: Orthopaedic Surgery

## 2024-03-14 NOTE — Telephone Encounter (Signed)
 Patient found the xrays

## 2024-03-14 NOTE — Telephone Encounter (Signed)
 Patient called and said she needs a copy of the two xrays she had on her knees. CB#[phone]

## 2024-05-03 ENCOUNTER — Telehealth: Payer: Self-pay | Admitting: Cardiology

## 2024-05-03 NOTE — Telephone Encounter (Signed)
 Called pt's pharmacy and they stated that they have that medication and would refill for the pt. I tried to called that pt but did not get an answer.

## 2024-05-03 NOTE — Telephone Encounter (Signed)
 Please close encounter.. Thanks!

## 2024-05-03 NOTE — Telephone Encounter (Signed)
*  STAT* If patient is at the pharmacy, call can be transferred to refill team.   1. Which medications need to be refilled? (please list name of each medication and dose if known) Alirocumab  (PRALUENT ) 75 MG/ML SOAJ   2. Which pharmacy/location (including street and city if local pharmacy) is medication to be sent to? [id number] - , [place] - [address] AT [facility] OF [address] [phone]   3. Do they need a 30 day or 90 day supply? 90

## 2024-06-01 ENCOUNTER — Ambulatory Visit: Payer: Medicare Other | Admitting: Cardiology

## 2024-06-12 ENCOUNTER — Other Ambulatory Visit: Payer: Self-pay | Admitting: Cardiology

## 2024-06-12 ENCOUNTER — Other Ambulatory Visit: Payer: Medicare Other

## 2024-06-12 NOTE — Telephone Encounter (Signed)
 Prescription refill request for Eliquis  received. Indication: PAF Last office visit: 01/31/24  JINNY Bergamo MD Scr: 0.85 on 01/31/24  Epic Age: 83 Weight: 59.2kg  Based on above findings Eliquis  2.5mg  twice daily is the appropriate dose.  Refill approved.

## 2024-06-14 ENCOUNTER — Ambulatory Visit: Attending: Cardiology | Admitting: Cardiology

## 2024-06-14 ENCOUNTER — Encounter: Payer: Self-pay | Admitting: Cardiology

## 2024-06-14 VITALS — BP 94/58 | HR 76 | Resp 16 | Ht 61.0 in | Wt 126.8 lb

## 2024-06-14 DIAGNOSIS — I10 Essential (primary) hypertension: Secondary | ICD-10-CM

## 2024-06-14 DIAGNOSIS — R400 Somnolence: Secondary | ICD-10-CM

## 2024-06-14 DIAGNOSIS — I480 Paroxysmal atrial fibrillation: Secondary | ICD-10-CM

## 2024-06-14 DIAGNOSIS — I25118 Atherosclerotic heart disease of native coronary artery with other forms of angina pectoris: Secondary | ICD-10-CM

## 2024-06-14 DIAGNOSIS — I6523 Occlusion and stenosis of bilateral carotid arteries: Secondary | ICD-10-CM | POA: Diagnosis not present

## 2024-06-14 NOTE — Patient Instructions (Addendum)
 Medication Instructions:  Your physician recommends that you continue on your current medications as directed. Please refer to the Current Medication list given to you today.  *If you need a refill on your cardiac medications before your next appointment, please call your pharmacy*  Lab Work: Have lab work checked today in the lab on the first floor--Lipids If you have labs (blood work) drawn today and your tests are completely normal, you will receive your results only by: MyChart Message (if you have MyChart) OR A paper copy in the mail If you have any lab test that is abnormal or we need to change your treatment, we will call you to review the results.  Testing/Procedures:  Your physician has requested that you have a carotid duplex. This test is an ultrasound of the carotid arteries in your neck. It looks at blood flow through these arteries that supply the brain with blood. Allow one hour for this exam. There are no restrictions or special instructions.  Dr Ladona has ordered an overnight pulse oximetry study  Follow-Up: At [facility], you and your health needs are our priority.  As part of our continuing mission to provide you with exceptional heart care, our providers are all part of one team.  This team includes your primary Cardiologist (physician) and Advanced Practice Providers or APPs (Physician Assistants and Nurse Practitioners) who all work together to provide you with the care you need, when you need it.  Your next appointment:   12 month(s)  Provider:   Gordy Ladona, MD    We recommend signing up for the patient portal called MyChart.  Sign up information is provided on this After Visit Summary.  MyChart is used to connect with patients for Virtual Visits (Telemedicine).  Patients are able to view lab/test results, encounter notes, upcoming appointments, etc.  Non-urgent messages can be sent to your provider as well.   To learn more about what you can do with  MyChart, go to [email or web address].   Other Instructions

## 2024-06-14 NOTE — Progress Notes (Signed)
 Cardiology Office Note:  .   Date:  06/14/2024  ID:  Erica Frank, DOB 04/07/1941, MRN [id number] PCP: Janey Santos, MD  [facility] Providers Cardiologist:  Gordy Bergamo, MD   History of Present Illness: .   Erica Frank is a 83 y.o. Caucasian female with hypertension, hyperlipidemia, asymptomatic chronic sinus bradycardia, and one episode of atrial fibrillation on 01/25/2019 S/P cardioversion in the ED, CAD and balloon angioplasty in 1989 and 1990, NSTEMI on 07/09/2019 S/P stenting to the mid LAD. She did not tolerate Repatha  due to severe myalgias and arthralgias, this was switched over to Praluent  which she is tolerating and has had no problems with this. She is also on Vytorin .  This is annual visit.  Discussed the use of AI scribe software for clinical note transcription with the patient, who gave verbal consent to proceed.  History of Present Illness Erica Frank is an 83 year old female with atrial fibrillation and coronary artery disease who presents with concerns about fatigue and prominent veins.  She experiences fatigue and prominent veins, which she associates with dehydration. Her mother also had prominent veins. She has atrial fibrillation, with a documented episode treated with cardioversion in February 2020, and is currently on Eliquis . She has coronary artery disease with a stent placed in the LAD in 2020. She denies chest pain. Her blood pressure is generally well controlled with lisinopril  and isosorbide  mononitrate, though home readings are higher than during visits. She recently stopped taking tramadol  for knee pain, which she believes may contribute to her fatigue. She takes trazodone  at night for sleep. No dizziness or orthostatic symptoms except when bending down and getting up.  Labs   Lab Results  Component Value Date   CHOL 100 11/05/2021   HDL 57 11/05/2021   LDLCALC 22 11/05/2021   TRIG 122 11/05/2021   CHOLHDL 1.8 11/05/2021   No  results found for: LIPOA  Lab Results  Component Value Date   NA 140 01/31/2024   K 4.8 01/31/2024   CO2 22 01/31/2024   GLUCOSE 104 (H) 01/31/2024   BUN 25 01/31/2024   CREATININE 0.85 01/31/2024   CALCIUM 9.6 01/31/2024   EGFR 68 01/31/2024   GFRNONAA >60 06/05/2022      Latest Ref Rng & Units 01/31/2024    5:00 PM 06/05/2022    7:16 PM 05/14/2022    4:59 AM  BMP  Glucose 70 - 99 mg/dL 895  97  880   BUN 8 - 27 mg/dL 25  19  6    Creatinine 0.57 - 1.00 mg/dL 9.14  9.16  9.20   BUN/Creat Ratio 12 - 28 29     Sodium 134 - 144 mmol/L 140  134  137   Potassium 3.5 - 5.2 mmol/L 4.8  4.0  4.6   Chloride 96 - 106 mmol/L 103  99  103   CO2 20 - 29 mmol/L 22  25  30    Calcium 8.7 - 10.3 mg/dL 9.6  9.8  8.9       Latest Ref Rng & Units 01/31/2024    5:00 PM 06/05/2022    7:16 PM 05/14/2022    4:59 AM  CBC  WBC 3.4 - 10.8 x10E3/uL 13.0  6.1  8.2   Hemoglobin 11.1 - 15.9 g/dL 85.8  87.3  86.2   Hematocrit 34.0 - 46.6 % 40.2  38.8  41.2   Platelets 150 - 450 x10E3/uL 318  357  256  No results found for: HGBA1C  Lab Results  Component Value Date   TSH 0.899 01/17/2020     ROS  Review of Systems  Constitutional: Positive for malaise/fatigue.  Cardiovascular:  Negative for chest pain, dyspnea on exertion and leg swelling.   Physical Exam:   VS:  BP (!) 94/58 (BP Location: Left Arm, Patient Position: Sitting, Cuff Size: Normal)   Pulse 76   Resp 16   Ht 5' 1 (1.549 m)   Wt 126 lb 12.8 oz (57.5 kg)   SpO2 94%   BMI 23.96 kg/m    Wt Readings from Last 3 Encounters:  06/14/24 126 lb 12.8 oz (57.5 kg)  01/31/24 130 lb 9.6 oz (59.2 kg)  01/12/24 127 lb (57.6 kg)    Physical Exam Neck:     Vascular: No carotid bruit or JVD.  Cardiovascular:     Rate and Rhythm: Normal rate and regular rhythm.     Pulses: Intact distal pulses.     Heart sounds: Normal heart sounds. No murmur heard.    No gallop.  Pulmonary:     Effort: Pulmonary effort is normal.     Breath sounds:  Normal breath sounds.  Abdominal:     General: Bowel sounds are normal.     Palpations: Abdomen is soft.  Musculoskeletal:     Right lower leg: No edema.     Left lower leg: No edema.    Studies Reviewed: SABRA    CARDIAC CATHETERIZATION 07/09/2019  STENT SYNERGY DES 3X24     Carotid artery duplex 06/14/2023:  Duplex suggests stenosis in the right internal carotid artery (16-49%). <50% stenosis in the right common carotid artery. <50% stenosis in the right external carotid artery.  Duplex suggests stenosis in the left internal carotid artery (16-49%). Duplex suggests <50% stenosis in the left external carotid artery.  There is diffuse heterogenous plaque noted in the bilateral common carotid artery and carotid bulb and homogenous plaque in the ICA.  Antegrade right vertebral artery flow. Antegrade left vertebral artery flow.  No significant change from 03/24/2022. Follow up in one year is appropriate if clinically indicated.    EKG:    EKG Interpretation Date/Time:  Thursday June 14 2024 15:07:33 EDT Ventricular Rate:  69 PR Interval:  134 QRS Duration:  66 QT Interval:  384 QTC Calculation: 411 R Axis:   -16  Text Interpretation: EKG 06/14/2024: Normal sinus rhythm at the rate of 69 bpm, normal EKG.  Compared to 06/06/2021, no significant change. Confirmed by Ernest Orr, Jagadeesh ([id number]) on 06/14/2024 3:39:41 PM    Medications ordered    No orders of the defined types were placed in this encounter.    ASSESSMENT AND PLAN: .      ICD-10-CM   1. Coronary artery disease of native artery of native heart with stable angina pectoris (HCC)  I25.118 EKG 12-Lead    2. Essential hypertension  I10     3. Paroxysmal atrial fibrillation (HCC)  I48.0     4. Asymptomatic bilateral carotid artery stenosis  I65.23      Click Here to Calculate/Change CHADS2VASc Score The patient's CHADS2-VASc score is 5, indicating a 7.2% annual risk of stroke.  Therefore, anticoagulation is recommended.    CHF History: No HTN History: Yes Diabetes History: No Stroke History: No Vascular Disease History: Yes  Assessment & Plan Paroxysmal atrial fibrillation One documented episode in February 2020, treated with cardioversion. No subsequent episodes. High risk of stroke with a CHADS-VASc score of 7%. -  Continue Eliquis  for stroke prevention.  Coronary artery disease with stent in LAD Stent placed in the LAD in 2020. Currently asymptomatic with no chest pain or signs of heart failure. Cholesterol is well controlled. - Check cholesterol levels today.  Hypertension Well controlled with current medication regimen. Blood pressure readings at home are generally stable, though occasionally low. No symptoms of dizziness or lightheadedness reported. - Continue lisinopril  5 mg once daily. - Continue isosorbide  mononitrate 60 mg once daily.  Asymptomatic bilateral carotid stenosis No audible bruits on examination. Scheduled for carotid artery duplex this month or next month. - Confirm schedule for carotid artery duplex scan.  Fatigue and malaise Persistent fatigue and malaise, possibly exacerbated by discontinuation of tramadol . Poor sleep quality noted. Potential contribution from low nocturnal oxygen levels. - Order nocturnal oximetry to assess oxygen levels during sleep.  Office visit in a year or sooner if problems.     Signed,  Gordy Bergamo, MD, [facility] 06/14/2024, 3:44 PM [facility] [address] [place], [place] [address] Phone: [phone]. Fax:  [phone]

## 2024-06-20 ENCOUNTER — Ambulatory Visit: Payer: Self-pay | Admitting: Cardiology

## 2024-06-20 ENCOUNTER — Ambulatory Visit (HOSPITAL_COMMUNITY)
Admission: RE | Admit: 2024-06-20 | Discharge: 2024-06-20 | Disposition: A | Discharge status: Home / Self Care | Source: Ambulatory Visit | Attending: Cardiology | Admitting: Cardiology

## 2024-06-20 DIAGNOSIS — I6523 Occlusion and stenosis of bilateral carotid arteries: Secondary | ICD-10-CM | POA: Insufficient documentation

## 2024-06-20 NOTE — Progress Notes (Signed)
 Carotid artery duplex 06/20/2024:  Minimal carotid artery disease bilateral 1 to 39% stenosis, no further evaluation is indicated. Bilateral subclavian revealed normal hemodynamics and bilateral vertebral artery demonstrate antegrade flow.

## 2024-06-22 LAB — LIPID PANEL
Chol/HDL Ratio: 1.7 ratio (ref 0.0–4.4)
Cholesterol, Total: 118 mg/dL (ref 100–199)
HDL: 69 mg/dL (ref >39)
LDL Chol Calc (NIH): 36 mg/dL (ref 0–99)
Triglycerides: 59 mg/dL (ref 0–149)
VLDL Cholesterol Cal: 13 mg/dL (ref 5–40)

## 2024-06-26 ENCOUNTER — Ambulatory Visit: Payer: Medicare Other | Admitting: Cardiology

## 2024-06-27 ENCOUNTER — Encounter (HOSPITAL_COMMUNITY)

## 2024-06-28 NOTE — Telephone Encounter (Signed)
 I spoke with patient and reviewed carotid doppler and lipid profile results with her

## 2024-07-12 ENCOUNTER — Other Ambulatory Visit: Payer: Self-pay | Admitting: Cardiology

## 2024-07-12 DIAGNOSIS — I10 Essential (primary) hypertension: Secondary | ICD-10-CM

## 2024-08-20 ENCOUNTER — Other Ambulatory Visit: Payer: Self-pay | Admitting: Cardiology

## 2024-08-20 DIAGNOSIS — R400 Somnolence: Secondary | ICD-10-CM

## 2024-08-20 NOTE — Progress Notes (Signed)
 ICD-10-CM   1. Uncontrolled daytime somnolence  R40.0 Overnight Pulse Oximetry Study     Orders Placed This Encounter  Procedures   Overnight Pulse Oximetry Study    [employer]

## 2024-08-27 ENCOUNTER — Other Ambulatory Visit: Payer: Self-pay | Admitting: Internal Medicine

## 2024-08-27 DIAGNOSIS — Z1231 Encounter for screening mammogram for malignant neoplasm of breast: Secondary | ICD-10-CM

## 2024-09-26 ENCOUNTER — Ambulatory Visit

## 2024-09-28 ENCOUNTER — Ambulatory Visit

## 2024-10-08 ENCOUNTER — Ambulatory Visit: Admitting: Physician Assistant

## 2024-10-08 ENCOUNTER — Other Ambulatory Visit (INDEPENDENT_AMBULATORY_CARE_PROVIDER_SITE_OTHER)

## 2024-10-08 ENCOUNTER — Telehealth: Payer: Self-pay | Admitting: Radiology

## 2024-10-08 ENCOUNTER — Encounter: Payer: Self-pay | Admitting: Radiology

## 2024-10-08 DIAGNOSIS — M1711 Unilateral primary osteoarthritis, right knee: Secondary | ICD-10-CM

## 2024-10-08 DIAGNOSIS — M7052 Other bursitis of knee, left knee: Secondary | ICD-10-CM

## 2024-10-08 DIAGNOSIS — Z96652 Presence of left artificial knee joint: Secondary | ICD-10-CM

## 2024-10-08 NOTE — Progress Notes (Signed)
 HPI: Erica Frank comes in today requesting cortisone injection right knee.  She is also having left knee pain which she has had since surgery.  She is given someone who initially had total knee replacement done elsewhere and then underwent synovectomy and poly exchange of the left knee.  She has had no new injury to either knee.  Last cortisone injection right knee was 01/30/2024.  Patient is nondiabetic.  Review of systems: Denies fevers chills.   Physical exam: General Well-developed well-nourished female who ambulates without any assistive device. Bilateral knees: No abnormal warmth erythema or effusion.  She has tenderness over the right knee globally.  Good range of motion of the right knee.  Good range of motion left knee.  Tenderness over the pes anserinus area.  Surgical incision left knee is well healed.   Impression: Right knee osteoarthritis History left total knee arthroplasty Left knee pes anserinus bursitis   Plan: Per her request she was given a right knee cortisone injection. In regards to the left knee recommend hamstring stretching.  She has had prior pes injections which gave her no significant relief.  Questions were encouraged and answered at length.  Follow-up with us  as needed.      Left knee 2 views: Knee is well located.  No acute fractures acute findings.  Status post left total knee arthroplasty well-seated components.  Right knee seen on the AP view and shows moderate to moderately severe narrowing medial lateral compartments with periarticular spurring.

## 2024-10-08 NOTE — Telephone Encounter (Signed)
 Patient was seen today in clinic, she forgot to ask if shock wave treatment would be helpful with her left knee. Please advise.   Hx LT TKA 10/07/20, 05/13/22 Synovectomy & left poly-liner exchange

## 2024-10-09 ENCOUNTER — Other Ambulatory Visit: Payer: Self-pay | Admitting: Radiology

## 2024-10-09 DIAGNOSIS — Z96652 Presence of left artificial knee joint: Secondary | ICD-10-CM

## 2024-10-09 NOTE — Telephone Encounter (Signed)
 Left voice mail

## 2024-10-17 ENCOUNTER — Ambulatory Visit
Admission: RE | Admit: 2024-10-17 | Discharge: 2024-10-17 | Disposition: A | Discharge status: Home / Self Care | Source: Ambulatory Visit | Attending: Internal Medicine | Admitting: Internal Medicine

## 2024-10-17 DIAGNOSIS — Z1231 Encounter for screening mammogram for malignant neoplasm of breast: Secondary | ICD-10-CM

## 2024-10-18 ENCOUNTER — Encounter: Payer: Self-pay | Admitting: Sports Medicine

## 2024-10-18 ENCOUNTER — Ambulatory Visit: Admitting: Sports Medicine

## 2024-10-18 DIAGNOSIS — M1711 Unilateral primary osteoarthritis, right knee: Secondary | ICD-10-CM | POA: Diagnosis not present

## 2024-10-18 DIAGNOSIS — M25562 Pain in left knee: Secondary | ICD-10-CM | POA: Diagnosis not present

## 2024-10-18 DIAGNOSIS — Z96652 Presence of left artificial knee joint: Secondary | ICD-10-CM | POA: Diagnosis not present

## 2024-10-18 DIAGNOSIS — G8929 Other chronic pain: Secondary | ICD-10-CM

## 2024-10-18 DIAGNOSIS — G5793 Unspecified mononeuropathy of bilateral lower limbs: Secondary | ICD-10-CM

## 2024-10-18 NOTE — Progress Notes (Signed)
 Patient says that she has had left knee pain since she had it replaced. She has seen several providers and received several treatments for her knee pain, including recent injections into the pes anserine area. She has had a couple of shockwave treatments through her chiropractor, and would like to continue these shockwave treatments here. She denies any soreness, tenderness, or relief after those initial treatments. She currently takes Tramadol  for her pain, uses ice, and does her exercises from PT. She does have tingling and burning in the lower legs at night.

## 2024-10-18 NOTE — Progress Notes (Signed)
 Erica Frank [place] - 83 y.o. female MRN [id number]  Date of birth: 05-Dec-1941  Office Visit Note: Visit Date: 10/18/2024 PCP: Janey Santos, MD Referred by: Janey Santos, MD  Subjective: Chief Complaint  Patient presents with   Left Knee - Pain   HPI: Erica Frank is a pleasant 83 y.o. female who presents today for chronic and persistent left knee pain in the setting of previous TKA with revision.  She has a notable surgical orthopedic history of total knee arthroplasty performed by Dr. Ernie in 2021 followed up by total knee revision by Dr. Vernetta on 05/13/2022.  She is currently involved in formalized physical therapy, has done this extensively over the years.  Ever since her knee has been replaced she has had pain and never felt like it improved.  She did have mild improvement after her second revision surgery.  She has trialed many different treatments including injections into the pes anserine region.  She has had shockwave treatments in the past through her chiropractor but only briefly.  She is interested in retrying this.  She does use tramadol  100 mg a.m. as well as usually 50 mg in the p.m. which does help her pain.  She also uses ice and is doing both home exercise and her physical therapy.  She also notices burning and tingling sensation in the lower legs bilaterally, this is really only at nighttime.   She does have a notable history of low back pain with previous lumbar laminectomy and decompression microdiscectomy.  Pertinent ROS were reviewed with the patient and found to be negative unless otherwise specified above in HPI.   Assessment & Plan: Visit Diagnoses:  1. Chronic pain of left knee   2. Hx of total knee arthroplasty, left   3. Neuropathy involving both lower extremities   4. Primary osteoarthritis of right knee    Plan: Impression is chronic left knee pain status post TKA in 2021 followed by knee revision in 2023.  She has had ongoing pain since this  time and restriction in range of motion despite multiple different treatments, other providers as well as pes anserine injections, PT and oral medications.  She had trialed shockwave therapy briefly with a chiropractor in the past, she would like to proceed with this to help break up some of the scar tissue and see if this can provide her some functional pain relief.  We did perform our first trial of extracorporeal shockwave therapy for the anterior and posterior knee today, patient tolerated well.  For her pain control, she may continue her tramadol  100 mg a.m., 50-100 mg nightly as needed.  I did discuss continuing PT and working on range of motion and mobility about the knee and attempt to help improve pain and functionality.  She does have burning and tingling in the lower legs, discussed possible etiology of this being neurogenic neuropathy given her soft tissue swelling in the legs, cannot rule out lumbar radiculopathy but given her symptomatology at nighttime is less likely.  We discussed starting with gentle compression knee-high socks at nighttime as well as supplementing with vitamin B6 or vitamin B complex to help support nerve health.  She will make these changes on her own.  I will see her back next week for repeat shockwave treatment.  After 2 trials we will see if she is proceeding any cumulative benefit from this, if so we will bring her back for shockwave treatments only.  Follow-up next week.  Follow-up: Return in about  1 week (around 10/25/2024) for about 1-week for Left knee (reg visit).   Meds & Orders: No orders of the defined types were placed in this encounter.  No orders of the defined types were placed in this encounter.    Procedures: Procedure: ECSWT Indications:  Scar tissue, Knee Pain s/p TKA   Procedure Details Consent: Risks of procedure as well as the alternatives and risks of each were explained to the patient.  Verbal consent for procedure obtained. Time Out:  Verified patient identification, verified procedure, site was marked, verified correct patient position. The area was cleaned with alcohol  swab.     The Left Knee (soft tissue/scar tissue)  was targeted for Extracorporeal shockwave therapy.    Preset: Muscle/Scar Tissue Power Level: 70-90 mJ Frequency: 7-10 Hz Impulse/cycles: 2000 anteriorly; 750 posteriorly Head size: Regular   Patient tolerated procedure well without immediate complications.       Clinical History: No specialty comments available.  She reports that she has never smoked. She has never used smokeless tobacco. No results for input(s): HGBA1C, LABURIC in the last 8760 hours.  Objective:    Physical Exam  Gen: Well-appearing, in no acute distress; non-toxic CV: Well-perfused. Warm.  Resp: Breathing unlabored on room air; no wheezing. Psych: Fluid speech in conversation; appropriate affect; normal thought process  Ortho Exam - Left knee: There is a well-healed incision from previous TKA as well as revision.  There is trace effusion about the left knee.  Range of motion is limited from about 2-95 degrees with pain at endrange flexion.  There is some fascial restriction of the superior aspect of the TKA incision.  No redness or warmth of the knee joint.  - Bilateral legs: There is trace right leg and 1+ left leg pitting edema from the calf down to the foot.  Cap refill less than 2 seconds.  There are bilateral lower extremity telangiectasias noted.  Imaging:  *I did review left knee and AP bilateral knee x-rays from 10/08/2024 during the visit today. Agree with below findings.  10/08/24: Left knee 2 views: Knee is well located.  No acute fractures acute  findings.  Status post left total knee arthroplasty well-seated  components.  Right knee seen on the AP view and shows moderate to  moderately severe narrowing medial lateral compartments with periarticular  spurring.   Past Medical/Family/Surgical/Social  History: Medications & Allergies reviewed per EMR, new medications updated. Patient Active Problem List   Diagnosis Date Noted   Altered bowel function 12/19/2023   Anal fissure 12/19/2023   Bilateral carotid artery stenosis 12/19/2023   Diverticulitis 12/19/2023   Generalized abdominal pain 12/19/2023   Blood per rectum 12/19/2023   History of colonic polyps 12/19/2023   Anticoagulated 12/19/2023   Nausea 12/19/2023   Acute pain of left knee 12/19/2023   Pinched nerve in neck 12/19/2023   Positive fecal immunochemical test 12/19/2023   Status post revision of total replacement of left knee 05/13/2022   Trigger middle finger of left hand 03/05/2022   Degenerative lumbar spinal stenosis 02/02/2022   Ischial bursitis 12/21/2021   Pain of left hip joint 12/21/2021   Swelling of right foot 08/27/2021   S/P left TKA 10/07/2020   Left knee OA 10/07/2020   Acquired trigger finger of left middle finger 04/22/2020   Elevated blood-pressure reading, without diagnosis of hypertension 03/21/2020   Syncope 01/16/2020   Anemia 01/16/2020   Thrombocytopenia 01/16/2020   Stool color abnormal 12/04/2019   Gout 11/28/2019   Cervical  myelopathy (HCC) 11/23/2019   Neck pain 11/09/2019   Hypertensive heart disease without congestive heart failure 08/02/2019   Residual hemorrhoidal skin tags 08/02/2019   Fatigue 06/21/2019   Paroxysmal atrial fibrillation (HCC) 02/15/2019   Mixed hyperlipidemia 02/15/2019   Bradycardia 02/15/2019   Coronary artery disease involving native coronary artery of native heart with unstable angina pectoris (HCC) 12/20/2018   Dyslipidemia 12/20/2018   Essential hypertension 12/20/2018   Affective psychosis 09/20/2016   Benign neoplasm of colon 09/20/2016   Overactive bladder 09/09/2015   Encounter for general adult medical examination without abnormal findings 09/03/2015   Idiopathic scoliosis and kyphoscoliosis 05/28/2015   Low back pain 05/28/2015   Insomnia  11/24/2009   Acute stress reaction 11/21/2009   Gastro-esophageal reflux disease without esophagitis 11/21/2009   Past Medical History:  Diagnosis Date   Anginal pain 1989   Anxiety    Arthritis    Atherosclerosis    Atrial fibrillation (HCC)    Coronary artery disease    Dysrhythmia    a-fib   Elevated liver enzymes    GERD (gastroesophageal reflux disease)    Heart attack (HCC) 2020   Heart disease    High cholesterol    Hypertension    NSTEMI (non-ST elevated myocardial infarction) (HCC) 07/09/2019   Family History  Problem Relation Age of Onset   Uterine cancer Mother    Hypertension Brother    Heart disease Brother    Breast cancer Cousin    Past Surgical History:  Procedure Laterality Date   ABDOMINAL HYSTERECTOMY  1983   for endometriosis with Burch   ANGIOPLASTY     ANGIOPLASTY     BACK SURGERY     1990   CARDIAC SURGERY     Catheterization   CORONARY STENT INTERVENTION N/A 07/09/2019   Procedure: CORONARY STENT INTERVENTION;  Surgeon: Ladona Heinz, MD;  Location: [facility];  Service: Cardiovascular;  Laterality: N/A;   LEFT HEART CATH AND CORONARY ANGIOGRAPHY N/A 07/09/2019   Procedure: LEFT HEART CATH AND CORONARY ANGIOGRAPHY and possible intervention;  Surgeon: Ladona Heinz, MD;  Location: [facility];  Service: Cardiovascular;  Laterality: N/A;  4: or 4:30 PM today   LUMBAR LAMINECTOMY/DECOMPRESSION MICRODISCECTOMY Left 02/02/2022   Procedure: Left Lumbar two-three, Lumbar five-Sacral one Sublaminar decompression;  Surgeon: Colon Shove, MD;  Location: [facility] OR;  Service: Neurosurgery;  Laterality: Left;   NECK SURGERY     TOTAL KNEE ARTHROPLASTY Left 10/07/2020   Procedure: TOTAL KNEE ARTHROPLASTY;  Surgeon: Ernie Cough, MD;  Location: [facility];  Service: Orthopedics;  Laterality: Left;  70 mins   TOTAL KNEE REVISION Left 05/13/2022   Procedure: SYNOVECTOMY AND LEFT KNEE POLY-LINER EXCHANGE;  Surgeon: Vernetta Lonni GRADE, MD;  Location: [facility] OR;   Service: Orthopedics;  Laterality: Left;   Social History   Occupational History   Not on file  Tobacco Use   Smoking status: Never   Smokeless tobacco: Never  Vaping Use   Vaping status: Never Used  Substance and Sexual Activity   Alcohol  use: Not Currently    Alcohol /week: 3.0 standard drinks of alcohol     Types: 3 Glasses of wine per week    Comment: I havent in several months 05/06/22   Drug use: No   Sexual activity: Not Currently    Birth control/protection: Post-menopausal    Comment: 1st intercourse 2 yo-1 partners

## 2024-10-25 ENCOUNTER — Other Ambulatory Visit: Payer: Self-pay | Admitting: Cardiology

## 2024-10-25 DIAGNOSIS — I480 Paroxysmal atrial fibrillation: Secondary | ICD-10-CM

## 2024-10-26 ENCOUNTER — Encounter: Payer: Self-pay | Admitting: Sports Medicine

## 2024-10-26 ENCOUNTER — Telehealth: Payer: Self-pay | Admitting: Cardiology

## 2024-10-26 ENCOUNTER — Ambulatory Visit: Admitting: Sports Medicine

## 2024-10-26 DIAGNOSIS — L905 Scar conditions and fibrosis of skin: Secondary | ICD-10-CM

## 2024-10-26 DIAGNOSIS — Z96652 Presence of left artificial knee joint: Secondary | ICD-10-CM

## 2024-10-26 DIAGNOSIS — M25562 Pain in left knee: Secondary | ICD-10-CM

## 2024-10-26 DIAGNOSIS — G8929 Other chronic pain: Secondary | ICD-10-CM

## 2024-10-26 NOTE — Progress Notes (Signed)
 Erica Frank - 83 y.o. female MRN [id number]  Date of birth: 05/05/1941  Office Visit Note: Visit Date: 10/26/2024 PCP: Erica Santos, MD Referred by: Erica Santos, MD  Subjective: Chief Complaint  Patient presents with   Left Knee - Follow-up   HPI: Erica Frank is a pleasant 83 y.o. female who presents today for follow-up of left knee pain s/p TKA.  She has a notable surgical orthopedic history of total knee arthroplasty performed by Dr. Ernie in 2021 followed up by total knee revision by Dr. Vernetta on 05/13/2022.  She is currently involved in formalized physical therapy, has done this extensively over the years.   Mark first trial of extracorporeal shockwave therapy.  She does feel like she noticed some of her pain and how she is using tramadol  for her knee, although like to stop this for a few days to see what degree of  Pertinent ROS were reviewed with the patient and found to be negative unless otherwise specified above in HPI.   Assessment & Plan: Visit Diagnoses:  1. Hx of total knee arthroplasty, left   2. Chronic pain of left knee   3. Scar tissue    Plan: Impression is chronic left knee pain status post TKA in 2021 followed by knee revision in 2023.  She did notice some benefit after our first trial of extracorporeal shockwave therapy.  We did repeat this today, patient tolerated well.  We discussed the pathophysiology of this and how this would be helping to break up some of her scar tissue from her surgery as well as revision and work on hopefully improving range of motion and function about the knee joint.  She is very interested in proceeding with this to see after a few sessions what sort of cumulative benefit she receives.  We will schedule her for shockwave only visits going forward and take this on a weekly basis.  She will continue her PT and her home exercise regimen.  She will continue her tramadol  100 mg a.m., 50-100 mg nightly as needed.    Follow-up: Return in about 12 days (around 11/07/2024) for make appt 1-2 weeks for Shockwave only ($70, no co-pay).   Meds & Orders: No orders of the defined types were placed in this encounter.  No orders of the defined types were placed in this encounter.    Procedures: Procedure: ECSWT Indications:  Scar tissue, Knee Pain s/p TKA   Procedure Details Consent: Risks of procedure as well as the alternatives and risks of each were explained to the patient.  Verbal consent for procedure obtained. Time Out: Verified patient identification, verified procedure, site was marked, verified correct patient position. The area was cleaned with alcohol  swab.     The Left Knee (soft tissue/scar tissue)  was targeted for Extracorporeal shockwave therapy.    Preset: Muscle/Scar Tissue Power Level: 90-110 mJ Frequency: 9-12Hz    Impulse/cycles: 2000 anteriorly; 800 posteriorly Head size: Regular   Patient tolerated procedure well without immediate complications.      Clinical History: No specialty comments available.  She reports that she has never smoked. She has never used smokeless tobacco. No results for input(s): HGBA1C, LABURIC in the last 8760 hours.  Objective:     Physical Exam  Gen: Well-appearing, in no acute distress; non-toxic CV: Well-perfused. Warm.  Resp: Breathing unlabored on room air; no wheezing. Psych: Fluid speech in conversation; appropriate affect; normal thought process  Ortho Exam - Left knee: There is well-healed incision from  previous TKA as well as revision.  No significant effusion about the knee joint.  There is some myofascial restriction with possible scar tissue over the superior aspect of the patella in the suprapatellar pouch.  Imaging: No results found.  Past Medical/Family/Surgical/Social History: Medications & Allergies reviewed per EMR, new medications updated. Patient Active Problem List   Diagnosis Date Noted   Altered bowel function  12/19/2023   Anal fissure 12/19/2023   Bilateral carotid artery stenosis 12/19/2023   Diverticulitis 12/19/2023   Generalized abdominal pain 12/19/2023   Blood per rectum 12/19/2023   History of colonic polyps 12/19/2023   Anticoagulated 12/19/2023   Nausea 12/19/2023   Acute pain of left knee 12/19/2023   Pinched nerve in neck 12/19/2023   Positive fecal immunochemical test 12/19/2023   Status post revision of total replacement of left knee 05/13/2022   Trigger middle finger of left hand 03/05/2022   Degenerative lumbar spinal stenosis 02/02/2022   Ischial bursitis 12/21/2021   Pain of left hip joint 12/21/2021   Swelling of right foot 08/27/2021   S/P left TKA 10/07/2020   Left knee OA 10/07/2020   Acquired trigger finger of left middle finger 04/22/2020   Elevated blood-pressure reading, without diagnosis of hypertension 03/21/2020   Syncope 01/16/2020   Anemia 01/16/2020   Thrombocytopenia 01/16/2020   Stool color abnormal 12/04/2019   Gout 11/28/2019   Cervical myelopathy (HCC) 11/23/2019   Neck pain 11/09/2019   Hypertensive heart disease without congestive heart failure 08/02/2019   Residual hemorrhoidal skin tags 08/02/2019   Fatigue 06/21/2019   Paroxysmal atrial fibrillation (HCC) 02/15/2019   Mixed hyperlipidemia 02/15/2019   Bradycardia 02/15/2019   Coronary artery disease involving native coronary artery of native heart with unstable angina pectoris (HCC) 12/20/2018   Dyslipidemia 12/20/2018   Essential hypertension 12/20/2018   Affective psychosis 09/20/2016   Benign neoplasm of colon 09/20/2016   Overactive bladder 09/09/2015   Encounter for general adult medical examination without abnormal findings 09/03/2015   Idiopathic scoliosis and kyphoscoliosis 05/28/2015   Low back pain 05/28/2015   Insomnia 11/24/2009   Acute stress reaction 11/21/2009   Gastro-esophageal reflux disease without esophagitis 11/21/2009   Past Medical History:  Diagnosis Date    Anginal pain 1989   Anxiety    Arthritis    Atherosclerosis    Atrial fibrillation (HCC)    Coronary artery disease    Dysrhythmia    a-fib   Elevated liver enzymes    GERD (gastroesophageal reflux disease)    Heart attack (HCC) 2020   Heart disease    High cholesterol    Hypertension    NSTEMI (non-ST elevated myocardial infarction) (HCC) 07/09/2019   Family History  Problem Relation Age of Onset   Uterine cancer Mother    Hypertension Brother    Heart disease Brother    Breast cancer Cousin    Past Surgical History:  Procedure Laterality Date   ABDOMINAL HYSTERECTOMY  1983   for endometriosis with Burch   ANGIOPLASTY     ANGIOPLASTY     BACK SURGERY     1990   CARDIAC SURGERY     Catheterization   CORONARY STENT INTERVENTION N/A 07/09/2019   Procedure: CORONARY STENT INTERVENTION;  Surgeon: Ladona Heinz, MD;  Location: [facility];  Service: Cardiovascular;  Laterality: N/A;   LEFT HEART CATH AND CORONARY ANGIOGRAPHY N/A 07/09/2019   Procedure: LEFT HEART CATH AND CORONARY ANGIOGRAPHY and possible intervention;  Surgeon: Ladona Heinz, MD;  Location: [facility]  INVASIVE CV LAB;  Service: Cardiovascular;  Laterality: N/A;  4: or 4:30 PM today   LUMBAR LAMINECTOMY/DECOMPRESSION MICRODISCECTOMY Left 02/02/2022   Procedure: Left Lumbar two-three, Lumbar five-Sacral one Sublaminar decompression;  Surgeon: Colon Shove, MD;  Location: [facility] OR;  Service: Neurosurgery;  Laterality: Left;   NECK SURGERY     TOTAL KNEE ARTHROPLASTY Left 10/07/2020   Procedure: TOTAL KNEE ARTHROPLASTY;  Surgeon: Ernie Cough, MD;  Location: [facility];  Service: Orthopedics;  Laterality: Left;  70 mins   TOTAL KNEE REVISION Left 05/13/2022   Procedure: SYNOVECTOMY AND LEFT KNEE POLY-LINER EXCHANGE;  Surgeon: Vernetta Lonni GRADE, MD;  Location: [facility] OR;  Service: Orthopedics;  Laterality: Left;   Social History   Occupational History   Not on file  Tobacco Use   Smoking status: Never   Smokeless tobacco:  Never  Vaping Use   Vaping status: Never Used  Substance and Sexual Activity   Alcohol  use: Not Currently    Alcohol /week: 3.0 standard drinks of alcohol     Types: 3 Glasses of wine per week    Comment: I havent in several months 05/06/22   Drug use: No   Sexual activity: Not Currently    Birth control/protection: Post-menopausal    Comment: 1st intercourse 31 yo-1 partners

## 2024-10-26 NOTE — Telephone Encounter (Signed)
 1. Which medications need to be refilled? (please list name of each medication and dose if known) apixaban  (ELIQUIS ) 2.5 MG TABS tablet      2. Would you like to learn more about the convenience, safety, & potential cost savings by using the [facility]? no     3. Are you open to using the [facility] (Type [facility]. no     4. Which pharmacy/location (including street and city if local pharmacy) is medication to be sent to?    [id number] - [place], [place] - [address]   5. Do they need a 30 day or 90 day supply? 90 day  Pt is completely out.

## 2024-10-26 NOTE — Telephone Encounter (Signed)
 Eliquis  2.5mg  refill request received. Patient is 37 ears old, weight-57.5kg, Crea-0.85 on 01/31/24, Diagnosis-afib, and last seen by Dr. Ladona on 06/14/24. Dose is appropriate based on dosing criteria. Will send in refill to requested pharmacy.

## 2024-10-26 NOTE — Progress Notes (Signed)
 Patient says that she is feeling a bit better after her first shockwave treatment. She denies any soreness or tenderness after the first appointment, and says that the tightness is the knee seems to have improved some. She has been taking Tramadol  for her knee, and is asking whether she should decrease that for a better idea of her degree of improvement from the shockwave therapy.

## 2024-10-29 ENCOUNTER — Encounter: Payer: Self-pay | Admitting: Physical Therapy

## 2024-10-29 ENCOUNTER — Ambulatory Visit: Admitting: Physical Therapy

## 2024-10-29 ENCOUNTER — Other Ambulatory Visit: Payer: Self-pay

## 2024-10-29 DIAGNOSIS — R600 Localized edema: Secondary | ICD-10-CM | POA: Insufficient documentation

## 2024-10-29 DIAGNOSIS — M6281 Muscle weakness (generalized): Secondary | ICD-10-CM | POA: Insufficient documentation

## 2024-10-29 DIAGNOSIS — M25511 Pain in right shoulder: Secondary | ICD-10-CM | POA: Diagnosis present

## 2024-10-29 NOTE — Therapy (Addendum)
 OUTPATIENT PHYSICAL THERAPY SHOULDER EVALUATION   Patient Name: Erica Frank MRN: [id number] DOB:24-Nov-1941, 83 y.o., female Today's Date: 10/29/2024   PT End of Session - 10/29/24 1231     Visit Number 1    Number of Visits --   1-2x/week   Authorization Type [employer] MCR    PT Start Time 1145    PT Stop Time 1220    PT Time Calculation (min) 35 min          Past Medical History:  Diagnosis Date   Anginal pain 1989   Anxiety    Arthritis    Atherosclerosis    Atrial fibrillation (HCC)    Coronary artery disease    Dysrhythmia    a-fib   Elevated liver enzymes    GERD (gastroesophageal reflux disease)    Heart attack (HCC) 2020   Heart disease    High cholesterol    Hypertension    NSTEMI (non-ST elevated myocardial infarction) (HCC) 07/09/2019   Past Surgical History:  Procedure Laterality Date   ABDOMINAL HYSTERECTOMY  1983   for endometriosis with Burch   ANGIOPLASTY     ANGIOPLASTY     BACK SURGERY     1990   CARDIAC SURGERY     Catheterization   CORONARY STENT INTERVENTION N/A 07/09/2019   Procedure: CORONARY STENT INTERVENTION;  Surgeon: Ladona Heinz, MD;  Location: [facility];  Service: Cardiovascular;  Laterality: N/A;   LEFT HEART CATH AND CORONARY ANGIOGRAPHY N/A 07/09/2019   Procedure: LEFT HEART CATH AND CORONARY ANGIOGRAPHY and possible intervention;  Surgeon: Ladona Heinz, MD;  Location: [facility];  Service: Cardiovascular;  Laterality: N/A;  4: or 4:30 PM today   LUMBAR LAMINECTOMY/DECOMPRESSION MICRODISCECTOMY Left 02/02/2022   Procedure: Left Lumbar two-three, Lumbar five-Sacral one Sublaminar decompression;  Surgeon: Colon Shove, MD;  Location: [facility];  Service: Neurosurgery;  Laterality: Left;   NECK SURGERY     TOTAL KNEE ARTHROPLASTY Left 10/07/2020   Procedure: TOTAL KNEE ARTHROPLASTY;  Surgeon: Ernie Cough, MD;  Location: [facility];  Service: Orthopedics;  Laterality: Left;  70 mins   TOTAL KNEE REVISION Left 05/13/2022    Procedure: SYNOVECTOMY AND LEFT KNEE POLY-LINER EXCHANGE;  Surgeon: Vernetta Lonni GRADE, MD;  Location: [facility];  Service: Orthopedics;  Laterality: Left;   Patient Active Problem List   Diagnosis Date Noted   Altered bowel function 12/19/2023   Anal fissure 12/19/2023   Bilateral carotid artery stenosis 12/19/2023   Diverticulitis 12/19/2023   Generalized abdominal pain 12/19/2023   Blood per rectum 12/19/2023   History of colonic polyps 12/19/2023   Anticoagulated 12/19/2023   Nausea 12/19/2023   Acute pain of left knee 12/19/2023   Pinched nerve in neck 12/19/2023   Positive fecal immunochemical test 12/19/2023   Status post revision of total replacement of left knee 05/13/2022   Trigger middle finger of left hand 03/05/2022   Degenerative lumbar spinal stenosis 02/02/2022   Ischial bursitis 12/21/2021   Pain of left hip joint 12/21/2021   Swelling of right foot 08/27/2021   S/P left TKA 10/07/2020   Left knee OA 10/07/2020   Acquired trigger finger of left middle finger 04/22/2020   Elevated blood-pressure reading, without diagnosis of hypertension 03/21/2020   Syncope 01/16/2020   Anemia 01/16/2020   Thrombocytopenia 01/16/2020   Stool color abnormal 12/04/2019   Gout 11/28/2019   Cervical myelopathy (HCC) 11/23/2019   Neck pain 11/09/2019   Hypertensive heart disease without congestive heart failure  08/02/2019   Residual hemorrhoidal skin tags 08/02/2019   Fatigue 06/21/2019   Paroxysmal atrial fibrillation (HCC) 02/15/2019   Mixed hyperlipidemia 02/15/2019   Bradycardia 02/15/2019   Coronary artery disease involving native coronary artery of native heart with unstable angina pectoris (HCC) 12/20/2018   Dyslipidemia 12/20/2018   Essential hypertension 12/20/2018   Affective psychosis 09/20/2016   Benign neoplasm of colon 09/20/2016   Overactive bladder 09/09/2015   Encounter for general adult medical examination without abnormal findings 09/03/2015   Idiopathic  scoliosis and kyphoscoliosis 05/28/2015   Low back pain 05/28/2015   Insomnia 11/24/2009   Acute stress reaction 11/21/2009   Gastro-esophageal reflux disease without esophagitis 11/21/2009    PCP: Janey Santos, MD  REFERRING PROVIDER: Jilda Krystal HERO, DO  THERAPY DIAG:  Acute pain of right shoulder - Plan: PT plan of care cert/re-cert  Muscle weakness (generalized) - Plan: PT plan of care cert/re-cert  Localized edema - Plan: PT plan of care cert/re-cert  REFERRING DIAG: Unspecified rotator cuff tear or rupture of right shoulder, not specified as traumatic [M75.101]  Rationale for Evaluation and Treatment:  Rehabilitation  SUBJECTIVE:  PERTINENT PAST HISTORY:  CAD, cervical myelopathy, L knee pain, Hx of NSTEMI      PRECAUTIONS: None  WEIGHT BEARING RESTRICTIONS No  FALLS:  Has patient fallen in last 6 months? No, Number of falls: 0  MOI/History of condition:  Onset date: Chronic   SUBJECTIVE STATEMENT  Pt is a 83 y.o. female who presents to clinic with chief complaint of bil shoulder pain R>L.  Was told a couple of years ago that she had torn R/C but never had surgery.  Had a recent x-ray that showed some arthritis.  Reaching and throwing are difficult.  Disrupts sleep at times.  Feels her shoulders are weak.    Pain:  Are you having pain? Yes Pain location: R>L surgery NPRS scale:  Best: 1/10, Worst: 9/10 Aggravating factors: reaching, throwing Relieving factors: rest Pain description: sharp  Occupation: NA  Assistive Device: NA  Hand Dominance: R  Patient Goals/Specific Activities: reduce pain   OBJECTIVE:   DIAGNOSTIC FINDINGS:  Pt reports x-ray showed some arthritis   GENERAL OBSERVATION: Rounded shoulders     SENSATION: Light touch: Appears intact    UPPER EXTREMITY AROM:  ROM Right (Eval) Left (Eval)  Shoulder flexion 90* 110*  Shoulder abduction    Shoulder internal rotation    Shoulder external rotation    Functional IR  L2* L1*  Functional ER C8* C9*  Shoulder extension    Elbow extension    Elbow flexion     (Blank rows = not tested, N = WNL, * = concordant pain with testing)  UPPER EXTREMITY MMT:  MMT Right (Eval) Left (Eval)  Shoulder flexion 3** 3*  Shoulder abduction (C5)    Shoulder ER 4 4  Shoulder IR 4+ 4+  Middle trapezius    Lower trapezius    Shoulder extension    Grip strength    Cervical flexion (C1,C2)    Cervical S/B (C3)    Shoulder shrug (C4)    Elbow flexion (C6)    Elbow ext (C7)    Thumb ext (C8)    Finger abd (T1)    Grossly     (Blank rows = not tested, score listed is out of 5 possible points.  N = WNL, D = diminished, C = clear for gross weakness with myotome testing, * = concordant pain with testing)  UPPER EXTREMITY PROM:  PROM Right (Eval) Left (Eval)  Shoulder flexion    Shoulder abduction    Shoulder internal rotation    Shoulder external rotation    Functional IR    Functional ER    Shoulder extension    Elbow extension    Elbow flexion     (Blank rows = not tested, N = WNL, * = concordant pain with testing)   PATIENT SURVEYS:  QuickDASH Score: 40.9 / 100 = 40.9 %  HOME EXERCISE PROGRAM: Access Code: [email or web address] URL: [email or web address] Date: 10/29/2024 Prepared by: Helene Gasmen  Exercises - Seated Scapular Retraction  - 1 x daily - 7 x weekly - 2 sets - 10 reps - 5 hold - Supine Shoulder Horizontal Abduction with Resistance  - 1 x daily - 7 x weekly - 2 sets - 15-30 reps - Shoulder External Rotation and Scapular Retraction with Resistance  - 1 x daily - 7 x weekly - 3 sets - 10 reps - Doorway Pec Stretch at 90 Degrees Abduction  - 1 x daily - 7 x weekly - 3 reps - 45 second hold  Treatment priorities   Eval        Anterior chest stretching        R/C shoulder girdle strength        Exercising posture (open chest relieving to OH movement)                         TODAY'S TREATMENT:  Therapeutic Exercise:  Creating, reviewing, and completing HEP   PATIENT EDUCATION ([place]/[facility]):  POC, diagnosis, prognosis, HEP, and outcome measures.  Pt educated via explanation, demonstration, and handout (HEP).  Pt confirms understanding verbally.   ASSESSMENT:  CLINICAL IMPRESSION: Naomy is a 83 y.o. female who presents to clinic with signs and sxs consistent with R>L shoulder pain.  Consistent with SAPS.   May be some element of arthritis.  Postural SMP relieving.   Vicy will benefit from skilled PT to address relevant deficits and improve comfort with daily tasks.   OBJECTIVE IMPAIRMENTS: Pain, bil shoulder ROM, bil shoulder strength, posture  ACTIVITY LIMITATIONS: reaching, housework, lifting, sleeping  PERSONAL FACTORS: See medical history and pertinent history   REHAB POTENTIAL: Good  CLINICAL DECISION MAKING: Evolving/moderate complexity  EVALUATION COMPLEXITY: Moderate   GOALS:   SHORT TERM GOALS: Target date: 11/26/2024   Jaleisa will be >75% HEP compliant to improve carryover between sessions and facilitate independent management of condition  Evaluation: ongoing Goal status: INITIAL   LONG TERM GOALS: Target date: 12/24/2024   Sutton will self report >/= 50% decrease in pain from evaluation to improve function in daily tasks  Evaluation/Baseline: 9/10 max pain Goal status: INITIAL   2.  Livi will show a >/= 12 pt improvement in her QUICK DASH score (MCID is 10% or ~5 pts) as a proxy for functional improvement   Evaluation/Baseline: QuickDASH Score: 40.9 / 100 = 40.9 % Goal status: INITIAL   3.  Jasmane will be able to reaching into [facility] cabinet, not limited by pain  Evaluation/Baseline: limited Goal status: INITIAL    PLAN: PT FREQUENCY: 1-2x/week  PT DURATION: 8 weeks  PLANNED INTERVENTIONS:  [id number]- PT Re-evaluation, 97110-Therapeutic exercises, 97530- Therapeutic activity, 97112- Neuromuscular re-education, 97535- Self Care, [id number]- Manual therapy, [id number]- Gait training,  [id number]- Aquatic Therapy, [phone]- Electrical stimulation (manual), [id number]- Vasopneumatic device, [id number]- Traction (mechanical), [id number]- Ionotophoresis 4mg /ml Dexamethasone , Taping, Dry Needling, Joint manipulation, and Spinal manipulation.   Kharizma Lesnick E Marisel Tostenson  PT 10/29/2024, 12:33 PM

## 2024-10-30 NOTE — Telephone Encounter (Signed)
 Medication refilled on 10/26/2024.

## 2024-11-06 ENCOUNTER — Encounter: Admitting: Physical Therapy

## 2024-11-07 ENCOUNTER — Ambulatory Visit: Admitting: Sports Medicine

## 2024-11-07 ENCOUNTER — Encounter: Payer: Self-pay | Admitting: Sports Medicine

## 2024-11-07 DIAGNOSIS — G8929 Other chronic pain: Secondary | ICD-10-CM

## 2024-11-07 DIAGNOSIS — L905 Scar conditions and fibrosis of skin: Secondary | ICD-10-CM

## 2024-11-07 DIAGNOSIS — Z96652 Presence of left artificial knee joint: Secondary | ICD-10-CM

## 2024-11-07 DIAGNOSIS — M25562 Pain in left knee: Secondary | ICD-10-CM

## 2024-11-07 NOTE — Progress Notes (Signed)
   Procedure Note  Patient: Erica Frank             Date of Birth: 30-Sep-1941           MRN: [id number]             Visit Date: 11/07/2024  So far to date we have performed 2 treatments of extracorporeal shockwave therapy for chronic left knee pain status post TKA to help with scar tissue and motion. ECSWT #3 today.  Procedures: Visit Diagnoses:  1. Chronic pain of left knee   2. Hx of total knee arthroplasty, left   3. Scar tissue    Procedure: ECSWT Indications:  Scar tissue, Knee Pain s/p TKA   Procedure Details Consent: Risks of procedure as well as the alternatives and risks of each were explained to the patient.  Verbal consent for procedure obtained. Time Out: Verified patient identification, verified procedure, site was marked, verified correct patient position. The area was cleaned with alcohol  swab.     The Left Knee (soft tissue/scar tissue)  was targeted for Extracorporeal shockwave therapy.    Preset: Muscle/Scar Tissue Power Level: 90-110 mJ Frequency: 9-12Hz    Impulse/cycles: 2000 anteriorly; 800 posteriorly Head size: Regular   Patient tolerated procedure well without immediate complications.   - Will follow-up in about 1 week for an additional treatment, at that point we will reevaluate her degree of improvement  Lonell Sprang, DO Primary Care Sports Medicine Physician  [facility] - Orthopedics  This note was dictated using Dragon naturally speaking software and may contain errors in syntax, spelling, or content which have not been identified prior to signing this note.

## 2024-11-07 NOTE — Progress Notes (Signed)
 Patient says that she feels she has been walking a bit better, but is unsure how much benefit she has gotten from the shockwave therapy. She would like to repeat treatment today, and do one more after today, to see how much benefit she gets. She has continued taking her Tramadol , but would like to try going without it for a couple of days next week. She does wake up around 3am with pain, and takes her Tramadol  then, which typically gives her relief until the evening.

## 2024-11-13 ENCOUNTER — Ambulatory Visit: Admitting: Physical Therapy

## 2024-11-16 ENCOUNTER — Ambulatory Visit

## 2024-11-20 ENCOUNTER — Ambulatory Visit: Admitting: Sports Medicine

## 2024-11-20 ENCOUNTER — Encounter: Payer: Self-pay | Admitting: Sports Medicine

## 2024-11-20 DIAGNOSIS — G8929 Other chronic pain: Secondary | ICD-10-CM

## 2024-11-20 DIAGNOSIS — Z96652 Presence of left artificial knee joint: Secondary | ICD-10-CM

## 2024-11-20 DIAGNOSIS — L905 Scar conditions and fibrosis of skin: Secondary | ICD-10-CM

## 2024-11-20 NOTE — Progress Notes (Signed)
 Patient is having much more stiffness and discomfort today, which she says typically happens with the colder weather. She does notice improvements in her symptoms right after the shockwave treatment, but says that these improvements are gone by the time she gets home after her appointments. She has otherwise not noticed any other changes.

## 2024-11-20 NOTE — Progress Notes (Signed)
° °  Procedure Note  Patient: Erica Frank             Date of Birth: [birth year]           MRN: [id number]             Visit Date: 11/20/2024  So far to date we have performed 3 treatments of extracorporeal shockwave therapy for chronic left knee pain status post TKA to help with scar tissue and motion. ECSWT #4 today.   Procedures: Visit Diagnoses:  1. Chronic pain of left knee   2. Hx of total knee arthroplasty, left   3. Scar tissue    Procedure: ECSWT Indications:  Scar tissue, Knee Pain s/p TKA   Procedure Details Consent: Risks of procedure as well as the alternatives and risks of each were explained to the patient.  Verbal consent for procedure obtained. Time Out: Verified patient identification, verified procedure, site was marked, verified correct patient position. The area was cleaned with alcohol  swab.     The Left Knee (soft tissue/scar tissue)  was targeted for Extracorporeal shockwave therapy.    Preset: Muscle/Scar Tissue Power Level: 90-110 mJ Frequency: 9-11Hz    Impulse/cycles: 1600 anteriorly; 1000 posteriorly Head size: Regular   Patient tolerated procedure well without immediate complications.  - Has only noticed improvements for 1 day and then pain returns.  Given this, we will leave follow-up on an as-needed basis  Lonell Sprang, DO Primary Care Sports Medicine Physician  [facility] - Orthopedics  This note was dictated using Dragon naturally speaking software and may contain errors in syntax, spelling, or content which have not been identified prior to signing this note.

## 2024-11-25 ENCOUNTER — Other Ambulatory Visit: Payer: Self-pay | Admitting: Cardiology

## 2024-11-25 DIAGNOSIS — I25118 Atherosclerotic heart disease of native coronary artery with other forms of angina pectoris: Secondary | ICD-10-CM

## 2024-11-25 DIAGNOSIS — E7800 Pure hypercholesterolemia, unspecified: Secondary | ICD-10-CM

## 2024-12-26 ENCOUNTER — Other Ambulatory Visit: Payer: Self-pay | Admitting: Cardiology

## 2024-12-26 DIAGNOSIS — I480 Paroxysmal atrial fibrillation: Secondary | ICD-10-CM
